# Patient Record
Sex: Female | Born: 1945
Health system: Southern US, Community
[De-identification: ages and names within clinical notes are randomized; demographics above are authoritative.]

## PROBLEM LIST (undated history)

## (undated) DIAGNOSIS — N6019 Diffuse cystic mastopathy of unspecified breast: Secondary | ICD-10-CM

## (undated) DIAGNOSIS — M25512 Pain in left shoulder: Secondary | ICD-10-CM

## (undated) DIAGNOSIS — E785 Hyperlipidemia, unspecified: Secondary | ICD-10-CM

## (undated) DIAGNOSIS — R221 Localized swelling, mass and lump, neck: Secondary | ICD-10-CM

## (undated) DIAGNOSIS — F32A Depression, unspecified: Secondary | ICD-10-CM

## (undated) DIAGNOSIS — K5730 Diverticulosis of large intestine without perforation or abscess without bleeding: Secondary | ICD-10-CM

## (undated) DIAGNOSIS — F329 Major depressive disorder, single episode, unspecified: Secondary | ICD-10-CM

## (undated) DIAGNOSIS — Z86711 Personal history of pulmonary embolism: Secondary | ICD-10-CM

## (undated) DIAGNOSIS — I10 Essential (primary) hypertension: Secondary | ICD-10-CM

## (undated) DIAGNOSIS — Z8619 Personal history of other infectious and parasitic diseases: Secondary | ICD-10-CM

## (undated) DIAGNOSIS — M1990 Unspecified osteoarthritis, unspecified site: Secondary | ICD-10-CM

## (undated) DIAGNOSIS — G4733 Obstructive sleep apnea (adult) (pediatric): Secondary | ICD-10-CM

## (undated) DIAGNOSIS — J9611 Chronic respiratory failure with hypoxia: Secondary | ICD-10-CM

## (undated) DIAGNOSIS — R911 Solitary pulmonary nodule: Secondary | ICD-10-CM

## (undated) DIAGNOSIS — N189 Chronic kidney disease, unspecified: Secondary | ICD-10-CM

## (undated) DIAGNOSIS — Z9981 Dependence on supplemental oxygen: Secondary | ICD-10-CM

## (undated) DIAGNOSIS — E669 Obesity, unspecified: Secondary | ICD-10-CM

## (undated) HISTORY — DX: Localized swelling, mass and lump, neck: R22.1

## (undated) HISTORY — DX: Chronic respiratory failure with hypoxia: J96.11

## (undated) HISTORY — DX: Solitary pulmonary nodule: R91.1

## (undated) HISTORY — DX: Dependence on supplemental oxygen: Z99.81

## (undated) HISTORY — DX: Obesity, unspecified: E66.9

## (undated) HISTORY — DX: Pain in left shoulder: M25.512

## (undated) HISTORY — DX: Chronic kidney disease, unspecified: N18.9

## (undated) HISTORY — DX: Personal history of pulmonary embolism: Z86.711

## (undated) HISTORY — PX: OTHER SURGICAL HISTORY: SHX169

## (undated) HISTORY — DX: Obstructive sleep apnea (adult) (pediatric): G47.33

## (undated) HISTORY — DX: Diverticulosis of large intestine without perforation or abscess without bleeding: K57.30

## (undated) HISTORY — DX: Essential (primary) hypertension: I10

## (undated) HISTORY — DX: Hyperlipidemia, unspecified: E78.5

## (undated) HISTORY — DX: Personal history of other infectious and parasitic diseases: Z86.19

## (undated) HISTORY — DX: Unspecified osteoarthritis, unspecified site: M19.90

## (undated) HISTORY — DX: Depression, unspecified: F32.A

## (undated) HISTORY — DX: Diffuse cystic mastopathy of unspecified breast: N60.19

## (undated) HISTORY — DX: Major depressive disorder, single episode, unspecified: F32.9

## (undated) SURGERY — SIGMOIDOSCOPY, FLEXIBLE

---

## 1997-09-28 ENCOUNTER — Other Ambulatory Visit: Admission: RE | Admit: 1997-09-28 | Discharge: 1997-09-28 | Payer: Self-pay | Admitting: *Deleted

## 1997-12-31 ENCOUNTER — Encounter: Payer: Self-pay | Admitting: *Deleted

## 1997-12-31 ENCOUNTER — Inpatient Hospital Stay (HOSPITAL_COMMUNITY): Admission: EM | Admit: 1997-12-31 | Discharge: 1998-01-02 | Payer: Self-pay | Admitting: Emergency Medicine

## 1998-01-26 ENCOUNTER — Emergency Department (HOSPITAL_COMMUNITY): Admission: EM | Admit: 1998-01-26 | Discharge: 1998-01-26 | Payer: Self-pay | Admitting: *Deleted

## 1998-01-26 ENCOUNTER — Encounter: Payer: Self-pay | Admitting: *Deleted

## 1998-04-30 ENCOUNTER — Ambulatory Visit (HOSPITAL_COMMUNITY): Admission: RE | Admit: 1998-04-30 | Discharge: 1998-04-30 | Payer: Self-pay | Admitting: *Deleted

## 1999-12-11 ENCOUNTER — Ambulatory Visit (HOSPITAL_COMMUNITY): Admission: RE | Admit: 1999-12-11 | Discharge: 1999-12-11 | Payer: Self-pay | Admitting: *Deleted

## 2001-10-28 ENCOUNTER — Inpatient Hospital Stay (HOSPITAL_COMMUNITY): Admission: EM | Admit: 2001-10-28 | Discharge: 2001-11-04 | Payer: Self-pay | Admitting: *Deleted

## 2001-11-05 ENCOUNTER — Other Ambulatory Visit (HOSPITAL_COMMUNITY): Admission: RE | Admit: 2001-11-05 | Discharge: 2001-11-10 | Payer: Self-pay | Admitting: *Deleted

## 2002-01-01 DIAGNOSIS — Z86711 Personal history of pulmonary embolism: Secondary | ICD-10-CM

## 2002-01-01 HISTORY — DX: Personal history of pulmonary embolism: Z86.711

## 2002-01-30 ENCOUNTER — Encounter: Payer: Self-pay | Admitting: Cardiology

## 2002-01-30 ENCOUNTER — Inpatient Hospital Stay (HOSPITAL_COMMUNITY): Admission: EM | Admit: 2002-01-30 | Discharge: 2002-02-08 | Payer: Self-pay

## 2002-02-01 ENCOUNTER — Encounter: Payer: Self-pay | Admitting: Internal Medicine

## 2002-02-02 ENCOUNTER — Encounter (INDEPENDENT_AMBULATORY_CARE_PROVIDER_SITE_OTHER): Payer: Self-pay | Admitting: Cardiology

## 2002-02-02 ENCOUNTER — Encounter: Payer: Self-pay | Admitting: Internal Medicine

## 2002-03-08 ENCOUNTER — Encounter: Admission: RE | Admit: 2002-03-08 | Discharge: 2002-06-06 | Payer: Self-pay | Admitting: Internal Medicine

## 2003-06-05 ENCOUNTER — Encounter: Admission: RE | Admit: 2003-06-05 | Discharge: 2003-06-05 | Payer: Self-pay | Admitting: Internal Medicine

## 2003-06-05 ENCOUNTER — Ambulatory Visit (HOSPITAL_COMMUNITY): Admission: RE | Admit: 2003-06-05 | Discharge: 2003-06-05 | Payer: Self-pay | Admitting: Internal Medicine

## 2003-06-12 ENCOUNTER — Encounter: Admission: RE | Admit: 2003-06-12 | Discharge: 2003-06-12 | Payer: Self-pay | Admitting: Internal Medicine

## 2003-06-26 ENCOUNTER — Encounter: Admission: RE | Admit: 2003-06-26 | Discharge: 2003-06-26 | Payer: Self-pay | Admitting: Internal Medicine

## 2003-07-17 ENCOUNTER — Encounter: Admission: RE | Admit: 2003-07-17 | Discharge: 2003-07-17 | Payer: Self-pay | Admitting: Internal Medicine

## 2003-08-14 ENCOUNTER — Encounter: Admission: RE | Admit: 2003-08-14 | Discharge: 2003-08-14 | Payer: Self-pay | Admitting: Internal Medicine

## 2003-09-07 ENCOUNTER — Encounter: Admission: RE | Admit: 2003-09-07 | Discharge: 2003-09-07 | Payer: Self-pay | Admitting: Internal Medicine

## 2003-10-05 ENCOUNTER — Encounter: Admission: RE | Admit: 2003-10-05 | Discharge: 2003-10-05 | Payer: Self-pay | Admitting: Internal Medicine

## 2003-10-25 ENCOUNTER — Encounter: Admission: RE | Admit: 2003-10-25 | Discharge: 2003-10-25 | Payer: Self-pay | Admitting: Internal Medicine

## 2003-11-10 ENCOUNTER — Ambulatory Visit: Payer: Self-pay | Admitting: Internal Medicine

## 2003-11-13 ENCOUNTER — Ambulatory Visit: Payer: Self-pay | Admitting: Internal Medicine

## 2003-11-15 ENCOUNTER — Ambulatory Visit: Payer: Self-pay | Admitting: Internal Medicine

## 2003-12-19 ENCOUNTER — Ambulatory Visit: Payer: Self-pay | Admitting: Internal Medicine

## 2004-01-15 ENCOUNTER — Ambulatory Visit: Payer: Self-pay | Admitting: Internal Medicine

## 2004-01-29 ENCOUNTER — Ambulatory Visit: Payer: Self-pay | Admitting: Internal Medicine

## 2004-03-18 ENCOUNTER — Ambulatory Visit: Payer: Self-pay | Admitting: Internal Medicine

## 2004-04-08 ENCOUNTER — Ambulatory Visit: Payer: Self-pay | Admitting: Internal Medicine

## 2004-05-06 ENCOUNTER — Ambulatory Visit: Payer: Self-pay | Admitting: Internal Medicine

## 2004-09-06 ENCOUNTER — Ambulatory Visit: Payer: Self-pay | Admitting: Internal Medicine

## 2004-09-20 ENCOUNTER — Ambulatory Visit: Payer: Self-pay | Admitting: Internal Medicine

## 2004-10-18 ENCOUNTER — Ambulatory Visit (HOSPITAL_COMMUNITY): Admission: RE | Admit: 2004-10-18 | Discharge: 2004-10-18 | Payer: Self-pay | Admitting: Obstetrics & Gynecology

## 2004-10-18 ENCOUNTER — Encounter (INDEPENDENT_AMBULATORY_CARE_PROVIDER_SITE_OTHER): Payer: Self-pay | Admitting: *Deleted

## 2004-11-06 ENCOUNTER — Ambulatory Visit (HOSPITAL_COMMUNITY): Admission: RE | Admit: 2004-11-06 | Discharge: 2004-11-06 | Payer: Self-pay | Admitting: Gastroenterology

## 2004-11-06 LAB — HM COLONOSCOPY

## 2005-04-11 ENCOUNTER — Emergency Department (HOSPITAL_COMMUNITY): Admission: EM | Admit: 2005-04-11 | Discharge: 2005-04-11 | Payer: Self-pay | Admitting: Emergency Medicine

## 2005-09-01 ENCOUNTER — Other Ambulatory Visit (HOSPITAL_COMMUNITY): Admission: RE | Admit: 2005-09-01 | Discharge: 2005-09-15 | Payer: Self-pay | Admitting: Psychiatry

## 2005-09-01 ENCOUNTER — Ambulatory Visit: Payer: Self-pay | Admitting: Psychiatry

## 2005-10-14 ENCOUNTER — Ambulatory Visit: Payer: Self-pay | Admitting: Internal Medicine

## 2005-10-22 ENCOUNTER — Ambulatory Visit: Payer: Self-pay | Admitting: Internal Medicine

## 2005-10-22 ENCOUNTER — Encounter (INDEPENDENT_AMBULATORY_CARE_PROVIDER_SITE_OTHER): Payer: Self-pay | Admitting: *Deleted

## 2006-01-19 DIAGNOSIS — I10 Essential (primary) hypertension: Secondary | ICD-10-CM | POA: Insufficient documentation

## 2006-01-19 DIAGNOSIS — Z8679 Personal history of other diseases of the circulatory system: Secondary | ICD-10-CM | POA: Insufficient documentation

## 2006-01-19 DIAGNOSIS — F329 Major depressive disorder, single episode, unspecified: Secondary | ICD-10-CM

## 2006-01-19 DIAGNOSIS — G4733 Obstructive sleep apnea (adult) (pediatric): Secondary | ICD-10-CM | POA: Insufficient documentation

## 2006-01-19 DIAGNOSIS — K5730 Diverticulosis of large intestine without perforation or abscess without bleeding: Secondary | ICD-10-CM | POA: Insufficient documentation

## 2006-01-19 DIAGNOSIS — Z6841 Body Mass Index (BMI) 40.0 and over, adult: Secondary | ICD-10-CM

## 2006-01-19 DIAGNOSIS — Z86718 Personal history of other venous thrombosis and embolism: Secondary | ICD-10-CM | POA: Insufficient documentation

## 2006-01-19 DIAGNOSIS — Z8619 Personal history of other infectious and parasitic diseases: Secondary | ICD-10-CM

## 2006-01-19 DIAGNOSIS — M1710 Unilateral primary osteoarthritis, unspecified knee: Secondary | ICD-10-CM

## 2006-01-19 DIAGNOSIS — N6019 Diffuse cystic mastopathy of unspecified breast: Secondary | ICD-10-CM

## 2006-01-19 HISTORY — DX: Morbid (severe) obesity due to excess calories: E66.01

## 2006-01-19 HISTORY — DX: Body Mass Index (BMI) 40.0 and over, adult: Z684

## 2006-01-19 HISTORY — DX: Personal history of other diseases of the circulatory system: Z86.79

## 2006-03-07 DIAGNOSIS — N182 Chronic kidney disease, stage 2 (mild): Secondary | ICD-10-CM

## 2006-03-07 DIAGNOSIS — E785 Hyperlipidemia, unspecified: Secondary | ICD-10-CM

## 2006-06-05 DIAGNOSIS — Z9181 History of falling: Secondary | ICD-10-CM

## 2006-06-18 ENCOUNTER — Telehealth: Payer: Self-pay | Admitting: *Deleted

## 2006-07-03 ENCOUNTER — Encounter (INDEPENDENT_AMBULATORY_CARE_PROVIDER_SITE_OTHER): Payer: Self-pay | Admitting: Internal Medicine

## 2006-07-03 ENCOUNTER — Ambulatory Visit: Payer: Self-pay | Admitting: *Deleted

## 2006-07-03 LAB — CONVERTED CEMR LAB
BUN: 21 mg/dL (ref 6–23)
CO2: 26 meq/L (ref 19–32)
Calcium: 8.9 mg/dL (ref 8.4–10.5)
Chloride: 105 meq/L (ref 96–112)
Creatinine, Ser: 1.15 mg/dL (ref 0.40–1.20)
Glucose, Bld: 125 mg/dL — ABNORMAL HIGH (ref 70–99)
Potassium: 3.7 meq/L (ref 3.5–5.3)
Sodium: 141 meq/L (ref 135–145)

## 2006-07-20 ENCOUNTER — Telehealth (INDEPENDENT_AMBULATORY_CARE_PROVIDER_SITE_OTHER): Payer: Self-pay | Admitting: *Deleted

## 2006-08-05 ENCOUNTER — Encounter (INDEPENDENT_AMBULATORY_CARE_PROVIDER_SITE_OTHER): Payer: Self-pay | Admitting: *Deleted

## 2006-08-05 ENCOUNTER — Ambulatory Visit: Payer: Self-pay | Admitting: Internal Medicine

## 2006-08-06 LAB — CONVERTED CEMR LAB
BUN: 13 mg/dL (ref 6–23)
CO2: 27 meq/L (ref 19–32)
Calcium: 9.3 mg/dL (ref 8.4–10.5)
Chloride: 101 meq/L (ref 96–112)
Cholesterol: 239 mg/dL — ABNORMAL HIGH (ref 0–200)
Creatinine, Ser: 1.16 mg/dL (ref 0.40–1.20)
Glucose, Bld: 115 mg/dL — ABNORMAL HIGH (ref 70–99)
HDL: 45 mg/dL (ref >39)
LDL Cholesterol: 163 mg/dL — ABNORMAL HIGH (ref 0–99)
Potassium: 3.3 meq/L — ABNORMAL LOW (ref 3.5–5.3)
Sodium: 140 meq/L (ref 135–145)
Total CHOL/HDL Ratio: 5.3
Triglycerides: 153 mg/dL — ABNORMAL HIGH (ref <150)
VLDL: 31 mg/dL (ref 0–40)

## 2006-08-07 ENCOUNTER — Ambulatory Visit: Payer: Self-pay | Admitting: Internal Medicine

## 2006-08-07 ENCOUNTER — Encounter (INDEPENDENT_AMBULATORY_CARE_PROVIDER_SITE_OTHER): Payer: Self-pay | Admitting: *Deleted

## 2006-08-07 LAB — CONVERTED CEMR LAB
CO2: 26 meq/L (ref 19–32)
Calcium: 8.7 mg/dL (ref 8.4–10.5)
Chloride: 102 meq/L (ref 96–112)
Creatinine, Ser: 1.04 mg/dL (ref 0.40–1.20)
Glucose, Bld: 96 mg/dL (ref 70–99)
Potassium: 3.3 meq/L — ABNORMAL LOW (ref 3.5–5.3)
Sodium: 143 meq/L (ref 135–145)

## 2006-09-24 ENCOUNTER — Ambulatory Visit: Payer: Self-pay | Admitting: Internal Medicine

## 2006-09-24 ENCOUNTER — Encounter (INDEPENDENT_AMBULATORY_CARE_PROVIDER_SITE_OTHER): Payer: Self-pay | Admitting: *Deleted

## 2006-09-24 ENCOUNTER — Ambulatory Visit (HOSPITAL_COMMUNITY): Admission: RE | Admit: 2006-09-24 | Discharge: 2006-09-24 | Payer: Self-pay | Admitting: *Deleted

## 2006-09-25 ENCOUNTER — Encounter (INDEPENDENT_AMBULATORY_CARE_PROVIDER_SITE_OTHER): Payer: Self-pay | Admitting: *Deleted

## 2006-09-25 ENCOUNTER — Ambulatory Visit (HOSPITAL_COMMUNITY): Admission: RE | Admit: 2006-09-25 | Discharge: 2006-09-25 | Payer: Self-pay | Admitting: *Deleted

## 2006-09-25 LAB — CONVERTED CEMR LAB
ALT: 27 units/L (ref 0–35)
Albumin: 4.4 g/dL (ref 3.5–5.2)
Alkaline Phosphatase: 113 units/L (ref 39–117)
BUN: 17 mg/dL (ref 6–23)
CO2: 24 meq/L (ref 19–32)
Calcium: 9.5 mg/dL (ref 8.4–10.5)
Creatinine, Ser: 1.43 mg/dL — ABNORMAL HIGH (ref 0.40–1.20)
Glucose, Bld: 108 mg/dL — ABNORMAL HIGH (ref 70–99)
Potassium: 4.1 meq/L (ref 3.5–5.3)
Sodium: 137 meq/L (ref 135–145)
TSH: 1.549 microintl units/mL (ref 0.350–5.50)
Total Bilirubin: 0.6 mg/dL (ref 0.3–1.2)
Total Protein: 7.6 g/dL (ref 6.0–8.3)

## 2006-10-26 ENCOUNTER — Ambulatory Visit: Payer: Self-pay | Admitting: Internal Medicine

## 2006-10-26 DIAGNOSIS — K219 Gastro-esophageal reflux disease without esophagitis: Secondary | ICD-10-CM | POA: Insufficient documentation

## 2006-11-23 ENCOUNTER — Ambulatory Visit: Payer: Self-pay | Admitting: Internal Medicine

## 2006-11-23 ENCOUNTER — Encounter (INDEPENDENT_AMBULATORY_CARE_PROVIDER_SITE_OTHER): Payer: Self-pay | Admitting: *Deleted

## 2006-11-23 LAB — CONVERTED CEMR LAB
Cholesterol: 169 mg/dL (ref 0–200)
HDL: 43 mg/dL (ref >39)
LDL Cholesterol: 110 mg/dL — ABNORMAL HIGH (ref 0–99)
Total CHOL/HDL Ratio: 3.9
Triglycerides: 80 mg/dL (ref <150)
VLDL: 16 mg/dL (ref 0–40)

## 2006-12-09 ENCOUNTER — Ambulatory Visit (HOSPITAL_COMMUNITY): Admission: RE | Admit: 2006-12-09 | Discharge: 2006-12-09 | Payer: Self-pay | Admitting: Infectious Diseases

## 2006-12-09 ENCOUNTER — Ambulatory Visit: Payer: Self-pay | Admitting: Infectious Diseases

## 2006-12-09 DIAGNOSIS — R229 Localized swelling, mass and lump, unspecified: Secondary | ICD-10-CM

## 2006-12-09 DIAGNOSIS — J984 Other disorders of lung: Secondary | ICD-10-CM

## 2007-03-12 ENCOUNTER — Telehealth (INDEPENDENT_AMBULATORY_CARE_PROVIDER_SITE_OTHER): Payer: Self-pay | Admitting: *Deleted

## 2007-03-31 ENCOUNTER — Ambulatory Visit (HOSPITAL_COMMUNITY): Admission: RE | Admit: 2007-03-31 | Discharge: 2007-03-31 | Payer: Self-pay | Admitting: Internal Medicine

## 2007-04-05 ENCOUNTER — Telehealth (INDEPENDENT_AMBULATORY_CARE_PROVIDER_SITE_OTHER): Payer: Self-pay | Admitting: *Deleted

## 2008-04-06 ENCOUNTER — Telehealth (INDEPENDENT_AMBULATORY_CARE_PROVIDER_SITE_OTHER): Payer: Self-pay | Admitting: *Deleted

## 2008-04-06 ENCOUNTER — Encounter (INDEPENDENT_AMBULATORY_CARE_PROVIDER_SITE_OTHER): Payer: Self-pay | Admitting: *Deleted

## 2008-04-06 ENCOUNTER — Ambulatory Visit: Payer: Self-pay | Admitting: Internal Medicine

## 2008-04-06 DIAGNOSIS — N3941 Urge incontinence: Secondary | ICD-10-CM

## 2008-04-06 LAB — CONVERTED CEMR LAB
Glucose, Urine, Semiquant: NEGATIVE
Ketones, urine, test strip: NEGATIVE

## 2008-04-07 ENCOUNTER — Encounter (INDEPENDENT_AMBULATORY_CARE_PROVIDER_SITE_OTHER): Payer: Self-pay | Admitting: *Deleted

## 2008-04-10 LAB — CONVERTED CEMR LAB
ALT: 40 units/L — ABNORMAL HIGH (ref 0–35)
AST: 35 units/L (ref 0–37)
Albumin: 4.2 g/dL (ref 3.5–5.2)
Alkaline Phosphatase: 94 units/L (ref 39–117)
BUN: 13 mg/dL (ref 6–23)
Bilirubin Urine: NEGATIVE
CO2: 23 meq/L (ref 19–32)
Calcium: 9.2 mg/dL (ref 8.4–10.5)
Chloride: 99 meq/L (ref 96–112)
Creatinine, Ser: 1.06 mg/dL (ref 0.40–1.20)
Glucose, Bld: 108 mg/dL — ABNORMAL HIGH (ref 70–99)
HCT: 45.6 % (ref 36.0–46.0)
Hemoglobin, Urine: NEGATIVE
Hemoglobin: 14.6 g/dL (ref 12.0–15.0)
Leukocytes, UA: NEGATIVE
MCHC: 32 g/dL (ref 30.0–36.0)
MCV: 82.3 fL (ref 78.0–100.0)
Nitrite: NEGATIVE
Platelets: 263 10*3/uL (ref 150–400)
Potassium: 3.2 meq/L — ABNORMAL LOW (ref 3.5–5.3)
Protein, ur: 30 mg/dL — AB
RBC / HPF: NONE SEEN (ref <3)
RBC: 5.54 M/uL — ABNORMAL HIGH (ref 3.87–5.11)
RDW: 14.4 % (ref 11.5–15.5)
Sodium: 141 meq/L (ref 135–145)
Specific Gravity, Urine: 1.028 (ref 1.005–1.03)
TSH: 2.106 microintl units/mL (ref 0.350–4.50)
Total Bilirubin: 0.7 mg/dL (ref 0.3–1.2)
Total Protein: 7.8 g/dL (ref 6.0–8.3)
Urine Glucose: NEGATIVE mg/dL
Urobilinogen, UA: 1 (ref 0.0–1.0)
WBC: 7.4 10*3/uL (ref 4.0–10.5)
pH: 7 (ref 5.0–8.0)

## 2008-04-11 ENCOUNTER — Telehealth (INDEPENDENT_AMBULATORY_CARE_PROVIDER_SITE_OTHER): Payer: Self-pay | Admitting: *Deleted

## 2008-04-20 ENCOUNTER — Encounter (INDEPENDENT_AMBULATORY_CARE_PROVIDER_SITE_OTHER): Payer: Self-pay | Admitting: *Deleted

## 2008-04-20 ENCOUNTER — Ambulatory Visit: Payer: Self-pay | Admitting: *Deleted

## 2008-04-20 LAB — CONVERTED CEMR LAB
BUN: 15 mg/dL (ref 6–23)
Blood Glucose, Fingerstick: 130
CO2: 25 meq/L (ref 19–32)
Calcium: 9.2 mg/dL (ref 8.4–10.5)
Chloride: 105 meq/L (ref 96–112)
Cholesterol: 184 mg/dL (ref 0–200)
Creatinine, Ser: 1.22 mg/dL — ABNORMAL HIGH (ref 0.40–1.20)
Glucose, Bld: 113 mg/dL — ABNORMAL HIGH (ref 70–99)
HDL: 42 mg/dL (ref >39)
LDL Cholesterol: 123 mg/dL — ABNORMAL HIGH (ref 0–99)
Magnesium: 2 mg/dL (ref 1.5–2.5)
Potassium: 4.3 meq/L (ref 3.5–5.3)
Sodium: 142 meq/L (ref 135–145)
Total CHOL/HDL Ratio: 4.4
Triglycerides: 97 mg/dL (ref <150)
VLDL: 19 mg/dL (ref 0–40)

## 2008-04-24 ENCOUNTER — Ambulatory Visit (HOSPITAL_COMMUNITY): Admission: RE | Admit: 2008-04-24 | Discharge: 2008-04-24 | Payer: Self-pay | Admitting: *Deleted

## 2008-05-19 ENCOUNTER — Ambulatory Visit: Payer: Self-pay | Admitting: Internal Medicine

## 2008-05-19 ENCOUNTER — Encounter (INDEPENDENT_AMBULATORY_CARE_PROVIDER_SITE_OTHER): Payer: Self-pay | Admitting: *Deleted

## 2008-05-19 LAB — CONVERTED CEMR LAB
BUN: 12 mg/dL (ref 6–23)
CO2: 25 meq/L (ref 19–32)
Calcium: 9.6 mg/dL (ref 8.4–10.5)
Chloride: 104 meq/L (ref 96–112)
Creatinine, Ser: 1.22 mg/dL — ABNORMAL HIGH (ref 0.40–1.20)
Glucose, Bld: 103 mg/dL — ABNORMAL HIGH (ref 70–99)
Potassium: 4 meq/L (ref 3.5–5.3)
Sodium: 141 meq/L (ref 135–145)

## 2008-06-07 ENCOUNTER — Encounter: Payer: Self-pay | Admitting: Licensed Clinical Social Worker

## 2008-06-12 ENCOUNTER — Encounter: Payer: Self-pay | Admitting: Licensed Clinical Social Worker

## 2008-09-01 ENCOUNTER — Emergency Department (HOSPITAL_COMMUNITY): Admission: EM | Admit: 2008-09-01 | Discharge: 2008-09-01 | Payer: Self-pay | Admitting: Emergency Medicine

## 2008-09-19 ENCOUNTER — Telehealth: Payer: Self-pay | Admitting: *Deleted

## 2008-12-28 ENCOUNTER — Telehealth: Payer: Self-pay | Admitting: Internal Medicine

## 2009-01-15 ENCOUNTER — Ambulatory Visit: Payer: Self-pay | Admitting: Internal Medicine

## 2009-01-16 LAB — CONVERTED CEMR LAB
BUN: 12 mg/dL (ref 6–23)
CO2: 26 meq/L (ref 19–32)
Calcium: 9.4 mg/dL (ref 8.4–10.5)
Chloride: 102 meq/L (ref 96–112)
Creatinine, Ser: 1.12 mg/dL (ref 0.40–1.20)
Glucose, Bld: 94 mg/dL (ref 70–99)
Potassium: 3.7 meq/L (ref 3.5–5.3)
Sodium: 142 meq/L (ref 135–145)

## 2009-04-10 ENCOUNTER — Ambulatory Visit: Payer: Self-pay | Admitting: Psychiatry

## 2009-04-10 ENCOUNTER — Inpatient Hospital Stay (HOSPITAL_COMMUNITY): Admission: RE | Admit: 2009-04-10 | Discharge: 2009-04-11 | Payer: Self-pay | Admitting: Psychiatry

## 2009-08-10 ENCOUNTER — Ambulatory Visit: Payer: Self-pay | Admitting: Internal Medicine

## 2009-08-10 ENCOUNTER — Ambulatory Visit (HOSPITAL_COMMUNITY): Admission: RE | Admit: 2009-08-10 | Discharge: 2009-08-10 | Payer: Self-pay | Admitting: Internal Medicine

## 2009-08-10 DIAGNOSIS — R040 Epistaxis: Secondary | ICD-10-CM

## 2009-08-10 DIAGNOSIS — R159 Full incontinence of feces: Secondary | ICD-10-CM | POA: Insufficient documentation

## 2009-08-10 LAB — CONVERTED CEMR LAB
ALT: 27 units/L (ref 0–35)
AST: 28 units/L (ref 0–37)
Albumin: 3.8 g/dL (ref 3.5–5.2)
Alkaline Phosphatase: 83 units/L (ref 39–117)
BUN: 13 mg/dL (ref 6–23)
Basophils Absolute: 0 10*3/uL (ref 0.0–0.1)
Basophils Relative: 0 % (ref 0–1)
CO2: 28 meq/L (ref 19–32)
Calcium: 9.3 mg/dL (ref 8.4–10.5)
Chloride: 104 meq/L (ref 96–112)
Cholesterol: 175 mg/dL (ref 0–200)
Creatinine, Ser: 1.14 mg/dL (ref 0.40–1.20)
Eosinophils Absolute: 0.1 10*3/uL (ref 0.0–0.7)
Eosinophils Relative: 2 % (ref 0–5)
Glucose, Bld: 96 mg/dL (ref 70–99)
HCT: 45.4 % (ref 36.0–46.0)
HDL: 43 mg/dL (ref >39)
Hemoglobin: 14.8 g/dL (ref 12.0–15.0)
INR: 1.03 (ref <1.50)
LDL Cholesterol: 112 mg/dL — ABNORMAL HIGH (ref 0–99)
Lymphocytes Relative: 24 % (ref 12–46)
Lymphs Abs: 1.3 10*3/uL (ref 0.7–4.0)
MCHC: 32.6 g/dL (ref 30.0–36.0)
MCV: 82.7 fL (ref >=78.0)
Monocytes Absolute: 0.3 10*3/uL (ref 0.1–1.0)
Monocytes Relative: 6 % (ref 3–12)
Neutro Abs: 3.8 10*3/uL (ref 1.7–7.7)
Neutrophils Relative %: 68 % (ref 43–77)
Platelets: 199 10*3/uL (ref 150–400)
Potassium: 3.7 meq/L (ref 3.5–5.3)
Prothrombin Time: 13.4 s (ref 11.6–15.2)
RBC: 5.49 M/uL — ABNORMAL HIGH (ref 3.87–5.11)
RDW: 14 % (ref 11.5–15.5)
Sodium: 138 meq/L (ref 135–145)
Total Bilirubin: 0.5 mg/dL (ref 0.3–1.2)
Total CHOL/HDL Ratio: 4.1
Total Protein: 7.6 g/dL (ref 6.0–8.3)
Triglycerides: 100 mg/dL (ref <150)
VLDL: 20 mg/dL (ref 0–40)
WBC: 5.5 10*3/uL (ref 4.0–10.5)
aPTT: 25 s (ref 24–37)

## 2009-08-10 LAB — HM MAMMOGRAPHY: HM Mammogram: NEGATIVE

## 2009-08-13 ENCOUNTER — Encounter: Payer: Self-pay | Admitting: Licensed Clinical Social Worker

## 2009-08-28 ENCOUNTER — Encounter: Payer: Self-pay | Admitting: Internal Medicine

## 2009-09-07 ENCOUNTER — Encounter: Payer: Self-pay | Admitting: Internal Medicine

## 2009-09-17 ENCOUNTER — Ambulatory Visit: Payer: Self-pay | Admitting: Internal Medicine

## 2009-10-01 ENCOUNTER — Encounter: Payer: Self-pay | Admitting: Internal Medicine

## 2009-11-26 ENCOUNTER — Ambulatory Visit: Payer: Self-pay | Admitting: Internal Medicine

## 2009-11-26 DIAGNOSIS — IMO0001 Reserved for inherently not codable concepts without codable children: Secondary | ICD-10-CM

## 2009-11-26 DIAGNOSIS — J309 Allergic rhinitis, unspecified: Secondary | ICD-10-CM | POA: Insufficient documentation

## 2009-11-26 LAB — CONVERTED CEMR LAB
BUN: 12 mg/dL (ref 6–23)
CO2: 27 meq/L (ref 19–32)
Calcium: 9.8 mg/dL (ref 8.4–10.5)
Chloride: 104 meq/L (ref 96–112)
Creatinine, Ser: 1.23 mg/dL — ABNORMAL HIGH (ref 0.40–1.20)
Glucose, Bld: 105 mg/dL — ABNORMAL HIGH (ref 70–99)
Hemoglobin, Urine: NEGATIVE
Leukocytes, UA: NEGATIVE
Nitrite: NEGATIVE
Potassium: 3.8 meq/L (ref 3.5–5.3)
Protein, ur: NEGATIVE mg/dL
Sed Rate: 9 mm/hr (ref 0–22)
Sodium: 143 meq/L (ref 135–145)
Specific Gravity, Urine: 1.025 (ref 1.005–1.0)
Urine Glucose: NEGATIVE mg/dL
Urobilinogen, UA: 1 (ref 0.0–1.0)
pH: 6 (ref 5.0–8.0)

## 2009-12-11 ENCOUNTER — Ambulatory Visit: Payer: Self-pay | Admitting: Internal Medicine

## 2009-12-11 ENCOUNTER — Ambulatory Visit (HOSPITAL_COMMUNITY): Admission: RE | Admit: 2009-12-11 | Discharge: 2009-12-11 | Payer: Self-pay | Admitting: Internal Medicine

## 2010-01-01 ENCOUNTER — Ambulatory Visit: Payer: Self-pay | Admitting: Internal Medicine

## 2010-01-01 ENCOUNTER — Encounter: Payer: Self-pay | Admitting: Internal Medicine

## 2010-01-01 LAB — CONVERTED CEMR LAB
OCCULT 1: NEGATIVE
OCCULT 2: NEGATIVE
OCCULT 3: NEGATIVE

## 2010-02-11 ENCOUNTER — Ambulatory Visit: Payer: Self-pay | Admitting: Internal Medicine

## 2010-03-24 ENCOUNTER — Encounter: Payer: Self-pay | Admitting: *Deleted

## 2010-04-02 NOTE — Assessment & Plan Note (Signed)
Summary: ACUTE-LEG/KNEE/HIP PAIN WORSE/(SAWHNEY   Vital Signs:  Patient profile:   65 year old female Height:      67.5 inches (171.45 cm) Weight:      355.02 pounds (161.37 kg) BMI:     54.98 Temp:     99.7 degrees F (37.61 degrees C) oral Pulse rate:   84 / minute BP sitting:   126 / 79  (right arm)  Vitals Entered By: Angelina Ok RN (December 11, 2009 9:31 AM) CC: Depression Is Patient Diabetic? No Pain Assessment Patient in pain? yes     Location: legs Intensity: 7 Type: aching Onset of pain  Constant Nutritional Status BMI of > 30 = obese  Have you ever been in a relationship where you felt threatened, hurt or afraid?No   Does patient need assistance? Functional Status Self care Ambulation Impaired:Risk for fall Comments Pain in legs.   Primary Care Provider:  Elyse Jarvis  CC:  Depression.  History of Present Illness: 1. bilateral knee pain for 2 weeks; worse in am upon awakening x 30-40 minutes; gets better with ROM. Hx of OA. No imaging done in the past. Denies any trauma; no nocturnal pain, fever or chills. Pain is 4/10 in intensity; improves with OTC ibuprofen. Patient reports that pain "starts" at both of her hip joints and radiates down to both knees; although right knee pain is worse than left one. 2. Depression. Did well with wellbutrin and cymbalta combination in the past. Sees a mental health specialist through [facility]. Denies SI, HI or mania.  Depression History:      The patient is having a depressed mood most of the day.        The patient denies that she feels like life is not worth living, denies that she wishes that she were dead, and denies that she has thought about ending her life.         Preventive Screening-Counseling & Management  Alcohol-Tobacco     Alcohol drinks/day: 0     Smoking Status: never  Caffeine-Diet-Exercise     Diet Counseling: to improve diet; diet is suboptimal     Does Patient Exercise: no  Exercise Counseling: to improve exercise regimen     Depression Counseling: further diagnostic testing and/or other treatment is indicated  Allergies: 1)  Lisinopril (Lisinopril)  Past History:  Past Medical History: Last updated: 04/06/2008 Depression foll. by Dr. Evelene Croon Diverticulosis, colon Hepatitis B, hx of Hypertension Osteoarthritis, knees Pulmonary embolism, hx of - 01/2002 Obesity Fibrocystic breast disease Obstructive sleep apnea, refuses CPAP Hyperlipidemia Renal insufficiency, creatinine 1.33 10/2005 nodule located at the base of posterior neck on the left side. (not painful, not erythematous, movable, likely cyst:  will need close follow up for changes.  Left shoulder pain secondary to fall.  Pulmonary nodule:  repeat CT showed resolution which indicated infectious vs inflammatory nodule.  urge incontinence  Past Surgical History: Last updated: 01/19/2006 GYN surgery, s/p excision of vulvar cyst 10/18/2004 by Dr. Antionette Char  Family History: Last updated: 01/19/2006 Family History Hypertension  Social History: Last updated: 01/19/2006 Divorced Former Smoker  Risk Factors: Smoking Status: never (12/11/2009)  Social History: Smoking Status:  never  Review of Systems       per HPI  Physical Exam  General:  Obese, Well-developed,well-nourished,in no acute distress; alert,appropriate and cooperative throughout examination Head:  no abnormalities observed.   Eyes:  vision grossly intact.   Mouth:  good dentition and pharynx pink and moist.  Neck:  No deformities, masses, or tenderness noted. Lungs:  Normal respiratory effort, chest expands symmetrically. Lungs are clear to auscultation, no crackles or wheezes. Heart:  Normal rate and regular rhythm. S1 and S2 normal without gallop, murmur, click, rub or other extra sounds. Abdomen:  soft, non-tender, and normal bowel sounds.   Msk:  Right knee TTP to medial infrapatellar  area; no crepitus; no  joint instability;no joint swelling, no joint warmth, no redness over joints, and no crepitation.  Left knee is unremarkable.  Pulses:  R and L carotid,radial,femoral,dorsalis pedis and posterior tibial pulses are full and equal bilaterally Extremities:  No clubbing, cyanosis, edema, or deformity noted with normal full range of motion of all joints.   Neurologic:  alert & oriented X3, strength normal in all extremities, gait normal, and DTRs symmetrical and normal.   Skin:  Intact without suspicious lesions or rashes Inguinal Nodes:  no R inguinal adenopathy and no L inguinal adenopathy.   Psych:  Oriented X3, normally interactive, good eye contact, and tearful.     Impression & Recommendations:  Problem # 1:  OSTEOARTHRITIS (ICD-715.90) Assessment Deteriorated  Discussed weight management. Explained that needs to decrease her BMI. Continue with Ibuprofen 200 mg by mouth q 6 hours as needed with meals. Will follow up with PT referral. Knee brace given. Her updated medication list for this problem includes:    Ibuprofen 200 Mg Tabs (Ibuprofen) .[name]... 1-2 tabs by mouth every 4-6 hours as needed  Orders: Radiology other (Radiology Other) Physical Therapy Referral (PT)  Discussed use of medications, application of heat or cold, and exercises.   Problem # 2:  DEPRESSION (ICD-311) Assessment: Deteriorated Per patient's report --tried different SSRI's and SNRI's. Combination of Wellbutrin and Cymbalta works the best. Unable to afford a co-payment for Cymbalta. Recieves samples of Cymblata through a mental health. Strongly encouraged to restart Cymbalta. Her updated medication list for this problem includes:    Wellbutrin Sr 200 Mg Xr12h-tab (Bupropion hcl) .[name]... Take 1 tablet by mouth each morning and one tablet by mouth at noon daily.    Alprazolam 1 Mg Tabs (Alprazolam) .[name]... Take 1 tablet by mouth up to 4 times daily.  Discussed treatment options, including trial of antidpressant  medication. Will refer to behavioral health. Follow-up call in in 24-48 hours and recheck in 2 weeks, sooner as needed. Patient agrees to call if any worsening of symptoms or thoughts of doing harm arise. Verified that the patient has no suicidal ideation at this time.   Problem # 3:  HYPERTENSION (ICD-401.9) Assessment: Unchanged Contorlled. Weight managment, diet discussed. Her updated medication list for this problem includes:    Hydrochlorothiazide 25 Mg Tabs (Hydrochlorothiazide) .[name]... Take 1 tablet by mouth once a day    Norvasc 5 Mg Tabs (Amlodipine besylate) .[name]... Take 1 tablet by mouth once a day  BP today: 126/79 Prior BP: 125/89 (11/26/2009)  Labs Reviewed: K+: 3.8 (11/26/2009) Creat: : 1.23 (11/26/2009)   Chol: 175 (08/10/2009)   HDL: 43 (08/10/2009)   LDL: 112 (08/10/2009)   TG: 100 (08/10/2009)  Complete Medication List: 1)  Wellbutrin Sr 200 Mg Xr12h-tab (Bupropion hcl) .... Take 1 tablet by mouth each morning and one tablet by mouth at noon daily. 2)  Alprazolam 1 Mg Tabs (Alprazolam) .... Take 1 tablet by mouth up to 4 times daily. 3)  Multivitamins Tabs (Multiple vitamin) .... Take 1 tablet by mouth once a day 4)  Pravachol 20 Mg Tabs (Pravastatin sodium) .... Take one pill  every night 5)  Hydrochlorothiazide 25 Mg Tabs (Hydrochlorothiazide) .... Take 1 tablet by mouth once a day 6)  Famotidine 20 Mg Tabs (Famotidine) .... Take 1 tablet by mouth two times a day 7)  Norvasc 5 Mg Tabs (Amlodipine besylate) .... Take 1 tablet by mouth once a day 8)  Oxybutynin Chloride 5 Mg Tabs (Oxybutynin chloride) .... Take 1 tablet by mouth two times a day 9)  Prilosec Otc 20 Mg Tbec (Omeprazole magnesium) .... Take 1 tablet by mouth once a day 10)  Allegra 180 Mg Tabs (Fexofenadine hcl) .... Take 1 tablet by mouth once a day 11)  Robinul-forte 2 Mg Tabs (Glycopyrrolate) .[name].. 1 tablet three times a day 12)  Ibuprofen 200 Mg Tabs (Ibuprofen) .[name].. 1-2 tabs by mouth every 4-6 hours as  needed  Other Orders: T-Hemoccult Card-Multiple (take home) (16109)  Patient Instructions: 1)  Please, follow up with XRays and physical therapy. 2)  Please, restart Cymbalta. 3)  If you feel like hurting yourslef or others-->please, call 911 or go to ER. 4)  Please, follow up with your doctor on November 1st as scheduled.  Prevention & Chronic Care Immunizations   Influenza vaccine: Fluvax MCR  (11/26/2009)   Influenza vaccine deferral: Not indicated  (12/11/2009)   Influenza vaccine due: 11/02/2010    Tetanus booster: 01/15/2009: Tdap   Tetanus booster due: 01/16/2019    Pneumococcal vaccine: Not documented    H. zoster vaccine: Not documented   H. zoster vaccine deferral: Not available  (12/11/2009)  Colorectal Screening   Hemoccult: Not documented   Hemoccult action/deferral: Ordered  (12/11/2009)    Colonoscopy: Excellent prep. Advanced to cecum. No findings other than moderate diverticulosis. Rec fu 10 years.   (11/06/2004)   Colonoscopy action/deferral: Not indicated  (12/11/2009)   Colonoscopy due: 11/07/2014  Other Screening   Pap smear: Not documented   Pap smear action/deferral: Ordered  (08/10/2009)   Pap smear due: 11/12/2009    Mammogram: ASSESSMENT: Negative - BI-RADS 1^MM DIGITAL SCREENING  (08/10/2009)   Mammogram action/deferral: Ordered  (08/10/2009)   Mammogram due: 08/10/2009    DXA bone density scan: Not documented   Smoking status: never  (12/11/2009)  Lipids   Total Cholesterol: 175  (08/10/2009)   Lipid panel action/deferral: Lipid Panel ordered   LDL: 112  (08/10/2009)   LDL Direct: Not documented   HDL: 43  (08/10/2009)   Triglycerides: 100  (08/10/2009)   Lipid panel due: 12/12/2010    SGOT (AST): 28  (08/10/2009)   BMP action: Ordered   SGPT (ALT): 27  (08/10/2009)   Alkaline phosphatase: 83  (08/10/2009)   Total bilirubin: 0.5  (08/10/2009)   Liver panel due: 08/11/2010    Lipid flowsheet reviewed?: Yes   Progress toward  LDL goal: Unchanged    Stage of readiness to change (lipid management): Maintenance  Hypertension   Last Blood Pressure: 126 / 79  (12/11/2009)   Serum creatinine: 1.23  (11/26/2009)   BMP action: Ordered   Serum potassium 3.8  (11/26/2009)   Basic metabolic panel due: 06/12/2010    Hypertension flowsheet reviewed?: Yes   Progress toward BP goal: At goal    Stage of readiness to change (hypertension management): Maintenance  Self-Management Support :   Personal Goals (by the next clinic visit) :      Personal blood pressure goal: 140/90  (08/10/2009)     Personal LDL goal: 100  (08/10/2009)    Patient will work on the following items until the next  clinic visit to reach self-care goals:     Medications and monitoring: take my medicines every day, bring all of my medications to every visit  (12/11/2009)     Eating: drink diet soda or water instead of juice or soda, eat more vegetables, use fresh or frozen vegetables, eat foods that are low in salt, eat baked foods instead of fried foods, eat fruit for snacks and desserts, limit or avoid alcohol  (12/11/2009)     Activity: take a 30 minute walk every day  (12/11/2009)    Hypertension self-management support: Written self-care plan, Education handout, Pre-printed educational material, Resources for patients handout  (12/11/2009)   Hypertension self-care plan printed.   Hypertension education handout printed    Lipid self-management support: Written self-care plan, Education handout, Pre-printed educational material, Resources for patients handout  (12/11/2009)   Lipid self-care plan printed.   Lipid education handout printed      Resource handout printed.   Nursing Instructions: Provide Hemoccult cards with instructions (see order)     Vital Signs:  Patient profile:   65 year old female Height:      67.5 inches (171.45 cm) Weight:      355.02 pounds (161.37 kg) BMI:     54.98 Temp:     99.7 degrees F (37.61 degrees C)  oral Pulse rate:   84 / minute BP sitting:   126 / 79  (right arm)  Vitals Entered By: Angelina Ok RN (December 11, 2009 9:31 AM)

## 2010-04-02 NOTE — Consult Note (Signed)
Summary: [facility]   Imported By: Louretta Parma 10/11/2009 09:55:58  _____________________________________________________________________  External Attachment:    Type:   Image     Comment:   External Document

## 2010-04-02 NOTE — Miscellaneous (Signed)
Summary: Social Work  Designer, multimedia.  40 minutes. Established relationship with patient.   [name] explained to me that she has not been able to find a therapist who will spend the time she needs to disclose her issues.  Right now she is trying to hang on to her main home but her brother who co-owns the house with her is trying to sell the home.   She is highly distressed and tearful about this.   She also reports having many appointments (eye, colon, dental etc.) that she is trying to keep up with.   She did participate in IOP therapy back in 2003 and really liked it thru [facility].  She attemped to reconnect there but found that the program was not the same.   [name] tells me she has found a representative who is going to help her with trying to keep her home and that is her main concern right now.   [name] took my phone number and will call me in the future when she doesn't feel so overwhelmed with issues.  I encouraged her to call me for problem-solving or resources.  I also encouraged her to not give up about finding a therapist that certainly there will be someone she can connect with and feel good about seeing.  [name] continues to see Dr. Evelene Croon for psychiatry.   SW as needed.

## 2010-04-02 NOTE — Consult Note (Signed)
[facility]   Imported By: Margie Billet 09/06/2009 10:46:35  _____________________________________________________________________  External Attachment:    Type:   Image     Comment:   External Document

## 2010-04-02 NOTE — Assessment & Plan Note (Signed)
Summary: Hemoccult Results   Laboratory Results  Date/Time Received: January 01, 2010 9:41 AM Date/Time Reported: Alric Quan  January 01, 2010 9:41 AM   Stool - Occult Blood Hemmoccult #1: negative Hemoccult #2: negative Hemoccult #3: negative Comments: Patient did not provide collection dates  Alric Quan  January 01, 2010 9:41 AM   Kit Test Internal QC: Positive   (Normal Range: Negative)

## 2010-04-02 NOTE — Assessment & Plan Note (Signed)
Summary: COUGHING UP BLOOD / (WHITWORTH) SB.   Vital Signs:  Patient profile:   65 year old female Height:      67.5 inches (171.45 cm) Weight:      364.7 pounds (165.77 kg) BMI:     56.48 Temp:     97.7 degrees F oral Pulse rate:   80 / minute BP sitting:   125 / 79  (left arm)  Vitals Entered By: Chinita Pester RN (August 10, 2009 10:09 AM) CC: Check-up. "Crusty" sores in nose. Coughed up blood sometimes in the mornigs. Occasioal sorethroat. Ankles are swollen.  Stool incontinence sometimes. Eye referral., Depression. Reflux med. is not working. Is Patient Diabetic? No Pain Assessment Patient in pain? yes     Location: tooth Type: aching Onset of pain  Intermittent; had wisdom tooth pulled. Nutritional Status BMI of > 30 = obese  Have you ever been in a relationship where you felt threatened, hurt or afraid?Unable to ask   Does patient need assistance? Functional Status Self care Ambulation Normal   Primary Care Provider:  Aris Lot MD  CC:  Check-up. "Crusty" sores in nose. Coughed up blood sometimes in the mornigs. Occasioal sorethroat. Ankles are swollen.  Stool incontinence sometimes. Eye referral. and Depression. Reflux med. is not working.[name]  History of Present Illness: [name] is a 65 yo woman with PMH as outlined in chart.  She is here today with multiple complaints.  1. Crusty sores in her nose.  Picks them but return and has tried ointments.  Occasional blood when blowing her nose.  2.  Coughing up blood.  Started with "cold" about 2 months ago.  Cough is mainly in the morning, feels like its something in her throat that she has to clear.  Sometimes its mucuous, others has some blood.    3.  Occasional sore throat.  4.  Swelling of ankles.  States she was on a different water pill in the past that made her urinate lots, this one doesn't  5.  Ocassional bowel incontinence.  Reports she thinks she has to pass some gas and dirties her underwear.  6.   Reports refulx medicine isn't quite working, especially if she eats too many brownies or fried food.  Resolves with baking soda.  Depression History:      The patient denies a depressed mood most of the day and a diminished interest in her usual daily activities.        Comments:  States depression is better.   Preventive Screening-Counseling & Management  Alcohol-Tobacco     Alcohol drinks/day: 0     Smoking Status: quit     Year Quit: 1997 or earlier - pt cannot remember when     Pack years: 1PPD x 22 years  Caffeine-Diet-Exercise     Does Patient Exercise: no  Current Medications (verified): 1)  Wellbutrin Sr 200 Mg Xr12h-Tab (Bupropion Hcl) .... Take 1 Tablet By Mouth Each Morning and One Tablet By Mouth At [facility] Daily. 2)  Alprazolam 1 Mg Tabs (Alprazolam) .... Take 1 Tablet By Mouth Up To 4 Times Daily. 3)  Multivitamins  Tabs (Multiple Vitamin) .... Take 1 Tablet By Mouth Once A Day 4)  Pravachol 20 Mg  Tabs (Pravastatin Sodium) .... Take One Pill Every Night 5)  Hydrochlorothiazide 25 Mg  Tabs (Hydrochlorothiazide) .... Take 1 Tablet By Mouth Once A Day 6)  Famotidine 20 Mg  Tabs (Famotidine) .... Take 1 Tablet By Mouth Two Times A Day 7)  Norvasc 5 Mg  Tabs (Amlodipine Besylate) .... Take 1 Tablet By Mouth Once A Day 8)  Oxybutynin Chloride 5 Mg Tabs (Oxybutynin Chloride) .... Take 1 Tablet By Mouth Two Times A Day  Allergies (verified): 1)  Lisinopril (Lisinopril)  Past History:  Past Medical History: Last updated: 04/06/2008 Depression foll. by Dr. Evelene Croon Diverticulosis, colon Hepatitis B, hx of Hypertension Osteoarthritis, knees Pulmonary embolism, hx of - 01/2002 Obesity Fibrocystic breast disease Obstructive sleep apnea, refuses CPAP Hyperlipidemia Renal insufficiency, creatinine 1.33 10/2005 nodule located at the base of posterior neck on the left side. (not painful, not erythematous, movable, likely cyst:  will need close follow up for changes.  Left  shoulder pain secondary to fall.  Pulmonary nodule:  repeat CT showed resolution which indicated infectious vs inflammatory nodule.  urge incontinence  Past Surgical History: Last updated: 01/19/2006 GYN surgery, s/p excision of vulvar cyst 10/18/2004 by Dr. Antionette Char  Family History: Last updated: 01/19/2006 Family History Hypertension  Social History: Last updated: 01/19/2006 Divorced Former Smoker  Risk Factors: Alcohol Use: 0 (08/10/2009) Exercise: no (08/10/2009)  Risk Factors: Smoking Status: quit (08/10/2009)  Review of Systems      See HPI  Physical Exam  General:  alert, obese, well-nourished, and well-hydrated.   Eyes:  vision grossly intact, pupils equal, pupils round, and pupils reactive to light.   Nose:  some areas of white, adherent appearing plaques noted on nasal mucosa.  Mucous present with dark blood. Mouth:  pharynx pink and moist.  no lesions Lungs:  normal respiratory effort, no accessory muscle use, normal breath sounds, no crackles, and no wheezes.   Heart:  normal rate, regular rhythm, and no murmur.  distant. Abdomen:  normal bowel sounds.   Extremities:  bilateral edema Neurologic:  alert & oriented X3, cranial nerves II-XII intact, and gait normal.   Psych:  clearly depressed, easily tearful.  no SI/HI Oriented X3 and memory intact for recent and remote.     Impression & Recommendations:  Problem # 1:  LUNG NODULE (ICD-518.89) Reviewed.  Discussed with pt at length No further w/u  greater than 40 minutes face to face discussing all issues at hand.  Problem # 2:  GERD (ICD-530.81) will add PPI  Her updated medication list for this problem includes:    Famotidine 20 Mg Tabs (Famotidine) .[name]... Take 1 tablet by mouth two times a day    Prilosec Otc 20 Mg Tbec (Omeprazole magnesium) .[name]... Take 1 tablet by mouth once a day  Problem # 3:  EPISTAXIS (ICD-784.7)  This is the source of both nose bleed and hemoptysis Pt does  have some white plaques in nostrils, unclear etiology Will refer to ENT for further eval, would want to avoid nasal sprays and antihistamines as dryness may worsen nose bleed.  will check CBC and coags  Orders: T-CBC w/Diff ([id number]) T-Protime, Auto ([id number]) T-PTT ([id number]) ENT Referral (ENT)  Problem # 4:  HYPERTENSION (ICD-401.9) at goal no change will check blood work  Her updated medication list for this problem includes:    Hydrochlorothiazide 25 Mg Tabs (Hydrochlorothiazide) .[name]... Take 1 tablet by mouth once a day    Norvasc 5 Mg Tabs (Amlodipine besylate) .[name]... Take 1 tablet by mouth once a day  BP today: 125/79 Prior BP: 120/76 (01/15/2009)  Labs Reviewed: K+: 3.7 (01/15/2009) Creat: : 1.12 (01/15/2009)   Chol: 184 (04/20/2008)   HDL: 42 (04/20/2008)   LDL: 123 (04/20/2008)   TG: 97 (04/20/2008)  Problem # 5:  HYPERLIPIDEMIA (ICD-272.4)  will recheck today  Her updated medication list for this problem includes:    Pravachol 20 Mg Tabs (Pravastatin sodium) .[name]... Take one pill every night  Labs Reviewed: SGOT: 35 (04/07/2008)   SGPT: 40 (04/07/2008)   HDL:42 (04/20/2008), 43 (11/23/2006)  LDL:123 (04/20/2008), 110 (11/23/2006)  Chol:184 (04/20/2008), 169 (11/23/2006)  Trig:97 (04/20/2008), 80 (11/23/2006)  Orders: T-Lipid Profile ([id number])  Problem # 6:  DEPRESSION (ICD-311)  seems severe no SI/HI follows with psychiatrist offerred SW consult given multitude of psychosocial stressors  Her updated medication list for this problem includes:    Wellbutrin Sr 200 Mg Xr12h-tab (Bupropion hcl) .[name]... Take 1 tablet by mouth each morning and one tablet by mouth at noon daily.    Alprazolam 1 Mg Tabs (Alprazolam) .[name]... Take 1 tablet by mouth up to 4 times daily.  Orders: Social Work Referral (Social )  Problem # 7:  PREVENTIVE HEALTH CARE (ICD-V70.0)  mammo today pap 11/12/09 will refer to eye doctor for routine eye exam  Orders: Ophthalmology  Referral (Ophthalmology)  Problem # 8:  ENCOPRESIS (ICD-307.7) No diarrhea or symptoms to suggest infection/inflammatory process Normal colonoscopy 2006 will refer to GI for further assessment  Orders: Gastroenterology Referral (GI)  Complete Medication List: 1)  Wellbutrin Sr 200 Mg Xr12h-tab (Bupropion hcl) .... Take 1 tablet by mouth each morning and one tablet by mouth at noon daily. 2)  Alprazolam 1 Mg Tabs (Alprazolam) .... Take 1 tablet by mouth up to 4 times daily. 3)  Multivitamins Tabs (Multiple vitamin) .... Take 1 tablet by mouth once a day 4)  Pravachol 20 Mg Tabs (Pravastatin sodium) .... Take one pill every night 5)  Hydrochlorothiazide 25 Mg Tabs (Hydrochlorothiazide) .... Take 1 tablet by mouth once a day 6)  Famotidine 20 Mg Tabs (Famotidine) .... Take 1 tablet by mouth two times a day 7)  Norvasc 5 Mg Tabs (Amlodipine besylate) .... Take 1 tablet by mouth once a day 8)  Oxybutynin Chloride 5 Mg Tabs (Oxybutynin chloride) .... Take 1 tablet by mouth two times a day 9)  Prilosec Otc 20 Mg Tbec (Omeprazole magnesium) .... Take 1 tablet by mouth once a day  Other Orders: T-Comprehensive Metabolic Panel ([id number])  Patient Instructions: 1)  Please schedule a follow-up appointment in 1 month. 2)  Continue medications below 3)  will refer to ENT for nose bleeds 4)  will refer to GI for problem with stool 5)  put in referral for eye doctor 6)  have sent in new prescription for omeprazole for your reflux 7)  do not need to follow up the lung nodule as we discussed 8)  Limit your Sodium (Salt) to less than 2 grams a day(slightly less than 1/2 a teaspoon) to prevent fluid retention, swelling, or worsening of symptoms. 9)  if you have any problems before our next visit, call clinic.  Prescriptions: PRILOSEC OTC 20 MG TBEC (OMEPRAZOLE MAGNESIUM) Take 1 tablet by mouth once a day  #30 x 3   Entered and Authorized by:   Patricia Stable MD   Signed by:   Patricia Stable  MD on 08/10/2009   Method used:   Electronically to        [facility]  [address] [phone]* (retail)       [address]       [place], [place]  [address]       Ph: [phone] or [phone]  Fax: [phone]   RxID:   [id number]   Prevention & Chronic Care Immunizations   Influenza vaccine: Fluvax 3+  (01/15/2009)   Influenza vaccine deferral: Not available  (08/10/2009)    Tetanus booster: 01/15/2009: Tdap    Pneumococcal vaccine: Not documented    H. zoster vaccine: Not documented  Colorectal Screening   Hemoccult: Not documented    Colonoscopy: Excellent prep. Advanced to cecum. No findings other than moderate diverticulosis. Rec fu 10 years.   (11/06/2004)  Other Screening   Pap smear: Not documented   Pap smear action/deferral: Ordered  (08/10/2009)   Pap smear due: 11/12/2009    Mammogram: Normal  (10/13/2005)   Mammogram action/deferral: Ordered  (08/10/2009)   Mammogram due: 08/10/2009    DXA bone density scan: Not documented   Smoking status: quit  (08/10/2009)  Lipids   Total Cholesterol: 184  (04/20/2008)   Lipid panel action/deferral: Lipid Panel ordered   LDL: 123  (04/20/2008)   LDL Direct: Not documented   HDL: 42  (04/20/2008)   Triglycerides: 97  (04/20/2008)    SGOT (AST): 35  (04/07/2008)   BMP action: Ordered   SGPT (ALT): 40  (04/07/2008) CMP ordered    Alkaline phosphatase: 94  (04/07/2008)   Total bilirubin: 0.7  (04/07/2008)    Lipid flowsheet reviewed?: Yes   Progress toward LDL goal: Unchanged  Hypertension   Last Blood Pressure: 125 / 79  (08/10/2009)   Serum creatinine: 1.12  (01/15/2009)   BMP action: Ordered   Serum potassium 3.7  (01/15/2009) CMP ordered     Hypertension flowsheet reviewed?: Yes   Progress toward BP goal: At goal  Self-Management Support :   Personal Goals (by the next clinic visit) :      Personal blood pressure goal: 140/90  (08/10/2009)     Personal LDL goal: 100   (08/10/2009)    Patient will work on the following items until the next clinic visit to reach self-care goals:     Medications and monitoring: take my medicines every day, check my blood pressure, bring all of my medications to every visit  (08/10/2009)     Eating: use fresh or frozen vegetables, eat foods that are low in salt  (08/10/2009)     Activity: take a 30 minute walk every day  (08/10/2009)    Hypertension self-management support: Education handout, Resources for patients handout, Written self-care plan  (08/10/2009)   Hypertension self-care plan printed.   Hypertension education handout printed    Lipid self-management support: Education handout, Resources for patients handout, Written self-care plan  (08/10/2009)   Lipid self-care plan printed.   Lipid education handout printed      Resource handout printed.  Process Orders Check Orders Results:     [facility]: ABN not required for this insurance Tests Sent for requisitioning (August 10, 2009 11:25 AM):     08/10/2009: [facility] -- T-Lipid Profile [phone] (signed)     08/10/2009: [facility] -- T-Comprehensive Metabolic Panel [[id number]] (signed)     08/10/2009: [facility] -- T-CBC w/Diff [[id number]] (signed)     08/10/2009: [facility] -- T-Protime, Auto [[id number]] (signed)     08/10/2009: [facility] -- T-PTT [[id number]] (signed)

## 2010-04-02 NOTE — Assessment & Plan Note (Signed)
Summary: EST-CK/FU/MEDS/CFB   Vital Signs:  Patient profile:   65 year old female Height:      67.5 inches (171.45 cm) Weight:      347.3 pounds (157.86 kg) BMI:     53.79 Temp:     97.5 degrees F (36.39 degrees C) oral Pulse rate:   70 / minute BP sitting:   125 / 86  (right arm)  Vitals Entered By: Stanton Kidney Ditzler RN (January 01, 2010 4:03 PM) Is Patient Diabetic? No Pain Assessment Patient in pain? yes     Location: left side Intensity: 10 Type: sharp Onset of pain  past 2 days Nutritional Status BMI of > 30 = obese Nutritional Status Detail appetite ok  Have you ever been in a relationship where you felt threatened, hurt or afraid?denies   Does patient need assistance? Functional Status Self care Ambulation Normal Comments FU on x-rays - knees sl better. Pain in left side area x 2 days.   Primary Care Provider:  Elyse Jarvis   History of Present Illness: 65 y/o woman with PMH significant for OA, presented with the chief complaint of  new onset left sided abdominal pain for one day. The pain started yesterday after she slept on her left side. It was primarily located in the midaxillary line on the left side , described as achy kind of discomfort,10/10 with no radiation. She took two ibuprofen yesterday which did not make her pain better. Otherwise she denies any nausea, vomiting, dysuria or urgency. Differentials for this pain include muskuloskeletal vs pyelonephritis (less likely in the absence of fever, flank pain, dysuria or absent CVA tenderness) vs compression radiculopathy(less likey in the absence of radiation to her legs). Most likey her pain is muskulosketal in origin. Also she complained of the chronic b/l knee pain that has been ongoing for several years now.  Depression History:      The patient denies a depressed mood most of the day and a diminished interest in her usual daily activities.        Comments:  She says that she has a history of depression fo the  last 5 years but denies any depressed mood, SI, HI at this point. The medicines are helping her.   Preventive Screening-Counseling & Management  Alcohol-Tobacco     Alcohol drinks/day: 0     Smoking Status: never     Year Quit: 1997 or earlier - pt cannot remember when     Pack years: 1PPD x 22 years  Caffeine-Diet-Exercise     Diet Counseling: to improve diet; diet is suboptimal     Does Patient Exercise: no     Exercise Counseling: to improve exercise regimen     Depression Counseling: further diagnostic testing and/or other treatment is indicated  Current Medications (verified): 1)  Wellbutrin Sr 200 Mg Xr12h-Tab (Bupropion Hcl) .... Take 1 Tablet By Mouth Each Morning and One Tablet By Mouth At [facility] Daily. 2)  Alprazolam 1 Mg Tabs (Alprazolam) .... Take 1 Tablet By Mouth Up To 4 Times Daily. 3)  Multivitamins  Tabs (Multiple Vitamin) .... Take 1 Tablet By Mouth Once A Day 4)  Pravachol 20 Mg  Tabs (Pravastatin Sodium) .... Take One Pill Every Night 5)  Hydrochlorothiazide 25 Mg  Tabs (Hydrochlorothiazide) .... Take 1 Tablet By Mouth Once A Day 6)  Famotidine 20 Mg  Tabs (Famotidine) .... Take 1 Tablet By Mouth Two Times A Day 7)  Norvasc 5 Mg  Tabs (Amlodipine Besylate) .... Take  1 Tablet By Mouth Once A Day 8)  Oxybutynin Chloride 5 Mg Tabs (Oxybutynin Chloride) .... Take 1 Tablet By Mouth Two Times A Day 9)  Prilosec Otc 20 Mg Tbec (Omeprazole Magnesium) .... Take 1 Tablet By Mouth Once A Day 10)  Allegra 180 Mg Tabs (Fexofenadine Hcl) .... Take 1 Tablet By Mouth Once A Day 11)  Robinul-Forte 2 Mg Tabs (Glycopyrrolate) .[name].. 1 Tablet Three Times A Day 12)  Ibuprofen 200 Mg Tabs (Ibuprofen) .[name].. 1-2 Tabs By Mouth Every 4-6 Hours As Needed 13)  Tramadol Hcl 100 Mg Xr24h-Tab (Tramadol Hcl) .... Take 1 Tablet By Mouth Once Daily  Allergies: 1)  Lisinopril (Lisinopril)  Review of Systems      See HPI  Physical Exam  General:  alert, well-developed, and well-nourished.   Head:   normocephalic and atraumatic.   Eyes:  vision grossly intact, pupils equal, pupils round, and pupils reactive to light.   Neck:  supple and full ROM.   Lungs:  normal respiratory effort, no intercostal retractions, no accessory muscle use, normal breath sounds, no dullness, no fremitus, no crackles, and no wheezes.   Heart:  normal rate, regular rhythm, no murmur, no gallop, and no rub.   Abdomen:  soft, non-tender, no masses, no guarding, no rigidity, and no rebound tenderness.   Msk:  normal ROM, no joint tenderness, no joint swelling, no joint warmth, and no redness over joints.   Pulses:  R radial normal, R popliteal normal, R posterior tibial normal, R dorsalis pedis normal, L popliteal normal, L posterior tibial normal, and L dorsalis pedis normal.   Extremities:  No cyanosis, clubbing. Neurologic:  alert & oriented X3, cranial nerves II-XII intact, strength normal in all extremities, sensation intact to light touch, sensation intact to pinprick, gait normal, and DTRs symmetrical and normal.     Impression & Recommendations:  Problem # 1:  ABDOMINAL PAIN, UNSPECIFIED SITE (ICD-789.00) Assessment New She describes a new onset abdominal pain on the left side in the mid axillary line since yesterday., which is most likely muskuloskeletal in origin. Other differentials include pyelonephrits or compression radiculpathy which are very less likely in the absence of flank pain, fever, nausea, vomiting or radiation of pain to the legs. Since ibuprofen is not helping allevaiting her symptoms, we would like to add tramadol to her current pain regimen that would also help her with her chronic knee pain and would reasses in one month.  Problem # 2:  OSTEOARTHRITIS (ICD-715.90) Assessment: Unchanged She complained of chronic b/l knee pain. We discussed the results of the imaging consistent with OA changes ,done during  the last visit.We encouraged her to exercise and loose weight that would help  allevaiting her symptoms. She seems highly motivated at this time. She was referred to the physical therapy during the last visit but did not follow with  her appointment. Sometimes her pain is not relieved with NSAIDS, so tramadol was added. Her updated medication list for this problem includes:    Ibuprofen 200 Mg Tabs (Ibuprofen) .[name]... 1-2 tabs by mouth every 4-6 hours as needed    Tramadol Hcl 100 Mg Xr24h-tab (Tramadol hcl) .[name]... Take 1 tablet by mouth once daily  Problem # 3:  DEPRESSION (ICD-311) Assessment: Unchanged She denies depressed mood, SI, HI. Her updated medication list for this problem includes:    Wellbutrin Sr 200 Mg Xr12h-tab (Bupropion hcl) .[name]... Take 1 tablet by mouth each morning and one tablet by mouth at noon daily.  Alprazolam 1 Mg Tabs (Alprazolam) .[name]... Take 1 tablet by mouth up to 4 times daily.  Complete Medication List: 1)  Wellbutrin Sr 200 Mg Xr12h-tab (Bupropion hcl) .... Take 1 tablet by mouth each morning and one tablet by mouth at noon daily. 2)  Alprazolam 1 Mg Tabs (Alprazolam) .... Take 1 tablet by mouth up to 4 times daily. 3)  Multivitamins Tabs (Multiple vitamin) .... Take 1 tablet by mouth once a day 4)  Pravachol 20 Mg Tabs (Pravastatin sodium) .... Take one pill every night 5)  Hydrochlorothiazide 25 Mg Tabs (Hydrochlorothiazide) .... Take 1 tablet by mouth once a day 6)  Famotidine 20 Mg Tabs (Famotidine) .... Take 1 tablet by mouth two times a day 7)  Norvasc 5 Mg Tabs (Amlodipine besylate) .... Take 1 tablet by mouth once a day 8)  Oxybutynin Chloride 5 Mg Tabs (Oxybutynin chloride) .... Take 1 tablet by mouth two times a day 9)  Prilosec Otc 20 Mg Tbec (Omeprazole magnesium) .... Take 1 tablet by mouth once a day 10)  Allegra 180 Mg Tabs (Fexofenadine hcl) .... Take 1 tablet by mouth once a day 11)  Robinul-forte 2 Mg Tabs (Glycopyrrolate) .[name].. 1 tablet three times a day 12)  Ibuprofen 200 Mg Tabs (Ibuprofen) .[name].. 1-2 tabs by mouth every  4-6 hours as needed 13)  Tramadol Hcl 100 Mg Xr24h-tab (Tramadol hcl) .... Take 1 tablet by mouth once daily  Patient Instructions: 1)  Please take your medicines as prescribed. 2)  Please schedule a follow-up appointment in 1 month. 3)  It is important that you exercise regularly at least 20 minutes 5 times a week. If you develop chest pain, have severe difficulty breathing, or feel very tired , stop exercising immediately and seek medical attention. Prescriptions: TRAMADOL HCL 100 MG XR24H-TAB (TRAMADOL HCL) take 1 tablet by mouth once daily  #29 x 0   Entered and Authorized by:   Elyse Jarvis   Signed by:   Elyse Jarvis on 01/01/2010   Method used:   Electronically to        [facility] [address] [phone]* (retail)       [address]       [place], [place]  [address]       Ph: [phone] or [phone]       Fax: [phone]   RxID:   [id number]    Orders Added: 1)  Est. Patient Level III [36644]    Prevention & Chronic Care Immunizations   Influenza vaccine: Fluvax MCR  (11/26/2009)   Influenza vaccine deferral: Not indicated  (12/11/2009)   Influenza vaccine due: 11/02/2010    Tetanus booster: 01/15/2009: Tdap   Tetanus booster due: 01/16/2019    Pneumococcal vaccine: Not documented    H. zoster vaccine: Not documented   H. zoster vaccine deferral: Not available  (12/11/2009)  Colorectal Screening   Hemoccult: Not documented   Hemoccult action/deferral: Ordered  (12/11/2009)    Colonoscopy: Excellent prep. Advanced to cecum. No findings other than moderate diverticulosis. Rec fu 10 years.   (11/06/2004)   Colonoscopy action/deferral: Not indicated  (12/11/2009)   Colonoscopy due: 11/07/2014  Other Screening   Pap smear: Not documented   Pap smear action/deferral: Ordered  (08/10/2009)   Pap smear due: 11/12/2009    Mammogram: ASSESSMENT: Negative - BI-RADS 1^MM DIGITAL SCREENING  (08/10/2009)   Mammogram action/deferral:  Ordered  (08/10/2009)   Mammogram due: 08/10/2009    DXA  bone density scan: Not documented   Smoking status: never  (01/01/2010)  Lipids   Total Cholesterol: 175  (08/10/2009)   Lipid panel action/deferral: Lipid Panel ordered   LDL: 112  (08/10/2009)   LDL Direct: Not documented   HDL: 43  (08/10/2009)   Triglycerides: 100  (08/10/2009)   Lipid panel due: 12/12/2010    SGOT (AST): 28  (08/10/2009)   BMP action: Ordered   SGPT (ALT): 27  (08/10/2009)   Alkaline phosphatase: 83  (08/10/2009)   Total bilirubin: 0.5  (08/10/2009)   Liver panel due: 08/11/2010  Hypertension   Last Blood Pressure: 125 / 86  (01/01/2010)   Serum creatinine: 1.23  (11/26/2009)   BMP action: Ordered   Serum potassium 3.8  (11/26/2009)   Basic metabolic panel due: 06/12/2010  Self-Management Support :   Personal Goals (by the next clinic visit) :      Personal blood pressure goal: 140/90  (08/10/2009)     Personal LDL goal: 100  (08/10/2009)    Patient will work on the following items until the next clinic visit to reach self-care goals:     Medications and monitoring: take my medicines every day, check my blood pressure, bring all of my medications to every visit, weigh myself weekly  (01/01/2010)     Eating: drink diet soda or water instead of juice or soda, eat more vegetables, use fresh or frozen vegetables, eat foods that are low in salt, limit or avoid alcohol  (01/01/2010)     Activity: take a 30 minute walk every day  (12/11/2009)    Hypertension self-management support: Written self-care plan, Education handout, Resources for patients handout  (01/01/2010)   Hypertension self-care plan printed.   Hypertension education handout printed    Lipid self-management support: Written self-care plan, Education handout, Resources for patients handout  (01/01/2010)   Lipid self-care plan printed.   Lipid education handout printed      Resource handout printed.  Appended Document:  EST-CK/FU/MEDS/CFB I discussed the patient with Dr. Coralyn Pear and I agree with the assessment and plan as outlined above. The patient is overweight and has radiographic evidence of OA and is symptomatic. I would like to avoid chronic narcs if at all possible. Therefore, PT, weight loss, and non narcs for now.

## 2010-04-02 NOTE — Assessment & Plan Note (Signed)
Summary: Sores in nose, body pain, encoparesis   Vital Signs:  Patient profile:   65 year old female Height:      67.5 inches (171.45 cm) Weight:      348.2 pounds (163.59 kg) BMI:     53.93 Temp:     97.8 degrees F (36.56 degrees C) oral Pulse rate:   84 / minute BP sitting:   125 / 89  (left arm) Cuff size:   large  Vitals Entered By: Theotis Barrio NT II (November 26, 2009 10:58 AM)  Nutrition Counseling: Patient's BMI is greater than 25 and therefore counseled on weight management options. CC: Pain all over, loose stools, nasal sores Is Patient Diabetic? No Pain Assessment Patient in pain? no      Nutritional Status BMI of > 30 = obese  Have you ever been in a relationship where you felt threatened, hurt or afraid?No   Does patient need assistance? Functional Status Self care Ambulation Normal Comments HAS REPORT/ MOLD - MILDEW -C OLD SX /  SORES IN NOSE / ACHES ALL OVER - BELOW WAIST AREA   Primary Care Provider:  Elyse Jarvis  CC:  Pain all over, loose stools, and nasal sores.  History of Present Illness: Pt is a 65 yo F with PMHx of HTN, HLD, morbid obesity, who presents to clinic today with multiple medical concerns, including the following:   1) Nasal sores - ongoing for 1 year. Is being followed by  ENT for this complaint - which was thought to be allergic rhinitis, and has been recommended to start saline rinse, nasal steroids, and Allegra. She did not start steroids because too expensive, and only continued allegra for "half a bottle" because she was not sure if it helped.  Still picking at nose occasionally. Pt will f/u with Dr. Jearld Fenton within 1 week. No nose bleeds, no fevers, no chills, confirms productuve cough with yellow sputum in AM.   Of note, pt discovered that her basement is infested with mold (provided official report today) and feels that several of her problems are resultant from the mold. Therefore, she is reluctant to keep taking medications  symptomatically as she feels that by ridding of the mold the problems will self-resolve.  2) Persistent loose stool and encopresis episodes - less frequent. Was being followed by Dr. Randa Evens, GI, who felt her sx were likely consitent with IBS. Pt continued Robinol x 1 month only, despite being recommended 2 month tx, because feels all current sx are resultant of the home mold. Pt is to f/u with Dr. Randa Evens soon, who had planned colonoscopy if symptoms not improved on medication. Denies weakness, blood in stools, fevers, chills.  3) Diffuse body pain - Ongoing x 1 month, intermittent, achy, 10/10 "achiness not pain", worse when cold outside. Localized throughout lower abdomen and LE, not localized to joints. Leg pain described as crampy. Taking ibuprofen for it 2 tabs two times a day, which helps. Confirms abnormal smell of urine recently, althought without dysuria, hematuria. On Simvastatin and HCTZ, states for a long time. No trauma or falls. No SOB, no difficulty breathing, no CP,  no recent illness, no sick contacts, no rash.     Preventive Screening-Counseling & Management  Alcohol-Tobacco     Alcohol drinks/day: 0     Smoking Status: quit     Year Quit: 1997 or earlier - pt cannot remember when     Pack years: 1PPD x 22 years  Caffeine-Diet-Exercise     Does  Patient Exercise: no  Current Medications (verified): 1)  Wellbutrin Sr 200 Mg Xr12h-Tab (Bupropion Hcl) .... Take 1 Tablet By Mouth Each Morning and One Tablet By Mouth At [facility] Daily. 2)  Alprazolam 1 Mg Tabs (Alprazolam) .... Take 1 Tablet By Mouth Up To 4 Times Daily. 3)  Multivitamins  Tabs (Multiple Vitamin) .... Take 1 Tablet By Mouth Once A Day 4)  Pravachol 20 Mg  Tabs (Pravastatin Sodium) .... Take One Pill Every Night 5)  Hydrochlorothiazide 25 Mg  Tabs (Hydrochlorothiazide) .... Take 1 Tablet By Mouth Once A Day 6)  Famotidine 20 Mg  Tabs (Famotidine) .... Take 1 Tablet By Mouth Two Times A Day 7)  Oxybutynin Chloride 5  Mg Tabs (Oxybutynin Chloride) .... Take 1 Tablet By Mouth Two Times A Day 8)  Prilosec Otc 20 Mg Tbec (Omeprazole Magnesium) .... Take 1 Tablet By Mouth Once A Day 9)  Allegra 180 Mg Tabs (Fexofenadine Hcl) .... Take 1 Tablet By Mouth Once A Day 10)  Robinul-Forte 2 Mg Tabs (Glycopyrrolate) .[name].. 1 Tablet Three Times A Day 11)  Ibuprofen 200 Mg Tabs (Ibuprofen) .... Every 4-6 Hours As Needed  Allergies: 1)  Lisinopril (Lisinopril)  Physical Exam  General:  Well-developed,well-nourished,in no acute distress; alert,appropriate and cooperative throughout examination Head:  Normocephalic and atraumatic without obvious abnormalities.  Eyes:  No corneal or conjunctival inflammation noted.Perrla. Nose:  External nasal examination shows no deformity or inflammation. Nasal mucosa mildly inflammed with clear rhinnorhea Mouth:  Oral mucosa and oropharynx without lesions or exudates.   Neck:  No deformities, masses, or tenderness noted. Lungs:  Normal respiratory effort, chest expands symmetrically. Lungs are clear to auscultation, no crackles or wheezes. Heart:  Normal rate and regular rhythm. S1 and S2 normal without gallop, murmur, click, rub or other extra sounds. Abdomen:  Bowel sounds normoactive, soft, nondistended. TTP BL LQ - no CVA tenderness to back Pulses:  R and L dorsalis pedis pulses are full and equal bilaterally Extremities:  No clubbing, cyanosis, edema, or deformity noted with normal full range of motion of all joints.  No tenderness to palpation   Impression & Recommendations:  Problem # 1:  ABDOMINAL PAIN, UNSPECIFIED SITE (ICD-789.00) Lower abdominal pain. Pt does have a hx of diverticulosis, however, is at this time without fevers, chills. Pt with complaint of malodorous urine. Will therefore check urine. Seems symptoms may otherwise be consistent with her presumed IBS, and patient may get further relief if recommended medications are continued as prescribed. - Will check UA,  proceed as indicated - Will recommend follow-up with GI, as scheduled, with colonoscopy if she doesn't respond to the Robinol therapy   Orders: T-Urinalysis ([id number])  Problem # 2:  MYALGIA (ICD-729.1) Etiology unclear at this time. Muscles were not erythematous or painful to palpation so Pyomyositis, abscess, muscle infarction, or compartment syndrome less likely. Unlikely to be secondary to statin as it has been tolerated well since 2008. Last BMET showed K that was low normal, of 3.6, so will recheck. No recent increase in activity level, rhabdomyolysis less likely. No hx of thyroid disorder, no hx of hypo/hypercalcemia. - will check ESR, and BMET and reassess.  Her updated medication list for this problem includes:    Ibuprofen 200 Mg Tabs (Ibuprofen) .[name]... 1-2 tabs by mouth every 4-6 hours as needed  Orders: T-Basic Metabolic Panel [phone]) T-Sed Rate (Automated) ([phone])  Problem # 3:  ALLERGIC RHINITIS (ICD-477.9) Symptoms could be exacerbated by mold in home. - Recommended restart an antihistamine,  and pt prefers to restart Allegra because she already has some - Recommended f/u with ENT as scheduled.   Her updated medication list for this problem includes:    Allegra 180 Mg Tabs (Fexofenadine hcl) .[name]... Take 1 tablet by mouth once a day  Complete Medication List: 1)  Wellbutrin Sr 200 Mg Xr12h-tab (Bupropion hcl) .... Take 1 tablet by mouth each morning and one tablet by mouth at noon daily. 2)  Alprazolam 1 Mg Tabs (Alprazolam) .... Take 1 tablet by mouth up to 4 times daily. 3)  Multivitamins Tabs (Multiple vitamin) .... Take 1 tablet by mouth once a day 4)  Pravachol 20 Mg Tabs (Pravastatin sodium) .... Take one pill every night 5)  Hydrochlorothiazide 25 Mg Tabs (Hydrochlorothiazide) .... Take 1 tablet by mouth once a day 6)  Famotidine 20 Mg Tabs (Famotidine) .... Take 1 tablet by mouth two times a day 7)  Norvasc 5 Mg Tabs (Amlodipine besylate) .... Take 1  tablet by mouth once a day 8)  Oxybutynin Chloride 5 Mg Tabs (Oxybutynin chloride) .... Take 1 tablet by mouth two times a day 9)  Prilosec Otc 20 Mg Tbec (Omeprazole magnesium) .... Take 1 tablet by mouth once a day 10)  Allegra 180 Mg Tabs (Fexofenadine hcl) .... Take 1 tablet by mouth once a day 11)  Robinul-forte 2 Mg Tabs (Glycopyrrolate) .[name].. 1 tablet three times a day 12)  Ibuprofen 200 Mg Tabs (Ibuprofen) .[name].. 1-2 tabs by mouth every 4-6 hours as needed  Other Orders: Influenza Vaccine MCR ([id number])  Patient Instructions: 1)  Please follow-up in clinic in 2-4 week for recheck of you body aches and pains.  2)  Please follow-up with your ENT doctor for your nasal problems, and have him send [place] the report. Restart the Allegra. 3)  Please follow-up with your GI doctor about your stools, and have him send [place] the report. 4)  Take NO MORE than 2000mg  of Ibuprofen for your pain. Take as little as possible that still helps to control your pain. 5)  Try to start exercising, stop and call clinic if you develop chest pain, difficulty breathing, shortness of breath. 6)  We are checking labs today, if they are abnormal, we will call you. 7)  Please bring your medication in a bag with you to your next visit. Prescriptions: OXYBUTYNIN CHLORIDE 5 MG TABS (OXYBUTYNIN CHLORIDE) Take 1 tablet by mouth two times a day  #180 x 3   Entered and Authorized by:   Johnette Abraham DO   Signed by:   Johnette Abraham DO on 11/26/2009   Method used:   Print then Give to Patient   RxID:   [id number] NORVASC 5 MG  TABS (AMLODIPINE BESYLATE) Take 1 tablet by mouth once a day  #90 x 3   Entered and Authorized by:   Johnette Abraham DO   Signed by:   Johnette Abraham DO on 11/26/2009   Method used:   Print then Give to Patient   RxID:   [id number] FAMOTIDINE 20 MG  TABS (FAMOTIDINE) Take 1 tablet by mouth two times a day  #180 Tablet x 3   Entered and Authorized by:   Johnette Abraham DO   Signed by:   Johnette Abraham DO on 11/26/2009   Method used:   Print then Give to Patient   RxID:   [id number] HYDROCHLOROTHIAZIDE 25 MG  TABS (HYDROCHLOROTHIAZIDE) Take 1 tablet by mouth once a day  #90 x 3   Entered and  Authorized by:   Johnette Abraham DO   Signed by:   Johnette Abraham DO on 11/26/2009   Method used:   Print then Give to Patient   RxID:   [id number] PRAVACHOL 20 MG  TABS (PRAVASTATIN SODIUM) Take one pill every night  #90 Tablet x 3   Entered and Authorized by:   Johnette Abraham DO   Signed by:   Johnette Abraham DO on 11/26/2009   Method used:   Print then Give to Patient   RxID:   [id number]  Process Orders Check Orders Results:     [facility]: ABN not required for this insurance Tests Sent for requisitioning (November 28, 2009 7:23 AM):     11/26/2009: [facility] -- T-Urinalysis [[id number]] (signed)     11/26/2009: [facility] -- T-Basic Metabolic Panel [phone] (signed)     11/26/2009: [facility] -- T-Sed Rate (Automated) [[id number]] (signed)     Prevention & Chronic Care Immunizations   Influenza vaccine: Fluvax MCR  (11/26/2009)   Influenza vaccine deferral: Not available  (08/10/2009)    Tetanus booster: 01/15/2009: Tdap    Pneumococcal vaccine: Not documented    H. zoster vaccine: Not documented  Colorectal Screening   Hemoccult: Not documented    Colonoscopy: Excellent prep. Advanced to cecum. No findings other than moderate diverticulosis. Rec fu 10 years.   (11/06/2004)  Other Screening   Pap smear: Not documented   Pap smear action/deferral: Ordered  (08/10/2009)   Pap smear due: 11/12/2009    Mammogram: ASSESSMENT: Negative - BI-RADS 1^MM DIGITAL SCREENING  (08/10/2009)   Mammogram action/deferral: Ordered  (08/10/2009)   Mammogram due: 08/10/2009    DXA bone density scan: Not  documented   Smoking status: quit  (11/26/2009)  Lipids   Total Cholesterol: 175  (08/10/2009)   Lipid panel action/deferral: Lipid Panel ordered   LDL: 112  (08/10/2009)   LDL Direct: Not documented   HDL: 43  (08/10/2009)   Triglycerides: 100  (08/10/2009)    SGOT (AST): 28  (08/10/2009)   BMP action: Ordered   SGPT (ALT): 27  (08/10/2009)   Alkaline phosphatase: 83  (08/10/2009)   Total bilirubin: 0.5  (08/10/2009)  Hypertension   Last Blood Pressure: 125 / 89  (11/26/2009)   Serum creatinine: 1.14  (08/10/2009)   BMP action: Ordered   Serum potassium 3.7  (08/10/2009)  Self-Management Support :   Personal Goals (by the next clinic visit) :      Personal blood pressure goal: 140/90  (08/10/2009)     Personal LDL goal: 100  (08/10/2009)    Patient will work on the following items until the next clinic visit to reach self-care goals:     Medications and monitoring: take my medicines every day, check my blood pressure, bring all of my medications to every visit  (11/26/2009)     Eating: use fresh or frozen vegetables, eat baked foods instead of fried foods, limit or avoid alcohol  (11/26/2009)     Activity: take a 30 minute walk every day  (09/17/2009)    Hypertension self-management support: Education handout, Resources for patients handout, Written self-care plan  (08/10/2009)    Lipid self-management support: Education handout, Resources for patients handout, Written self-care plan  (08/10/2009)     Influenza Vaccine    Vaccine Type: Fluvax MCR    Site: right deltoid    Mfr: [employer]oSmith[employer]    Dose: 0.5 ml    Route: IM    Given by:  Debra Ditzler RN    Exp. Date: 08/31/2010    Lot #: [id number]    VIS given: 09/25/09 version given November 26, 2009.  Flu Vaccine Consent Questions    Do you have a history of severe allergic reactions to this vaccine? no    Any prior history of allergic reactions to egg and/or gelatin? no    Do you have a sensitivity to the  preservative Thimersol? no    Do you have a past history of Guillan-Barre Syndrome? no    Do you currently have an acute febrile illness? no    Have you ever had a severe reaction to latex? no    Vaccine information given and explained to patient? yes    Are you currently pregnant? no

## 2010-04-02 NOTE — Assessment & Plan Note (Signed)
Summary: ACUTE-1 MONTH F/U ([facility])/CFB   Vital Signs:  Patient profile:   65 year old female Height:      67.5 inches (171.45 cm) Weight:      359.9 pounds (163.59 kg) BMI:     55.74 Temp:     98.1 degrees F (36.72 degrees C) oral Pulse rate:   82 / minute BP sitting:   126 / 79  (right arm)  Vitals Entered By: Theotis Barrio NT II (September 17, 2009 11:52 AM) CC: PATIENT IS HERE FOR  FOLLOW UP APPT  / Is Patient Diabetic? No Pain Assessment Patient in pain? no      Nutritional Status BMI of > 30 = obese  Have you ever been in a relationship where you felt threatened, hurt or afraid?No   Does patient need assistance? Functional Status Self care Ambulation Normal   Primary Care Provider:  Aris Lot MD  CC:  PATIENT IS HERE FOR  FOLLOW UP APPT  /.  History of Present Illness: 65 y/o female with pmh as described on the EMR; who comes to the clinic for followup on her loose stool/enconpresis, throat discomfort and nasal bleeding/coughing up blood.  She described that after visiting Dr. Jearld Fenton (ENT) and started using nasal rinse therapy and finished antibiotics prescribed by him, she has noticed improved in her nasal discharge adn bleeding; unfortunely she continue to pick inside her nose and continue remove crusting formation from her already friable nostril mucose. She has not started using antihistaminic (allegra 180mg  daily) prescribed by Dr.Byers and is still having some post-nasal drip symptoms.  She started using prilosec as directed per Dr. Onalee Hua and her GERD is better, she also use as needed famotidine.  Patient continue having loose stool and encopresis episodes;but after following with GI (Dr. Randa Evens), she  has not follow recommendations given by him; so plan is for her to start robinul three times a day and follow with him in 2 months; if symptoms persist despite tx acolonoscopy will be done. Per patients symptoms descriptions and findings on physical exam +  labs, Dr. Claudie Leach she most likle has IBS.  Preventive Screening-Counseling & Management  Alcohol-Tobacco     Alcohol drinks/day: 0     Smoking Status: quit     Year Quit: 1997 or earlier - pt cannot remember when     Pack years: 1PPD x 22 years  Caffeine-Diet-Exercise     Does Patient Exercise: no  Problems Prior to Update: 1)  Encopresis  (ICD-307.7) 2)  Epistaxis  (ICD-784.7) 3)  Preventive Health Care  (ICD-V70.0) 4)  Urge Incontinence  (ICD-788.31) 5)  Other Screening Mammogram  (ICD-V76.12) 6)  Lung Nodule  (ICD-518.89) 7)  Symp Swell/mass/lump, Localized Superficial  (ICD-782.2) 8)  Gerd  (ICD-530.81) 9)  Hypokalemia  (ICD-276.8) 10)  Symptom, Cough  (ICD-786.2) 11)  Hx of Fall  (ICD-V15.88) 12)  Aftercare, Long-term Use, Medications Nec  (ICD-V58.69) 13)  Renal Insufficiency  (ICD-588.9) 14)  Hyperlipidemia  (ICD-272.4) 15)  Obesity  (ICD-278.00) 16)  Fibrocystic Breast Disease  (ICD-610.1) 17)  Obstructive Sleep Apnea  (ICD-327.23) 18)  Pulmonary Embolism, Hx of  (ICD-V12.51) 19)  Osteoarthritis  (ICD-715.90) 20)  Hypertension  (ICD-401.9) 21)  Hepatitis B, Hx of  (ICD-V12.09) 22)  Diverticulosis, Colon  (ICD-562.10) 23)  Depression  (ICD-311)  Current Problems (verified): 1)  Encopresis  (ICD-307.7) 2)  Epistaxis  (ICD-784.7) 3)  Preventive Health Care  (ICD-V70.0) 4)  Urge Incontinence  (ICD-788.31) 5)  Other Screening Mammogram  (  ICD-V76.12) 6)  Lung Nodule  (ICD-518.89) 7)  Symp Swell/mass/lump, Localized Superficial  (ICD-782.2) 8)  Gerd  (ICD-530.81) 9)  Hypokalemia  (ICD-276.8) 10)  Symptom, Cough  (ICD-786.2) 11)  Hx of Fall  (ICD-V15.88) 12)  Aftercare, Long-term Use, Medications Nec  (ICD-V58.69) 13)  Renal Insufficiency  (ICD-588.9) 14)  Hyperlipidemia  (ICD-272.4) 15)  Obesity  (ICD-278.00) 16)  Fibrocystic Breast Disease  (ICD-610.1) 17)  Obstructive Sleep Apnea  (ICD-327.23) 18)  Pulmonary Embolism, Hx of  (ICD-V12.51) 19)   Osteoarthritis  (ICD-715.90) 20)  Hypertension  (ICD-401.9) 21)  Hepatitis B, Hx of  (ICD-V12.09) 22)  Diverticulosis, Colon  (ICD-562.10) 23)  Depression  (ICD-311)  Medications Prior to Update: 1)  Wellbutrin Sr 200 Mg Xr12h-Tab (Bupropion Hcl) .... Take 1 Tablet By Mouth Each Morning and One Tablet By Mouth At [facility] Daily. 2)  Alprazolam 1 Mg Tabs (Alprazolam) .... Take 1 Tablet By Mouth Up To 4 Times Daily. 3)  Multivitamins  Tabs (Multiple Vitamin) .... Take 1 Tablet By Mouth Once A Day 4)  Pravachol 20 Mg  Tabs (Pravastatin Sodium) .... Take One Pill Every Night 5)  Hydrochlorothiazide 25 Mg  Tabs (Hydrochlorothiazide) .... Take 1 Tablet By Mouth Once A Day 6)  Famotidine 20 Mg  Tabs (Famotidine) .... Take 1 Tablet By Mouth Two Times A Day 7)  Norvasc 5 Mg  Tabs (Amlodipine Besylate) .... Take 1 Tablet By Mouth Once A Day 8)  Oxybutynin Chloride 5 Mg Tabs (Oxybutynin Chloride) .... Take 1 Tablet By Mouth Two Times A Day 9)  Prilosec Otc 20 Mg Tbec (Omeprazole Magnesium) .... Take 1 Tablet By Mouth Once A Day  Current Medications (verified): 1)  Wellbutrin Sr 200 Mg Xr12h-Tab (Bupropion Hcl) .... Take 1 Tablet By Mouth Each Morning and One Tablet By Mouth At [facility] Daily. 2)  Alprazolam 1 Mg Tabs (Alprazolam) .... Take 1 Tablet By Mouth Up To 4 Times Daily. 3)  Multivitamins  Tabs (Multiple Vitamin) .... Take 1 Tablet By Mouth Once A Day 4)  Pravachol 20 Mg  Tabs (Pravastatin Sodium) .... Take One Pill Every Night 5)  Hydrochlorothiazide 25 Mg  Tabs (Hydrochlorothiazide) .... Take 1 Tablet By Mouth Once A Day 6)  Famotidine 20 Mg  Tabs (Famotidine) .... Take 1 Tablet By Mouth Two Times A Day 7)  Norvasc 5 Mg  Tabs (Amlodipine Besylate) .... Take 1 Tablet By Mouth Once A Day 8)  Oxybutynin Chloride 5 Mg Tabs (Oxybutynin Chloride) .... Take 1 Tablet By Mouth Two Times A Day 9)  Prilosec Otc 20 Mg Tbec (Omeprazole Magnesium) .... Take 1 Tablet By Mouth Once A Day 10)  Allegra 180 Mg Tabs  (Fexofenadine Hcl) .... Take 1 Tablet By Mouth Once A Day 11)  Robinul-Forte 2 Mg Tabs (Glycopyrrolate) .[name].. 1 Tablet Three Times A Day  Allergies (verified): 1)  Lisinopril (Lisinopril)  Past History:  Past Medical History: Last updated: 04/06/2008 Depression foll. by Dr. Evelene Croon Diverticulosis, colon Hepatitis B, hx of Hypertension Osteoarthritis, knees Pulmonary embolism, hx of - 01/2002 Obesity Fibrocystic breast disease Obstructive sleep apnea, refuses CPAP Hyperlipidemia Renal insufficiency, creatinine 1.33 10/2005 nodule located at the base of posterior neck on the left side. (not painful, not erythematous, movable, likely cyst:  will need close follow up for changes.  Left shoulder pain secondary to fall.  Pulmonary nodule:  repeat CT showed resolution which indicated infectious vs inflammatory nodule.  urge incontinence  Past Surgical History: Last updated: 01/19/2006 GYN surgery, s/p excision of vulvar  cyst 10/18/2004 by Dr. Antionette Char  Family History: Last updated: 01/19/2006 Family History Hypertension  Social History: Last updated: 01/19/2006 Divorced Former Smoker  Risk Factors: Alcohol Use: 0 (09/17/2009) Exercise: no (09/17/2009)  Risk Factors: Smoking Status: quit (09/17/2009)  Review of Systems       As per HPI.  Physical Exam  General:  alert, obese, well-nourished, and well-hydrated.   Nose:  friable nostril mucose and small clean lesions in different healing stages, couple of them with fresh crust formation. Lungs:  normal respiratory effort, no accessory muscle use, normal breath sounds, no crackles, and no wheezes.   Heart:  normal rate, regular rhythm, and no murmur.  distant. Abdomen:  normal bowel sounds.  soft, no distention, no guarding, and no rigidity.   Extremities:  1+ bilateral edema Neurologic:  alert & oriented X3, cranial nerves II-XII intact, and gait normal.     Impression & Recommendations:  Problem # 1:   ENCOPRESIS (ICD-307.7) Presumtive diagnoseof IBS made by Dr. Randa Evens (GI); she was started on Robinul three times a day and will follow with him in 2 months, if symptoms failed to improved by then, the plan is to repeat colonoscopy. Patient has been advised to eat small portion multiple times and to be compliant with medication.  Problem # 2:  EPISTAXIS (ICD-784.7) Improving after nasal rinse and also antibiotics. She has been encourage to stop picking inside her nose and to start using allegra as directed to help with post nasal drip and rhinitis. Patient reports she just bought the medication and after receiving information and discussing importance of therapy is planning to be compliant with antihistaminic regimen.  Problem # 3:  GERD (ICD-530.81) A lot better after using prilosec; will continue same regimenand will also provide lifestyle modification instructions.  Her updated medication list for this problem includes:    Famotidine 20 Mg Tabs (Famotidine) .[name]... Take 1 tablet by mouth two times a day    Prilosec Otc 20 Mg Tbec (Omeprazole magnesium) .[name]... Take 1 tablet by mouth once a day    Robinul-forte 2 Mg Tabs (Glycopyrrolate) .[name]... 1 tablet three times a day  Problem # 4:  HYPERTENSION (ICD-401.9) Excellent control. Willcontinue same regimen and will advise her to follow a low sodiumdiet and to start exercising and losing weight. Recent BMET done demonstrated stable renal function and WNL electrolytes.  Her updated medication list for this problem includes:    Hydrochlorothiazide 25 Mg Tabs (Hydrochlorothiazide) .[name]... Take 1 tablet by mouth once a day    Norvasc 5 Mg Tabs (Amlodipine besylate) .[name]... Take 1 tablet by mouth once a day  BP today: 126/79 Prior BP: 125/79 (08/10/2009)  Labs Reviewed: K+: 3.7 (08/10/2009) Creat: : 1.14 (08/10/2009)   Chol: 175 (08/10/2009)   HDL: 43 (08/10/2009)   LDL: 112 (08/10/2009)   TG: 100 (08/10/2009)  Complete Medication List: 1)  Wellbutrin  Sr 200 Mg Xr12h-tab (Bupropion hcl) .... Take 1 tablet by mouth each morning and one tablet by mouth at noon daily. 2)  Alprazolam 1 Mg Tabs (Alprazolam) .... Take 1 tablet by mouth up to 4 times daily. 3)  Multivitamins Tabs (Multiple vitamin) .... Take 1 tablet by mouth once a day 4)  Pravachol 20 Mg Tabs (Pravastatin sodium) .... Take one pill every night 5)  Hydrochlorothiazide 25 Mg Tabs (Hydrochlorothiazide) .... Take 1 tablet by mouth once a day 6)  Famotidine 20 Mg Tabs (Famotidine) .... Take 1 tablet by mouth two times a day 7)  Norvasc  5 Mg Tabs (Amlodipine besylate) .... Take 1 tablet by mouth once a day 8)  Oxybutynin Chloride 5 Mg Tabs (Oxybutynin chloride) .... Take 1 tablet by mouth two times a day 9)  Prilosec Otc 20 Mg Tbec (Omeprazole magnesium) .... Take 1 tablet by mouth once a day 10)  Allegra 180 Mg Tabs (Fexofenadine hcl) .... Take 1 tablet by mouth once a day 11)  Robinul-forte 2 Mg Tabs (Glycopyrrolate) .[name].. 1 tablet three times a day  Patient Instructions: 1)  Please do not pick inside your nose. 2)  Follow GI and ENT recommendations. 3)  Start taking your Allegra and also Robinul as directed. 4)  Avoid foods high in acid (tomatoes, citrus juices, spicy foods). Avoid eating within two hours of lying down or before exercising. Do not over eat; try smaller more frequent meals. Elevate head of bed twelve inches when sleeping. 5)  Please schedule a follow-up appointment in 2-3 months.  Prevention & Chronic Care Immunizations   Influenza vaccine: Fluvax 3+  (01/15/2009)   Influenza vaccine deferral: Not available  (08/10/2009)    Tetanus booster: 01/15/2009: Tdap    Pneumococcal vaccine: Not documented    H. zoster vaccine: Not documented  Colorectal Screening   Hemoccult: Not documented    Colonoscopy: Excellent prep. Advanced to cecum. No findings other than moderate diverticulosis. Rec fu 10 years.   (11/06/2004)  Other Screening   Pap smear: Not  documented   Pap smear action/deferral: Ordered  (08/10/2009)   Pap smear due: 11/12/2009    Mammogram: ASSESSMENT: Negative - BI-RADS 1^MM DIGITAL SCREENING  (08/10/2009)   Mammogram action/deferral: Ordered  (08/10/2009)   Mammogram due: 08/10/2009    DXA bone density scan: Not documented   Smoking status: quit  (09/17/2009)  Lipids   Total Cholesterol: 175  (08/10/2009)   Lipid panel action/deferral: Lipid Panel ordered   LDL: 112  (08/10/2009)   LDL Direct: Not documented   HDL: 43  (08/10/2009)   Triglycerides: 100  (08/10/2009)    SGOT (AST): 28  (08/10/2009)   BMP action: Ordered   SGPT (ALT): 27  (08/10/2009)   Alkaline phosphatase: 83  (08/10/2009)   Total bilirubin: 0.5  (08/10/2009)  Hypertension   Last Blood Pressure: 126 / 79  (09/17/2009)   Serum creatinine: 1.14  (08/10/2009)   BMP action: Ordered   Serum potassium 3.7  (08/10/2009)  Self-Management Support :   Personal Goals (by the next clinic visit) :      Personal blood pressure goal: 140/90  (08/10/2009)     Personal LDL goal: 100  (08/10/2009)    Patient will work on the following items until the next clinic visit to reach self-care goals:     Medications and monitoring: take my medicines every day, check my blood pressure, bring all of my medications to every visit  (09/17/2009)     Eating: use fresh or frozen vegetables, eat foods that are low in salt, eat baked foods instead of fried foods  (09/17/2009)     Activity: take a 30 minute walk every day  (09/17/2009)    Hypertension self-management support: Education handout, Resources for patients handout, Written self-care plan  (08/10/2009)    Lipid self-management support: Education handout, Resources for patients handout, Written self-care plan  (08/10/2009)

## 2010-04-04 NOTE — Assessment & Plan Note (Signed)
Summary: est-ck/fu/meds/cfb   Vital Signs:  Patient profile:   65 year old female Height:      67.5 inches (171.45 cm) Weight:      341.9 pounds (14.50 kg) BMI:     52.95 Temp:     98.0 degrees F oral Pulse rate:   92 / minute BP sitting:   109 / 73  (right arm) Cuff size:   thigh  Vitals Entered By: Chinita Pester RN (February 11, 2010 1:26 PM) CC: followupvisit Is Patient Diabetic? No Nutritional Status BMI of > 30 = obese  Does patient need assistance? Functional Status Self care Ambulation Normal   Primary Care Provider:  Elyse Jarvis  CC:  followupvisit.  History of Present Illness: 65 y/o woman with PMH significant for OA, depression, HTN comes to the clinic for a follow up visit.  She denies any new complaints. She reports that her knees are hurting , rates the pain around 2/10.  She was tearful and upset because her girl friend is recently diagnosed with cancer. Also she is having some trouble wth her brother. But denies any SI/HI.  Current Medications (verified): 1)  Wellbutrin Sr 200 Mg Xr12h-Tab (Bupropion Hcl) .... Take 1 Tablet By Mouth Each Morning and One Tablet By Mouth At [facility] Daily. 2)  Alprazolam 1 Mg Tabs (Alprazolam) .... Take 1 Tablet By Mouth Up To 4 Times Daily. 3)  Multivitamins  Tabs (Multiple Vitamin) .... Take 1 Tablet By Mouth Once A Day 4)  Pravachol 20 Mg  Tabs (Pravastatin Sodium) .... Take One Pill Every Night 5)  Hydrochlorothiazide 25 Mg  Tabs (Hydrochlorothiazide) .... Take 1 Tablet By Mouth Once A Day 6)  Famotidine 20 Mg  Tabs (Famotidine) .... Take 1 Tablet By Mouth Two Times A Day 7)  Norvasc 5 Mg  Tabs (Amlodipine Besylate) .... Take 1 Tablet By Mouth Once A Day 8)  Oxybutynin Chloride 5 Mg Tabs (Oxybutynin Chloride) .... Take 1 Tablet By Mouth Two Times A Day 9)  Allegra 180 Mg Tabs (Fexofenadine Hcl) .... Take 1 Tablet By Mouth Once A Day 10)  Ibuprofen 200 Mg Tabs (Ibuprofen) .[name].. 1-2 Tabs By Mouth Every 4-6 Hours As  Needed  Allergies (verified): 1)  Lisinopril (Lisinopril)  Past History:  Past Medical History: Last updated: 04/06/2008 Depression foll. by Dr. Evelene Croon Diverticulosis, colon Hepatitis B, hx of Hypertension Osteoarthritis, knees Pulmonary embolism, hx of - 01/2002 Obesity Fibrocystic breast disease Obstructive sleep apnea, refuses CPAP Hyperlipidemia Renal insufficiency, creatinine 1.33 10/2005 nodule located at the base of posterior neck on the left side. (not painful, not erythematous, movable, likely cyst:  will need close follow up for changes.  Left shoulder pain secondary to fall.  Pulmonary nodule:  repeat CT showed resolution which indicated infectious vs inflammatory nodule.  urge incontinence  Past Surgical History: Last updated: 01/19/2006 GYN surgery, s/p excision of vulvar cyst 10/18/2004 by Dr. Antionette Char  Family History: Last updated: 01/19/2006 Family History Hypertension  Social History: Last updated: 01/19/2006 Divorced Former Smoker  Risk Factors: Alcohol Use: 0 (01/01/2010) Exercise: no (01/01/2010)  Risk Factors: Smoking Status: never (01/01/2010)  Review of Systems      See HPI  The patient denies anorexia, fever, decreased hearing, hoarseness, chest pain, syncope, dyspnea on exertion, prolonged cough, headaches, abdominal pain, melena, and hematochezia.    Physical Exam  General:  alert , morbidly obese and well-developed.   Head:  normocephalic and atraumatic.   Eyes:  vision grossly intact, pupils equal, pupils round,  and pupils reactive to light.   Mouth:  poor dentition and teeth missing.   Neck:  supple, full ROM, and no masses.   Lungs:  normal respiratory effort, no intercostal retractions, no accessory muscle use, normal breath sounds, no dullness, no fremitus, no crackles, and no wheezes.   Heart:  normal rate, regular rhythm, no murmur, no gallop, no rub, and no JVD.   Abdomen:  soft, non-tender, normal bowel sounds, no  distention, no masses, no guarding, no rigidity, and no rebound tenderness.   Msk:  normal ROM, no joint tenderness, no joint swelling, no joint warmth, no redness over joints, and no joint deformities.   Pulses:  2+pulses b/l. Extremities:  no cyanosis, clubbing or edema. Neurologic:  alert & oriented X3, cranial nerves II-XII intact, strength normal in all extremities, sensation intact to light touch, gait normal, and DTRs symmetrical and normal.     Impression & Recommendations:  Problem # 1:  DEPRESSION (ICD-311) Assessment Comment Only She was quite upset and tearful with today's visit. Her girl friend was recently diagnosed with cancer and she was apprehensive about her well being. She is also having some other family issues with her brother.  Denies SI,HI. She was given reassurance and I also offered her talking to our social workers who could help her with some helplines for psychotherapy, but she denied. She felt better talking and venting out her emotions and feelings. Was adviced to call clinic or 911 if she has suicidal thoughts. Her updated medication list for this problem includes:    Wellbutrin Sr 200 Mg Xr12h-tab (Bupropion hcl) .[name]... Take 1 tablet by mouth each morning and one tablet by mouth at noon daily.    Alprazolam 1 Mg Tabs (Alprazolam) .[name]... Take 1 tablet by mouth up to 4 times daily.  Problem # 2:  OSTEOARTHRITIS (ICD-715.90) Assessment: Comment Only She is trying to loose weight and reports that is helping her with the knee pain. Encouraged her to eat healthy and loose weight. Her updated medication list for this problem includes:    Ibuprofen 200 Mg Tabs (Ibuprofen) .[name]... 1-2 tabs by mouth every 4-6 hours as needed  Problem # 3:  HYPERTENSION (ICD-401.9) Assessment: Comment Only  Well controlled. Patient was adviced to maintain a BP diary. May consider taking her off norvasc if her BP stays well controlled with her next visit. Her updated medication list for  this problem includes:    Hydrochlorothiazide 25 Mg Tabs (Hydrochlorothiazide) .[name]... Take 1 tablet by mouth once a day    Norvasc 5 Mg Tabs (Amlodipine besylate) .[name]... Take 1 tablet by mouth once a day  BP today: 109/73 Prior BP: 125/86 (01/01/2010)  Labs Reviewed: K+: 3.8 (11/26/2009) Creat: : 1.23 (11/26/2009)   Chol: 175 (08/10/2009)   HDL: 43 (08/10/2009)   LDL: 112 (08/10/2009)   TG: 100 (08/10/2009)  Problem # 4:  GERD (ICD-530.81) Assessment: Comment Only Her symptoms are well controlled. Continue same treatment. The following medications were removed from the medication list:    Prilosec Otc 20 Mg Tbec (Omeprazole magnesium) .[name]... Take 1 tablet by mouth once a day    Robinul-forte 2 Mg Tabs (Glycopyrrolate) .[name]... 1 tablet three times a day Her updated medication list for this problem includes:    Famotidine 20 Mg Tabs (Famotidine) .[name]... Take 1 tablet by mouth two times a day  Problem # 5:  Preventive Health Care (ICD-V70.0) Assessment: Comment Only She [facility]  Wil try to retrive her records.  Complete Medication List: 1)  Wellbutrin Sr 200 Mg Xr12h-tab (Bupropion hcl) .... Take 1 tablet by mouth each morning and one tablet by mouth at noon daily. 2)  Alprazolam 1 Mg Tabs (Alprazolam) .... Take 1 tablet by mouth up to 4 times daily. 3)  Multivitamins Tabs (Multiple vitamin) .... Take 1 tablet by mouth once a day 4)  Pravachol 20 Mg Tabs (Pravastatin sodium) .... Take one pill every night 5)  Hydrochlorothiazide 25 Mg Tabs (Hydrochlorothiazide) .... Take 1 tablet by mouth once a day 6)  Famotidine 20 Mg Tabs (Famotidine) .... Take 1 tablet by mouth two times a day 7)  Norvasc 5 Mg Tabs (Amlodipine besylate) .... Take 1 tablet by mouth once a day 8)  Oxybutynin Chloride 5 Mg Tabs (Oxybutynin chloride) .... Take 1 tablet by mouth two times a day 9)  Allegra 180 Mg Tabs (Fexofenadine hcl) .... Take 1 tablet by mouth  once a day 10)  Ibuprofen 200 Mg Tabs (Ibuprofen) .[name].. 1-2 tabs by mouth every 4-6 hours as needed  Other Orders: T-PAP [facility]) [phone])  Patient Instructions: 1)  Please schedule a follow-up appointment in 3 months. 2)  Please take your medicines as prescribed.   Orders Added: 1)  T-PAP [facility]) [[id number]] 2)  Est. Patient Level III [95284]     Prevention & Chronic Care Immunizations   Influenza vaccine: Fluvax MCR  (11/26/2009)   Influenza vaccine deferral: Not indicated  (12/11/2009)   Influenza vaccine due: 11/02/2010    Tetanus booster: 01/15/2009: Tdap   Tetanus booster due: 01/16/2019    Pneumococcal vaccine: Not documented    H. zoster vaccine: Not documented   H. zoster vaccine deferral: Not available  (12/11/2009)  Colorectal Screening   Hemoccult: Not documented   Hemoccult action/deferral: Ordered  (12/11/2009)    Colonoscopy: Excellent prep. Advanced to cecum. No findings other than moderate diverticulosis. Rec fu 10 years.   (11/06/2004)   Colonoscopy action/deferral: Not indicated  (12/11/2009)   Colonoscopy due: 11/07/2014  Other Screening   Pap smear: Not documented   Pap smear action/deferral: Ordered  (02/11/2010)   Pap smear due: 11/12/2009    Mammogram: ASSESSMENT: Negative - BI-RADS 1^MM DIGITAL SCREENING  (08/10/2009)   Mammogram action/deferral: Ordered  (08/10/2009)   Mammogram due: 08/10/2009    DXA bone density scan: Not documented   Smoking status: never  (01/01/2010)  Lipids   Total Cholesterol: 175  (08/10/2009)   Lipid panel action/deferral: Lipid Panel ordered   LDL: 112  (08/10/2009)   LDL Direct: Not documented   HDL: 43  (08/10/2009)   Triglycerides: 100  (08/10/2009)   Lipid panel due: 12/12/2010    SGOT (AST): 28  (08/10/2009)   BMP action: Ordered   SGPT (ALT): 27  (08/10/2009)   Alkaline phosphatase: 83  (08/10/2009)   Total bilirubin: 0.5  (08/10/2009)   Liver panel due: 08/11/2010  Hypertension   Last  Blood Pressure: 109 / 73  (02/11/2010)   Serum creatinine: 1.23  (11/26/2009)   BMP action: Ordered   Serum potassium 3.8  (11/26/2009)   Basic metabolic panel due: 06/12/2010  Self-Management Support :   Personal Goals (by the next clinic visit) :      Personal blood pressure goal: 140/90  (08/10/2009)     Personal LDL goal: 100  (08/10/2009)    Hypertension self-management support: Written self-care plan, Education handout, Resources for patients handout  (01/01/2010)    Lipid self-management support:  Written self-care plan, Education handout, Resources for patients handout  (01/01/2010)    Nursing Instructions: Pap smear today

## 2010-04-04 NOTE — Consult Note (Signed)
Summary: [facility]   Imported By: Margie Billet 03/22/2010 13:38:26  _____________________________________________________________________  External Attachment:    Type:   Image     Comment:   External Document

## 2010-04-16 ENCOUNTER — Telehealth: Payer: Self-pay | Admitting: *Deleted

## 2010-04-16 NOTE — Telephone Encounter (Signed)
Pt called with c/o continued heartburn.  She is taking famotidine and has been for a long time.  In December Dr Onalee Hua added omeprazole.  That seemed to work for pt.  On last visit ( 12/12 ) you d/c omeprazole and heartburn has returned.  Will you refill?

## 2010-04-22 ENCOUNTER — Other Ambulatory Visit: Payer: Self-pay | Admitting: Internal Medicine

## 2010-04-22 DIAGNOSIS — Z8719 Personal history of other diseases of the digestive system: Secondary | ICD-10-CM

## 2010-04-22 MED ORDER — OMEPRAZOLE 20 MG PO CPDR: 20.0000 mg | DELAYED_RELEASE_CAPSULE | Freq: Every day | ORAL | Status: DC

## 2010-04-22 NOTE — Telephone Encounter (Signed)
Its okay with me  - the patient can be restarted back on omeprazole.

## 2010-05-12 ENCOUNTER — Encounter: Payer: Self-pay | Admitting: Internal Medicine

## 2010-06-18 LAB — GLUCOSE, CAPILLARY: Glucose-Capillary: 130 mg/dL — ABNORMAL HIGH (ref 70–99)

## 2010-07-19 NOTE — Discharge Summary (Signed)
NAME:  [name], [name]                     ACCOUNT NO.:  [id number]   MEDICAL RECORD NO.:  [id number]                   PATIENT TYPE:  INP   LOCATION:  0357                                 FACILITY:  [facility]   PHYSICIAN:  Lovenia Kim, D.O.              DATE OF BIRTH:  Oct 04, 1945   DATE OF ADMISSION:  01/30/2002  DATE OF DISCHARGE:  02/08/2002                                 DISCHARGE SUMMARY   REASON FOR ADMISSION:  This is a 65 year old black female who was admitted  to the emergency room with progressive shortness of breath with acute onset  4-5 days prior to this admission.  She felt like the change in her  Wellbutrin for SR to XL was what occurred at the time that she became short  of breath.  She has no coughing, hemoptysis, no evidence of lower extremity  pain, swelling, or phlebitis.  Please refer to dictated history and physical  for complete past medical history and medications, review of systems, and  exam.   HOSPITAL COURSE:  The patient was admitted to the hospital with a diagnosis  of acute pulmonary edema found on chest x-ray, morbid obesity, essential  hypertension, and atypical chest pain.  Her cardiac enzymes were negative  and she did have a heart catheterization in 1999 which showed no coronary  artery disease and an echocardiogram in January 2002, which was reported as  essentially normal.  It was felt that maybe she had some sleep apnea with  obesity, hyperventilation syndrome, and possibly pulmonary hypertension.  Her  psychiatric medications were adjusted slightly.  This really did not  have much of an impact on her symptoms of dyspnea, or clinical findings of  dyspnea.  Her room air saturations were 91% at rest on December 2, although  she felt much better on at least 2 L of oxygen via nasal cannula.  On  December 2 it was felt that she was stable, diuresed, and able to go home;  however, I did want her to get up and walk around before discharge.   When  she did get up it was noted that she became very dyspneic and thready, so of  course we did not discharge her.  I asked that cardiology come and evaluate  her at that time, and on that day of evaluation, when she got up, her blood  pressure did drop.  She became diaphoretic with increased shortness of  breath.  A spiral CT was ordered at that time which showed multiple  pulmonary emboli bilaterally.  She was started on heparin and diagnosed with  pulmonary embolism.  She also had CT of her lower extremities which were  negative for any DVTs.  Within 24 hours of being on the heparin, I felt that  she felt much better.  She was sleeping better.  O2 saturations were 96% on  2 L.  She was continued on heparin and Coumadin  until we reached a  therapeutic INR.  On December 8 her INR was 2.0.  We continued her on  heparin for another 24 hours and then on December 9, she was felt stable for  discharge home.  Her blood pressure was 143/63 with a heart rate of 66,  respiratory rate 19 and regular.  Her O2 saturation was 95% on room air.  INR was 2.1.  She had received Coumadin 10 mg, 10 mg, 15 mg, and then 8 mg  on subsequent days.   DISCHARGE MEDICATIONS:  1. Diovan 160 mg q.d.  2. Lasix 40 mg q.d.  3. Wellbutrin SR 300 mg q.d.  4. Lexapro 20 mg q.d.  5. Xanax 1 mg q.8h. as needed.  6. Seroquel 25 mg four pills at bedtime.  7. Potassium chloride 20 mEq q.d.  8. Coumadin 8 mg q.d.   DIET:  She is to maintain a low salt diet.   ACTIVITY:  No exertion outside of her home and no heavy lifting, pushing, or  pulling.   FOLLOW UP:  She will follow up with me on December 11 to recheck her pro  time and vital signs, and we will need to call Dr. Chestine Spore to schedule a  follow-up psychiatric evaluation because of the changes in her medications.   DISCHARGE DIAGNOSES:  1. Bilateral pulmonary embolism.  2. Pulmonary edema.  3. Hypertension.  4. Morbid obesity.                                                   Lovenia Kim, D.O.    ARS/MEDQ  D:  03/05/2002  T:  03/05/2002  Job:  [id number]

## 2010-07-19 NOTE — Op Note (Signed)
NAME:  [name], [name]           ACCOUNT NO.:  [id number]   MEDICAL RECORD NO.:  [id number]          PATIENT TYPE:  AMB   LOCATION:  ENDO                         FACILITY:  [facility]   PHYSICIAN:  James L. Malon Kindle., M.D.DATE OF BIRTH:  14-Oct-1945   DATE OF PROCEDURE:  11/06/2004  DATE OF DISCHARGE:                                 OPERATIVE REPORT   PROCEDURE:  Colonoscopy.   MEDICATIONS:  1.  Fentanyl 125 mcg.  2.  Versed 8 mg intravenous.   INDICATIONS:  Cancer screening.   DESCRIPTION OF PROCEDURE:  The procedure explained to the patient and  consent obtained.   With the patient in the lateral decubitus position, the Olympus pediatric  scope was inserted and advanced. The prep was excellent. The patient had a  long tortuous colon. Multiple maneuvers were required and eventually in the  supine position with abdominal pressure wave, we reached the cecum. The  ileocecal valve was seen. The scope was withdrawn. The mucosa was carefully  examined. The mucosa was normal upon withdrawal. The patient had no polyps.  No polyps were seen throughout, moderate diverticulosis of the sigmoid  colon. The scope was withdrawn.   The patient tolerated the procedure well.   ASSESSMENT:  Normal screening colonoscopy, 376.5.  2. Moderate  diverticulosis.   PLANS:  Will give her a diverticulosis information sheet. Recommend routine  followup with yearly hemoccult, possibly repeat colonoscopy in 10 years.           ______________________________  Llana Aliment Malon Kindle., M.D.     Waldron Session  D:  11/06/2004  T:  11/06/2004  Job:  [id number]   cc:   C. Ulyess Mort, M.D.  [address]  [place]  [place] [address]  Fax: [phone]

## 2010-07-19 NOTE — H&P (Signed)
NAME:  [name], [name]                     ACCOUNT NO.:  [id number]   MEDICAL RECORD NO.:  [id number]                   PATIENT TYPE:  EMS   LOCATION:  ED                                   [facility]ACILITY:  [facility]   PHYSICIAN:  Othelia Pulling, M.D.                   DATE OF BIRTH:  15-Nov-1945   DATE OF ADMISSION:  01/30/2002  DATE OF DISCHARGE:                                HISTORY & PHYSICAL   CHIEF COMPLAINT:  Shortness of breath, four or five days' duration.   HISTORY OF PRESENT ILLNESS:  A 65 year old black female, admitted through  the emergency room with progressive shortness of breath, onset about four or  five days ago following a visit to her psychiatrist, at which time she was  given Wellbutrin, which she thought had something to do with her breathing.  She also was on a number of other medications, however.  She denies any  cough or any peripheral edema, has been sleeping flat without any dyspnea.  Has had no chest pain until coming to the ER when she complained of some  discomfort in the mid-chest area.  Has had no hemoptysis nor any evidence or  history of phlebitis.  She has had a history and ongoing major problems with  obesity.  Current weight about 282 pounds.  About two years ago she was seen  in the emergency room at [facility] for atypical chest pain, at which  time a cardiac catheterization was reported normal.  She has had mild  essential hypertension for a number of years for which she is on  medications.  She denies any prior history of heart origin, except for that  noted above.   PAST MEDICAL HISTORY:  Born in [place].   FAMILY HISTORY:  Father is deceased.  Lives with her mother.  Divorced.  One  son was killed by an accident at age 23.   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  Currently unable to work due to her  health problems and has applied for Social Security disability insurance.   PAST SURGICAL HISTORY:  None except for a breast biopsy for  benign lesion.   HABITS:  No alcohol for the past year.  No history of tobacco use.   MEDICATIONS:  1. Diovan 160 q.a.m.  2. Lasix 40 mg on occasion.  3. Wellbutrin XL 150 t.i.d.  4. Lexapro 20 mg q.d.  5. Xanax 1 two or three times a day as needed.  6. Anaprox (mild pain medication).  7. More recently was given a sleeping medication which she described as     Seroquel.   REVIEW OF SYSTEMS:  EENT:  Has had occasional mild dizziness attributed to  her medications but no history of syncope.  Has had no headaches.  Her  vision and hearing has been good.  RESPIRATORY:  Denies any history of  pneumonia and no past history  of heart failure.  Has had no history of  asthma or wheezing.  CARDIOVASCULAR:  Atypical chest pain in the past with  negative cardiac catheterization.  Denies any effort pain.  Has had no prior  history of shortness of breath.  She has been aware, in the past several  days, of occasional rapid pulse.  GASTROINTESTINAL:  Major weight problem.  Does a lot of nibbling of extra food, especially at night.  Weight problem  has not been seriously addressed.  She denies any abdominal pain or food  intolerance.  She has slight watery stool attributed to medication for which  she takes a fiber medication.  GENITOURINARY:  Denies any history of urinary  tract infections.  OB/GYN:  Gravida 1, para 1.  Currently divorced.  No  history of hormone replacement.  MUSCULOSKELETAL:  Arthralgias only.  NEUROPSYCHIATRIC:  Has been treated for major depression.  Denies any  suicidal intent.  Has been hospitalized on one occasion at the behavioral  hospital.  Currently is seeing a psychiatrist on a regular basis.   PHYSICAL EXAMINATION:  GENERAL:  Very obese female who is alert,  intellectually good and cooperative.  VITAL SIGNS:  Weight 282.  Blood pressure 130/70, pulse 75 and regular.  Temperature 97.  Respirations on O2 quiet at about 20.  Prior to this,  however, respirations were  labored at about 35.  ENT:  Negative.  NECK:  No JVD could be determined.  Neck was supple.  BREASTS:  Negative.  No masses.  LUNGS:  Reveal diminished breath sounds without evidence of wheezing or  clear-cut rales.  HEART:  No murmur or gallop rhythm.  ABDOMEN:  Very obese and nontender.  No masses noted.  PELVIC:  Not done.  RECTAL:  Not done.  EXTREMITIES:  Good pedal pulses.  No pitting edema.  NEUROLOGICAL:  Negative.  No evidence of phlebitic changes in extremities.  SKIN:  Moist.  Color of mucous membranes appears to be good on oxygen.   LABORATORY DATA:  Laboratory data in the emergency room indicated at PO2 of  60 while getting 2 L of oxygen.  PCO2 was 30.  BNP 409, moderately elevated.  Cardiac enzymes within normal range.  CBC:  Negative.  Chest x-ray revealed  cardiomegaly and bilateral congestion.  Electrocardiogram revealed  nonspecific T-wave changes.  No acute ST changes or Q waves noted.   IMPRESSION:  1. Acute pulmonary edema associated with elevated BNP suggestive of cardiac     origin.  2. Morbid obesity, severe, possibly related to congestive failure and     possible related to right heart failure.  3. Essential hypertension.  4. Recent atypical chest symptoms, negative for myocardial infarction.  5. History of major depression.  6. Currently hypoxic without oxygen.  Relieved on oxygen and totally     asymptomatic.  7. Multiple medications which may be contributing factors.  Will observe 24     hours and repeat cardiac enzymes.  It is unlikely that this might be a     pulmonary embolus at this time.                                               Othelia Pulling, M.D.    Cordelia Pen  D:  01/30/2002  T:  01/31/2002  Job:  [id number]   cc:   Lovenia Kim,  D.O.  [address]  [place]  [place] [address]  Fax: [phone]

## 2010-07-19 NOTE — Discharge Summary (Signed)
[name], [name] NO.:  [id number]   MEDICAL RECORD NO.:  [id number]                    PATIENT TYPE:  IPS   LOCATION:                                       FACILITY:  [facility]   PHYSICIAN:  Geoffery Lyons, M.D.                   DATE OF BIRTH:  Nov 12, 1945   DATE OF ADMISSION:  10/28/2001  DATE OF DISCHARGE:  11/04/2001                                 DISCHARGE SUMMARY   CHIEF COMPLAINT AND PRESENT ILLNESS:  This was the first admission to [facility] for this 65 year old African-American female  voluntarily admitted, divorced.  Claimed I'm just tired.  I'm so tired.  Referred by Scarlette Calico, the therapist, experiencing suicidal thoughts.  No point in living.  Endorses two months of anhedonia, lethargy,  seclusiveness, neglect of hygiene, feels overwhelmed by multiple stressors  through the years, recent missed appointments with trials by boyfriend.  Denies any auditory or visual hallucinations.  Admits to anger.   PAST PSYCHIATRIC HISTORY:  First time at [facility].  Sees Dr. Scarlette Calico and Dr. Evelene Croon.  Wellbutrin did not work.  Effexor severe headache.  Valium after the son's death.   ALCOHOL/DRUG HISTORY:  Denies.   PAST MEDICAL HISTORY:  Diabetes mellitus, type 2, hepatitis B by history,  lipidemia, hypertension, Bell's palsy, excision breast cyst.   MEDICATIONS:  Potassium, Xenical, Diovan 160 mg daily, Xanax 0.5 mg twice a  day, Lexapro 20 mg daily, naproxen, nortriptyline 75 mg at bedtime and  Zantac.   PHYSICAL EXAMINATION:  Performed and failed to show any acute findings.   MENTAL STATUS EXAM:  Large-built, obese, fully alert female somewhat aloof  but generally cooperative.  Speech normal, clear.  Mood depressed,  underlying anger, frustration about life.  Thought processes logical, no  deficits.  Positive suicidal ideation.  No homicidal ideation.  No auditory  or visual hallucinations.  Cognition  well-preserved.   ADMISSION DIAGNOSES:   AXIS I:  1. Major depression, recurrent, severe.  2. Rule out post-traumatic stress disorder.   AXIS II:  No diagnosis.   AXIS III:  1. Hypertension.  2. Obesity.  3. Diabetes mellitus, type 2.  4. Hepatitis B.   AXIS IV:  Moderate.   AXIS V:  Global Assessment of Functioning upon admission 28; highest Global  Assessment of Functioning in the last year 66.   LABORATORY DATA:  CBC was within normal limits.  Blood chemistries were  within normal limits.  Thyroid profile was within normal limits.  Drug  screen was positive for benzodiazepines.   HOSPITAL COURSE:  She was admitted and started intensive individual and  group psychotherapy.  She was basically kept on the Xanax as needed, the  Xenical, the potassium, Diovan, Lexapro, naproxen, nortriptyline.  Xenical  was discontinued.  She was placed on Protonix.  Lexapro was increased to 30  mg per  day and he was started on Seroquel 12.5 mg at noon and 25 mg at  night.  Pamelor was then decreased to 100 mg.  As the hospitalization  progressed, from being fatigued and in bed, she started noticing some  improvement.  Did admit to anxiety.  Felt despondent.  Did not see how  things could get better.  A sense of hopelessness and helplessness.  She was  given some individual attention in group and individual basis.  She  addressed her needs in the group setting and individually.  Did admit to a  lot of loss issues in the past, __________ increased anger management,  stress management.  By September 4th though, she was much improved.  She had  identified the stressors.  She started working on how to address them, had  worked on Pharmacologist, had done some grief work, Optician, dispensing.  No  suicidal ideation.  No homicidal ideation.  She was willing to continue  treatment on an outpatient basis.  She was referred to intensive outpatient  program.   DISCHARGE DIAGNOSES:   AXIS I:  Major  depression, recurrent.   AXIS II:  No diagnosis.   AXIS III:  1. Hypertension.  2. Obesity.  3. Diabetes mellitus, type 2.   AXIS IV:  Moderate.   AXIS V:  Global Assessment of Functioning upon discharge 65.   DISCHARGE MEDICATIONS:  1. Xanax 0.5 mg twice a day.  2. Diovan 160 mg daily.  3. Naproxen.  4. Zantac 150 mg daily.  5. K-Dur 20 mEq daily.  6. Lexapro 30 mg per day.  7. Seroquel 25 mg, 1 or 2 at bedtime.  8. Pamelor 100 mg at night.   FOLLOW UP:  [facility].                                              Geoffery Lyons, M.D.     IL/MEDQ  D:  01/12/2002  T:  01/13/2002  Job:  [id number]

## 2010-07-19 NOTE — Op Note (Signed)
NAMESWEDEN, LESURE           ACCOUNT NO.:  [id number]   MEDICAL RECORD NO.:  [id number]          PATIENT TYPE:  AMB   LOCATION:  SDC                           FACILITY:  [facility]   PHYSICIAN:  Roseanna Rainbow, M.D.DATE OF BIRTH:  [birth year]   DATE OF PROCEDURE:  10/18/2004  DATE OF DISCHARGE:                                 OPERATIVE REPORT   PREOPERATIVE DIAGNOSIS:  Vulvar cyst.   POSTOPERATIVE DIAGNOSIS:  Vulvar cyst.   PROCEDURE:  Excision of vulvar cyst.   SURGEON:  Roseanna Rainbow, M.D.   ANESTHESIA:  Local, managed anesthesia care.   COMPLICATIONS:  None.   ESTIMATED BLOOD LOSS:  Minimal.   DESCRIPTION OF PROCEDURE:  The patient was taken to the operating room. The  vulva was prepped and draped in the usual sterile fashion. The subcutaneous  tissue underlying the cyst on the left labia majora was infiltrated with 2%  lidocaine. The skin was then circumscribed with a scalpel and then excised.  The defect was then cauterized for hemostasis with the Bovie. The defect was  reapproximated in two layers. The subcutaneous layer reapproximated with  interrupted sutures of 2-0 Vicryl and the skin reapproximated with  interrupted sutures of 4-0 Monocryl. There was adequate hemostasis noted.  The patient tolerated the procedure well. The patient was taken to the PACU  in stable condition.   PATHOLOGY:  Vulvar cyst.      Roseanna Rainbow, M.D.  Electronically Signed     LAJ/MEDQ  D:  10/18/2004  T:  10/18/2004  Job:  [id number]

## 2010-10-18 ENCOUNTER — Other Ambulatory Visit: Payer: Self-pay | Admitting: Internal Medicine

## 2010-10-18 ENCOUNTER — Ambulatory Visit (INDEPENDENT_AMBULATORY_CARE_PROVIDER_SITE_OTHER): Payer: Medicare FFS | Admitting: Internal Medicine

## 2010-10-18 ENCOUNTER — Ambulatory Visit (HOSPITAL_COMMUNITY)
Admission: RE | Admit: 2010-10-18 | Discharge: 2010-10-18 | Disposition: A | Discharge status: Home / Self Care | Payer: Medicare FFS | Source: Ambulatory Visit | Attending: Internal Medicine | Admitting: Internal Medicine

## 2010-10-18 ENCOUNTER — Encounter: Payer: Self-pay | Admitting: Internal Medicine

## 2010-10-18 DIAGNOSIS — Z0000 Encounter for general adult medical examination without abnormal findings: Secondary | ICD-10-CM

## 2010-10-18 DIAGNOSIS — I10 Essential (primary) hypertension: Secondary | ICD-10-CM

## 2010-10-18 DIAGNOSIS — J984 Other disorders of lung: Secondary | ICD-10-CM | POA: Insufficient documentation

## 2010-10-18 DIAGNOSIS — E785 Hyperlipidemia, unspecified: Secondary | ICD-10-CM

## 2010-10-18 DIAGNOSIS — Z09 Encounter for follow-up examination after completed treatment for conditions other than malignant neoplasm: Secondary | ICD-10-CM | POA: Insufficient documentation

## 2010-10-18 DIAGNOSIS — R911 Solitary pulmonary nodule: Secondary | ICD-10-CM

## 2010-10-18 DIAGNOSIS — K219 Gastro-esophageal reflux disease without esophagitis: Secondary | ICD-10-CM

## 2010-10-18 DIAGNOSIS — G4730 Sleep apnea, unspecified: Secondary | ICD-10-CM

## 2010-10-18 DIAGNOSIS — R32 Unspecified urinary incontinence: Secondary | ICD-10-CM

## 2010-10-18 DIAGNOSIS — F329 Major depressive disorder, single episode, unspecified: Secondary | ICD-10-CM

## 2010-10-18 DIAGNOSIS — G4733 Obstructive sleep apnea (adult) (pediatric): Secondary | ICD-10-CM

## 2010-10-18 LAB — LIPID PANEL
Cholesterol: 181 mg/dL (ref 0–200)
HDL: 42 mg/dL (ref >39)
Total CHOL/HDL Ratio: 4.3 Ratio
VLDL: 22 mg/dL (ref 0–40)

## 2010-10-18 MED ORDER — HYDROCHLOROTHIAZIDE 25 MG PO TABS: 25.0000 mg | ORAL_TABLET | Freq: Every day | ORAL | Status: DC

## 2010-10-18 MED ORDER — BUPROPION HCL ER (SR) 200 MG PO TB12: 200.0000 mg | ORAL_TABLET | Freq: Two times a day (BID) | ORAL | Status: DC

## 2010-10-18 MED ORDER — PRAVASTATIN SODIUM 20 MG PO TABS: 20.0000 mg | ORAL_TABLET | Freq: Every day | ORAL | Status: DC

## 2010-10-18 MED ORDER — AMLODIPINE BESYLATE 5 MG PO TABS: 5.0000 mg | ORAL_TABLET | Freq: Every day | ORAL | Status: DC

## 2010-10-18 MED ORDER — OMEPRAZOLE 20 MG PO CPDR: 20.0000 mg | DELAYED_RELEASE_CAPSULE | Freq: Every day | ORAL | Status: DC

## 2010-10-18 MED ORDER — OXYBUTYNIN CHLORIDE 5 MG PO TABS: 5.0000 mg | ORAL_TABLET | Freq: Two times a day (BID) | ORAL | Status: DC

## 2010-10-18 NOTE — Patient Instructions (Signed)
Please schedule a follow up appointment in 3 months or earlier if possible. Please take your medicines as prescribed.

## 2010-10-18 NOTE — Progress Notes (Signed)
  Subjective:    Patient ID: [name], female    DOB: 1946/03/02, 65 y.o.   MRN: [id number]  HPI: 65 year old woman with past medical history significant for hypertension, depression, GERD comes to the clinic for a followup visit.  She states that the medicine she has been taking for her reflux has not been helping her- She wanted to be switched to a different medicine.  She states that she has had the established diagnosis for sleep apnea and got her CPAP machine based on those study results, but it didn't work for her, so she was requesting for a repeat sleep study.  She states that she has been trying to lose weight and her knees doesn't hurt her anymore.  She requests an imaging to followup on her lung nodule. Clearly denies any symptoms of chest pain, shortness of breath, nausea, vomiting or diarrhea.    Review of Systems  Constitutional: Negative for fever, diaphoresis, activity change, appetite change and fatigue.  HENT: Positive for postnasal drip. Negative for nosebleeds, congestion, rhinorrhea and sneezing.   Eyes: Negative for visual disturbance.  Respiratory: Negative for apnea, cough, choking, chest tightness, shortness of breath, wheezing and stridor.   Cardiovascular: Negative for chest pain, palpitations and leg swelling.  Gastrointestinal: Negative for abdominal pain and constipation.  Genitourinary: Negative for dysuria, urgency, flank pain and difficulty urinating.  Musculoskeletal: Negative for arthralgias.  Neurological: Negative for dizziness, facial asymmetry and headaches.  Hematological: Negative for adenopathy.       Objective:   Physical Exam  Constitutional: She is oriented to person, place, and time. She appears well-developed and well-nourished. No distress.       Morbidly obese.  HENT:  Head: Normocephalic and atraumatic.  Mouth/Throat: No oropharyngeal exudate.  Eyes: Conjunctivae and EOM are normal. Pupils are equal, round, and reactive to  light.  Neck: Normal range of motion. Neck supple. No JVD present. No tracheal deviation present. No thyromegaly present.  Cardiovascular: Normal rate, regular rhythm, normal heart sounds and intact distal pulses.  Exam reveals no gallop and no friction rub.   No murmur heard. Pulmonary/Chest: Effort normal and breath sounds normal. No stridor. No respiratory distress. She has no wheezes. She has no rales. She exhibits no tenderness.  Abdominal: Soft. Bowel sounds are normal. She exhibits no distension and no mass. There is no tenderness. There is no rebound and no guarding.  Musculoskeletal: Normal range of motion. She exhibits no edema and no tenderness.  Lymphadenopathy:    She has no cervical adenopathy.  Neurological: She is alert and oriented to person, place, and time. She has normal reflexes. She displays normal reflexes. No cranial nerve deficit. She exhibits normal muscle tone. Coordination normal.  Skin: Skin is warm. She is not diaphoretic.          Assessment & Plan:

## 2010-10-19 LAB — COMPLETE METABOLIC PANEL WITH GFR
ALT: 21 U/L (ref 0–35)
AST: 24 U/L (ref 0–37)
Creat: 1.17 mg/dL — ABNORMAL HIGH (ref 0.50–1.10)
GFR, Est African American: 57 mL/min — ABNORMAL LOW (ref >60)
Total Bilirubin: 0.6 mg/dL (ref 0.3–1.2)

## 2010-10-21 DIAGNOSIS — Z0000 Encounter for general adult medical examination without abnormal findings: Secondary | ICD-10-CM | POA: Insufficient documentation

## 2010-10-21 NOTE — Assessment & Plan Note (Signed)
Check CMP and FLP today. Continue Pravachol at current dose for now.

## 2010-10-21 NOTE — Assessment & Plan Note (Signed)
Her CPAP machine was not working properly based on previous titrations and adjustments. Will get a repeat sleep study.

## 2010-10-21 NOTE — Assessment & Plan Note (Signed)
States that she had had a Pap smear a few months ago [facility]. We'll try to retrieve that report.

## 2010-10-21 NOTE — Assessment & Plan Note (Signed)
Lab Results  Component Value Date   NA 140 10/18/2010   K 3.6 10/18/2010   CL 101 10/18/2010   CO2 27 10/18/2010   BUN 16 10/18/2010   CREATININE 1.17* 10/18/2010   CREATININE 1.23* 11/26/2009    BP Readings from Last 3 Encounters:  10/18/10 129/88  02/11/10 109/73  01/01/10 125/86    Assessment: Hypertension control:  controlled  Progress toward goals:  unchanged Barriers to meeting goals:  no barriers identified  Plan: Hypertension treatment:  continue current medications

## 2010-10-21 NOTE — Assessment & Plan Note (Signed)
She requested an imaging to followup on her lung nodule. Patient was clearly explained that she has had sufficient followups on her lung nodule that was diagnosed in 2008 till 2010 with 3 CT scans that showed stable left lung nodule. She was educated on the expense of getting the repeat CT scan if it's not indicated. She was still very insistent on just getting a plain film. Will get a chest x-ray for her.

## 2010-10-22 NOTE — Assessment & Plan Note (Signed)
Ranitidine does not help her. Will switch her from ranitidine to omeprazole.

## 2010-11-14 ENCOUNTER — Ambulatory Visit (HOSPITAL_BASED_OUTPATIENT_CLINIC_OR_DEPARTMENT_OTHER): Payer: Medicare FFS | Attending: Internal Medicine

## 2010-11-14 DIAGNOSIS — G4733 Obstructive sleep apnea (adult) (pediatric): Secondary | ICD-10-CM | POA: Insufficient documentation

## 2010-11-14 DIAGNOSIS — G4730 Sleep apnea, unspecified: Secondary | ICD-10-CM

## 2010-11-23 DIAGNOSIS — R0609 Other forms of dyspnea: Secondary | ICD-10-CM

## 2010-11-23 DIAGNOSIS — G4733 Obstructive sleep apnea (adult) (pediatric): Secondary | ICD-10-CM

## 2010-11-23 DIAGNOSIS — R0989 Other specified symptoms and signs involving the circulatory and respiratory systems: Secondary | ICD-10-CM

## 2010-11-23 NOTE — Procedures (Signed)
NAME:  [name], [name]           ACCOUNT NO.:  [id number]  MEDICAL RECORD NO.:  [id number]          PATIENT TYPE:  OUT  LOCATION:  SLEEP CENTER                 FACILITY:  [facility]  PHYSICIAN:  Clinton D. Maple Hudson, MD, FCCP, FACPDATE OF BIRTH:  12-15-1947  DATE OF STUDY:  11/14/2010                           NOCTURNAL POLYSOMNOGRAM  REFERRING PHYSICIAN:  Tilford Pillar, MD  INDICATION FOR STUDY:  Hypersomnia with sleep apnea.  EPWORTH SLEEPINESS SCORE:  12/24, BMI 50.9.  Weight 335 pounds.  Height 68 inches.  Neck 17.5 inches.  MEDICATIONS:  Home medications are charted and reviewed.  Bedtime medication:  None.  SLEEP ARCHITECTURE:  Sleep architecture:  Split study protocol.  During the diagnostic phase, total sleep time was 124 minutes with sleep efficiency 50.9%.  Stage I was 28.6%, stage II 71.4%, stages III and REM were absent.  Sleep latency 25.5 minutes, awake after sleep onset 91.5 minutes.  Arousal index 16.5 minutes.  RESPIRATORY DATA:  Split study protocol.  Apnea/hypopnea index (AHI) 38.7 per hour.  A total of 80 events were scored including 40 obstructive apneas and 40 hypopneas.  Events were associated with non- supine sleep.  REM was absent.  CPAP was then titrated to 20 CWP, AHI 0 per hour.  At 18 CWP, AHI was 4.8 per hour.  Snoring was not eliminated at pressure of 20 CWP with heated humidifier.  She wore a medium ResMed Mirage Quattro mask with it.  OXYGEN DATA:  Moderate snoring before CPAP with oxygen desaturation to a nadir of 82% on room air.  As noted, snoring was not completely prevented at final CPAP pressures, but mean oxygen saturation was improved to 93.8% on room air.  CARDIAC DATA:  Normal sinus rhythm.  MOVEMENT-PARASOMNIA:  No significant movement disturbance.  No bathroom trips.  IMPRESSIONS-RECOMMENDATIONS: 1. Sleep architecture throughout the study was marked by recurrent     fragmentation and waking.  This did not substantially change  after     application of CPAP.  Use of a sleep medication may be helpful,     especially during the initial introduction to CPAP at home. 2. Severe obstructive sleep apnea/hypopnea syndrome, AHI 38.7 per hour     with non-supine events, moderate snoring and oxygen desaturation to     a nadir of 82% on room air. 3. Successful continuous positive airway pressure titration to a     maximum of 20 CWP.  For the sake of comfort, accepting a few     residual events, it may be practical to start home CPAP at 18 CWP     which allowed an AHI of 4.8 per hour (normal adult ranges between 0     and 5 per hour).  She wore a medium ResMed Mirage Quattro mask with     heated humidifier.  Oxygenation was improved, but snoring not     completely prevented at maximum pressures tried.     Clinton D. Maple Hudson, MD, [employer], FACP Diplomate, Biomedical engineer of Sleep Medicine Electronically Signed    CDY/MEDQ  D:  11/23/2010 10:18:11  T:  11/23/2010 10:42:28  Job:  [id number]

## 2010-12-23 ENCOUNTER — Telehealth: Payer: Self-pay | Admitting: *Deleted

## 2010-12-23 NOTE — Telephone Encounter (Signed)
Needs a letter to say that she is unstable health wise for a court case.  Pt said that her ulcers are acting up.  Pt said that she got no relief from the Famotadine.  Has been having a lot of heartburn.  Pt was given an appointment for Wednesday 12/25/2010 at 10:45 AM.  Angelina Ok, RN 12/23/2010 4:38 PM

## 2010-12-25 ENCOUNTER — Ambulatory Visit (INDEPENDENT_AMBULATORY_CARE_PROVIDER_SITE_OTHER): Payer: Medicare FFS | Admitting: Internal Medicine

## 2010-12-25 ENCOUNTER — Encounter: Payer: Self-pay | Admitting: Internal Medicine

## 2010-12-25 VITALS — BP 121/78 | HR 92 | Temp 97.6°F | Ht 67.0 in | Wt 347.4 lb

## 2010-12-25 DIAGNOSIS — Z23 Encounter for immunization: Secondary | ICD-10-CM

## 2010-12-25 DIAGNOSIS — Z598 Other problems related to housing and economic circumstances: Secondary | ICD-10-CM

## 2010-12-25 NOTE — Patient Instructions (Signed)
Your letter will be mailed to you today in the afternoon. Please, call  [name] if you do not hear anything about a C-PAP machine within 1 week. Please, make an appointment with [name] (Child psychotherapist). Please, call with any concerns. If you feel worse, please, go to ED and/or call 911.

## 2010-12-25 NOTE — Progress Notes (Deleted)
  Subjective:    Patient ID: [name], female    DOB: 01-11-1946, 65 y.o.   MRN: [id number]  HPI    Review of Systems     Objective:   Physical Exam        Assessment & Plan:

## 2010-12-25 NOTE — Progress Notes (Signed)
HPI:  1. Patient is here "because needs an extension letter" in order to postpone the court date from 01/08/11 to 02/07/11 due to severe anxiety/depression. Denies SI/HI or mania. Past Medical History  Diagnosis Date  . Depression     followed by Dr. Evelene Croon  . Diverticulosis of colon   . History of hepatitis B   . Hypertension   . Arthritis     osteoarthritis , kness.  [name] History of pulmonary embolism 01/2002  . Obesity   . Fibrocystic breast disease   . Obstructive sleep apnea     refuses CPAP.  [name] Hyperlipidemia   . Chronic kidney disease     Renal insuffucinecy, cr-1.33 in 10/2005.  [name] Left shoulder pain     2/2 fall  . Neck nodule     not erythematous, movable like cyst located at the base of posterior neck on the left side(not painful), will need follow up for   . Pulmonary nodule     rpt CT sowed resolution infectious vs inflammatory   Current Outpatient Prescriptions  Medication Sig Dispense Refill  . amLODipine (NORVASC) 5 MG tablet Take 1 tablet (5 mg total) by mouth daily.  30 tablet  6  . buPROPion (WELLBUTRIN SR) 200 MG 12 hr tablet Take 1 tablet (200 mg total) by mouth 2 (two) times daily. daily each morning and at noon  30 tablet  6  . fexofenadine (ALLEGRA) 180 MG tablet Take 180 mg by mouth daily.        . hydrochlorothiazide 25 MG tablet Take 1 tablet (25 mg total) by mouth daily.  30 tablet  6  . ibuprofen (ADVIL,MOTRIN) 200 MG tablet Take 200-400 mg by mouth every 4 (four) hours as needed. Or every 6 hours as needed       . Multiple Vitamin (MULTIVITAMIN) tablet Take 1 tablet by mouth daily.        [name] omeprazole (PRILOSEC) 20 MG capsule Take 1 capsule (20 mg total) by mouth daily.  30 capsule  6  . oxybutynin (DITROPAN) 5 MG tablet Take 1 tablet (5 mg total) by mouth 2 (two) times daily.  30 tablet  6  . pravastatin (PRAVACHOL) 20 MG tablet Take 1 tablet (20 mg total) by mouth at bedtime.  30 tablet  6   Family History  Problem Relation Age of Onset  . Hypertension       family history   History   Social History  . Marital Status: Divorced    Spouse Name: N/A    Number of Children: N/A  . Years of Education: N/A   Social History Main Topics  . Smoking status: Former Smoker    Types: Cigarettes    Quit date: 03/03/1990  . Smokeless tobacco: None  . Alcohol Use: None  . Drug Use: None  . Sexually Active: None   Other Topics Concern  . None   Social History Narrative  . None    Review of Systems: Constitutional: Denies fever, chills, diaphoresis, appetite change and fatigue.  HEENT: Denies photophobia, eye pain, redness, hearing loss, ear pain, congestion, sore throat, rhinorrhea, sneezing, mouth sores, trouble swallowing, neck pain, neck stiffness and tinnitus.  Respiratory: Denies SOB, DOE, cough, chest tightness, and wheezing.  Cardiovascular: Denies chest pain, palpitations and leg swelling.  Gastrointestinal: Denies nausea, vomiting, abdominal pain, diarrhea, constipation, blood in stool and abdominal distention.  Genitourinary: Denies dysuria, urgency, frequency, hematuria, flank pain and difficulty urinating.  Musculoskeletal: Denies myalgias, back pain, joint swelling,  arthralgias and gait problem.  Skin: Denies pallor, rash and wound.  Neurological: Denies dizziness, seizures, syncope, weakness, light-headedness, numbness and headaches.  Hematological: Denies adenopathy. Easy bruising, personal or family bleeding history  Psychiatric/Behavioral: Denies suicidal/homicidal ideation; Endorses nervousness, sleep disturbance and nervousness.  Vitals: reviewed General: alert, well-developed, and cooperative to examination.  Head: normocephalic and atraumatic.  Eyes: vision grossly intact, pupils equal, pupils round, pupils reactive to light, no injection and anicteric.  Mouth: pharynx pink and moist, no erythema, and no exudates.  Neck: supple, full ROM, no thyromegaly, no JVD, and no carotid bruits.  Lungs: normal respiratory  effort, no accessory muscle use, normal breath sounds, no crackles, and no wheezes. Heart: normal rate, regular rhythm, no murmur, no gallop, and no rub.  Abdomen: soft, non-tender, normal bowel sounds, no distention, no guarding, no rebound tenderness, no hepatomegaly, and no splenomegaly.  Msk: no joint swelling, no joint warmth, and no redness over joints.  Pulses: 2+ DP/PT pulses bilaterally Extremities: No cyanosis, clubbing, edema Neurologic: alert & oriented X3, cranial nerves II-XII intact, strength normal in all extremities, sensation intact to light touch, and gait normal.  Skin: turgor normal and no rashes.  Psych: Oriented X3, memory intact for recent and remote, normally interactive, good eye contact. Patient is anxious and depressed appearing, tearful.    Assessment & Plan:  1. OSA per [facility] done 11/17/2010 (please, see report) -C-PAPA at 18 CWP with medium respimask quatro and heated humidifier Rx-ed. -Weight management discussed.  2. HLD -copy of lab results provided to the patient -low cholesterol diet handout given to the patient.  3. Hx of lung nodule -stable per CT scans from 2008-2010. -Repeat CXR do not reveal any pathology -discussed with the patient risks associated with repeated CT radiation exposure; no further follow up needed at this time.  4. Depression, situational (brother who owns the house the patient lives in, is in the process of the house sale). -Patient is being seen and treated at the [facility] -started on fluoxetine 12/24/10 -no SI,HI or mania. -referral to SW to address housing needs.

## 2010-12-26 ENCOUNTER — Telehealth: Payer: Self-pay | Admitting: Licensed Clinical Social Worker

## 2010-12-26 ENCOUNTER — Encounter: Payer: Self-pay | Admitting: Licensed Clinical Social Worker

## 2010-12-26 NOTE — Telephone Encounter (Signed)
Called [name] per referral from Dr. Denton Meek.  She is needing letter addressed to superior court to give her an extensio of time on an appeal related to her brother petitioning ownership of home.  Agreed to write the letter and mail to her home.   Housing:  Right now she is feeling good about being able to stay in her home. She has secured [facility] to work on her case and so she is not thinking in the direction of losing her housing.  [name] is being treated by Dr. Evelene Croon psychiatrist here in [place] and they are trying to get her anti-depressant medications worked out.   Plan is to write and mail letter of support to [name].

## 2011-01-13 ENCOUNTER — Ambulatory Visit (INDEPENDENT_AMBULATORY_CARE_PROVIDER_SITE_OTHER): Payer: Medicare FFS | Admitting: Internal Medicine

## 2011-01-13 DIAGNOSIS — F329 Major depressive disorder, single episode, unspecified: Secondary | ICD-10-CM

## 2011-01-13 DIAGNOSIS — R32 Unspecified urinary incontinence: Secondary | ICD-10-CM

## 2011-01-13 DIAGNOSIS — K219 Gastro-esophageal reflux disease without esophagitis: Secondary | ICD-10-CM

## 2011-01-13 DIAGNOSIS — E785 Hyperlipidemia, unspecified: Secondary | ICD-10-CM

## 2011-01-13 DIAGNOSIS — I10 Essential (primary) hypertension: Secondary | ICD-10-CM

## 2011-01-13 MED ORDER — PRAVASTATIN SODIUM 20 MG PO TABS: 20.0000 mg | ORAL_TABLET | Freq: Every day | ORAL | Status: DC

## 2011-01-13 NOTE — Patient Instructions (Signed)
Please schedule a follow up appointment in 2-3 months. Please take your medicines as prescribed. Please get your medication bottles with your next clinic appointment.

## 2011-01-13 NOTE — Progress Notes (Signed)
  Subjective:    Patient ID: [name], female    DOB: 1945-08-11, 65 y.o.   MRN: [id number]  HPI: 65 y/o woman with PMH significant for depression, OSA on CPAP, morbid obesity comes to the clinic for a follow up visit.  She states that she has been undergoing lot of stress lately because of her house related issues. She is afraid that she might loose her house , waiting for the final hearing on 02/05/11. She states that she has been munching a lot and has gained some weight.  While waiting for her appointment she saw an advertisement on numbness in feet from diabetes. She thinks that she experiences occasional numbness and was requesting to be screened for diabetes.    Review of Systems  Constitutional: Negative for fever, appetite change and fatigue.  HENT: Negative for congestion, rhinorrhea, sneezing, neck pain and postnasal drip.   Eyes: Negative for visual disturbance.  Respiratory: Negative for apnea, cough, chest tightness and shortness of breath.   Cardiovascular: Negative for chest pain, palpitations and leg swelling.  Gastrointestinal: Negative for nausea, abdominal pain and constipation.  Genitourinary: Negative for dysuria, urgency, frequency and enuresis.  Musculoskeletal: Negative for arthralgias.  Neurological: Negative for dizziness, facial asymmetry, light-headedness and headaches.  Hematological: Negative for adenopathy.       Objective:   Physical Exam  Constitutional: She is oriented to person, place, and time. She appears well-developed and well-nourished.  HENT:  Head: Normocephalic and atraumatic.  Mouth/Throat: No oropharyngeal exudate.  Eyes: Conjunctivae and EOM are normal. Pupils are equal, round, and reactive to light.  Neck: Normal range of motion. Neck supple. No JVD present. No tracheal deviation present. No thyromegaly present.  Cardiovascular: Normal rate, regular rhythm, normal heart sounds and intact distal pulses.  Exam reveals no gallop  and no friction rub.   No murmur heard. Pulmonary/Chest: Effort normal and breath sounds normal. No stridor. No respiratory distress. She has no wheezes. She has no rales. She exhibits no tenderness.  Abdominal: Soft. Bowel sounds are normal. She exhibits no distension and no mass. There is no tenderness. There is no rebound and no guarding.  Musculoskeletal: Normal range of motion. She exhibits no edema and no tenderness.  Neurological: She is alert and oriented to person, place, and time. She has normal reflexes. She displays normal reflexes. No cranial nerve deficit.  Skin: Skin is warm.          Assessment & Plan:

## 2011-01-17 NOTE — Assessment & Plan Note (Signed)
Lab Results  Component Value Date   NA 140 10/18/2010   K 3.6 10/18/2010   CL 101 10/18/2010   CO2 27 10/18/2010   BUN 16 10/18/2010   CREATININE 1.17* 10/18/2010   CREATININE 1.23* 11/26/2009    BP Readings from Last 3 Encounters:  01/13/11 138/83  12/25/10 121/78  10/18/10 129/88    Assessment: Hypertension control:  controlled  Progress toward goals:  at goal Barriers to meeting goals:  no barriers identified  Plan: Hypertension treatment:  continue current medications

## 2011-01-17 NOTE — Assessment & Plan Note (Signed)
She follows up with Dr. Evelene Croon for management of her depression. She feels sad as she is afraid that she might loose her house but  denies any suicidal thoughts or ideations. She states that she takes Prozac for her depression. - Continue medical management. - Encouraged her not to miss her appointments with Dr. Evelene Croon.She is very much satisfied with her care there. - I spoke with Dorothe Pea to whom she is well known. She states that she would call the patient to see if we can help her by any means.

## 2011-01-17 NOTE — Assessment & Plan Note (Signed)
Stable.  Continue current treatment

## 2011-07-14 ENCOUNTER — Other Ambulatory Visit: Payer: Self-pay | Admitting: Internal Medicine

## 2011-08-11 ENCOUNTER — Ambulatory Visit (INDEPENDENT_AMBULATORY_CARE_PROVIDER_SITE_OTHER): Payer: Medicare FFS | Admitting: Internal Medicine

## 2011-08-11 ENCOUNTER — Encounter: Payer: Self-pay | Admitting: Internal Medicine

## 2011-08-11 VITALS — BP 135/81 | HR 64 | Temp 98.3°F | Ht 67.0 in | Wt 358.1 lb

## 2011-08-11 DIAGNOSIS — F32A Depression, unspecified: Secondary | ICD-10-CM

## 2011-08-11 DIAGNOSIS — F329 Major depressive disorder, single episode, unspecified: Secondary | ICD-10-CM

## 2011-08-11 DIAGNOSIS — I10 Essential (primary) hypertension: Secondary | ICD-10-CM

## 2011-08-11 DIAGNOSIS — Z0000 Encounter for general adult medical examination without abnormal findings: Secondary | ICD-10-CM

## 2011-08-11 DIAGNOSIS — R32 Unspecified urinary incontinence: Secondary | ICD-10-CM

## 2011-08-11 DIAGNOSIS — F3289 Other specified depressive episodes: Secondary | ICD-10-CM

## 2011-08-11 DIAGNOSIS — G4733 Obstructive sleep apnea (adult) (pediatric): Secondary | ICD-10-CM

## 2011-08-11 DIAGNOSIS — R209 Unspecified disturbances of skin sensation: Secondary | ICD-10-CM

## 2011-08-11 DIAGNOSIS — R200 Anesthesia of skin: Secondary | ICD-10-CM

## 2011-08-11 DIAGNOSIS — K219 Gastro-esophageal reflux disease without esophagitis: Secondary | ICD-10-CM

## 2011-08-11 DIAGNOSIS — E785 Hyperlipidemia, unspecified: Secondary | ICD-10-CM

## 2011-08-11 LAB — CBC WITH DIFFERENTIAL/PLATELET
Eosinophils Absolute: 0.1 10*3/uL (ref 0.0–0.7)
HCT: 44 % (ref 36.0–46.0)
Lymphocytes Relative: 31 % (ref 12–46)
Lymphs Abs: 2.2 10*3/uL (ref 0.7–4.0)
MCH: 25.6 pg — ABNORMAL LOW (ref 26.0–34.0)
MCV: 75.7 fL — ABNORMAL LOW (ref 78.0–100.0)
Monocytes Absolute: 0.3 10*3/uL (ref 0.1–1.0)
Monocytes Relative: 5 % (ref 3–12)
Neutrophils Relative %: 62 % (ref 43–77)

## 2011-08-11 LAB — LIPID PANEL
LDL Cholesterol: 105 mg/dL — ABNORMAL HIGH (ref 0–99)
Total CHOL/HDL Ratio: 4.6 Ratio
VLDL: 25 mg/dL (ref 0–40)

## 2011-08-11 MED ORDER — HYDROCHLOROTHIAZIDE 25 MG PO TABS: 25.0000 mg | ORAL_TABLET | Freq: Every day | ORAL | Status: DC

## 2011-08-11 MED ORDER — AMLODIPINE BESYLATE 5 MG PO TABS: 5.0000 mg | ORAL_TABLET | Freq: Every day | ORAL | Status: DC

## 2011-08-11 MED ORDER — ONE-DAILY MULTI VITAMINS PO TABS: 1.0000 | ORAL_TABLET | Freq: Every day | ORAL | Status: DC

## 2011-08-11 MED ORDER — OXYBUTYNIN CHLORIDE 5 MG PO TABS: 5.0000 mg | ORAL_TABLET | Freq: Two times a day (BID) | ORAL | Status: DC

## 2011-08-11 MED ORDER — PRAVASTATIN SODIUM 20 MG PO TABS: 20.0000 mg | ORAL_TABLET | Freq: Every day | ORAL | Status: DC

## 2011-08-11 MED ORDER — OMEPRAZOLE 20 MG PO CPDR: 20.0000 mg | DELAYED_RELEASE_CAPSULE | Freq: Every day | ORAL | Status: DC

## 2011-08-11 MED ORDER — BUPROPION HCL ER (SR) 200 MG PO TB12: 200.0000 mg | ORAL_TABLET | Freq: Two times a day (BID) | ORAL | Status: DC

## 2011-08-11 NOTE — Patient Instructions (Signed)
Please schedule a follow up appointment in 3 months . Please bring your medication bottles with your next appointment. Please take your medicines as prescribed. I will call you with your lab results if anything will be abnormal. 

## 2011-08-11 NOTE — Progress Notes (Signed)
  Subjective:    Patient ID: [name], female    DOB: 24-Jan-1946, 66 y.o.   MRN: [id number]  HPI: 66 year old woman with past medical history significant for morbid obesity, obstructive sleep apnea, hypertension, depression/anxiety comes to the clinic for a followup visit.  She is very anxious-" its all my nerves- dealing with a lot - my house, court". Patient has been having some issues with her inherited property there is very stressed but has positive attitude with the hope that things will be better.  She had to pack her stuff and move but fortunately things worked out and she is staying in her own house.  1. She reports some numbness and the spasms in the left index finger- happened twice in the week- lasted for couple of minutes. Reports occasional numbness in her right thigh when she sleeps on it.   2. She is worried that her stomach  ulcers coming back - as she feels very uncomfortbale in the belly  . She had vomited twice a month ago ,when she was packing her stuff for moving.  Denies any burning epigastric pain , acid reflux, blood in stools or hematemesis.  3. She also reports prickly feeling in the breast - lasts for half an hour to an hour. Denies any lumps, bumps or swelling.    Review of Systems  Constitutional: Negative for fever and chills.  HENT: Negative for congestion, neck stiffness and tinnitus.   Eyes: Negative for visual disturbance.  Respiratory: Negative for cough, chest tightness and shortness of breath.   Gastrointestinal: Negative for abdominal pain, constipation and blood in stool.  Genitourinary: Negative for dysuria, hematuria, enuresis and dyspareunia.  Musculoskeletal: Negative for back pain.  Neurological: Negative for tremors, speech difficulty, weakness and light-headedness.  Hematological: Negative for adenopathy.  Psychiatric/Behavioral: The patient is nervous/anxious.        Objective:   Physical Exam  Constitutional: She is oriented to  person, place, and time. She appears well-developed and well-nourished. No distress.  HENT:  Head: Normocephalic and atraumatic.  Mouth/Throat: No oropharyngeal exudate.  Eyes: Conjunctivae and EOM are normal. Pupils are equal, round, and reactive to light.  Neck: Normal range of motion. Neck supple. No JVD present. No tracheal deviation present. No thyromegaly present.  Cardiovascular: Normal rate, regular rhythm and normal heart sounds.  Exam reveals no gallop and no friction rub.   No murmur heard. Pulmonary/Chest: Effort normal and breath sounds normal. No stridor. No respiratory distress. She has no wheezes. She has no rales.       Breast- no lumps or swelling were noticed  Abdominal: Soft. Bowel sounds are normal. She exhibits no distension. There is no tenderness. There is no rebound and no guarding.  Musculoskeletal: Normal range of motion. She exhibits no edema and no tenderness.  Lymphadenopathy:    She has no cervical adenopathy.  Neurological: She is alert and oriented to person, place, and time. She has normal reflexes. No cranial nerve deficit. Coordination normal.  Skin: She is not diaphoretic.          Assessment & Plan:

## 2011-08-12 LAB — COMPLETE METABOLIC PANEL WITH GFR
ALT: 23 U/L (ref 0–35)
Albumin: 4.3 g/dL (ref 3.5–5.2)
BUN: 11 mg/dL (ref 6–23)
CO2: 25 mEq/L (ref 19–32)
Calcium: 9.7 mg/dL (ref 8.4–10.5)
Chloride: 101 mEq/L (ref 96–112)
GFR, Est African American: 59 mL/min — ABNORMAL LOW
GFR, Est Non African American: 51 mL/min — ABNORMAL LOW
Glucose, Bld: 94 mg/dL (ref 70–99)
Potassium: 3.4 mEq/L — ABNORMAL LOW (ref 3.5–5.3)
Sodium: 139 mEq/L (ref 135–145)
Total Bilirubin: 0.5 mg/dL (ref 0.3–1.2)
Total Protein: 7.8 g/dL (ref 6.0–8.3)

## 2011-08-15 DIAGNOSIS — R200 Anesthesia of skin: Secondary | ICD-10-CM | POA: Insufficient documentation

## 2011-08-15 NOTE — Assessment & Plan Note (Signed)
She was offered Pap smear but she refused-she states that she would try to [facility]. She states that she did get a notice for yearly mammogram but hasn't called for an appointment. She was advised to call to set up an appointment for the yearly mammogram

## 2011-08-15 NOTE — Assessment & Plan Note (Signed)
She complains of some weight abdominal discomfort and thinks that she is having gastric ulcers. Denies any clear-cut pain or any association of abdominal discomfort with food.  She did have some episodes of vomiting but I think that most likely anxiety related. -Continue her on omeprazole for now. -Reassured her that it doesn't appear to be gastric ulcers. She was educated on the red flags-like severe epigastric pain, nausea, vomiting or  hematemesis or melena. She was also advised not to take a lot of NSAIDs

## 2011-08-15 NOTE — Assessment & Plan Note (Signed)
She complains of numbness in the left index finger and occasional numbness in her right leg with sleeping on it. I think this is physiological but would try to look for any reversible causes. -Check TSH, vitamin B12 and electrolytes.

## 2011-08-15 NOTE — Assessment & Plan Note (Signed)
She states that advanced wanted her to talk to her insurance company , therefore she has not been able to get CPAP. Patient was advised to call her insurance company and let us know what we can do to help her. -We'll followup on that

## 2011-08-15 NOTE — Assessment & Plan Note (Signed)
Check lipid panel and CMP. 

## 2011-08-15 NOTE — Assessment & Plan Note (Signed)
Lab Results  Component Value Date   NA 139 08/11/2011   K 3.4* 08/11/2011   CL 101 08/11/2011   CO2 25 08/11/2011   BUN 11 08/11/2011   CREATININE 1.14* 08/11/2011   CREATININE 1.23* 11/26/2009    BP Readings from Last 3 Encounters:  08/11/11 135/81  01/13/11 138/83  12/25/10 121/78    Assessment: Hypertension control:  controlled  Progress toward goals:  at goal Barriers to meeting goals:  no barriers identified  Plan: Hypertension treatment:  continue current medications

## 2011-08-15 NOTE — Assessment & Plan Note (Signed)
Patient is undergoing a lot of stress but is coping well. Denies any suicidal thoughts ideations. She follows up with Dr. Evelene Croon. Continue her on current regimen

## 2011-08-20 ENCOUNTER — Telehealth: Payer: Self-pay | Admitting: Internal Medicine

## 2011-08-20 DIAGNOSIS — E876 Hypokalemia: Secondary | ICD-10-CM

## 2011-08-20 MED ORDER — POTASSIUM CHLORIDE ER 10 MEQ PO TBCR: 20.0000 meq | EXTENDED_RELEASE_TABLET | Freq: Every day | ORAL | Status: DC

## 2011-08-20 NOTE — Telephone Encounter (Signed)
Called her about the results- hypokalemia.  Would treat her with low dose potassium for 1 month that  might help with her symptoms of numbness and ? cramps

## 2011-08-21 ENCOUNTER — Telehealth: Payer: Self-pay | Admitting: *Deleted

## 2011-08-21 NOTE — Telephone Encounter (Signed)
Pt called and pharmacy never received the order for Kcl.   I realized med was sent to mail order pharmacy and it will take a week for pt to receive. I will call in one weeks supply to local pharmacy [phone] to last until mail order comes.  Does pt need to return for repeat lab work? How long should she stay on the Potassium 20 meq once a day?

## 2011-08-22 NOTE — Telephone Encounter (Signed)
She can take 20 meq Kcl every day for 30 days.  She does not need to come for repeat lab draw.   Thank you [employer] for resending the scripts.  Toy Samarin

## 2011-09-03 ENCOUNTER — Other Ambulatory Visit: Payer: Self-pay | Admitting: Internal Medicine

## 2011-09-03 DIAGNOSIS — Z1231 Encounter for screening mammogram for malignant neoplasm of breast: Secondary | ICD-10-CM

## 2011-09-24 ENCOUNTER — Ambulatory Visit (HOSPITAL_COMMUNITY)
Admission: RE | Admit: 2011-09-24 | Discharge: 2011-09-24 | Disposition: A | Discharge status: Home / Self Care | Payer: Medicare FFS | Source: Ambulatory Visit | Attending: Internal Medicine | Admitting: Internal Medicine

## 2011-09-24 DIAGNOSIS — Z1231 Encounter for screening mammogram for malignant neoplasm of breast: Secondary | ICD-10-CM | POA: Insufficient documentation

## 2011-09-30 ENCOUNTER — Other Ambulatory Visit: Payer: Self-pay | Admitting: *Deleted

## 2011-09-30 DIAGNOSIS — E876 Hypokalemia: Secondary | ICD-10-CM

## 2011-09-30 MED ORDER — POTASSIUM CHLORIDE ER 10 MEQ PO TBCR: 20.0000 meq | EXTENDED_RELEASE_TABLET | Freq: Every day | ORAL | Status: DC

## 2011-09-30 NOTE — Telephone Encounter (Signed)
Pt called and is finished with one month of Kcl. I called pt and cramping has improved while taking Kcl, She is now out of the med for 3 days and feels symptoms are coming back. Pt wants to remain on Potassium until next visit.  Will you reorder?

## 2011-09-30 NOTE — Telephone Encounter (Signed)
I will refill it.  Thanks, [employer]

## 2011-10-27 ENCOUNTER — Ambulatory Visit (INDEPENDENT_AMBULATORY_CARE_PROVIDER_SITE_OTHER): Payer: Medicare FFS | Admitting: Internal Medicine

## 2011-10-27 ENCOUNTER — Encounter: Payer: Self-pay | Admitting: Internal Medicine

## 2011-10-27 ENCOUNTER — Other Ambulatory Visit (HOSPITAL_COMMUNITY)
Admission: RE | Admit: 2011-10-27 | Discharge: 2011-10-27 | Disposition: A | Discharge status: Home / Self Care | Payer: Medicare FFS | Source: Ambulatory Visit | Attending: Internal Medicine | Admitting: Internal Medicine

## 2011-10-27 VITALS — BP 128/74 | HR 66 | Temp 97.7°F | Ht 67.0 in | Wt 364.8 lb

## 2011-10-27 DIAGNOSIS — Z0000 Encounter for general adult medical examination without abnormal findings: Secondary | ICD-10-CM

## 2011-10-27 DIAGNOSIS — R209 Unspecified disturbances of skin sensation: Secondary | ICD-10-CM

## 2011-10-27 DIAGNOSIS — I10 Essential (primary) hypertension: Secondary | ICD-10-CM

## 2011-10-27 DIAGNOSIS — E669 Obesity, unspecified: Secondary | ICD-10-CM

## 2011-10-27 DIAGNOSIS — R202 Paresthesia of skin: Secondary | ICD-10-CM

## 2011-10-27 DIAGNOSIS — E785 Hyperlipidemia, unspecified: Secondary | ICD-10-CM

## 2011-10-27 DIAGNOSIS — N289 Disorder of kidney and ureter, unspecified: Secondary | ICD-10-CM

## 2011-10-27 DIAGNOSIS — G4733 Obstructive sleep apnea (adult) (pediatric): Secondary | ICD-10-CM

## 2011-10-27 DIAGNOSIS — N259 Disorder resulting from impaired renal tubular function, unspecified: Secondary | ICD-10-CM

## 2011-10-27 DIAGNOSIS — J984 Other disorders of lung: Secondary | ICD-10-CM

## 2011-10-27 DIAGNOSIS — Z124 Encounter for screening for malignant neoplasm of cervix: Secondary | ICD-10-CM | POA: Insufficient documentation

## 2011-10-27 NOTE — Assessment & Plan Note (Signed)
We talked in length about the need for weight reduction- the then to her the necessary lifestyle changes including diet and exercise that would in turn help with her other comorbidities including hypertension,HLD and obstructive sleep apnea. - She was encouraged to enroll in exercise programs and follow healthy diet.

## 2011-10-27 NOTE — Progress Notes (Signed)
  Subjective:    Patient ID: [name], female    DOB: 06-29-1945, 66 y.o.   MRN: [id number]  HPI: A 42: Woman with past medical history significant for morbid obesity, obstructive sleep apnea on CPAP, GERD comes to the clinic for a followup visit.  She states that she has started using the CPAP machine but it causes her to have dry mouth , so she was wondering if we can do something to help her with that  She reported finger cramps when she runs out of her potassium. She reports occasional numbness in her legs.   She reports having heart burn after eating greasy substances.     Review of Systems  Constitutional: Negative for fever and appetite change.  HENT: Negative for nosebleeds, rhinorrhea, sneezing, neck pain and postnasal drip.   Eyes: Negative for visual disturbance.  Respiratory: Negative for apnea, cough and chest tightness.   Musculoskeletal: Negative for arthralgias.  Neurological: Negative for dizziness, seizures, facial asymmetry and light-headedness.  Hematological: Negative for adenopathy.  Psychiatric/Behavioral: Negative for agitation.       Objective:   Physical Exam  Constitutional: She is oriented to person, place, and time. She appears well-developed and well-nourished. No distress.       Morbidly obese  HENT:  Head: Normocephalic and atraumatic.  Mouth/Throat: No oropharyngeal exudate.  Eyes: Conjunctivae and EOM are normal. Pupils are equal, round, and reactive to light.  Neck: Normal range of motion. Neck supple. No JVD present. No tracheal deviation present. No thyromegaly present.  Cardiovascular: Normal rate, regular rhythm and intact distal pulses.  Exam reveals no gallop and no friction rub.   No murmur heard. Pulmonary/Chest: Effort normal and breath sounds normal. No stridor. No respiratory distress. She has no wheezes. She has no rales.  Abdominal: Soft. Bowel sounds are normal. She exhibits no distension. There is no tenderness. There is  no rebound.  Genitourinary:       Whitish vaginal discharge,no adnexal swelling or tenderness  Musculoskeletal: Normal range of motion. She exhibits no edema and no tenderness.  Lymphadenopathy:    She has no cervical adenopathy.  Neurological: She is alert and oriented to person, place, and time. She has normal reflexes. No cranial nerve deficit. Coordination normal.  Skin: Skin is warm. She is not diaphoretic.          Assessment & Plan:

## 2011-10-27 NOTE — Assessment & Plan Note (Signed)
She got a pap smear today. 

## 2011-10-27 NOTE — Patient Instructions (Addendum)
Please schedule a follow up appointment in 3 months or sooner if needed . Please bring your medication bottles with your next appointment. Please take your medicines as prescribed. I will call you with your lab results if anything will be abnormal. 

## 2011-10-27 NOTE — Assessment & Plan Note (Signed)
Lab Results  Component Value Date   NA 139 08/11/2011   K 3.4* 08/11/2011   CL 101 08/11/2011   CO2 25 08/11/2011   BUN 11 08/11/2011   CREATININE 1.14* 08/11/2011   CREATININE 1.23* 11/26/2009    BP Readings from Last 3 Encounters:  10/27/11 128/74  08/11/11 135/81  01/13/11 138/83    Assessment: Hypertension control:  controlled  Progress toward goals:  at goal Barriers to meeting goals:  no barriers identified  Plan: Hypertension treatment:  continue current medications

## 2011-10-28 LAB — BASIC METABOLIC PANEL WITH GFR
BUN: 15 mg/dL (ref 6–23)
CO2: 28 mEq/L (ref 19–32)
Chloride: 102 mEq/L (ref 96–112)
Creat: 1.15 mg/dL — ABNORMAL HIGH (ref 0.50–1.10)

## 2011-10-29 NOTE — Assessment & Plan Note (Signed)
Recheck BMET today to make sure that her electrolytes are good.

## 2011-10-29 NOTE — Assessment & Plan Note (Signed)
She was complaining of dry mouth with CPAP machine.  I think her dry mouth could also be related to medications like oxybutynin (anticholinergic)  that she takes for her incontinence. I discussed the medication side effects with her but unfortunately she would not be able to stop that medication. -She was advised to keep water at the bedside and use humidifier with CPAP.

## 2011-10-29 NOTE — Assessment & Plan Note (Signed)
Check BMET today 

## 2011-11-06 ENCOUNTER — Other Ambulatory Visit: Payer: Self-pay | Admitting: Internal Medicine

## 2011-11-06 DIAGNOSIS — E876 Hypokalemia: Secondary | ICD-10-CM

## 2012-01-13 ENCOUNTER — Other Ambulatory Visit: Payer: Self-pay | Admitting: Internal Medicine

## 2012-01-27 ENCOUNTER — Ambulatory Visit (INDEPENDENT_AMBULATORY_CARE_PROVIDER_SITE_OTHER): Payer: Medicare FFS | Admitting: Internal Medicine

## 2012-01-27 ENCOUNTER — Encounter: Payer: Self-pay | Admitting: Internal Medicine

## 2012-01-27 VITALS — BP 136/77 | HR 89 | Temp 98.4°F | Ht 67.0 in | Wt 365.8 lb

## 2012-01-27 DIAGNOSIS — Z0000 Encounter for general adult medical examination without abnormal findings: Secondary | ICD-10-CM

## 2012-01-27 DIAGNOSIS — I10 Essential (primary) hypertension: Secondary | ICD-10-CM

## 2012-01-27 DIAGNOSIS — G4733 Obstructive sleep apnea (adult) (pediatric): Secondary | ICD-10-CM

## 2012-01-27 DIAGNOSIS — E669 Obesity, unspecified: Secondary | ICD-10-CM

## 2012-01-27 DIAGNOSIS — Z23 Encounter for immunization: Secondary | ICD-10-CM

## 2012-01-27 NOTE — Progress Notes (Signed)
Subjective:   Patient ID: [name] female   DOB: 1945/11/11 66 y.o.   MRN: [id number]  HPI: 66 year old woman with past medical history significant for hypertension, obstructive sleep apnea, CKD stage II presents to the clinic for a followup visit  She continues to report problems with CPAP machine. She states that it causes her to have dry mouth and was requesting if she can return her CPAP back because if she doesn't use it for the required number of hours Medicare would not pay for it.  She continues to report problems with urge incontinence but oxybutynin is really helping her.  Past Medical History  Diagnosis Date  . Depression     followed by Dr. Evelene Croon  . Diverticulosis of colon   . History of hepatitis B   . Hypertension   . Arthritis     osteoarthritis , kness.  [name] History of pulmonary embolism 01/2002  . Obesity   . Fibrocystic breast disease   . Obstructive sleep apnea     refuses CPAP.  [name] Hyperlipidemia   . Chronic kidney disease     Renal insuffucinecy, cr-1.33 in 10/2005.  [name] Left shoulder pain     2/2 fall  . Neck nodule     not erythematous, movable like cyst located at the base of posterior neck on the left side(not painful), will need follow up for   . Pulmonary nodule     rpt CT sowed resolution infectious vs inflammatory   Family History  Problem Relation Age of Onset  . Hypertension      family history   History   Social History  . Marital Status: Divorced    Spouse Name: N/A    Number of Children: N/A  . Years of Education: N/A   Occupational History  . Not on file.   Social History Main Topics  . Smoking status: Former Smoker    Types: Cigarettes    Quit date: 03/03/1990  . Smokeless tobacco: Not on file  . Alcohol Use: Not on file  . Drug Use: Not on file  . Sexually Active: Not on file   Other Topics Concern  . Not on file   Social History Narrative  . No narrative on file   Review of Systems: General: Denies fever,  chills, diaphoresis, appetite change and fatigue. HEENT: Denies photophobia, eye pain, redness, hearing loss, ear pain, congestion, sore throat, rhinorrhea, sneezing, mouth sores, trouble swallowing, neck pain, neck stiffness and tinnitus. Respiratory: Denies SOB, DOE, cough, chest tightness, and wheezing. Cardiovascular: Denies to chest pain, palpitations and leg swelling. Gastrointestinal: Denies nausea, vomiting, abdominal pain, diarrhea, constipation, blood in stool and abdominal distention. Genitourinary: Denies dysuria, urgency, frequency, hematuria, flank pain and difficulty urinating. Musculoskeletal: Denies myalgias, back pain, joint swelling, arthralgias and gait problem.  Skin: Denies pallor, rash and wound. Neurological: Denies dizziness, seizures, syncope, weakness, light-headedness, numbness and headaches. Hematological: Denies adenopathy, easy bruising, personal or family bleeding history. Psychiatric/Behavioral: Denies suicidal ideation, mood changes, confusion, nervousness, sleep disturbance and agitation.    Current Outpatient Medications: Current Outpatient Prescriptions  Medication Sig Dispense Refill  . ALPRAZolam (XANAX) 1 MG tablet Take 1 mg by mouth at bedtime as needed. Take 1.5 tab by mouth everynight.      [name] amLODipine (NORVASC) 5 MG tablet Take 1 tablet (5 mg total) by mouth daily.  30 tablet  6  . buPROPion (WELLBUTRIN SR) 200 MG 12 hr tablet Take 1 tablet (200 mg total) by mouth 2 (two)  times daily. daily each morning and at noon  30 tablet  6  . fexofenadine (ALLEGRA) 180 MG tablet Take 180 mg by mouth daily.        . hydrochlorothiazide (HYDRODIURIL) 25 MG tablet Take 1 tablet (25 mg total) by mouth daily.  30 tablet  6  . ibuprofen (ADVIL,MOTRIN) 200 MG tablet Take 200-400 mg by mouth every 4 (four) hours as needed. Or every 6 hours as needed       . Multiple Vitamin (MULTIVITAMIN) tablet Take 1 tablet by mouth daily.  30 tablet  3  . omeprazole (PRILOSEC) 20  MG capsule Take 1 capsule (20 mg total) by mouth daily.  30 capsule  6  . oxybutynin (DITROPAN) 5 MG tablet Take 1 tablet (5 mg total) by mouth 2 (two) times daily.  180 tablet  2  . potassium chloride (K-DUR) 10 MEQ tablet TAKE 2 TABLETS EVERY DAY  180 tablet  PRN  . pravastatin (PRAVACHOL) 20 MG tablet TAKE 1 TABLET EVERY DAY  90 tablet  PRN    Allergies: Allergies  Allergen Reactions  . Lisinopril     REACTION: PT with cough      Objective:   Physical Exam: Filed Vitals:   01/27/12 1015  BP: 136/77  Pulse: 89  Temp: 98.4 F (36.9 C)    General: Vital signs reviewed and noted. Well-developed, well-nourished, in no acute distress; alert, appropriate and cooperative throughout examination. Head: Normocephalic, atraumatic Lungs: Normal respiratory effort. Clear to auscultation BL without crackles or wheezes. Heart: RRR. S1 and S2 normal without gallop, murmur, or rubs. Abdomen:BS normoactive. Soft, Nondistended, non-tender.  No masses or organomegaly. Extremities: No pretibial edema.     Assessment & Plan:

## 2012-01-27 NOTE — Assessment & Plan Note (Signed)
Lab Results  Component Value Date   NA 140 10/27/2011   K 4.0 10/27/2011   CL 102 10/27/2011   CO2 28 10/27/2011   BUN 15 10/27/2011   CREATININE 1.15* 10/27/2011   CREATININE 1.23* 11/26/2009    BP Readings from Last 3 Encounters:  01/27/12 136/77  10/27/11 128/74  08/11/11 135/81    Assessment: Hypertension control:  controlled  Progress toward goals:  at goal Barriers to meeting goals:  no barriers identified  Plan: Hypertension treatment:  continue current medications

## 2012-01-27 NOTE — Assessment & Plan Note (Signed)
She continues to complain of dry mouth with CPAP machine . She states that she has tried humidifier and keeps a jug of water next to her but it disrupts her sleep. I think given her severe sleep apnea, she needs to wear CPAP .She was educated on the importance of continuing to wear CPAP machine and the consequences that can result from uncontrolled sleep apnea including pulmonary HTN , persistent fatigue etc She verbalizes understanding and is willing to give another trial. I would continue to follow up on that.

## 2012-01-27 NOTE — Assessment & Plan Note (Signed)
She got a flu- shot today.  

## 2012-01-27 NOTE — Patient Instructions (Addendum)
Please schedule a follow up appointment in  3 months or sooner if needed . Please bring your medication bottles with your next appointment. Please take your medicines as prescribed.  

## 2012-01-27 NOTE — Assessment & Plan Note (Signed)
Lifestyle changes including diet and exercise were discussed during today's visit. She was motivated to participate in weight reduction programs that would in turn help in controlling her obstructive sleep apnea.

## 2012-03-19 ENCOUNTER — Other Ambulatory Visit: Payer: Self-pay | Admitting: *Deleted

## 2012-03-19 DIAGNOSIS — K219 Gastro-esophageal reflux disease without esophagitis: Secondary | ICD-10-CM

## 2012-03-19 DIAGNOSIS — F329 Major depressive disorder, single episode, unspecified: Secondary | ICD-10-CM

## 2012-03-19 DIAGNOSIS — I10 Essential (primary) hypertension: Secondary | ICD-10-CM

## 2012-03-19 MED ORDER — OMEPRAZOLE 20 MG PO CPDR: 20.0000 mg | DELAYED_RELEASE_CAPSULE | Freq: Every day | ORAL | Status: DC

## 2012-03-19 MED ORDER — HYDROCHLOROTHIAZIDE 25 MG PO TABS: 25.0000 mg | ORAL_TABLET | Freq: Every day | ORAL | Status: DC

## 2012-03-19 MED ORDER — AMLODIPINE BESYLATE 5 MG PO TABS: 5.0000 mg | ORAL_TABLET | Freq: Every day | ORAL | Status: DC

## 2012-03-19 MED ORDER — BUPROPION HCL ER (SR) 200 MG PO TB12: 200.0000 mg | ORAL_TABLET | Freq: Two times a day (BID) | ORAL | Status: DC

## 2012-03-19 NOTE — Telephone Encounter (Signed)
Flag sent to front desk pool regarding appt per Dr Butcher. 

## 2012-03-19 NOTE — Telephone Encounter (Signed)
Needs meds for 90 days per mail order.

## 2012-03-19 NOTE — Telephone Encounter (Signed)
Pls sch appt PCP end of Feb or March

## 2012-03-23 ENCOUNTER — Other Ambulatory Visit: Payer: Self-pay | Admitting: *Deleted

## 2012-03-23 DIAGNOSIS — I10 Essential (primary) hypertension: Secondary | ICD-10-CM

## 2012-03-23 MED ORDER — OXYBUTYNIN CHLORIDE 5 MG PO TABS: 5.0000 mg | ORAL_TABLET | Freq: Two times a day (BID) | ORAL | Status: DC

## 2012-03-23 NOTE — Telephone Encounter (Signed)
Has Feb appt 

## 2012-03-26 ENCOUNTER — Telehealth: Payer: Self-pay | Admitting: *Deleted

## 2012-03-26 NOTE — Telephone Encounter (Signed)
There is no mention of discontinuation in Dr. Jory Ee most recent note.  Her most recent K was normal.  I would suggest she continue taking the K+ until her visit with Dr. Dorthula Rue.   EBM

## 2012-03-26 NOTE — Telephone Encounter (Signed)
Pt has new pharmacy, needed clarification of K+ script, should pt continue the K+ until her visit 2/13 at 1415 w/ dr Dorthula Rue?

## 2012-03-31 ENCOUNTER — Telehealth: Payer: Self-pay | Admitting: *Deleted

## 2012-03-31 NOTE — Telephone Encounter (Signed)
Pharmacy called about wellbutrin, twice daily, 3 month supply should be 180 not 90, verbal ok to change

## 2012-03-31 NOTE — Telephone Encounter (Signed)
OK to change?

## 2012-04-15 ENCOUNTER — Encounter: Payer: Medicare FFS | Admitting: Internal Medicine

## 2012-04-19 ENCOUNTER — Encounter: Payer: Medicare FFS | Admitting: Internal Medicine

## 2012-04-22 ENCOUNTER — Encounter: Payer: Self-pay | Admitting: Internal Medicine

## 2012-04-22 ENCOUNTER — Ambulatory Visit (INDEPENDENT_AMBULATORY_CARE_PROVIDER_SITE_OTHER): Payer: Medicare Other | Admitting: Internal Medicine

## 2012-04-22 VITALS — BP 129/84 | HR 90 | Temp 97.0°F | Ht 67.0 in | Wt 369.5 lb

## 2012-04-22 DIAGNOSIS — E669 Obesity, unspecified: Secondary | ICD-10-CM

## 2012-04-22 DIAGNOSIS — I10 Essential (primary) hypertension: Secondary | ICD-10-CM

## 2012-04-22 DIAGNOSIS — F329 Major depressive disorder, single episode, unspecified: Secondary | ICD-10-CM

## 2012-04-22 NOTE — Assessment & Plan Note (Signed)
Blood pressure at goal. Continue current meds. Check BMET.

## 2012-04-22 NOTE — Assessment & Plan Note (Signed)
Patient continues to be very emotional and depressed, causing her to eat more and gained more weight. She was offered psychotherapy but she refused. Denies any suicidal ideations or thoughts. She follows up with Dr. Evelene Croon twice a year. Continue her on Wellbutrin for now.

## 2012-04-22 NOTE — Patient Instructions (Addendum)
General Instructions: Please schedule a follow up appointment in 3 months . Please bring your medication bottles with your next appointment. Please take your medicines as prescribed. I will call you with your lab results if anything will be abnormal.    Treatment Goals:  Goals (1 Years of Data) as of 04/22/12         As of Today 01/27/12 10/27/11 08/11/11 01/13/11     Blood Pressure    . Blood Pressure < 140/90  129/84 136/77 128/74 135/81 138/83      Progress Toward Treatment Goals:  Treatment Goal 04/22/2012  Blood pressure at goal    Self Care Goals & Plans:  Self Care Goal 04/22/2012  Manage my medications take my medicines as prescribed; bring my medications to every visit; refill my medications on time  Eat healthy foods drink diet soda or water instead of juice or soda; eat more vegetables; eat foods that are low in salt  Be physically active find an activity I enjoy       Care Management & Community Referrals:  Referral 04/22/2012  Referrals made for care management support none needed

## 2012-04-22 NOTE — Progress Notes (Signed)
Subjective:   Patient ID: [name] female   DOB: 08-Jul-1945 67 y.o.   MRN: [id number]  HPI: 67 year old woman with past medical history significant for morbid obesity, obstructive sleep apnea on CPAP, depression presents to the clinic for a followup visit.  Patient denies any complaints today but she reports that she continues to eat a lot under emotional stress from her house related issues, making her more depressed. She was very tearful during the conversation.  She follows up with psychitrist twice a year,  but doesn't believe in taking any more psychotherapy sessions.  Denies any systemic symptoms at all today.     Past Medical History  Diagnosis Date  . Depression     followed by Dr. Evelene Croon  . Diverticulosis of colon   . History of hepatitis B   . Hypertension   . Arthritis     osteoarthritis , kness.  [name] History of pulmonary embolism 01/2002  . Obesity   . Fibrocystic breast disease   . Obstructive sleep apnea     refuses CPAP.  [name] Hyperlipidemia   . Chronic kidney disease     Renal insuffucinecy, cr-1.33 in 10/2005.  [name] Left shoulder pain     2/2 fall  . Neck nodule     not erythematous, movable like cyst located at the base of posterior neck on the left side(not painful), will need follow up for   . Pulmonary nodule     rpt CT sowed resolution infectious vs inflammatory   Family History  Problem Relation Age of Onset  . Hypertension      family history   History   Social History  . Marital Status: Divorced    Spouse Name: N/A    Number of Children: N/A  . Years of Education: N/A   Occupational History  . Not on file.   Social History Main Topics  . Smoking status: Former Smoker    Types: Cigarettes    Quit date: 03/03/1990  . Smokeless tobacco: Not on file  . Alcohol Use: Not on file  . Drug Use: Not on file  . Sexually Active: Not on file   Other Topics Concern  . Not on file   Social History Narrative  . No narrative on file   Review  of Systems: General: Denies fever, chills, diaphoresis, appetite change and fatigue. HEENT: Denies photophobia, eye pain, redness, hearing loss, ear pain, congestion, sore throat, rhinorrhea, sneezing, mouth sores, trouble swallowing, neck pain, neck stiffness and tinnitus. Respiratory: Denies SOB, DOE, cough, chest tightness, and wheezing. Cardiovascular: Denies to chest pain, palpitations and leg swelling. Gastrointestinal: Denies nausea, vomiting, abdominal pain, diarrhea, constipation, blood in stool and abdominal distention. Genitourinary: Denies dysuria, urgency, frequency, hematuria, flank pain and difficulty urinating. Musculoskeletal: Denies myalgias, back pain, joint swelling, arthralgias and gait problem.  Skin: Denies pallor, rash and wound. Neurological: Denies dizziness, seizures, syncope, weakness, light-headedness, numbness and headaches. Hematological: Denies adenopathy, easy bruising, personal or family bleeding history. Psychiatric/Behavioral: Denies suicidal ideation, mood changes, confusion, nervousness, sleep disturbance and agitation.    Current Outpatient Medications: Current Outpatient Prescriptions  Medication Sig Dispense Refill  . ALPRAZolam (XANAX) 1 MG tablet Take 1 mg by mouth at bedtime as needed. Take 1.5 tab by mouth everynight.      [name] amLODipine (NORVASC) 5 MG tablet Take 1 tablet (5 mg total) by mouth daily.  90 tablet  1  . buPROPion (WELLBUTRIN SR) 200 MG 12 hr tablet Take 1 tablet (200 mg total)  by mouth 2 (two) times daily. daily each morning and at noon  90 tablet  1  . fexofenadine (ALLEGRA) 180 MG tablet Take 180 mg by mouth daily.        . hydrochlorothiazide (HYDRODIURIL) 25 MG tablet Take 1 tablet (25 mg total) by mouth daily.  90 tablet  1  . ibuprofen (ADVIL,MOTRIN) 200 MG tablet Take 200-400 mg by mouth every 4 (four) hours as needed. Or every 6 hours as needed       . Multiple Vitamin (MULTIVITAMIN) tablet Take 1 tablet by mouth daily.  30  tablet  3  . omeprazole (PRILOSEC) 20 MG capsule Take 1 capsule (20 mg total) by mouth daily.  90 capsule  1  . oxybutynin (DITROPAN) 5 MG tablet Take 1 tablet (5 mg total) by mouth 2 (two) times daily.  180 tablet  2  . potassium chloride (K-DUR) 10 MEQ tablet TAKE 2 TABLETS EVERY DAY  180 tablet  PRN  . pravastatin (PRAVACHOL) 20 MG tablet TAKE 1 TABLET EVERY DAY  90 tablet  PRN  . [DISCONTINUED] oxybutynin (DITROPAN) 5 MG tablet Take 1 tablet (5 mg total) by mouth 2 (two) times daily.  180 tablet  2   No current facility-administered medications for this visit.    Allergies: Allergies  Allergen Reactions  . Lisinopril     REACTION: PT with cough      Objective:   Physical Exam: Filed Vitals:   04/22/12 1509  BP: 129/84  Pulse: 90  Temp: 97 F (36.1 C)    General: Vital signs reviewed and noted. Well-developed, well-nourished, in no acute distress; alert, appropriate and cooperative throughout examination. Head: Normocephalic, atraumatic Lungs: Normal respiratory effort. Clear to auscultation BL without crackles or wheezes. Heart: RRR. S1 and S2 normal without gallop, murmur, or rubs. Abdomen:BS normoactive. Soft, Nondistended, non-tender.  No masses or organomegaly. Extremities: No pretibial edema.     Assessment & Plan:

## 2012-04-22 NOTE — Assessment & Plan Note (Signed)
We talked in length about weight reduction and the risk associated with obesity. Patient states that she is very depressed and doesn't want to do anything. She was offered psychotherapy sessions which she refused. She states that she would try to work on it!

## 2012-04-23 LAB — BASIC METABOLIC PANEL WITH GFR
CO2: 27 mEq/L (ref 19–32)
Calcium: 9.4 mg/dL (ref 8.4–10.5)
Glucose, Bld: 96 mg/dL (ref 70–99)
Sodium: 142 mEq/L (ref 135–145)

## 2012-08-27 ENCOUNTER — Other Ambulatory Visit: Payer: Self-pay | Admitting: *Deleted

## 2012-08-27 DIAGNOSIS — I10 Essential (primary) hypertension: Secondary | ICD-10-CM

## 2012-08-27 DIAGNOSIS — K219 Gastro-esophageal reflux disease without esophagitis: Secondary | ICD-10-CM

## 2012-08-27 MED ORDER — OMEPRAZOLE 20 MG PO CPDR: 20.0000 mg | DELAYED_RELEASE_CAPSULE | Freq: Every day | ORAL | Status: DC

## 2012-08-27 MED ORDER — AMLODIPINE BESYLATE 5 MG PO TABS: 5.0000 mg | ORAL_TABLET | Freq: Every day | ORAL | Status: DC

## 2012-08-27 MED ORDER — HYDROCHLOROTHIAZIDE 25 MG PO TABS: 25.0000 mg | ORAL_TABLET | Freq: Every day | ORAL | Status: DC

## 2012-08-27 NOTE — Telephone Encounter (Signed)
Pls ask pt to sch routine PCP appt within next 3 months

## 2012-08-27 NOTE — Telephone Encounter (Signed)
90 day supply please

## 2012-08-30 ENCOUNTER — Encounter: Payer: Self-pay | Admitting: Internal Medicine

## 2012-10-06 ENCOUNTER — Other Ambulatory Visit: Payer: Self-pay

## 2012-12-27 ENCOUNTER — Ambulatory Visit (INDEPENDENT_AMBULATORY_CARE_PROVIDER_SITE_OTHER): Payer: Medicare Other | Admitting: Internal Medicine

## 2012-12-27 ENCOUNTER — Encounter: Payer: Self-pay | Admitting: Internal Medicine

## 2012-12-27 VITALS — BP 141/89 | HR 96 | Temp 98.2°F | Ht 67.0 in | Wt 370.5 lb

## 2012-12-27 DIAGNOSIS — E669 Obesity, unspecified: Secondary | ICD-10-CM

## 2012-12-27 DIAGNOSIS — M1990 Unspecified osteoarthritis, unspecified site: Secondary | ICD-10-CM

## 2012-12-27 DIAGNOSIS — Z0000 Encounter for general adult medical examination without abnormal findings: Secondary | ICD-10-CM

## 2012-12-27 DIAGNOSIS — Z23 Encounter for immunization: Secondary | ICD-10-CM

## 2012-12-27 DIAGNOSIS — I10 Essential (primary) hypertension: Secondary | ICD-10-CM

## 2012-12-27 DIAGNOSIS — F329 Major depressive disorder, single episode, unspecified: Secondary | ICD-10-CM

## 2012-12-27 LAB — COMPLETE METABOLIC PANEL WITH GFR
ALT: 32 U/L (ref 0–35)
Albumin: 4.4 g/dL (ref 3.5–5.2)
CO2: 26 mEq/L (ref 19–32)
Calcium: 9.7 mg/dL (ref 8.4–10.5)
Chloride: 100 mEq/L (ref 96–112)
Creat: 1.11 mg/dL — ABNORMAL HIGH (ref 0.50–1.10)
GFR, Est African American: 60 mL/min
Total Protein: 7.6 g/dL (ref 6.0–8.3)

## 2012-12-27 LAB — HEMOGLOBIN A1C: Mean Plasma Glucose: 134 mg/dL — ABNORMAL HIGH (ref <117)

## 2012-12-27 NOTE — Patient Instructions (Signed)
We will check your labs today including for diabetes Please continue to see your psychiatrist Follow up with [place] in 3 months

## 2012-12-28 NOTE — Assessment & Plan Note (Signed)
-  gave pt prescription for zoster vaccine

## 2012-12-28 NOTE — Assessment & Plan Note (Signed)
We discussed weight loss and how this is affecting her health including her osteoarthritis.  She became tearful when talking about weight loss and states she has a difficult time with this due to her depression.  She enjoys eating comfort foods. I expressed the importance of eating more fruits and vegetables and exercise.  She is going through a difficult time with family members over possibly losing her house.  She has an ongoing court battle with family members. She reports polyuria without dysuria.   -check HA1C  -encouraged healthy eating habits

## 2012-12-28 NOTE — Assessment & Plan Note (Addendum)
Pt is followed by Dr. Evelene Croon and sees her twice a year. She is currently on wellbutrin and xanax for depression prescribed by Dr. Evelene Croon. She primarily takes xanax for sleep.  She was seeing a therapist but due to her insurance she will have to find another therapist.  She began tearful during our exam referring to the fact that she may lose her home and some discord with family members.  Pt does not express SI and feels it is out of her control and she will just accept what comes.  -continue to follow with Dr. Evelene Croon and therapy -continue wellbutrin and xanax per Dr. Evelene Croon

## 2012-12-28 NOTE — Progress Notes (Signed)
Patient ID: [name], female   DOB: 01/26/1946, 67 y.o.   MRN: [id number]    Subjective:   Patient ID: LUJAIN KRASZEWSKI female   DOB: 15-Oct-1945 67 y.o.   MRN: [id number]  HPI: Ms.Keydi T Wanek is a 67 y.o. AAF here for a routine follow-up visit and has a PMH outlined below. Please see problem based assessment and plan charting for further details.    Past Medical History  Diagnosis Date  . Depression     followed by Dr. Evelene Croon  . Diverticulosis of colon   . History of hepatitis B   . Hypertension   . Arthritis     osteoarthritis , kness.  [name] History of pulmonary embolism 01/2002  . Obesity   . Fibrocystic breast disease   . Obstructive sleep apnea     refuses CPAP.  [name] Hyperlipidemia   . Chronic kidney disease     Renal insuffucinecy, cr-1.33 in 10/2005.  [name] Left shoulder pain     2/2 fall  . Neck nodule     not erythematous, movable like cyst located at the base of posterior neck on the left side(not painful), will need follow up for   . Pulmonary nodule     rpt CT sowed resolution infectious vs inflammatory   Current Outpatient Prescriptions  Medication Sig Dispense Refill  . ALPRAZolam (XANAX) 1 MG tablet Take 1 mg by mouth at bedtime as needed. Take 1.5 tab by mouth everynight.      [name] amLODipine (NORVASC) 5 MG tablet Take 1 tablet (5 mg total) by mouth daily.  90 tablet  0  . buPROPion (WELLBUTRIN SR) 200 MG 12 hr tablet Take 1 tablet (200 mg total) by mouth 2 (two) times daily. daily each morning and at noon  90 tablet  1  . hydrochlorothiazide (HYDRODIURIL) 25 MG tablet Take 1 tablet (25 mg total) by mouth daily.  90 tablet  0  . Multiple Vitamin (MULTIVITAMIN) tablet Take 1 tablet by mouth daily.  30 tablet  3  . omeprazole (PRILOSEC) 20 MG capsule Take 1 capsule (20 mg total) by mouth daily.  90 capsule  0  . oxybutynin (DITROPAN) 5 MG tablet Take 1 tablet (5 mg total) by mouth 2 (two) times daily.  180 tablet  2  . potassium chloride (K-DUR) 10 MEQ  tablet TAKE 2 TABLETS EVERY DAY  180 tablet  PRN  . pravastatin (PRAVACHOL) 20 MG tablet TAKE 1 TABLET EVERY DAY  90 tablet  PRN  . [DISCONTINUED] oxybutynin (DITROPAN) 5 MG tablet Take 1 tablet (5 mg total) by mouth 2 (two) times daily.  180 tablet  2   No current facility-administered medications for this visit.   Family History  Problem Relation Age of Onset  . Hypertension      family history   History   Social History  . Marital Status: Divorced    Spouse Name: N/A    Number of Children: N/A  . Years of Education: N/A   Social History Main Topics  . Smoking status: Former Smoker    Types: Cigarettes    Quit date: 03/03/1990  . Smokeless tobacco: None  . Alcohol Use: None  . Drug Use: None  . Sexual Activity: None   Other Topics Concern  . None   Social History Narrative  . None   Review of Systems: Pertinent items are noted in HPI. Objective:  Physical Exam: Filed Vitals:   12/27/12 1524  BP: 141/89  Pulse:  96  Temp: 98.2 F (36.8 C)  TempSrc: Oral  Height: 5\' 7"  (1.702 m)  Weight: 370 lb 8 oz (168.058 kg)  SpO2: 98%   Constitutional: Vital signs reviewed.  Patient is a morbidly obese female in no acute distress and cooperative with exam.  Head: Normocephalic and atraumatic Eyes: PERRL, EOMI, conjunctivae normal, No scleral icterus.  Neck: Supple, Trachea midline .  Cardiovascular: RRR, S1 normal, S2 normal, no MRG, pulses symmetric and intact bilaterally Pulmonary/Chest: normal respiratory effort, CTAB, no wheezes, rales, or rhonchi Abdominal: Soft. Non-tender, non-distended, bowel sounds are normal, no masses, organomegaly, or guarding present.  Musculoskeletal: No joint deformities, erythema, or stiffness Neurological: A&O x3, cranial nerve II-XII are grossly intact, no focal motor deficit, sensory intact to light touch bilaterally.  Skin: Warm, dry and intact. No rash, cyanosis, or clubbing.  Psychiatric: Normal mood and affect.   Assessment &  Plan:

## 2012-12-28 NOTE — Assessment & Plan Note (Addendum)
Pt is c/o significant bilateral pain in her knees when she walks.  She states the pain bothers her only occasionally and she takes three 200mg  ibuprofen once a week for pain.  She denies any fever, chills, N/V/D/C. On exam, she was afebrile. There was no erythema, warmth, swelling, or joint effusion.    -check BMP -I had a lengthy conversation regarding the importance of weight loss and she became tearful stating that she has depression and she eats "comfort foods" and therefore weight loss has been difficult.  I acknowledged her concerns and stressed the importance of eating more fruits and vegetables and less refined carbohydrates.   -instructed pt she may also use tylenol up to 4000mg /day for OA-especially if her renal function has declined

## 2012-12-28 NOTE — Assessment & Plan Note (Signed)
BP Readings from Last 3 Encounters:  12/27/12 141/89  04/22/12 129/84  01/27/12 136/77    Lab Results  Component Value Date   NA 140 12/27/2012   K 3.6 12/27/2012   CREATININE 1.11* 12/27/2012    Assessment: Blood pressure control: controlled Progress toward BP goal:  at goal  Plan: Medications:  continue current medications Other plans: continue to monitor as SBP is slightly elevated

## 2012-12-29 NOTE — Progress Notes (Signed)
I saw and evaluated the patient.  I personally confirmed the key portions of Dr. Gill's history and exam and reviewed pertinent patient test results.  The assessment, diagnosis, and plan were formulated together and I agree with the documentation in the resident's note. 

## 2013-03-01 ENCOUNTER — Other Ambulatory Visit: Payer: Self-pay | Admitting: *Deleted

## 2013-03-01 DIAGNOSIS — K219 Gastro-esophageal reflux disease without esophagitis: Secondary | ICD-10-CM

## 2013-03-01 MED ORDER — OMEPRAZOLE 20 MG PO CPDR: 20.0000 mg | DELAYED_RELEASE_CAPSULE | Freq: Every day | ORAL | Status: DC

## 2013-03-04 ENCOUNTER — Other Ambulatory Visit: Payer: Self-pay | Admitting: *Deleted

## 2013-03-04 DIAGNOSIS — I10 Essential (primary) hypertension: Secondary | ICD-10-CM

## 2013-03-04 MED ORDER — POTASSIUM CHLORIDE ER 10 MEQ PO TBCR: 10.0000 meq | EXTENDED_RELEASE_TABLET | Freq: Two times a day (BID) | ORAL | Status: DC

## 2013-03-04 MED ORDER — PRAVASTATIN SODIUM 20 MG PO TABS: 20.0000 mg | ORAL_TABLET | Freq: Every day | ORAL | Status: DC

## 2013-03-04 MED ORDER — HYDROCHLOROTHIAZIDE 25 MG PO TABS: 25.0000 mg | ORAL_TABLET | Freq: Every day | ORAL | Status: DC

## 2013-03-07 ENCOUNTER — Other Ambulatory Visit: Payer: Self-pay | Admitting: *Deleted

## 2013-03-07 DIAGNOSIS — I10 Essential (primary) hypertension: Secondary | ICD-10-CM

## 2013-03-07 MED ORDER — AMLODIPINE BESYLATE 5 MG PO TABS: 5.0000 mg | ORAL_TABLET | Freq: Every day | ORAL | Status: DC

## 2013-03-09 ENCOUNTER — Other Ambulatory Visit: Payer: Self-pay | Admitting: *Deleted

## 2013-03-09 DIAGNOSIS — I10 Essential (primary) hypertension: Secondary | ICD-10-CM

## 2013-03-09 DIAGNOSIS — F329 Major depressive disorder, single episode, unspecified: Secondary | ICD-10-CM

## 2013-03-09 DIAGNOSIS — F32A Depression, unspecified: Secondary | ICD-10-CM

## 2013-03-09 MED ORDER — OXYBUTYNIN CHLORIDE 5 MG PO TABS: 5.0000 mg | ORAL_TABLET | Freq: Two times a day (BID) | ORAL | Status: DC

## 2013-03-09 MED ORDER — BUPROPION HCL ER (SR) 200 MG PO TB12: 200.0000 mg | ORAL_TABLET | Freq: Two times a day (BID) | ORAL | Status: DC

## 2013-03-11 ENCOUNTER — Telehealth: Payer: Self-pay | Admitting: *Deleted

## 2013-03-11 NOTE — Telephone Encounter (Signed)
Fax from [facility] - needs clarification for Wellbutrin. Is pt suppose to be on 100mg  or 200mg ; stated they recently shipped out an order for 100mg  from a different doctor. Then 1/7 received refill for 200mg  form Dr Delane GingerGill. Thanks

## 2013-03-13 NOTE — Telephone Encounter (Signed)
I think it would be helpful to inquire about the name of the physician who completed the order for 100mg --please find out if this was Dr. Evelene CroonKaur.  If it is from Dr. Evelene CroonKaur, pharmacy may disregard the prescription sent in by me because that is what we have on record here and she is also seeing Dr. Evelene CroonKaur. Thanks.

## 2013-03-14 NOTE — Telephone Encounter (Signed)
Called [facility] - stated 100mg  tabs rx was from Dr Milagros Evenerupinder Kaur; informed them to disregard Dr Shiela MayerGill's last refill 1/7 per her instruction. Thanks

## 2013-03-15 NOTE — Telephone Encounter (Signed)
Thanks

## 2013-04-04 ENCOUNTER — Encounter: Payer: Self-pay | Admitting: Internal Medicine

## 2013-04-04 ENCOUNTER — Ambulatory Visit (INDEPENDENT_AMBULATORY_CARE_PROVIDER_SITE_OTHER): Payer: Medicare HMO | Admitting: Internal Medicine

## 2013-04-04 DIAGNOSIS — Z0000 Encounter for general adult medical examination without abnormal findings: Secondary | ICD-10-CM

## 2013-04-04 DIAGNOSIS — I10 Essential (primary) hypertension: Secondary | ICD-10-CM

## 2013-04-04 DIAGNOSIS — I129 Hypertensive chronic kidney disease with stage 1 through stage 4 chronic kidney disease, or unspecified chronic kidney disease: Secondary | ICD-10-CM

## 2013-04-04 DIAGNOSIS — N182 Chronic kidney disease, stage 2 (mild): Secondary | ICD-10-CM

## 2013-04-04 DIAGNOSIS — E785 Hyperlipidemia, unspecified: Secondary | ICD-10-CM

## 2013-04-04 DIAGNOSIS — R7303 Prediabetes: Secondary | ICD-10-CM | POA: Insufficient documentation

## 2013-04-04 DIAGNOSIS — M1990 Unspecified osteoarthritis, unspecified site: Secondary | ICD-10-CM

## 2013-04-04 DIAGNOSIS — G4733 Obstructive sleep apnea (adult) (pediatric): Secondary | ICD-10-CM

## 2013-04-04 DIAGNOSIS — R7309 Other abnormal glucose: Secondary | ICD-10-CM

## 2013-04-04 DIAGNOSIS — J984 Other disorders of lung: Secondary | ICD-10-CM

## 2013-04-04 LAB — CBC WITH DIFFERENTIAL/PLATELET
BASOS ABS: 0 10*3/uL (ref 0.0–0.1)
Basophils Relative: 0 % (ref 0–1)
EOS PCT: 0 % (ref 0–5)
Eosinophils Absolute: 0 10*3/uL (ref 0.0–0.7)
HEMATOCRIT: 44.1 % (ref 36.0–46.0)
HEMOGLOBIN: 14.7 g/dL (ref 12.0–15.0)
LYMPHS ABS: 2 10*3/uL (ref 0.7–4.0)
LYMPHS PCT: 28 % (ref 12–46)
MCH: 26.3 pg (ref 26.0–34.0)
MCHC: 33.3 g/dL (ref 30.0–36.0)
MCV: 78.8 fL (ref 78.0–100.0)
MONO ABS: 0.4 10*3/uL (ref 0.1–1.0)
MONOS PCT: 5 % (ref 3–12)
NEUTROS ABS: 4.7 10*3/uL (ref 1.7–7.7)
Neutrophils Relative %: 67 % (ref 43–77)
Platelets: 247 10*3/uL (ref 150–400)
RBC: 5.6 MIL/uL — ABNORMAL HIGH (ref 3.87–5.11)
RDW: 15.5 % (ref 11.5–15.5)
WBC: 7.1 10*3/uL (ref 4.0–10.5)

## 2013-04-04 LAB — GLUCOSE, CAPILLARY: GLUCOSE-CAPILLARY: 99 mg/dL (ref 70–99)

## 2013-04-04 LAB — POCT GLYCOSYLATED HEMOGLOBIN (HGB A1C): HEMOGLOBIN A1C: 5.5

## 2013-04-04 NOTE — Assessment & Plan Note (Addendum)
-  check lipid panel -encouraged weight loss -continue pravastatin 20mg  daily  Lab Results  Component Value Date   CHOL 166 08/11/2011   HDL 36* 08/11/2011   LDLCALC 105* 08/11/2011   TRIG 124 08/11/2011   CHOLHDL 4.6 08/11/2011

## 2013-04-04 NOTE — Patient Instructions (Signed)
Thank you for your visit today; congratulations on your weight loss. Please return to the internal medicine clinic in 3 month(s) or sooner if needed.    Your current medical regimen is effective;  continue present plan and all medications. Please be sure to bring all of your medications with you to every visit. Please do not use any ibuprofen or aleve.    Should you have any new or worsening symptoms, please be sure to call the clinic at [phone].  If you believe that you are suffering from a life threatening condition or one that may result in the loss of limb or function, then you should call 911 or proceed to the nearest Emergency Department.

## 2013-04-04 NOTE — Assessment & Plan Note (Signed)
A 5 mm LLL nodule is stable for 19 months on repeat CT chest 2010.

## 2013-04-04 NOTE — Assessment & Plan Note (Signed)
Pt has asymptomatic without polyuria, polyphagia, or polydipsia.  Last HA1c was 6.3. -recheck HA1c -encouraged weight loss and exercise (she is involved with [employer] and has lost 18 lbs. since last visit)  Lab Results  Component Value Date   HGBA1C 5.5 04/04/2013

## 2013-04-04 NOTE — Progress Notes (Signed)
Subjective:   Patient ID: Patricia DunkerCelestine T [name] female    DOB: 10-18-1945 68 y.o.    MRN: [id number] Health Maintenance Due: Health Maintenance Due  Topic Date Due  . Zostavax  04/29/2007    ________________________________________________________________  HPI: [name] is a 68 y.o. female here for a routine visit.  Pt has a PMH outlined below.  Please see problem-based charting assessment and plan note for further details of medical issues addressed at today's visit.  PMH: Past Medical History  Diagnosis Date  . Depression     followed by Dr. Evelene CroonKaur  . Diverticulosis of colon   . History of hepatitis B   . Hypertension   . Arthritis     osteoarthritis , kness.  [name]. History of pulmonary embolism 01/2002  . Obesity   . Fibrocystic breast disease   . Obstructive sleep apnea     refuses CPAP.  [name]. Hyperlipidemia   . Chronic kidney disease     Renal insuffucinecy, cr-1.33 in 10/2005.  [name]. Left shoulder pain     2/2 fall  . Neck nodule     not erythematous, movable like cyst located at the base of posterior neck on the left side(not painful), will need follow up for   . Pulmonary nodule     rpt CT sowed resolution infectious vs inflammatory    Medications: Current Outpatient Prescriptions on File Prior to Visit  Medication Sig Dispense Refill  . ALPRAZolam (XANAX) 1 MG tablet Take 1 mg by mouth at bedtime as needed. Take 1.5 tab by mouth everynight.      [name]. amLODipine (NORVASC) 5 MG tablet Take 1 tablet (5 mg total) by mouth daily.  90 tablet  0  . hydrochlorothiazide (HYDRODIURIL) 25 MG tablet Take 1 tablet (25 mg total) by mouth daily.  90 tablet  4  . Multiple Vitamin (MULTIVITAMIN) tablet Take 1 tablet by mouth daily.  30 tablet  3  . omeprazole (PRILOSEC) 20 MG capsule Take 1 capsule (20 mg total) by mouth daily.  90 capsule  1  . pravastatin (PRAVACHOL) 20 MG tablet Take 1 tablet (20 mg total) by mouth daily.  90 tablet  4  . oxybutynin (DITROPAN) 5 MG tablet  Take 1 tablet (5 mg total) by mouth 2 (two) times daily.  180 tablet  1  . potassium chloride (K-DUR) 10 MEQ tablet Take 1 tablet (10 mEq total) by mouth 2 (two) times daily.  180 tablet  4  . [DISCONTINUED] oxybutynin (DITROPAN) 5 MG tablet Take 1 tablet (5 mg total) by mouth 2 (two) times daily.  180 tablet  2   No current facility-administered medications on file prior to visit.    Allergies: Allergies  Allergen Reactions  . Lisinopril     REACTION: PT with cough    FH: Family History  Problem Relation Age of Onset  . Hypertension      family history    SH: History   Social History  . Marital Status: Divorced    Spouse Name: N/A    Number of Children: N/A  . Years of Education: N/A   Social History Main Topics  . Smoking status: Former Smoker    Types: Cigarettes    Quit date: 03/03/1990  . Smokeless tobacco: Not on file  . Alcohol Use: Not on file  . Drug Use: Not on file  . Sexual Activity: Not on file   Other Topics Concern  . Not on file   Social History  Narrative  . No narrative on file    Review of Systems: Constitutional: Negative for fever, chills and weight loss.  Eyes: Negative for blurred vision.  Respiratory: Negative for cough and shortness of breath.  Cardiovascular: Negative for chest pain, palpitations and leg swelling.  Gastrointestinal: Negative for nausea, vomiting, abdominal pain, diarrhea, constipation and blood in stool.  Genitourinary: Negative for dysuria, urgency and frequency.  Musculoskeletal: Negative for myalgias and back pain.  Neurological: Negative for dizziness, weakness and headaches.     Objective:   Vital Signs: There were no vitals filed for this visit.    BP Readings from Last 3 Encounters:  12/27/12 141/89  04/22/12 129/84  01/27/12 136/77    Physical Exam: Constitutional: Vital signs reviewed.  Patient is well-developed and well-nourished in NAD and cooperative with exam.  Head: Normocephalic and  atraumatic. Eyes: PERRL, EOMI, conjunctivae nl, no scleral icterus.  Neck: Supple. Cardiovascular: RRR, no MRG. Pulmonary/Chest: normal effort, non-tender to palpation, CTAB, no wheezes, rales, or rhonchi. Abdominal: Soft. NT/ND +BS. Neurological: A&O x3, cranial nerves II-XII are grossly intact, moving all extremities. Extremities: 2+DP b/l; no pitting edema. Skin: Warm, dry and intact. No rash.  Most Recent Laboratory Results:  CMP     Component Value Date/Time   NA 140 12/27/2012 1623   K 3.6 12/27/2012 1623   CL 100 12/27/2012 1623   CO2 26 12/27/2012 1623   GLUCOSE 86 12/27/2012 1623   BUN 11 12/27/2012 1623   CREATININE 1.11* 12/27/2012 1623   CREATININE 1.23* 11/26/2009 2214   CALCIUM 9.7 12/27/2012 1623   PROT 7.6 12/27/2012 1623   ALBUMIN 4.4 12/27/2012 1623   AST 35 12/27/2012 1623   ALT 32 12/27/2012 1623   ALKPHOS 81 12/27/2012 1623   BILITOT 0.7 12/27/2012 1623    CBC    Component Value Date/Time   WBC 7.0 08/11/2011 1543   RBC 5.81* 08/11/2011 1543   HGB 14.9 08/11/2011 1543   HCT 44.0 08/11/2011 1543   PLT 275 08/11/2011 1543   MCV 75.7* 08/11/2011 1543   MCH 25.6* 08/11/2011 1543   MCHC 33.9 08/11/2011 1543   RDW 15.0 08/11/2011 1543   LYMPHSABS 2.2 08/11/2011 1543   MONOABS 0.3 08/11/2011 1543   EOSABS 0.1 08/11/2011 1543   BASOSABS 0.0 08/11/2011 1543    Lipid Panel Lab Results  Component Value Date   CHOL 166 08/11/2011   HDL 36* 08/11/2011   LDLCALC 105* 08/11/2011   TRIG 124 08/11/2011   CHOLHDL 4.6 08/11/2011    HA1C Lab Results  Component Value Date   HGBA1C 6.3* 12/27/2012    Urinalysis    Component Value Date/Time   COLORURINE YELLOW 11/26/2009 2214   APPEARANCEUR CLEAR 11/26/2009 2214   LABSPEC 1.025 11/26/2009 2214   PHURINE 6.0 11/26/2009 2214   BILIRUBINUR SMALL* 11/26/2009 2214   KETONESUR TRACE mg/dL* 1/32/4401 0272   PROTEINUR NEG mg/dL 5/36/6440 3474   UROBILINOGEN 1 11/26/2009 2214   NITRITE NEG 11/26/2009 2214   LEUKOCYTESUR NEG  11/26/2009 2214    Urine Microalbumin No results found for this basename: MICROALBUR, [id number]    Imaging N/A   Assessment & Plan:   Assessment and plan was discussed and formulated with my attending.  Patient should return to the [facility] in 3 month(s).

## 2013-04-04 NOTE — Assessment & Plan Note (Signed)
Pt received zostavax after last visit.  This is updated in the chart.

## 2013-04-04 NOTE — Assessment & Plan Note (Signed)
-  d/c ibuprofen d/t elevated creatitine -tylenol (not to exceed 4000mg  daily), weight loss, exercises to strengthen thigh muscles, capsaicin cream for pain  -consider tramadol if continuing pain and/or interfering with ADLs

## 2013-04-04 NOTE — Assessment & Plan Note (Signed)
Pt had elevated creatinine.  She has been taking ibuprofen for b/l knee pain d/t osteoarthritis.  I had told her at last visit not to take ibuprofen and apparently she misunderstood. -instructed her to d/c use of ibuprofen or other NSAIDs -encouraged her to take tylenol up to 4000mg  daily for pain and to try capsaicin cream  -recheck creatinine   Lab Results  Component Value Date   CREATININE 1.11* 12/27/2012

## 2013-04-04 NOTE — Assessment & Plan Note (Signed)
Reports not wearing CPAP because it is uncomfortable and does not fit properly.  -encouraged CPAP

## 2013-04-04 NOTE — Assessment & Plan Note (Addendum)
BP Readings from Last 3 Encounters:  12/27/12 141/89  04/22/12 129/84  01/27/12 136/77    Lab Results  Component Value Date   NA 140 12/27/2012   K 3.6 12/27/2012   CREATININE 1.11* 12/27/2012    Assessment: Blood pressure control: controlled Progress toward BP goal:  at goal Comments: Pt reports compliance with medications  Plan: Medications:  continue current medications Other plans: Pt is active with [employer] and has lost 18 lbs. since last visit; I congratulated her on this achievement and encouraged her to continue weight loss.  She has been eating canned soup however, and I explained that it is full of sodium and she could make her own soup or purchase the low-sodium variety.  Recheck Creatinine.

## 2013-04-05 LAB — COMPLETE METABOLIC PANEL WITH GFR
ALBUMIN: 4.3 g/dL (ref 3.5–5.2)
ALT: 21 U/L (ref 0–35)
AST: 28 U/L (ref 0–37)
Alkaline Phosphatase: 81 U/L (ref 39–117)
BUN: 18 mg/dL (ref 6–23)
CALCIUM: 9.8 mg/dL (ref 8.4–10.5)
CHLORIDE: 96 meq/L (ref 96–112)
CO2: 29 meq/L (ref 19–32)
Creat: 0.96 mg/dL (ref 0.50–1.10)
GFR, EST AFRICAN AMERICAN: 72 mL/min
GFR, Est Non African American: 62 mL/min
GLUCOSE: 92 mg/dL (ref 70–99)
POTASSIUM: 3.4 meq/L — AB (ref 3.5–5.3)
Sodium: 138 mEq/L (ref 135–145)
Total Bilirubin: 0.6 mg/dL (ref 0.2–1.2)
Total Protein: 7.9 g/dL (ref 6.0–8.3)

## 2013-04-05 LAB — LIPID PANEL
CHOL/HDL RATIO: 3.9 ratio
Cholesterol: 168 mg/dL (ref 0–200)
HDL: 43 mg/dL (ref >39)
LDL Cholesterol: 110 mg/dL — ABNORMAL HIGH (ref 0–99)
Triglycerides: 76 mg/dL (ref <150)
VLDL: 15 mg/dL (ref 0–40)

## 2013-04-05 NOTE — Progress Notes (Signed)
Case discussed with Dr. Gill at time of visit.  We reviewed the resident's history and exam and pertinent patient test results.  I agree with the assessment, diagnosis, and plan of care documented in the resident's note. 

## 2013-04-06 ENCOUNTER — Other Ambulatory Visit: Payer: Self-pay | Admitting: Internal Medicine

## 2013-04-06 ENCOUNTER — Ambulatory Visit: Payer: Medicare HMO

## 2013-04-06 ENCOUNTER — Telehealth: Payer: Self-pay | Admitting: *Deleted

## 2013-04-06 NOTE — Telephone Encounter (Signed)
Pt presents to front desk stating she needs something for knee pain, she states nothing has helped and she cant stand it anymore. Please advise

## 2013-04-06 NOTE — Telephone Encounter (Signed)
I had advised [name] to take tylenol for pain during her last visit.  Additionally, I told her to try capsaicin cream.  Please advise if she has done this.  Thanks.

## 2013-04-06 NOTE — Telephone Encounter (Signed)
She stated she had tried these instructions and she had no relief.

## 2013-04-08 NOTE — Telephone Encounter (Signed)
Please call in 50mg  tramadol every 6 hrs PRN pain #30.  Thanks.

## 2013-04-11 NOTE — Telephone Encounter (Signed)
Called to pharm 

## 2013-04-15 ENCOUNTER — Ambulatory Visit: Payer: Medicare Other

## 2013-05-05 ENCOUNTER — Ambulatory Visit: Payer: Medicare Other | Admitting: Internal Medicine

## 2013-07-04 ENCOUNTER — Encounter: Payer: Self-pay | Admitting: Internal Medicine

## 2013-07-04 ENCOUNTER — Ambulatory Visit (INDEPENDENT_AMBULATORY_CARE_PROVIDER_SITE_OTHER): Payer: Commercial Managed Care - HMO | Admitting: Internal Medicine

## 2013-07-04 VITALS — BP 145/91 | HR 70 | Temp 98.1°F | Ht 67.0 in | Wt 345.0 lb

## 2013-07-04 DIAGNOSIS — F329 Major depressive disorder, single episode, unspecified: Secondary | ICD-10-CM

## 2013-07-04 DIAGNOSIS — Z0000 Encounter for general adult medical examination without abnormal findings: Secondary | ICD-10-CM

## 2013-07-04 DIAGNOSIS — E876 Hypokalemia: Secondary | ICD-10-CM

## 2013-07-04 DIAGNOSIS — M1990 Unspecified osteoarthritis, unspecified site: Secondary | ICD-10-CM

## 2013-07-04 DIAGNOSIS — R911 Solitary pulmonary nodule: Secondary | ICD-10-CM

## 2013-07-04 DIAGNOSIS — J984 Other disorders of lung: Secondary | ICD-10-CM

## 2013-07-04 DIAGNOSIS — I10 Essential (primary) hypertension: Secondary | ICD-10-CM

## 2013-07-04 DIAGNOSIS — I129 Hypertensive chronic kidney disease with stage 1 through stage 4 chronic kidney disease, or unspecified chronic kidney disease: Secondary | ICD-10-CM

## 2013-07-04 DIAGNOSIS — E785 Hyperlipidemia, unspecified: Secondary | ICD-10-CM

## 2013-07-04 DIAGNOSIS — F3289 Other specified depressive episodes: Secondary | ICD-10-CM

## 2013-07-04 DIAGNOSIS — E669 Obesity, unspecified: Secondary | ICD-10-CM

## 2013-07-04 MED ORDER — PRAVASTATIN SODIUM 40 MG PO TABS: 40.0000 mg | ORAL_TABLET | Freq: Every day | ORAL | Status: DC

## 2013-07-04 NOTE — Progress Notes (Addendum)
Patient ID: [name], female   DOB: [birth year], 68 y.o.   MRN: [id number]    Subjective:   Patient ID: [name] female    DOB: [birth year] 68 y.o.    MRN: [id number] Health Maintenance Due: There are no preventive care reminders to display for this patient.  ________________________________________________________________  HPI: [name] is a 68 y.o. female here for a routine visit.  Pt has a PMH outlined below.  Please see problem-based charting assessment and plan note for further details of medical issues addressed at today's visit.  PMH: Past Medical History  Diagnosis Date  . Depression     followed by Dr. Evelene CroonKaur  . Diverticulosis of colon   . History of hepatitis B   . Hypertension   . Arthritis     osteoarthritis , kness.  [name]. History of pulmonary embolism 01/2002  . Obesity   . Fibrocystic breast disease   . Obstructive sleep apnea     refuses CPAP.  [name]. Hyperlipidemia   . Chronic kidney disease     Renal insuffucinecy, cr-1.33 in 10/2005.  [name]. Left shoulder pain     2/2 fall  . Neck nodule     not erythematous, movable like cyst located at the base of posterior neck on the left side(not painful), will need follow up for   . Pulmonary nodule     rpt CT sowed resolution infectious vs inflammatory    Medications: Current Outpatient Prescriptions on File Prior to Visit  Medication Sig Dispense Refill  . ALPRAZolam (XANAX) 1 MG tablet Take 1 mg by mouth at bedtime as needed. Take 1.5 tab by mouth everynight.      [name]. amLODipine (NORVASC) 5 MG tablet Take 1 tablet (5 mg total) by mouth daily.  90 tablet  0  . buPROPion (WELLBUTRIN SR) 200 MG 12 hr tablet Take 200 mg by mouth daily. daily each morning and at noon      . hydrochlorothiazide (HYDRODIURIL) 25 MG tablet Take 1 tablet (25 mg total) by mouth daily.  90 tablet  4  . Multiple Vitamin (MULTIVITAMIN) tablet Take 1 tablet by mouth daily.  30 tablet  3  . omeprazole (PRILOSEC) 20 MG  capsule Take 1 capsule (20 mg total) by mouth daily.  90 capsule  1  . potassium chloride (K-DUR) 10 MEQ tablet Take 1 tablet (10 mEq total) by mouth 2 (two) times daily.  180 tablet  4  . [DISCONTINUED] oxybutynin (DITROPAN) 5 MG tablet Take 1 tablet (5 mg total) by mouth 2 (two) times daily.  180 tablet  2   No current facility-administered medications on file prior to visit.    Allergies: Allergies  Allergen Reactions  . Lisinopril     REACTION: PT with cough    FH: Family History  Problem Relation Age of Onset  . Hypertension      family history    SH: History   Social History  . Marital Status: Divorced    Spouse Name: N/A    Number of Children: N/A  . Years of Education: N/A   Social History Main Topics  . Smoking status: Former Smoker    Types: Cigarettes    Quit date: 03/03/1990  . Smokeless tobacco: None  . Alcohol Use: None  . Drug Use: None  . Sexual Activity: None   Other Topics Concern  . None   Social History Narrative  . None    Review of Systems: Constitutional: Negative for fever,  chills and weight loss.  Eyes: Negative for blurred vision.  Respiratory: Negative for cough and shortness of breath.  Cardiovascular: Negative for chest pain, palpitations and leg swelling.  Gastrointestinal: Negative for nausea, vomiting, abdominal pain, diarrhea, constipation and blood in stool.  Genitourinary: Negative for dysuria, urgency and frequency.  Musculoskeletal: Negative for myalgias and back pain.  Neurological: Negative for dizziness, weakness and headaches.     Objective:   Vital Signs: Filed Vitals:   07/04/13 1538  BP: 145/91  Pulse: 70  Temp: 98.1 F (36.7 C)  TempSrc: Oral  Height: 5\' 7"  (1.702 m)  Weight: 345 lb (156.491 kg)  SpO2: 96%     BP Readings from Last 3 Encounters:  07/04/13 145/91  12/27/12 141/89  04/22/12 129/84   Physical Exam: Constitutional: Vital signs reviewed.  Patient is well-developed and well-nourished  in NAD and cooperative with exam.  Head: Normocephalic and atraumatic. Eyes: PERRL, EOMI, conjunctivae nl, no scleral icterus.  Neck: Supple. Cardiovascular: RRR, no MRG. Pulmonary/Chest: normal effort, non-tender to palpation, CTAB, no wheezes, rales, or rhonchi. Abdominal: Obese. Soft. NT/ND +BS. Neurological: A&O x3, cranial nerves II-XII are grossly intact, moving all extremities. Extremities: 2+DP b/l; no pitting edema. Skin: Warm, dry and intact. No rash.   Assessment & Plan:   Assessment and plan was discussed and formulated with my attending.  [facility] month([facility]).

## 2013-07-04 NOTE — Patient Instructions (Addendum)
Thank you for your visit today.   Please return to the internal medicine clinic in 6 month(s) or sooner if needed.      Your current medical regimen is effective;  continue present plan and take all medications as prescribed.     I have made the following additions/changes to your medications: Increased pravastatin to 40mg  in the evening (you may take 20mg  x 2 pills daily).  Please also take potassium twice daily.    I have made the following referrals for you: None   You need the following test(s) for regular health maintenance: DEXA, Mammogram    Please be sure to bring all of your medications with you to every visit; this includes herbal supplements, vitamins, eye drops, and any over-the-counter medications.    Should you have any questions regarding your medications and/or any new or worsening symptoms, please be sure to call the clinic at [phone].    If you believe that you are suffering from a life threatening condition or one that may result in the loss of limb or function, then you should call 911 or proceed to the nearest Emergency Department.     A healthy lifestyle and preventative care can promote health and wellness.   Maintain regular health, dental, and eye exams.  Eat a healthy diet. Foods like vegetables, fruits, whole grains, low-fat dairy products, and lean protein foods contain the nutrients you need without too many calories. Decrease your intake of foods high in solid fats, added sugars, and salt. Get information about a proper diet from your caregiver, if necessary.  Regular physical exercise is one of the most important things you can do for your health. Most adults should get at least 150 minutes of moderate-intensity exercise (any activity that increases your heart rate and causes you to sweat) each week. In addition, most adults need muscle-strengthening exercises on 2 or more days a week.   Maintain a healthy weight. The body mass index (BMI) is  a screening tool to identify possible weight problems. It provides an estimate of body fat based on height and weight. Your caregiver can help determine your BMI, and can help you achieve or maintain a healthy weight. For adults 20 years and older:  A BMI below 18.5 is considered underweight.  A BMI of 18.5 to 24.9 is normal.  A BMI of 25 to 29.9 is considered overweight.  A BMI of 30 and above is considered obese.

## 2013-07-05 NOTE — Assessment & Plan Note (Signed)
-  increased pravastatin to 40mg  daily

## 2013-07-05 NOTE — Assessment & Plan Note (Signed)
-  ordered mammogram -DEXA discussed but will be deferred for now given that we will have to check to see if her insurance will cover this (Mamie is checking on this)

## 2013-07-05 NOTE — Assessment & Plan Note (Addendum)
BP Readings from Last 3 Encounters:  07/04/13 145/91  12/27/12 141/89  04/22/12 129/84    Lab Results  Component Value Date   NA 138 04/04/2013   K 3.4* 04/04/2013   CREATININE 0.96 04/04/2013    Assessment: Blood pressure control: mildly elevated Progress toward BP goal:  deteriorated Comments: pt almost at goal (<140/90 - especially since pt has prediabetes and CKD); she has lost weight since last year  Plan: Medications:  continue current medications Other plans: pt is compliant with meds and is slightly above goal but would recheck in BP in 6 months and reassess need to make changes

## 2013-07-05 NOTE — Assessment & Plan Note (Addendum)
-  currently stable; pt reports losing weight as previously discussed helps with her pain

## 2013-07-05 NOTE — Assessment & Plan Note (Addendum)
-  congratulated pt on weight loss and encouraged continued loss -stressed healthy eating habits and trying to resist eating when depressed

## 2013-07-05 NOTE — Assessment & Plan Note (Signed)
-  continue to follow with behavioral health

## 2013-07-05 NOTE — Assessment & Plan Note (Signed)
Pt has LLL nodule (based on CT chest) since 2008 and had follow-up 19 months later.  Spoke with radiologist and she is slightly below recommended follow up of 24 months.  He suggested repeat especially given pt's anxiety regarding nodule. -CT chest without contast

## 2013-07-11 ENCOUNTER — Other Ambulatory Visit: Payer: Self-pay | Admitting: *Deleted

## 2013-07-11 DIAGNOSIS — K219 Gastro-esophageal reflux disease without esophagitis: Secondary | ICD-10-CM

## 2013-07-11 DIAGNOSIS — Z0000 Encounter for general adult medical examination without abnormal findings: Secondary | ICD-10-CM

## 2013-07-11 DIAGNOSIS — I10 Essential (primary) hypertension: Secondary | ICD-10-CM

## 2013-07-11 MED ORDER — POTASSIUM CHLORIDE ER 10 MEQ PO TBCR: 10.0000 meq | EXTENDED_RELEASE_TABLET | Freq: Two times a day (BID) | ORAL | Status: DC

## 2013-07-11 MED ORDER — AMLODIPINE BESYLATE 5 MG PO TABS: 5.0000 mg | ORAL_TABLET | Freq: Every day | ORAL | Status: DC

## 2013-07-11 MED ORDER — HYDROCHLOROTHIAZIDE 25 MG PO TABS: 25.0000 mg | ORAL_TABLET | Freq: Every day | ORAL | Status: DC

## 2013-07-11 MED ORDER — PRAVASTATIN SODIUM 40 MG PO TABS: 40.0000 mg | ORAL_TABLET | Freq: Every day | ORAL | Status: DC

## 2013-07-11 MED ORDER — ONE-DAILY MULTI VITAMINS PO TABS: 1.0000 | ORAL_TABLET | Freq: Every day | ORAL | Status: AC

## 2013-07-11 MED ORDER — OMEPRAZOLE 20 MG PO CPDR: 20.0000 mg | DELAYED_RELEASE_CAPSULE | Freq: Every day | ORAL | Status: DC

## 2013-07-11 NOTE — Telephone Encounter (Signed)
[facility] has [facility] [employer] so needs all Rx refilled.

## 2013-07-11 NOTE — Progress Notes (Signed)
Case discussed with Dr. Gill at the time of the visit.  We reviewed the resident's history and exam and pertinent patient test results.  I agree with the assessment, diagnosis, and plan of care documented in the resident's note. 

## 2013-07-11 NOTE — Telephone Encounter (Signed)
Pt just saw Dr Delane GingerGill but had to be reassigned to me as pt has [employer]. I have not seen pt. Dr Delane GingerGill requested 6 month F/U. Dr  Delane GingerGill - would you pls provide refills to last, if medically appropriate, until I see her in F/U in 6 months?

## 2013-07-18 ENCOUNTER — Other Ambulatory Visit: Payer: Self-pay | Admitting: *Deleted

## 2013-07-18 DIAGNOSIS — K219 Gastro-esophageal reflux disease without esophagitis: Secondary | ICD-10-CM

## 2013-07-18 DIAGNOSIS — I10 Essential (primary) hypertension: Secondary | ICD-10-CM

## 2013-07-18 NOTE — Telephone Encounter (Signed)
Dr Delane GingerGill completed 07/11/13

## 2013-07-18 NOTE — Telephone Encounter (Signed)
I talked with Dr Rogelia BogaButcher and asked for a 7 day supply of meds to be sent to [facility]. Order received and called in.

## 2013-07-18 NOTE — Telephone Encounter (Signed)
Pt called and is out of meds.   Mail delivery will not arrive for another week.  Please send in a 7 day supply to [facility].  Thank you

## 2013-07-20 ENCOUNTER — Other Ambulatory Visit (INDEPENDENT_AMBULATORY_CARE_PROVIDER_SITE_OTHER): Payer: Commercial Managed Care - HMO

## 2013-07-20 DIAGNOSIS — E876 Hypokalemia: Secondary | ICD-10-CM

## 2013-07-20 LAB — MAGNESIUM: MAGNESIUM: 2 mg/dL (ref 1.5–2.5)

## 2013-10-04 ENCOUNTER — Other Ambulatory Visit: Payer: Self-pay | Admitting: Internal Medicine

## 2013-10-04 NOTE — Telephone Encounter (Signed)
Pls sch appt with me OCt or Nov routine FU

## 2013-10-26 ENCOUNTER — Emergency Department (HOSPITAL_COMMUNITY): Payer: Medicare HMO

## 2013-10-26 ENCOUNTER — Encounter (HOSPITAL_COMMUNITY): Payer: Self-pay | Admitting: Emergency Medicine

## 2013-10-26 ENCOUNTER — Inpatient Hospital Stay (HOSPITAL_COMMUNITY)
Admission: EM | Admit: 2013-10-26 | Discharge: 2013-10-31 | DRG: 176 | Disposition: A | Discharge status: Home / Self Care | Payer: Medicare HMO | Attending: Internal Medicine | Admitting: Internal Medicine

## 2013-10-26 DIAGNOSIS — Z86711 Personal history of pulmonary embolism: Secondary | ICD-10-CM

## 2013-10-26 DIAGNOSIS — F329 Major depressive disorder, single episode, unspecified: Secondary | ICD-10-CM | POA: Diagnosis present

## 2013-10-26 DIAGNOSIS — F322 Major depressive disorder, single episode, severe without psychotic features: Secondary | ICD-10-CM | POA: Diagnosis present

## 2013-10-26 DIAGNOSIS — R911 Solitary pulmonary nodule: Secondary | ICD-10-CM | POA: Diagnosis present

## 2013-10-26 DIAGNOSIS — E119 Type 2 diabetes mellitus without complications: Secondary | ICD-10-CM | POA: Diagnosis present

## 2013-10-26 DIAGNOSIS — E785 Hyperlipidemia, unspecified: Secondary | ICD-10-CM | POA: Diagnosis present

## 2013-10-26 DIAGNOSIS — Z79899 Other long term (current) drug therapy: Secondary | ICD-10-CM

## 2013-10-26 DIAGNOSIS — R0902 Hypoxemia: Secondary | ICD-10-CM | POA: Diagnosis present

## 2013-10-26 DIAGNOSIS — Z8619 Personal history of other infectious and parasitic diseases: Secondary | ICD-10-CM

## 2013-10-26 DIAGNOSIS — R7309 Other abnormal glucose: Secondary | ICD-10-CM | POA: Diagnosis present

## 2013-10-26 DIAGNOSIS — G4733 Obstructive sleep apnea (adult) (pediatric): Secondary | ICD-10-CM | POA: Diagnosis present

## 2013-10-26 DIAGNOSIS — I2789 Other specified pulmonary heart diseases: Secondary | ICD-10-CM | POA: Diagnosis present

## 2013-10-26 DIAGNOSIS — R5381 Other malaise: Secondary | ICD-10-CM | POA: Diagnosis present

## 2013-10-26 DIAGNOSIS — Z8679 Personal history of other diseases of the circulatory system: Secondary | ICD-10-CM | POA: Diagnosis present

## 2013-10-26 DIAGNOSIS — M1990 Unspecified osteoarthritis, unspecified site: Secondary | ICD-10-CM

## 2013-10-26 DIAGNOSIS — E876 Hypokalemia: Secondary | ICD-10-CM | POA: Diagnosis present

## 2013-10-26 DIAGNOSIS — R0609 Other forms of dyspnea: Secondary | ICD-10-CM

## 2013-10-26 DIAGNOSIS — Z87891 Personal history of nicotine dependence: Secondary | ICD-10-CM | POA: Diagnosis not present

## 2013-10-26 DIAGNOSIS — F3289 Other specified depressive episodes: Secondary | ICD-10-CM | POA: Diagnosis present

## 2013-10-26 DIAGNOSIS — I129 Hypertensive chronic kidney disease with stage 1 through stage 4 chronic kidney disease, or unspecified chronic kidney disease: Secondary | ICD-10-CM | POA: Diagnosis present

## 2013-10-26 DIAGNOSIS — Z6841 Body Mass Index (BMI) 40.0 and over, adult: Secondary | ICD-10-CM

## 2013-10-26 DIAGNOSIS — K219 Gastro-esophageal reflux disease without esophagitis: Secondary | ICD-10-CM | POA: Diagnosis present

## 2013-10-26 DIAGNOSIS — I2699 Other pulmonary embolism without acute cor pulmonale: Secondary | ICD-10-CM | POA: Diagnosis present

## 2013-10-26 DIAGNOSIS — R0989 Other specified symptoms and signs involving the circulatory and respiratory systems: Secondary | ICD-10-CM

## 2013-10-26 DIAGNOSIS — Z888 Allergy status to other drugs, medicaments and biological substances status: Secondary | ICD-10-CM

## 2013-10-26 DIAGNOSIS — I379 Nonrheumatic pulmonary valve disorder, unspecified: Secondary | ICD-10-CM

## 2013-10-26 DIAGNOSIS — N182 Chronic kidney disease, stage 2 (mild): Secondary | ICD-10-CM | POA: Diagnosis present

## 2013-10-26 DIAGNOSIS — I50810 Right heart failure, unspecified: Secondary | ICD-10-CM

## 2013-10-26 DIAGNOSIS — I10 Essential (primary) hypertension: Secondary | ICD-10-CM | POA: Diagnosis present

## 2013-10-26 DIAGNOSIS — Z7401 Bed confinement status: Secondary | ICD-10-CM | POA: Diagnosis not present

## 2013-10-26 DIAGNOSIS — R000 Tachycardia, unspecified: Secondary | ICD-10-CM | POA: Diagnosis present

## 2013-10-26 DIAGNOSIS — R7303 Prediabetes: Secondary | ICD-10-CM | POA: Diagnosis present

## 2013-10-26 DIAGNOSIS — R0602 Shortness of breath: Secondary | ICD-10-CM | POA: Diagnosis present

## 2013-10-26 LAB — BASIC METABOLIC PANEL
ANION GAP: 16 — AB (ref 5–15)
BUN: 12 mg/dL (ref 6–23)
CALCIUM: 9.6 mg/dL (ref 8.4–10.5)
CO2: 23 mEq/L (ref 19–32)
Chloride: 101 mEq/L (ref 96–112)
Creatinine, Ser: 1.09 mg/dL (ref 0.50–1.10)
GFR, EST AFRICAN AMERICAN: 60 mL/min — AB (ref >90)
GFR, EST NON AFRICAN AMERICAN: 52 mL/min — AB (ref >90)
Glucose, Bld: 108 mg/dL — ABNORMAL HIGH (ref 70–99)
Potassium: 4.1 mEq/L (ref 3.7–5.3)
Sodium: 140 mEq/L (ref 137–147)

## 2013-10-26 LAB — CBC
HCT: 44 % (ref 36.0–46.0)
Hemoglobin: 15.1 g/dL — ABNORMAL HIGH (ref 12.0–15.0)
MCH: 26.9 pg (ref 26.0–34.0)
MCHC: 34.3 g/dL (ref 30.0–36.0)
MCV: 78.3 fL (ref 78.0–100.0)
PLATELETS: 167 10*3/uL (ref 150–400)
RBC: 5.62 MIL/uL — ABNORMAL HIGH (ref 3.87–5.11)
RDW: 14.3 % (ref 11.5–15.5)
WBC: 7.3 10*3/uL (ref 4.0–10.5)

## 2013-10-26 LAB — I-STAT TROPONIN, ED: TROPONIN I, POC: 0.01 ng/mL (ref 0.00–0.08)

## 2013-10-26 LAB — PRO B NATRIURETIC PEPTIDE: PRO B NATRI PEPTIDE: 1894 pg/mL — AB (ref 0–125)

## 2013-10-26 LAB — TROPONIN I: Troponin I: <0.3 ng/mL (ref <0.30)

## 2013-10-26 LAB — MRSA PCR SCREENING: MRSA by PCR: NEGATIVE

## 2013-10-26 MED ORDER — HYDROCODONE-ACETAMINOPHEN 5-325 MG PO TABS: 1.0000 | ORAL_TABLET | ORAL | Status: DC | PRN

## 2013-10-26 MED ORDER — ONDANSETRON HCL 4 MG/2ML IJ SOLN: 4.0000 mg | Freq: Four times a day (QID) | INTRAMUSCULAR | Status: DC | PRN

## 2013-10-26 MED ORDER — IOHEXOL 350 MG/ML SOLN
100.0000 mL | Freq: Once | INTRAVENOUS | Status: AC | PRN
  Administered 2013-10-26: 100 mL via INTRAVENOUS

## 2013-10-26 MED ORDER — ACETAMINOPHEN 325 MG PO TABS: 650.0000 mg | ORAL_TABLET | Freq: Four times a day (QID) | ORAL | Status: DC | PRN

## 2013-10-26 MED ORDER — PANTOPRAZOLE SODIUM 40 MG PO TBEC
40.0000 mg | DELAYED_RELEASE_TABLET | Freq: Every day | ORAL | Status: DC
  Administered 2013-10-26 – 2013-10-31 (×6): 40 mg via ORAL
  Filled 2013-10-26 (×6): qty 1

## 2013-10-26 MED ORDER — ALPRAZOLAM 0.5 MG PO TABS
1.0000 mg | ORAL_TABLET | Freq: Every evening | ORAL | Status: DC | PRN
  Administered 2013-10-26 – 2013-10-30 (×5): 1 mg via ORAL
  Filled 2013-10-26 (×5): qty 2

## 2013-10-26 MED ORDER — HEPARIN (PORCINE) IN NACL 100-0.45 UNIT/ML-% IJ SOLN
1650.0000 [IU]/h | INTRAMUSCULAR | Status: AC
  Administered 2013-10-26: 1900 [IU]/h via INTRAVENOUS
  Administered 2013-10-27: 1800 [IU]/h via INTRAVENOUS
  Administered 2013-10-27: 1650 [IU]/h via INTRAVENOUS
  Filled 2013-10-26 (×4): qty 250

## 2013-10-26 MED ORDER — SODIUM CHLORIDE 0.9 % IJ SOLN: 3.0000 mL | INTRAMUSCULAR | Status: DC | PRN

## 2013-10-26 MED ORDER — SODIUM CHLORIDE 0.9 % IV SOLN
INTRAVENOUS | Status: DC
  Administered 2013-10-29: 10 mL/h via INTRAVENOUS

## 2013-10-26 MED ORDER — SODIUM CHLORIDE 0.9 % IJ SOLN
3.0000 mL | Freq: Two times a day (BID) | INTRAMUSCULAR | Status: DC
  Administered 2013-10-27 – 2013-10-28 (×2): 3 mL via INTRAVENOUS

## 2013-10-26 MED ORDER — ONDANSETRON HCL 4 MG PO TABS: 4.0000 mg | ORAL_TABLET | Freq: Four times a day (QID) | ORAL | Status: DC | PRN

## 2013-10-26 MED ORDER — SODIUM CHLORIDE 0.9 % IJ SOLN
3.0000 mL | Freq: Two times a day (BID) | INTRAMUSCULAR | Status: DC
  Administered 2013-10-27 – 2013-10-28 (×3): 3 mL via INTRAVENOUS

## 2013-10-26 MED ORDER — DESVENLAFAXINE ER 50 MG PO TB24: 50.0000 mg | ORAL_TABLET | Freq: Every day | ORAL | Status: DC

## 2013-10-26 MED ORDER — BUPROPION HCL ER (SR) 100 MG PO TB12
200.0000 mg | ORAL_TABLET | Freq: Three times a day (TID) | ORAL | Status: DC
  Administered 2013-10-26 – 2013-10-27 (×2): 200 mg via ORAL
  Filled 2013-10-26 (×4): qty 2

## 2013-10-26 MED ORDER — SIMVASTATIN 20 MG PO TABS
20.0000 mg | ORAL_TABLET | Freq: Every day | ORAL | Status: DC
  Administered 2013-10-27 – 2013-10-30 (×4): 20 mg via ORAL
  Filled 2013-10-26 (×5): qty 1

## 2013-10-26 MED ORDER — ACETAMINOPHEN 650 MG RE SUPP: 650.0000 mg | Freq: Four times a day (QID) | RECTAL | Status: DC | PRN

## 2013-10-26 MED ORDER — HEPARIN BOLUS VIA INFUSION
5000.0000 [IU] | Freq: Once | INTRAVENOUS | Status: AC
  Administered 2013-10-26: 5000 [IU] via INTRAVENOUS
  Filled 2013-10-26: qty 5000

## 2013-10-26 MED ORDER — SODIUM CHLORIDE 0.9 % IV SOLN: 250.0000 mL | INTRAVENOUS | Status: DC | PRN

## 2013-10-26 NOTE — ED Notes (Signed)
PT monitored by pulse ox, bp cuff, and 5-lead. 

## 2013-10-26 NOTE — ED Notes (Signed)
Pt used bedside commode.

## 2013-10-26 NOTE — ED Notes (Signed)
Per pt sts that she has been SOB and chest pain since Friday. sts hx of PE. sts currently not on blood thinner.

## 2013-10-26 NOTE — Consult Note (Addendum)
ANTICOAGULATION CONSULT NOTE - Initial Consult  Pharmacy Consult for Heparin Indication: pulmonary embolism  Allergies  Allergen Reactions  . Lisinopril     REACTION: PT with cough    Patient Measurements: Height: 5' 6.93" (170 cm) Weight: 345 lb 0.3 oz (156.5 kg) IBW/kg (Calculated) : 61.44 Heparin Dosing Weight: 100.7kg  Vital Signs: Temp: 98.4 F (36.9 C) (08/26 1331) Temp src: Oral (08/26 1331) BP: 123/74 mmHg (08/26 1529) Pulse Rate: 91 (08/26 1529)  Labs:  Recent Labs  10/26/13 1339  HGB 15.1*  HCT 44.0  PLT 167  CREATININE 1.09    Estimated Creatinine Clearance: 79.7 ml/min (by C-G formula based on Cr of 1.09).   Medical History: Past Medical History  Diagnosis Date  . Depression     followed by Dr. Evelene Croon  . Diverticulosis of colon   . History of hepatitis B   . Hypertension   . Arthritis     osteoarthritis , kness.  [name] History of pulmonary embolism 01/2002  . Obesity   . Fibrocystic breast disease   . Obstructive sleep apnea     refuses CPAP.  [name] Hyperlipidemia   . Chronic kidney disease     Renal insuffucinecy, cr-1.33 in 10/2005.  [name] Left shoulder pain     2/2 fall  . Neck nodule     not erythematous, movable like cyst located at the base of posterior neck on the left side(not painful), will need follow up for   . Pulmonary nodule     rpt CT sowed resolution infectious vs inflammatory    Medications:  No anticoagulants pta  Assessment: 68yof with history of PE but not on anticoagulants pta presents to the ED with SOB and chest pain since last Friday. CT chest shows extensive bilateral PEs with large clot burden and right heart strain. She will begin IV heparin. Baseline labs wnl.  Goal of Therapy:  Heparin level 0.3-0.7 units/ml Monitor platelets by anticoagulation protocol: Yes   Plan:  1) Heparin bolus 5000 units x 1 2) Heparin drip at 1900 units/hr 3) 6 hour heparin level 4) Daily heparin level and CBC 5) Follow up transition to  oral AC  Fredrik Rigger 10/26/2013,5:54 PM

## 2013-10-26 NOTE — ED Notes (Signed)
Patient transported to CT 

## 2013-10-26 NOTE — ED Provider Notes (Signed)
CSN: [id number]     Arrival date & time 10/26/13  1319 History   First MD Initiated Contact with Patient 10/26/13 1540     Chief Complaint  Patient presents with  . Chest Pain  . Shortness of Breath     (Consider location/radiation/quality/duration/timing/severity/associated sxs/prior Treatment) HPI Comments: Patient is a 68 year old female with history of depression, diverticulosis, hepatitis B, hypertension, hyperlipidemia, who presents to the emergency department for evaluation of shortness of breath. She states this has been ongoing since Friday. She first noticed it when she was taking out the trash. Her symptoms are worse with exertion and has worsened that she can not longer walk to the restroom without feeling short of breath. She describes the sensation as if she was "running away from someone so my heart beats fast and I can't breath". No associated chest pain. She reports this feels like her prior PE. Her PE was many years ago and she was on Coumadin. She has not been on Coumadin for years. No leg swelling, recent trips, long surgeries.   The history is provided by the patient. No language interpreter was used.    Past Medical History  Diagnosis Date  . Depression     followed by Dr. Evelene Croon  . Diverticulosis of colon   . History of hepatitis B   . Hypertension   . Arthritis     osteoarthritis , kness.  [name] History of pulmonary embolism 01/2002  . Obesity   . Fibrocystic breast disease   . Obstructive sleep apnea     refuses CPAP.  [name] Hyperlipidemia   . Chronic kidney disease     Renal insuffucinecy, cr-1.33 in 10/2005.  [name] Left shoulder pain     2/2 fall  . Neck nodule     not erythematous, movable like cyst located at the base of posterior neck on the left side(not painful), will need follow up for   . Pulmonary nodule     rpt CT sowed resolution infectious vs inflammatory   Past Surgical History  Procedure Laterality Date  . Gyn surgery      s/p excison of vulvar  cyst 10/18/04 by dr. Ilene QuaChristell Constant   Family History  Problem Relation Age of Onset  . Hypertension      family history   History  Substance Use Topics  . Smoking status: Former Smoker    Types: Cigarettes    Quit date: 03/03/1990  . Smokeless tobacco: Not on file  . Alcohol Use: Not on file   OB History   Grav Para Term Preterm Abortions TAB SAB Ect Mult Living                 Review of Systems  Constitutional: Negative for fever and chills.  Respiratory: Positive for shortness of breath.   Cardiovascular: Negative for chest pain and leg swelling.  Gastrointestinal: Negative for nausea, vomiting and abdominal pain.  All other systems reviewed and are negative.     Allergies  Lisinopril  Home Medications   Prior to Admission medications   Medication Sig Start Date End Date Taking? Authorizing Provider  ALPRAZolam [name]) 1 MG tablet Take 1 mg by mouth at bedtime as needed for sleep. Take 1.5 tab by mouth everynight.   Yes Historical Provider, MD  amLODipine (NORVASC) 5 MG tablet Take 5 mg by mouth daily.   Yes Historical Provider, MD  buPROPion (WELLBUTRIN SR) 200 MG 12 hr tablet Take 200 mg by mouth 3 (three)  times daily. daily each morning and at noon 03/09/13  Yes [name], MD  Desvenlafaxine ER (KHEDEZLA) 50 MG TB24 Take 50 mg by mouth daily. Taking samples until she gets cymbalta   Yes Historical Provider, MD  hydrochlorothiazide (HYDRODIURIL) 25 MG tablet Take 1 tablet (25 mg total) by mouth daily. 07/11/13  Yes [name], MD  Multiple Vitamin (MULTIVITAMIN) tablet Take 1 tablet by mouth daily. 07/11/13  Yes [name], MD  omeprazole (PRILOSEC) 20 MG capsule Take 1 capsule (20 mg total) by mouth daily. 07/11/13 07/11/14 Yes [name], MD  potassium chloride (K-DUR) 10 MEQ tablet Take 1 tablet (10 mEq total) by mouth 2 (two) times daily. 07/11/13  Yes [name], MD  pravastatin (PRAVACHOL) 40 MG tablet Take 1 tablet (40 mg total) by  mouth daily. 07/11/13  Yes [name], MD   BP 154/96  Pulse 82  Temp(Src) 98.5 F (36.9 C) (Oral)  Resp 22  Ht 5' 7.5" (1.715 m)  Wt 330 lb 0.5 oz (149.7 kg)  BMI 50.90 kg/m2  SpO2 96% Physical Exam  Nursing note and vitals reviewed. Constitutional: She is oriented to person, place, and time. She appears well-developed and well-nourished. No distress.  obese  HENT:  Head: Normocephalic and atraumatic.  Right Ear: External ear normal.  Left Ear: External ear normal.  Nose: Nose normal.  Mouth/Throat: Oropharynx is clear and moist.  Eyes: Conjunctivae are normal.  Neck: Normal range of motion.  Cardiovascular: Normal rate, regular rhythm, normal heart sounds, intact distal pulses and normal pulses.   Pulses:      Radial pulses are 2+ on the right side, and 2+ on the left side.       Posterior tibial pulses are 2+ on the right side, and 2+ on the left side.  No calf tenderness  Pulmonary/Chest: Breath sounds normal. No stridor. Tachypnea noted. No respiratory distress.  Patient becomes very tachypneic with any sort of movement.   Abdominal: Soft. She exhibits no distension.  Musculoskeletal: Normal range of motion.  Neurological: She is alert and oriented to person, place, and time. She has normal strength.  Skin: Skin is warm and dry. She is not diaphoretic. No erythema.  Psychiatric: She has a normal mood and affect. Her behavior is normal.    ED Course  Procedures (including critical care time) Labs Review Labs Reviewed  CBC - Abnormal; Notable for the following:    RBC 5.62 (*)    Hemoglobin 15.1 (*)    All other components within normal limits  BASIC METABOLIC PANEL - Abnormal; Notable for the following:    Glucose, Bld 108 (*)    GFR calc non Af Amer 52 (*)    GFR calc Af Amer 60 (*)    Anion gap 16 (*)    All other components within normal limits  PRO B NATRIURETIC PEPTIDE - Abnormal; Notable for the following:    Pro B Natriuretic peptide (BNP) 1894.0  (*)    All other components within normal limits  MRSA PCR SCREENING  TROPONIN I  HEPARIN LEVEL (UNFRACTIONATED)  HEPARIN LEVEL (UNFRACTIONATED)  CBC  LUPUS ANTICOAGULANT PANEL  BETA-2-GLYCOPROTEIN I ABS, IGG/M/A  HOMOCYSTEINE  FACTOR 5 LEIDEN  PROTHROMBIN GENE MUTATION  CARDIOLIPIN ANTIBODIES, IGG, IGM, IGA  COMPREHENSIVE METABOLIC PANEL  TSH  TROPONIN I  TROPONIN I  HEMOGLOBIN A1C  I-STAT TROPOININ, ED    Imaging Review Dg Chest 2 View  10/26/2013   CLINICAL DATA:  Difficulty breathing and chest tightness  EXAM: CHEST  2 VIEW  COMPARISON:  October 18, 2010  FINDINGS: There is no edema or consolidation. Heart is upper normal in size with pulmonary vascularity within normal limits. No adenopathy. There is degenerative change in the thoracic spine.  IMPRESSION: No edema or consolidation.   Electronically Signed   By: Bretta Bang M.D.   On: 10/26/2013 15:19   Ct Angio Chest Pe W/cm &/or Wo Cm  10/26/2013   CLINICAL DATA:  Shortness of breath.  EXAM: CT ANGIOGRAPHY CHEST WITH CONTRAST  TECHNIQUE: Multidetector CT imaging of the chest was performed using the standard protocol during bolus administration of intravenous contrast. Multiplanar CT image reconstructions and MIPs were obtained to evaluate the vascular anatomy.  CONTRAST:  100 mL OMNIPAQUE IOHEXOL 350 MG/ML SOLN  COMPARISON:  CT chest 04/24/2008. PA and lateral chest earlier this same day.  FINDINGS: Bilateral pulmonary emboli are identified. A large embolus is seen the right main pulmonary artery. Saddle embolus at the bifurcation of the left main pulmonary artery is also seen with extensive clot in the descending interlobar arteries bilaterally and their branches. Clot in upper lobe arterial branches is also seen, better on the left. The RV to LV ratio is 1.41 consistent with right heart strain.  There is no pleural or pericardial effusion. Heart size is upper normal. No axillary, hilar or mediastinal lymphadenopathy is  identified. The lungs demonstrate mild dependent atelectasis. No evidence of pulmonary infarct is seen. Previously seen 0.5 cm left lower lobe pulmonary nodule is somewhat difficult to visualize on this exam but is thought to be on image 60 and stable in size.  Incidentally imaged upper abdomen shows partial visualization of a large left renal cyst. Imaged intra-abdominal contents are otherwise unremarkable. Thoracic spondylosis is noted. No lytic or sclerotic bony lesion is identified.  Review of the MIP images confirms the above findings.  IMPRESSION: The study is positive for extensive bilateral pulmonary embolus with evidence of right heart strain consistent with at least submassive PE. The presence of right heart strain has been associated with increased risk of morbidity and mortality. Consultation with Pulmonary and Critical Care Medicine is recommended.  0.5 cm left lower lobe pulmonary nodule is stable in size consistent with benignity.  Critical Value/emergent results were called by telephone at the time of interpretation on 10/26/2013 at 5:50 pm to [facility] who verbally acknowledged these results.   Electronically Signed   By: Drusilla Kanner M.D.   On: 10/26/2013 17:52     EKG Interpretation None      6:04 PM Discussed case with Critical Care who recommends internal medicine admit and to order echo, cardiology consult.     MDM   Final diagnoses:  Bilateral pulmonary embolism   Patient presents to ED with shortness of breath. Patient with extensive bilateral pulmonary embolus with evidence of right heart strain consistent with at least submassive PE. Critical care was consulted and recommend cardiology consult with echo. Cardiology was consulted and state generally right heart strain is an incidental finding. They will consult on patient if echo is abnormal. Patient admitted to internal medicine. Admission and consultation are appreciated. Patient is hemodynamically stable at  this time. Dr. Gwendolyn Grant evaluated patient and agrees with plan. Patient / Family / Caregiver informed of clinical course, understand medical decision-making process, and agree with plan.     Mora Bellman, PA-C 10/27/13 [phone]

## 2013-10-26 NOTE — ED Notes (Signed)
Attempted to call report

## 2013-10-26 NOTE — Consult Note (Signed)
Name: [name] MRN: [id number] DOB: 04/16/1945    ADMISSION DATE:  10/26/2013 CONSULTATION DATE:  10/26/2013  REFERRING MD :  EDP  CHIEF COMPLAINT:  Palpitations  BRIEF PATIENT DESCRIPTION:  68 yo female presented with 6 days of palpitations, dyspnea on exertion.  Found to have b/l PE with Rt heart strain on CT chest.  Has hx of PE in 2003.  PCCM asked to assess for thrombolytic therapy.  SIGNIFICANT STUDIES:  8/26 CTA >>> extensive b/l PE's with evidence of right heart strain c/w at least submassive PE. 8/26 Echo >>> EF 55%, grade 1 diastolic dysfx, mod/severe RV dilation with mod RV systolic dysfx, PAS 51 mmHg 8/27 LE Dopplers >>> 8/27 Hypercoag panel >>>   HISTORY OF PRESENT ILLNESS:  [name] is a 68 y.o. F with PMH as outlined below which includes a prior PE some in 2003.  She was on Coumadin following that episode for several months.  She presented to the ED on 8/26 with SOB that started 6 days prior.  She first noticed her symptoms while she was taking out the trash.  Since onset, SOB has progressed and is worse with exertion.  She has reached a point where she can not walk to the restroom without having to stop to catch her breath.  She denies any chest pain, only reports palpitations that have been similar to the ones she experienced with prior PE.  She denies any hemoptysis, no personal or family hx of cancer, no personal or family hx of clotting disorders, no recent lengthy travel, no calf/leg pain or swelling.  She does however report that she suffers from major depression and she often lays in her bed almost all day, only getting up to use the restroom or to eat.  In ED, she remained hemodynamically stable.  PCCM was consulted for recommendations.  PAST MEDICAL HISTORY :  Past Medical History  Diagnosis Date  . Depression     followed by Dr. Evelene Croon  . Diverticulosis of colon   . History of hepatitis B   . Hypertension   . Arthritis     osteoarthritis , kness.   [name] History of pulmonary embolism 01/2002  . Obesity   . Fibrocystic breast disease   . Obstructive sleep apnea     refuses CPAP.  [name] Hyperlipidemia   . Chronic kidney disease     Renal insuffucinecy, cr-1.33 in 10/2005.  [name] Left shoulder pain     2/2 fall  . Neck nodule     not erythematous, movable like cyst located at the base of posterior neck on the left side(not painful), will need follow up for   . Pulmonary nodule     rpt CT sowed resolution infectious vs inflammatory   Past Surgical History  Procedure Laterality Date  . Gyn surgery      s/p excison of vulvar cyst 10/18/04 by dr. Ilene QuaChristell Constant   Prior to Admission medications   Medication Sig Start Date End Date Taking? Authorizing Provider  ALPRAZolam [name]) 1 MG tablet Take 1 mg by mouth at bedtime as needed for sleep. Take 1.5 tab by mouth everynight.   Yes Historical Provider, MD  amLODipine (NORVASC) 5 MG tablet Take 5 mg by mouth daily.   Yes Historical Provider, MD  buPROPion (WELLBUTRIN SR) 200 MG 12 hr tablet Take 200 mg by mouth 3 (three) times daily. daily each morning and at noon 03/09/13  Yes [name], MD  Desvenlafaxine ER [facility])  50 MG TB24 Take 50 mg by mouth daily. Taking samples until she gets cymbalta   Yes Historical Provider, MD  hydrochlorothiazide (HYDRODIURIL) 25 MG tablet Take 1 tablet (25 mg total) by mouth daily. 07/11/13  Yes [name], MD  Multiple Vitamin (MULTIVITAMIN) tablet Take 1 tablet by mouth daily. 07/11/13  Yes [name], MD  omeprazole (PRILOSEC) 20 MG capsule Take 1 capsule (20 mg total) by mouth daily. 07/11/13 07/11/14 Yes [name], MD  potassium chloride (K-DUR) 10 MEQ tablet Take 1 tablet (10 mEq total) by mouth 2 (two) times daily. 07/11/13  Yes [name], MD  pravastatin (PRAVACHOL) 40 MG tablet Take 1 tablet (40 mg total) by mouth daily. 07/11/13  Yes [name], MD   Allergies  Allergen Reactions  . Lisinopril     REACTION: PT with  cough    FAMILY HISTORY:  Family History  Problem Relation Age of Onset  . Hypertension      family history   SOCIAL HISTORY:  reports that she quit smoking about 23 years ago. Her smoking use included Cigarettes. She smoked 0.00 packs per day. She does not have any smokeless tobacco history on file. Her alcohol and drug histories are not on file.  REVIEW OF SYSTEMS:   All negative; except for those that are bolded, which indicate positives.  Constitutional: weight loss, weight gain, night sweats, fevers, chills, fatigue, weakness.  HEENT: headaches, sore throat, sneezing, nasal congestion, post nasal drip, difficulty swallowing, tooth/dental problems, visual complaints, visual changes, ear aches. Neuro: difficulty with speech, weakness, numbness, ataxia. CV:  chest pain, orthopnea, PND, swelling in lower extremities, dizziness, palpitations, syncope.  Resp: cough, hemoptysis, dyspnea, wheezing. GI  heartburn, indigestion, abdominal pain, nausea, vomiting, diarrhea, constipation, change in bowel habits, loss of appetite, hematemesis, melena, hematochezia.  GU: dysuria, change in color of urine, urgency or frequency, flank pain, hematuria. MSK: joint pain or swelling, decreased range of motion. Psych: change in mood or affect, depression, anxiety, suicidal ideations, homicidal ideations. Skin: rash, itching, bruising.   SUBJECTIVE:  Reports SOB mainly with exertion.  Denies chest pain.  SpO2 96% on 1.5L O2  VITAL SIGNS: Temp:  [98.4 F (36.9 C)] 98.4 F (36.9 C) (08/26 1331) Pulse Rate:  [84-99] 84 (08/26 1804) Resp:  [15-24] 15 (08/26 1804) BP: (113-142)/(65-74) 113/65 mmHg (08/26 1804) SpO2:  [88 %-99 %] 97 % (08/26 1804) Weight:  [156.5 kg (345 lb 0.3 oz)] 156.5 kg (345 lb 0.3 oz) (08/26 1700)  PHYSICAL EXAMINATION: General: Pleasant female, morbidly obese, resting in bed, in NAD. Neuro: A&O x 3, non-focal.  HEENT: [place]/AT. PERRL, sclerae anicteric. Cardiovascular: Tachy  but regular, no M/R/G.  Lungs: Respirations even and unlabored.  CTA bilaterally, No W/R/R.  Abdomen: BS x 4, soft, NT/ND.  Musculoskeletal: No gross deformities, no edema.  Skin: Intact, warm, no rashes.  CBC Recent Labs     10/26/13  1339  WBC  7.3  HGB  15.1*  HCT  44.0  PLT  167    Coag's No results found for this basename: APTT, INR,  in the last 72 hours  BMET Recent Labs     10/26/13  1339  NA  140  K  4.1  CL  101  CO2  23  BUN  12  CREATININE  1.09  GLUCOSE  108*    Electrolytes Recent Labs     10/26/13  1339  CALCIUM  9.6    Sepsis Markers No results  found for this basename: LACTICACIDVEN, PROCALCITON, O2SATVEN,  in the last 72 hours  ABG No results found for this basename: PHART, PCO2ART, PO2ART,  in the last 72 hours  Liver Enzymes No results found for this basename: AST, ALT, ALKPHOS, BILITOT, ALBUMIN,  in the last 72 hours  Cardiac Enzymes Recent Labs     10/26/13  1339  PROBNP  1894.0*   Imaging Dg Chest 2 View  10/26/2013   CLINICAL DATA:  Difficulty breathing and chest tightness  EXAM: CHEST  2 VIEW  COMPARISON:  October 18, 2010  FINDINGS: There is no edema or consolidation. Heart is upper normal in size with pulmonary vascularity within normal limits. No adenopathy. There is degenerative change in the thoracic spine.  IMPRESSION: No edema or consolidation.   Electronically Signed   By: Bretta Bang M.D.   On: 10/26/2013 15:19   Ct Angio Chest Pe W/cm &/or Wo Cm  10/26/2013   CLINICAL DATA:  Shortness of breath.  EXAM: CT ANGIOGRAPHY CHEST WITH CONTRAST  TECHNIQUE: Multidetector CT imaging of the chest was performed using the standard protocol during bolus administration of intravenous contrast. Multiplanar CT image reconstructions and MIPs were obtained to evaluate the vascular anatomy.  CONTRAST:  100 mL OMNIPAQUE IOHEXOL 350 MG/ML SOLN  COMPARISON:  CT chest 04/24/2008. PA and lateral chest earlier this same day.  FINDINGS: Bilateral  pulmonary emboli are identified. A large embolus is seen the right main pulmonary artery. Saddle embolus at the bifurcation of the left main pulmonary artery is also seen with extensive clot in the descending interlobar arteries bilaterally and their branches. Clot in upper lobe arterial branches is also seen, better on the left. The RV to LV ratio is 1.41 consistent with right heart strain.  There is no pleural or pericardial effusion. Heart size is upper normal. No axillary, hilar or mediastinal lymphadenopathy is identified. The lungs demonstrate mild dependent atelectasis. No evidence of pulmonary infarct is seen. Previously seen 0.5 cm left lower lobe pulmonary nodule is somewhat difficult to visualize on this exam but is thought to be on image 60 and stable in size.  Incidentally imaged upper abdomen shows partial visualization of a large left renal cyst. Imaged intra-abdominal contents are otherwise unremarkable. Thoracic spondylosis is noted. No lytic or sclerotic bony lesion is identified.  Review of the MIP images confirms the above findings.  IMPRESSION: The study is positive for extensive bilateral pulmonary embolus with evidence of right heart strain consistent with at least submassive PE. The presence of right heart strain has been associated with increased risk of morbidity and mortality. Consultation with Pulmonary and Critical Care Medicine is recommended.  0.5 cm left lower lobe pulmonary nodule is stable in size consistent with benignity.  Critical Value/emergent results were called by telephone at the time of interpretation on 10/26/2013 at 5:50 pm to [facility] who verbally acknowledged these results.   Electronically Signed   By: Drusilla Kanner M.D.   On: 10/26/2013 17:52       Impression: 68 yo female with recurrent PE - initial event in 2003.  She has evidence for Rt heart strain on CT chest and Echo.  She has hx of severe OSA, and has not been able to tolerate CPAP.  Some  of her Rt heart pressure elevation could be related to untreated OSA.  Acute pulmonary embolism with concern for Rt heart strain. Plan: Continue heparin gtt for now F/u troponin F/u hypercoagulable panel F/u doppler legs No indication  for systemic thrombolytic therapy Could consider catheter directed thrombolytic therapy >> discussed at length with pt, and she would like more information from radiologist prior to having this done >> IR can be consulted to discuss in AM She will likely need long term/indefinite anticoagulation given recurrence of what appears to be unprovoked PE with periodic re-assessment of bleeding risk  Hx of severe obstructive sleep apnea. Plan: Will need to have further discussion with pt about treatment options for sleep apnea  Morbid obesity >> she report losing 60 lbs since 2008. Plan: Continue wt lose efforts  Depression. Plan: Per primary team  Rutherford Guys, PA - [facility] [place] Pgr: [phone]  or [phone]  Reviewed above, examined, and documentation changes made as needed.  68 yo female with recurrent, seemingly unprovoked PE.  She has evidence for elevated Rt heart pressures >> some of this is chronic related to untreated sleep apnea, but much of this is related to acute PE.  She is otherwise hemodynamically stable and not having much difficulty with oxygenation.  She could be consider for catheter directed thrombolytic therapy >> discussed in detail with pt, and she would like to discuss further with IR before considering this option.  Also discussed with her different options for oral anticoagulation (warfarin vs newer oral anticoagulants), and discussed that she would need to consider indefinite anti-coagulation given recurrence of apparently unprovoked PE.  At this time her risk of bleeding while on oral anti-coagulants appears low, but this will need to be periodically re-assessed while she is on  anti-coagulation therapy.  In this situation warfarin might be better option for her since warfarin therapy has much more information about long term use compared to newer agents.  Coralyn Helling, MD [facility] Pulmonary/Critical Care 10/26/2013, 11:19 PM Pager:  [phone] After 3pm call: [phone]

## 2013-10-26 NOTE — H&P (Signed)
Date: 10/26/2013               Patient Name:  [name] MRN: [id number]  DOB: 09-10-1945 Age / Sex: 68 y.o., female   PCP: [name], MD         Medical Service: Internal Medicine Teaching Service         Attending Physician: Dr. [name], MD    First Contact: Dr. Allena Katz Pager: [phone]  Second Contact: Dr. Garald Braver Pager: [phone]       After Hours (After 5p/  First Contact Pager: [phone]  weekends / holidays): Second Contact Pager: [phone]   Chief Complaint: SOB  History of Present Illness: Ms. Harnish is a 68 year old woman with history of obesity, CKD, OSA, HTN, depression, previous PE in 2003 (treated with coumadin x 6 months) presenting with progressive shortness of breath x 6 days. She first was short of breathing Friday taking out her trash and has progressed to only being able to walk to the bathroom before getting short of breath. She does not recall having shortness of breath prior to this. No SOB at rest. Reports palpitations. Denies cough, hemoptysis, orthopnea, PND, chest pain. This is similar to her previous episode of PE. She has been less active for the last month - mostly bed bound 2/2 her depression. Reports medication compliance. No recent travel. Denies family history of bleeding/clotting problems. Denies medications with estrogens.  Denies fevers, chills, nausea, vomiting, abdominal pain, diarrhea, dysuria, hematuria, lightheadedness, dizziness, edema, paresthesias, arthralgias, myalagias.  Meds: Current Facility-Administered Medications  Medication Dose Route Frequency Provider Last Rate Last Dose  . heparin ADULT infusion 100 units/mL (25000 units/250 mL)  1,900 Units/hr Intravenous Continuous Fredrik Rigger, [facility] 19 mL/hr at 10/26/13 1817 1,900 Units/hr at 10/26/13 1817   Current Outpatient Prescriptions  Medication Sig Dispense Refill  . ALPRAZolam (XANAX) 1 MG tablet Take 1 mg by mouth at bedtime as needed for sleep. Take  1.5 tab by mouth everynight.      [name] amLODipine (NORVASC) 5 MG tablet Take 5 mg by mouth daily.      [name] buPROPion (WELLBUTRIN SR) 200 MG 12 hr tablet Take 200 mg by mouth 3 (three) times daily. daily each morning and at noon      . Desvenlafaxine ER (KHEDEZLA) 50 MG TB24 Take 50 mg by mouth daily. Taking samples until she gets cymbalta      . hydrochlorothiazide (HYDRODIURIL) 25 MG tablet Take 1 tablet (25 mg total) by mouth daily.  90 tablet  4  . Multiple Vitamin (MULTIVITAMIN) tablet Take 1 tablet by mouth daily.  30 tablet  3  . omeprazole (PRILOSEC) 20 MG capsule Take 1 capsule (20 mg total) by mouth daily.  90 capsule  1  . potassium chloride (K-DUR) 10 MEQ tablet Take 1 tablet (10 mEq total) by mouth 2 (two) times daily.  180 tablet  4  . pravastatin (PRAVACHOL) 40 MG tablet Take 1 tablet (40 mg total) by mouth daily.  90 tablet  4  . [DISCONTINUED] oxybutynin (DITROPAN) 5 MG tablet Take 1 tablet (5 mg total) by mouth 2 (two) times daily.  180 tablet  2    Allergies: Allergies as of 10/26/2013 - Review Complete 10/26/2013  Allergen Reaction Noted  . Lisinopril  09/24/2006   Past Medical History  Diagnosis Date  . Depression     followed by Dr. Evelene Croon  . Diverticulosis of colon   . History of hepatitis B   .  Hypertension   . Arthritis     osteoarthritis , kness.  [name] History of pulmonary embolism 01/2002  . Obesity   . Fibrocystic breast disease   . Obstructive sleep apnea     refuses CPAP.  [name] Hyperlipidemia   . Chronic kidney disease     Renal insuffucinecy, cr-1.33 in 10/2005.  [name] Left shoulder pain     2/2 fall  . Neck nodule     not erythematous, movable like cyst located at the base of posterior neck on the left side(not painful), will need follow up for   . Pulmonary nodule     rpt CT sowed resolution infectious vs inflammatory   Past Surgical History  Procedure Laterality Date  . Gyn surgery      s/p excison of vulvar cyst 10/18/04 by dr. Ilene QuaChristell Constant    Family History  Problem Relation Age of Onset  . Hypertension      family history   History   Social History  . Marital Status: Divorced    Spouse Name: N/A    Number of Children: N/A  . Years of Education: N/A   Occupational History  . Not on file.   Social History Main Topics  . Smoking status: Former Smoker    Types: Cigarettes    Quit date: 03/03/1990  . Smokeless tobacco: Not on file  . Alcohol Use: Not on file  . Drug Use: Not on file  . Sexual Activity: Not on file   Other Topics Concern  . Not on file   Social History Narrative  . No narrative on file  Tobacco: Previously 1 ppd for 20 years Alcohol: none since 3-4 years ago Illicits: denies   Review of Systems: Constitutional: no fevers/chills Eyes: no vision changes Ears, nose, mouth, throat, and face: no cough Respiratory: +exertional shortness of breath Cardiovascular: no chest pain, +palpitations Gastrointestinal: no nausea/vomiting, no abdominal pain, no constipation, no diarrhea Genitourinary: no dysuria, no hematuria Integument: no rash Hematologic/lymphatic: no bleeding/bruising, no edema Musculoskeletal: no arthralgias, no myalgias Neurological: no paresthesias, no weakness   Physical Exam: Blood pressure 113/65, pulse 84, temperature 98.4 F (36.9 C), temperature source Oral, resp. rate 15, height 5' 6.93" (1.7 m), weight 345 lb 0.3 oz (156.5 kg), SpO2 97.00%. General Apperance: NAD Head: Normocephalic, atraumatic Eyes: PERRL, EOMI, anicteric sclera Ears: Nares normal, septum midline, mucosa normal Throat: Lips, mucosa and tongue normal  Neck: Supple, trachea midline Back: No tenderness or bony abnormality  Lungs: Clear to auscultation bilaterally. No wheezes, rhonchi or rales. Breathing comfortably Chest Wall: Nontender, no deformity Heart: Regular rate and rhythm, no murmur/rub/gallop Abdomen: Soft, nontender, nondistended, no rebound/guarding Extremities: Normal, atraumatic, warm  and well perfused, no edema Pulses: 2+ throughout Skin: No rashes or lesions Neurologic: Alert and oriented x 3. CNII-XII intact. Normal strength and sensation  Lab results: Basic Metabolic Panel:  Recent Labs  40/98/11 1339  NA 140  K 4.1  CL 101  CO2 23  GLUCOSE 108*  BUN 12  CREATININE 1.09  CALCIUM 9.6   CBC:  Recent Labs  10/26/13 1339  WBC 7.3  HGB 15.1*  HCT 44.0  MCV 78.3  PLT 167   Cardiac Enzymes:  Recent Labs  10/26/13 2304  TROPONINI <0.30   BNP:  Recent Labs  10/26/13 1339  PROBNP 1894.0*    Imaging results:  Dg Chest 2 View  10/26/2013   CLINICAL DATA:  Difficulty breathing and chest tightness  EXAM: CHEST  2 VIEW  COMPARISON:  October 18, 2010  FINDINGS: There is no edema or consolidation. Heart is upper normal in size with pulmonary vascularity within normal limits. No adenopathy. There is degenerative change in the thoracic spine.  IMPRESSION: No edema or consolidation.   Electronically Signed   By: Bretta Bang M.D.   On: 10/26/2013 15:19   Ct Angio Chest Pe W/cm &/or Wo Cm  10/26/2013   CLINICAL DATA:  Shortness of breath.  EXAM: CT ANGIOGRAPHY CHEST WITH CONTRAST  TECHNIQUE: Multidetector CT imaging of the chest was performed using the standard protocol during bolus administration of intravenous contrast. Multiplanar CT image reconstructions and MIPs were obtained to evaluate the vascular anatomy.  CONTRAST:  100 mL OMNIPAQUE IOHEXOL 350 MG/ML SOLN  COMPARISON:  CT chest 04/24/2008. PA and lateral chest earlier this same day.  FINDINGS: Bilateral pulmonary emboli are identified. A large embolus is seen the right main pulmonary artery. Saddle embolus at the bifurcation of the left main pulmonary artery is also seen with extensive clot in the descending interlobar arteries bilaterally and their branches. Clot in upper lobe arterial branches is also seen, better on the left. The RV to LV ratio is 1.41 consistent with right heart strain.  There  is no pleural or pericardial effusion. Heart size is upper normal. No axillary, hilar or mediastinal lymphadenopathy is identified. The lungs demonstrate mild dependent atelectasis. No evidence of pulmonary infarct is seen. Previously seen 0.5 cm left lower lobe pulmonary nodule is somewhat difficult to visualize on this exam but is thought to be on image 60 and stable in size.  Incidentally imaged upper abdomen shows partial visualization of a large left renal cyst. Imaged intra-abdominal contents are otherwise unremarkable. Thoracic spondylosis is noted. No lytic or sclerotic bony lesion is identified.  Review of the MIP images confirms the above findings.  IMPRESSION: The study is positive for extensive bilateral pulmonary embolus with evidence of right heart strain consistent with at least submassive PE. The presence of right heart strain has been associated with increased risk of morbidity and mortality. Consultation with Pulmonary and Critical Care Medicine is recommended.  0.5 cm left lower lobe pulmonary nodule is stable in size consistent with benignity.  Critical Value/emergent results were called by telephone at the time of interpretation on 10/26/2013 at 5:50 pm to [facility] who verbally acknowledged these results.   Electronically Signed   By: Drusilla Kanner M.D.   On: 10/26/2013 17:52    Other results: EKG: sinus tachycardia, nonspecific t wave inversions, no previous EKG for comparison  Assessment & Plan by Problem: Active Problems:   OBSTRUCTIVE SLEEP APNEA   HYPERTENSION   CKD (chronic kidney disease), stage II   Prediabetes   Pulmonary emboli   Acute pulmonary embolism  Dyspnea on exertion: history of PE treated with coumadin x 6 months. CTA chest with extensive bilateral PE concerning for right heart strain. Pt was mildly tachycardic to 99 in the ED, normotensive, no shock. Initial troponin negative. BNP elevated to 1894. She was started on therapeutic heparin in the ED  and PCCM consulted. -titrate heparin gtt to goal -trend troponin, repeat EKG in AM -f/u hypercoagulable panel: lupus anticoagulant panel, B2glycoprotein abs, homocysteine, factor 5 leiden, prothrombin gene mutation, cardiolipin antibody -LE duplex -Echo -consider catheter directed thrombolytic therapy -Hgb A1c, TSH  Depression: pt has been less active for the past month 2/2 depression. Denies SI/HI. -consider psych consult in AM -xanax  QHS prn -Wellbutrin SR  TID  OSA: has  not tolerated CPAP in the past -continue to monitor  HTN: normotensive here -hold home amlodipine  daily -hold home HCTZ  daily  GERD: -continue home PPI daily  HLD: -continue statin daily  FEN -heart healthy/carb modified  DVT ppx: heparin gtt  Dispo: Disposition is deferred at this time, awaiting improvement of current medical problems. Anticipated discharge in approximately 1 day(s).   The patient does have a current PCP [name], MD) and does need an [facility] hospital follow-up appointment after discharge.  The patient does not know have transportation limitations that hinder transportation to clinic appointments.  Signed: Griffin Basil, MD 10/26/2013, 7:24 PM

## 2013-10-26 NOTE — ED Notes (Signed)
Radiology called and asked to bring pt to room A4 when complete.

## 2013-10-26 NOTE — Progress Notes (Addendum)
Echocardiogram 2D Echocardiogram STAT exam has been performed.  Dorothey Baseman 10/26/2013, 8:11 PM

## 2013-10-27 ENCOUNTER — Encounter (HOSPITAL_COMMUNITY): Payer: Self-pay | Admitting: *Deleted

## 2013-10-27 ENCOUNTER — Inpatient Hospital Stay (HOSPITAL_COMMUNITY): Payer: Medicare HMO

## 2013-10-27 DIAGNOSIS — I2699 Other pulmonary embolism without acute cor pulmonale: Secondary | ICD-10-CM

## 2013-10-27 DIAGNOSIS — G4733 Obstructive sleep apnea (adult) (pediatric): Secondary | ICD-10-CM

## 2013-10-27 LAB — HEPARIN LEVEL (UNFRACTIONATED)
HEPARIN UNFRACTIONATED: 0.6 [IU]/mL (ref 0.30–0.70)
Heparin Unfractionated: 0.88 IU/mL — ABNORMAL HIGH (ref 0.30–0.70)
Heparin Unfractionated: 0.82 IU/mL — ABNORMAL HIGH (ref 0.30–0.70)

## 2013-10-27 LAB — CBC
HCT: 30.7 % — ABNORMAL LOW (ref 36.0–46.0)
HEMATOCRIT: 43.1 % (ref 36.0–46.0)
HEMOGLOBIN: 14.2 g/dL (ref 12.0–15.0)
Hemoglobin: 10.2 g/dL — ABNORMAL LOW (ref 12.0–15.0)
MCH: 26.4 pg (ref 26.0–34.0)
MCH: 27.1 pg (ref 26.0–34.0)
MCHC: 32.9 g/dL (ref 30.0–36.0)
MCHC: 33.2 g/dL (ref 30.0–36.0)
MCV: 80.3 fL (ref 78.0–100.0)
MCV: 81.4 fL (ref 78.0–100.0)
Platelets: 181 10*3/uL (ref 150–400)
RBC: 5.37 MIL/uL — ABNORMAL HIGH (ref 3.87–5.11)
RBC: 3.77 MIL/uL — ABNORMAL LOW (ref 3.87–5.11)
RDW: 14.5 % (ref 11.5–15.5)
RDW: 14.5 % (ref 11.5–15.5)
WBC: 8.2 10*3/uL (ref 4.0–10.5)
WBC: 5.3 10*3/uL (ref 4.0–10.5)

## 2013-10-27 LAB — HOMOCYSTEINE: Homocysteine: 12.1 umol/L (ref 4.0–15.4)

## 2013-10-27 LAB — URINALYSIS, ROUTINE W REFLEX MICROSCOPIC
GLUCOSE, UA: NEGATIVE mg/dL
HGB URINE DIPSTICK: NEGATIVE
KETONES UR: NEGATIVE mg/dL
Nitrite: NEGATIVE
Protein, ur: NEGATIVE mg/dL
Specific Gravity, Urine: 1.02 (ref 1.005–1.030)
Urobilinogen, UA: 1 mg/dL (ref 0.0–1.0)
pH: 5.5 (ref 5.0–8.0)

## 2013-10-27 LAB — CARDIOLIPIN ANTIBODIES, IGG, IGM, IGA
Anticardiolipin IgA: 14 APL U/mL (ref <22)
Anticardiolipin IgG: 7 GPL U/mL — ABNORMAL LOW (ref <23)
Anticardiolipin IgM: 48 MPL U/mL — ABNORMAL HIGH (ref <11)

## 2013-10-27 LAB — COMPREHENSIVE METABOLIC PANEL
ALK PHOS: 90 U/L (ref 39–117)
ALT: 14 U/L (ref 0–35)
ANION GAP: 17 — AB (ref 5–15)
AST: 19 U/L (ref 0–37)
Albumin: 3.4 g/dL — ABNORMAL LOW (ref 3.5–5.2)
BILIRUBIN TOTAL: 0.4 mg/dL (ref 0.3–1.2)
BUN: 11 mg/dL (ref 6–23)
CHLORIDE: 100 meq/L (ref 96–112)
CO2: 24 mEq/L (ref 19–32)
Calcium: 8.9 mg/dL (ref 8.4–10.5)
Creatinine, Ser: 0.92 mg/dL (ref 0.50–1.10)
GFR calc non Af Amer: 63 mL/min — ABNORMAL LOW (ref >90)
GFR, EST AFRICAN AMERICAN: 74 mL/min — AB (ref >90)
Glucose, Bld: 102 mg/dL — ABNORMAL HIGH (ref 70–99)
POTASSIUM: 3.2 meq/L — AB (ref 3.7–5.3)
Sodium: 141 mEq/L (ref 137–147)
Total Protein: 7.2 g/dL (ref 6.0–8.3)

## 2013-10-27 LAB — BETA-2-GLYCOPROTEIN I ABS, IGG/M/A
Beta-2 Glyco I IgG: 5 G Units (ref <20)
Beta-2-Glycoprotein I IgA: 7 A Units (ref <20)
Beta-2-Glycoprotein I IgM: 23 M Units — ABNORMAL HIGH (ref <20)

## 2013-10-27 LAB — LUPUS ANTICOAGULANT PANEL
DRVVT: 32.4 s (ref <42.9)
Lupus Anticoagulant: NOT DETECTED
PTT LA: 148.5 s — AB (ref 28.0–43.0)
PTTLA 4:1 Mix: 114.4 secs — ABNORMAL HIGH (ref 28.0–43.0)
PTTLA Confirmation: 1.2 secs (ref <8.0)

## 2013-10-27 LAB — URINE MICROSCOPIC-ADD ON

## 2013-10-27 LAB — GLUCOSE, CAPILLARY: GLUCOSE-CAPILLARY: 106 mg/dL — AB (ref 70–99)

## 2013-10-27 LAB — HEMOGLOBIN A1C
Hgb A1c MFr Bld: 6.2 % — ABNORMAL HIGH (ref <5.7)
Mean Plasma Glucose: 131 mg/dL — ABNORMAL HIGH (ref <117)

## 2013-10-27 LAB — FIBRINOGEN
FIBRINOGEN: 413 mg/dL (ref 204–475)
Fibrinogen: 403 mg/dL (ref 204–475)

## 2013-10-27 LAB — TROPONIN I: Troponin I: <0.3 ng/mL (ref <0.30)

## 2013-10-27 LAB — TSH: TSH: 4.01 u[IU]/mL (ref 0.350–4.500)

## 2013-10-27 MED ORDER — POTASSIUM CHLORIDE 10 MEQ/100ML IV SOLN: 10.0000 meq | INTRAVENOUS | Status: DC

## 2013-10-27 MED ORDER — SODIUM CHLORIDE 0.9 % IJ SOLN
3.0000 mL | Freq: Two times a day (BID) | INTRAMUSCULAR | Status: DC
  Administered 2013-10-27 – 2013-10-28 (×2): 3 mL via INTRAVENOUS

## 2013-10-27 MED ORDER — SODIUM CHLORIDE 0.9 % IV SOLN
12.0000 mg | Freq: Once | INTRAVENOUS | Status: DC
  Filled 2013-10-27: qty 12

## 2013-10-27 MED ORDER — SODIUM CHLORIDE 0.9 % IV SOLN
INTRAVENOUS | Status: DC
  Administered 2013-10-28: 03:00:00 via INTRAVENOUS

## 2013-10-27 MED ORDER — SODIUM CHLORIDE 0.9 % IV SOLN: INTRAVENOUS | Status: DC

## 2013-10-27 MED ORDER — CETYLPYRIDINIUM CHLORIDE 0.05 % MT LIQD
7.0000 mL | Freq: Two times a day (BID) | OROMUCOSAL | Status: DC
  Administered 2013-10-27 – 2013-10-30 (×5): 7 mL via OROMUCOSAL

## 2013-10-27 MED ORDER — POTASSIUM CHLORIDE CRYS ER 20 MEQ PO TBCR
40.0000 meq | EXTENDED_RELEASE_TABLET | Freq: Once | ORAL | Status: AC
  Administered 2013-10-27: 40 meq via ORAL
  Filled 2013-10-27: qty 2

## 2013-10-27 MED ORDER — FENTANYL CITRATE 0.05 MG/ML IJ SOLN
INTRAMUSCULAR | Status: AC
  Filled 2013-10-27: qty 4

## 2013-10-27 MED ORDER — DULOXETINE HCL 60 MG PO CPEP
60.0000 mg | ORAL_CAPSULE | Freq: Every day | ORAL | Status: DC
  Administered 2013-10-27 – 2013-10-31 (×5): 60 mg via ORAL
  Filled 2013-10-27 (×5): qty 1

## 2013-10-27 MED ORDER — POTASSIUM CHLORIDE 10 MEQ/100ML IV SOLN
10.0000 meq | INTRAVENOUS | Status: AC
  Administered 2013-10-27: 10 meq via INTRAVENOUS
  Filled 2013-10-27: qty 100

## 2013-10-27 MED ORDER — CETYLPYRIDINIUM CHLORIDE 0.05 % MT LIQD: 7.0000 mL | Freq: Two times a day (BID) | OROMUCOSAL | Status: DC

## 2013-10-27 MED ORDER — LIDOCAINE HCL 1 % IJ SOLN
INTRAMUSCULAR | Status: AC
  Filled 2013-10-27: qty 20

## 2013-10-27 MED ORDER — BUPROPION HCL 100 MG PO TABS
100.0000 mg | ORAL_TABLET | Freq: Three times a day (TID) | ORAL | Status: DC
  Administered 2013-10-27 – 2013-10-31 (×12): 100 mg via ORAL
  Filled 2013-10-27 (×14): qty 1

## 2013-10-27 MED ORDER — SODIUM CHLORIDE 0.9 % IV SOLN: 250.0000 mL | INTRAVENOUS | Status: DC | PRN

## 2013-10-27 MED ORDER — FENTANYL CITRATE 0.05 MG/ML IJ SOLN
INTRAMUSCULAR | Status: AC | PRN
  Administered 2013-10-27 (×2): 50 ug via INTRAVENOUS

## 2013-10-27 MED ORDER — IOHEXOL 300 MG/ML  SOLN
50.0000 mL | Freq: Once | INTRAMUSCULAR | Status: AC | PRN
  Administered 2013-10-27: 15 mL via INTRAVENOUS

## 2013-10-27 MED ORDER — SODIUM CHLORIDE 0.9 % IJ SOLN: 3.0000 mL | INTRAMUSCULAR | Status: DC | PRN

## 2013-10-27 NOTE — Care Management Note (Addendum)
    Page 1 of 2   10/31/2013     5:39:45 PM CARE MANAGEMENT NOTE 10/31/2013  Patient:  [name], [name]   Account Number:  [id number]  Date Initiated:  10/27/2013  Documentation initiated by:  Junius Creamer  Subjective/Objective Assessment:   adm w pul emboli     Action/Plan:   lives alone, pcp dr Philis Nettle butcher   Anticipated DC Date:  10/31/2013   Anticipated DC Plan:  HOME W HOME HEALTH SERVICES      DC Planning Services  CM consult  Medication Assistance      [facility] Choice  HOME HEALTH   Choice offered to / List presented to:  C-1 Patient        HH arranged  HH-1 RN      [facility] agency  [facility]   Status of service:  Completed, signed off Medicare Important Message given?  YES (If response is "NO", the following Medicare IM given date fields will be blank) Date Medicare IM given:  10/31/2013 Medicare IM given by:  Daemien Fronczak Date Additional Medicare IM given:   Additional Medicare IM given by:    Discharge Disposition:  HOME W HOME HEALTH SERVICES  Per UR Regulation:  Reviewed for med. necessity/level of care/duration of stay  If discussed at Long Length of Stay Meetings, dates discussed:    Comments:  10/31/13 Sidney Ace, RN, BSN [phone] Pt for dc home today.  PT recommending no PT follow up at home.  MD ordered [facility] at dc; referral to [facility], per pt choice.  Start of care 24-48h post dc date.  No dme needs, per pt.  8/28 1125 debbie dowell rn,bsn spoke w pt. she lives alone w her dog. she states has been very sedentary for last month or so. she would like to get more active. she also has home cpap but has not been wearing. she plans to try and wear this when she gets home. she would like to have [facility] ck on her after disch. have left md sticky note about orders if he agrees. gave pt 30day free xarelto card in case she goes on this drug.

## 2013-10-27 NOTE — Procedures (Signed)
Interventional Radiology Procedure Note  Procedure: Bilateral ultrasound accelerated thrombolysis (USAT) for submassive PE. Main PA pressure 70/37 (49) mmHg  Access: Right common femoral vein: 2x 89F sheaths Complications:  None immediate Recommendations: - Lyse per USAT protocol - Return to IR in am for pressure check and catheter removal - Clear liquid diet - Bedrest - Q 6 hr labs per protocol  Signed,  Sterling Big, MD [facility]

## 2013-10-27 NOTE — ED Provider Notes (Signed)
Medical screening examination/treatment/procedure(s) were conducted as a shared visit with non-physician practitioner(s) and myself.  I personally evaluated the patient during the encounter.   EKG Interpretation None      Patient here with SOB, similar to prior PE. PE see on today's scan. Admitted.   Elwin Mocha, MD 10/27/13 [phone]

## 2013-10-27 NOTE — Progress Notes (Signed)
ANTICOAGULATION CONSULT NOTE - Follow Up Consult  Pharmacy Consult for heparin Indication: PE with bilateral ultrasound accelerated thrombolysis    Allergies  Allergen Reactions  . Lisinopril     REACTION: PT with cough    Patient Measurements: Height: 5' 7.5" (171.5 cm) Weight: 330 lb 0.5 oz (149.7 kg) IBW/kg (Calculated) : 62.75 Heparin Dosing Weight: 100.7kg  Vital Signs: Temp: 98.3 F (36.8 C) (08/27 1540) Temp src: Oral (08/27 1540) BP: 149/82 mmHg (08/27 1540) Pulse Rate: 87 (08/27 1540)  Labs:  Recent Labs  10/26/13 1339 10/26/13 2304 10/27/13 0038 10/27/13 0350  HGB 15.1*  --   --  14.2  HCT 44.0  --   --  43.1  PLT 167  --   --  181  HEPARINUNFRC  --   --  0.88* 0.82*  CREATININE 1.09  --   --  0.92  TROPONINI  --  <0.30  --  <0.30    Estimated Creatinine Clearance: 92.7 ml/min (by C-G formula based on Cr of 0.92).   Medications:  Infusions:  . sodium chloride 10 mL/hr at 10/26/13 2100  . heparin 1,650 Units/hr (10/27/13 0900)    Assessment: 68 yo female with bilateral PE and for bilateral ultrasound accelerated thrombolysis on 12 hours of alteplase. Heparin to continue with alteplase. Last heparin level was 0.82 and heparin was reduced to 1650 units/hr.  Goal of Therapy:  Heparin level 0.3-0.7 units/ml Monitor platelets by anticoagulation protocol: Yes   Plan: -Continue heparin at 1650 units/hr -Heparin level in 6 hours -Heparin level and CBC daily  Harland German, Pharm D 10/27/2013 4:22 PM

## 2013-10-27 NOTE — H&P (Signed)
[name] is an 68 y.o. female.   Chief Complaint: Pt has had 6 day hx shortness of breath with exertion Feeling dizzy She has hx of previous PE 2003 treated with Inpt Hep then coumadin by mouth Presented to ED today Work up reveals + CTA for B pulmonary embolus with Rt heart strain + Echo for Rt heart strain Neg B LE doppler studies EKG without LBBB Has been seen by  PCCM- Dr Lake Bells has requested consult for B PE thrombolysis in IR Dr Geroge Baseman has reviewed imaging and chart He has seen and evaluated pt I have seen and examined pt Now scheduled for Pulmonary angiogram with [place] assisted thrombolysis  Denies recent surgery; previous CVA or TIA; or fall risk  HPI: depression; diverticulosis; Hep B; Hx PE; OSA; HLD  Past Medical History  Diagnosis Date  . Depression     followed by Dr. Toy Care  . Diverticulosis of colon   . History of hepatitis B   . Hypertension   . Arthritis     osteoarthritis , kness.  [name] History of pulmonary embolism 01/2002  . Obesity   . Fibrocystic breast disease   . Obstructive sleep apnea     refuses CPAP.  [name] Hyperlipidemia   . Chronic kidney disease     Renal insuffucinecy, cr-1.33 in 10/2005.  [name] Left shoulder pain     2/2 fall  . Neck nodule     not erythematous, movable like cyst located at the base of posterior neck on the left side(not painful), will need follow up for   . Pulmonary nodule     rpt CT sowed resolution infectious vs inflammatory    Past Surgical History  Procedure Laterality Date  . Gyn surgery      s/p excison of vulvar cyst 10/18/04 by dr. Chrissie NoaLaurance Flatten    Family History  Problem Relation Age of Onset  . Hypertension      family history   Social History:  reports that she quit smoking about 23 years ago. Her smoking use included Cigarettes. She smoked 0.00 packs per day. She does not have any smokeless tobacco history on file. Her alcohol and drug histories are not on file.  Allergies:  Allergies   Allergen Reactions  . Lisinopril     REACTION: PT with cough    Medications Prior to Admission  Medication Sig Dispense Refill  . ALPRAZolam (XANAX) 1 MG tablet Take 1 mg by mouth at bedtime as needed for sleep. Take 1.5 tab by mouth everynight.      [name] amLODipine (NORVASC) 5 MG tablet Take 5 mg by mouth daily.      [name] buPROPion (WELLBUTRIN) 100 MG tablet Take 100 mg by mouth 3 (three) times daily.      . DULoxetine (CYMBALTA) 60 MG capsule Take 60 mg by mouth daily.      . hydrochlorothiazide (HYDRODIURIL) 25 MG tablet Take 1 tablet (25 mg total) by mouth daily.  90 tablet  4  . Multiple Vitamin (MULTIVITAMIN) tablet Take 1 tablet by mouth daily.  30 tablet  3  . omeprazole (PRILOSEC) 20 MG capsule Take 1 capsule (20 mg total) by mouth daily.  90 capsule  1  . potassium chloride (K-DUR) 10 MEQ tablet Take 1 tablet (10 mEq total) by mouth 2 (two) times daily.  180 tablet  4  . pravastatin (PRAVACHOL) 40 MG tablet Take 1 tablet (40 mg total) by mouth daily.  90 tablet  4  Results for orders placed during the hospital encounter of 10/26/13 (from the past 48 hour(s))  CBC     Status: Abnormal   Collection Time    10/26/13  1:39 PM      Result Value Ref Range   WBC 7.3  4.0 - 10.5 K/uL   RBC 5.62 (*) 3.87 - 5.11 MIL/uL   Hemoglobin 15.1 (*) 12.0 - 15.0 g/dL   HCT 44.0  36.0 - 46.0 %   MCV 78.3  78.0 - 100.0 fL   MCH 26.9  26.0 - 34.0 pg   MCHC 34.3  30.0 - 36.0 g/dL   RDW 14.3  11.5 - 15.5 %   Platelets 167  150 - 400 K/uL  BASIC METABOLIC PANEL     Status: Abnormal   Collection Time    10/26/13  1:39 PM      Result Value Ref Range   Sodium 140  137 - 147 mEq/L   Potassium 4.1  3.7 - 5.3 mEq/L   Chloride 101  96 - 112 mEq/L   CO2 23  19 - 32 mEq/L   Glucose, Bld 108 (*) 70 - 99 mg/dL   BUN 12  6 - 23 mg/dL   Creatinine, Ser 1.09  0.50 - 1.10 mg/dL   Calcium 9.6  8.4 - 10.5 mg/dL   GFR calc non Af Amer 52 (*) >90 mL/min   GFR calc Af Amer 60 (*) >90 mL/min   Comment: (NOTE)      The eGFR has been calculated using the CKD EPI equation.     This calculation has not been validated in all clinical situations.     eGFR's persistently <90 mL/min signify possible Chronic Kidney     Disease.   Anion gap 16 (*) 5 - 15  PRO B NATRIURETIC PEPTIDE     Status: Abnormal   Collection Time    10/26/13  1:39 PM      Result Value Ref Range   Pro B Natriuretic peptide (BNP) 1894.0 (*) 0 - 125 pg/mL  I-STAT TROPOININ, ED     Status: None   Collection Time    10/26/13  2:36 PM      Result Value Ref Range   Troponin i, poc 0.01  0.00 - 0.08 ng/mL   Comment 3            Comment: Due to the release kinetics of cTnI,     a negative result within the first hours     of the onset of symptoms does not rule out     myocardial infarction with certainty.     If myocardial infarction is still suspected,     repeat the test at appropriate intervals.  HOMOCYSTEINE     Status: None   Collection Time    10/26/13  6:59 PM      Result Value Ref Range   Homocysteine 12.1  4.0 - 15.4 umol/L   Comment: Performed at [facility] PCR SCREENING     Status: None   Collection Time    10/26/13  8:18 PM      Result Value Ref Range   MRSA by PCR NEGATIVE  NEGATIVE   Comment:            The GeneXpert MRSA Assay (FDA     approved for NASAL specimens     only), is one component of a     comprehensive MRSA colonization     surveillance  program. It is not     intended to diagnose MRSA     infection nor to guide or     monitor treatment for     MRSA infections.  TROPONIN I     Status: None   Collection Time    10/26/13 11:04 PM      Result Value Ref Range   Troponin I <0.30  <0.30 ng/mL   Comment:            Due to the release kinetics of cTnI,     a negative result within the first hours     of the onset of symptoms does not rule out     myocardial infarction with certainty.     If myocardial infarction is still suspected,     repeat the test at appropriate intervals.   HEPARIN LEVEL (UNFRACTIONATED)     Status: Abnormal   Collection Time    10/27/13 12:38 AM      Result Value Ref Range   Heparin Unfractionated 0.88 (*) 0.30 - 0.70 IU/mL   Comment:            IF HEPARIN RESULTS ARE BELOW     EXPECTED VALUES, AND PATIENT     DOSAGE HAS BEEN CONFIRMED,     SUGGEST FOLLOW UP TESTING     OF ANTITHROMBIN III LEVELS.  HEPARIN LEVEL (UNFRACTIONATED)     Status: Abnormal   Collection Time    10/27/13  3:50 AM      Result Value Ref Range   Heparin Unfractionated 0.82 (*) 0.30 - 0.70 IU/mL   Comment:            IF HEPARIN RESULTS ARE BELOW     EXPECTED VALUES, AND PATIENT     DOSAGE HAS BEEN CONFIRMED,     SUGGEST FOLLOW UP TESTING     OF ANTITHROMBIN III LEVELS.  CBC     Status: Abnormal   Collection Time    10/27/13  3:50 AM      Result Value Ref Range   WBC 8.2  4.0 - 10.5 K/uL   RBC 5.37 (*) 3.87 - 5.11 MIL/uL   Hemoglobin 14.2  12.0 - 15.0 g/dL   HCT 43.1  36.0 - 46.0 %   MCV 80.3  78.0 - 100.0 fL   MCH 26.4  26.0 - 34.0 pg   MCHC 32.9  30.0 - 36.0 g/dL   RDW 14.5  11.5 - 15.5 %   Platelets 181  150 - 400 K/uL  COMPREHENSIVE METABOLIC PANEL     Status: Abnormal   Collection Time    10/27/13  3:50 AM      Result Value Ref Range   Sodium 141  137 - 147 mEq/L   Potassium 3.2 (*) 3.7 - 5.3 mEq/L   Comment: DELTA CHECK NOTED   Chloride 100  96 - 112 mEq/L   CO2 24  19 - 32 mEq/L   Glucose, Bld 102 (*) 70 - 99 mg/dL   BUN 11  6 - 23 mg/dL   Creatinine, Ser 0.92  0.50 - 1.10 mg/dL   Calcium 8.9  8.4 - 10.5 mg/dL   Total Protein 7.2  6.0 - 8.3 g/dL   Albumin 3.4 (*) 3.5 - 5.2 g/dL   AST 19  0 - 37 U/L   ALT 14  0 - 35 U/L   Alkaline Phosphatase 90  39 - 117 U/L   Total Bilirubin 0.4  0.3 - 1.2  mg/dL   GFR calc non Af Amer 63 (*) >90 mL/min   GFR calc Af Amer 74 (*) >90 mL/min   Comment: (NOTE)     The eGFR has been calculated using the CKD EPI equation.     This calculation has not been validated in all clinical situations.     eGFR's  persistently <90 mL/min signify possible Chronic Kidney     Disease.   Anion gap 17 (*) 5 - 15  TSH     Status: None   Collection Time    10/27/13  3:50 AM      Result Value Ref Range   TSH 4.010  0.350 - 4.500 uIU/mL  TROPONIN I     Status: None   Collection Time    10/27/13  3:50 AM      Result Value Ref Range   Troponin I <0.30  <0.30 ng/mL   Comment:            Due to the release kinetics of cTnI,     a negative result within the first hours     of the onset of symptoms does not rule out     myocardial infarction with certainty.     If myocardial infarction is still suspected,     repeat the test at appropriate intervals.   Dg Chest 2 View  10/26/2013   CLINICAL DATA:  Difficulty breathing and chest tightness  EXAM: CHEST  2 VIEW  COMPARISON:  October 18, 2010  FINDINGS: There is no edema or consolidation. Heart is upper normal in size with pulmonary vascularity within normal limits. No adenopathy. There is degenerative change in the thoracic spine.  IMPRESSION: No edema or consolidation.   Electronically Signed   By: Lowella Grip M.D.   On: 10/26/2013 15:19   Ct Angio Chest Pe W/cm &/or Wo Cm  10/26/2013   CLINICAL DATA:  Shortness of breath.  EXAM: CT ANGIOGRAPHY CHEST WITH CONTRAST  TECHNIQUE: Multidetector CT imaging of the chest was performed using the standard protocol during bolus administration of intravenous contrast. Multiplanar CT image reconstructions and MIPs were obtained to evaluate the vascular anatomy.  CONTRAST:  100 mL OMNIPAQUE IOHEXOL 350 MG/ML SOLN  COMPARISON:  CT chest 04/24/2008. PA and lateral chest earlier this same day.  FINDINGS: Bilateral pulmonary emboli are identified. A large embolus is seen the right main pulmonary artery. Saddle embolus at the bifurcation of the left main pulmonary artery is also seen with extensive clot in the descending interlobar arteries bilaterally and their branches. Clot in upper lobe arterial branches is also seen, better on  the left. The RV to LV ratio is 1.41 consistent with right heart strain.  There is no pleural or pericardial effusion. Heart size is upper normal. No axillary, hilar or mediastinal lymphadenopathy is identified. The lungs demonstrate mild dependent atelectasis. No evidence of pulmonary infarct is seen. Previously seen 0.5 cm left lower lobe pulmonary nodule is somewhat difficult to visualize on this exam but is thought to be on image 60 and stable in size.  Incidentally imaged upper abdomen shows partial visualization of a large left renal cyst. Imaged intra-abdominal contents are otherwise unremarkable. Thoracic spondylosis is noted. No lytic or sclerotic bony lesion is identified.  Review of the MIP images confirms the above findings.  IMPRESSION: The study is positive for extensive bilateral pulmonary embolus with evidence of right heart strain consistent with at least submassive PE. The presence of right heart strain has been associated  with increased risk of morbidity and mortality. Consultation with Pulmonary and Critical Care Medicine is recommended.  0.5 cm left lower lobe pulmonary nodule is stable in size consistent with benignity.  Critical Value/emergent results were called by telephone at the time of interpretation on 10/26/2013 at 5:50 pm to [facility] who verbally acknowledged these results.   Electronically Signed   By: Inge Rise M.D.   On: 10/26/2013 17:52    Review of Systems  Constitutional: Positive for malaise/fatigue. Negative for fever.  Respiratory: Positive for shortness of breath.   Cardiovascular: Positive for chest pain.  Gastrointestinal: Negative for nausea, vomiting and abdominal pain.  Genitourinary: Negative for hematuria.  Musculoskeletal: Negative for back pain.  Neurological: Positive for dizziness and weakness. Negative for headaches.  Psychiatric/Behavioral: Negative for substance abuse.    Blood pressure 96/69, pulse 89, temperature 97.9 F (36.6  C), temperature source Oral, resp. rate 13, height 5' 7.5" (1.715 m), weight 149.7 kg (330 lb 0.5 oz), SpO2 97.00%. Physical Exam  Constitutional: She is oriented to person, place, and time. She appears well-nourished.  Cardiovascular: Normal rate and regular rhythm.   No murmur heard. Respiratory: Effort normal and breath sounds normal. No respiratory distress. She has no wheezes.  GI: Soft. Bowel sounds are normal. There is no tenderness.  Musculoskeletal: Normal range of motion.  Neurological: She is alert and oriented to person, place, and time.  Skin: Skin is warm and dry.  Psychiatric: She has a normal mood and affect. Her behavior is normal. Judgment and thought content normal.     Assessment/Plan B PE CTA shows + Rt heart strain +Echo Neg doppler Request for PE lysis per PCCM Pt aware of procedure benefits and risks and agreeable to proceed Consent signed and in chart  [place] A 10/27/2013, 12:44 PM

## 2013-10-27 NOTE — H&P (Signed)
Agree with PA note.  I saw and evaluated [name].  She has large volume bilateral PE and right heart strain both by CT and ECHO.   She is a moderate bleeding risk due to her age.  I feel she is a candidate for USAT and would benefit from therapy.  I discussed the procedure, risks and benefits with her fully.  She understands and agrees to proceed.   We will plan for bilateral USAT with infusion of 24 mg tPA over 12 hours.  Signed,  Sterling Big, MD [facility]

## 2013-10-27 NOTE — Progress Notes (Signed)
ANTICOAGULATION CONSULT NOTE - Follow Up Consult  Pharmacy Consult for heparin Indication: pulmonary embolus  Labs:  Recent Labs  10/26/13 1339 10/26/13 2304  10/27/13 0350 10/27/13 2025 10/27/13 2110 10/27/13 2245  HGB 15.1*  --   --  14.2 10.2*  --   --   HCT 44.0  --   --  43.1 30.7*  --   --   PLT 167  --   --  181 QUESTIONABLE RESULTS, RECOMMEND RECOLLECT TO VERIFY  --   --   HEPARINUNFRC  --   --   < > 0.82*  --  NOT DONE 0.60  CREATININE 1.09  --   --  0.92  --   --   --   TROPONINI  --  <0.30  --  <0.30  --   --   --   < > = values in this interval not displayed.   Assessment/Plan:  68yo female therapeutic on heparin after rate decrease. Will continue gtt at current rate and confirm stable with additional level.   Vernard Gambles, PharmD, BCPS  10/27/2013,11:33 PM

## 2013-10-27 NOTE — Consult Note (Signed)
Name: [name] MRN: [id number] DOB: [birth year]    ADMISSION DATE:  10/26/2013 CONSULTATION DATE:  10/26/2013  REFERRING MD :  EDP  CHIEF COMPLAINT:  Palpitations  BRIEF PATIENT DESCRIPTION:  68 yo female presented with 6 days of palpitations, dyspnea on exertion.  Found to have b/l PE with Rt heart strain on CT chest.  Has hx of PE in 2003.  PCCM asked to assess for thrombolytic therapy.  SIGNIFICANT STUDIES:  8/26 CTA >>> extensive b/l PE's with evidence of right heart strain c/w at least submassive PE. 8/26 Echo >>> EF 55%, grade 1 diastolic dysfx, mod/severe RV dilation with mod RV systolic dysfx, PAS 51 mmHg 8/27 LE Dopplers >>>negative 8/27 Hypercoag panel >>>   SUBJECTIVE:  Very short of breath, dizzy with walking today  VITAL SIGNS: Temp:  [98.1 F (36.7 C)-98.5 F (36.9 C)] 98.1 F (36.7 C) (08/27 0735) Pulse Rate:  [79-99] 81 (08/27 0800) Resp:  [15-29] 22 (08/27 0800) BP: (82-154)/(49-108) 118/59 mmHg (08/27 0800) SpO2:  [88 %-99 %] 96 % (08/27 0800) Weight:  [149.7 kg (330 lb 0.5 oz)-156.5 kg (345 lb 0.3 oz)] 149.7 kg (330 lb 0.5 oz) (08/26 2100)  PHYSICAL EXAMINATION: General: resting comfortably in bed HEEN: NCAT, EOMi PULM: CTA B CV: RRR, loud P2 AB: BS+, soft, nontender Ext: trace edema bilaterally Neuro: A&Ox4, maew  CBC Recent Labs     10/26/13  1339  10/27/13  0350  WBC  7.3  8.2  HGB  15.1*  14.2  HCT  44.0  43.1  PLT  167  181    Coag's No results found for this basename: APTT, INR,  in the last 72 hours  BMET Recent Labs     10/26/13  1339  10/27/13  0350  NA  140  141  K  4.1  3.2*  CL  101  100  CO2  23  24  BUN  12  11  CREATININE  1.09  0.92  GLUCOSE  108*  102*    Electrolytes Recent Labs     10/26/13  1339  10/27/13  0350  CALCIUM  9.6  8.9    Sepsis Markers No results found for this basename: LACTICACIDVEN, PROCALCITON, O2SATVEN,  in the last 72 hours  ABG No results found for this basename:  PHART, PCO2ART, PO2ART,  in the last 72 hours  Liver Enzymes Recent Labs     10/27/13  0350  AST  19  ALT  14  ALKPHOS  90  BILITOT  0.4  ALBUMIN  3.4*    Cardiac Enzymes Recent Labs     10/26/13  1339  10/26/13  2304  10/27/13  0350  TROPONINI   --   <0.30  <0.30  PROBNP  1894.0*   --    --    Imaging Dg Chest 2 View  10/26/2013   CLINICAL DATA:  Difficulty breathing and chest tightness  EXAM: CHEST  2 VIEW  COMPARISON:  October 18, 2010  FINDINGS: There is no edema or consolidation. Heart is upper normal in size with pulmonary vascularity within normal limits. No adenopathy. There is degenerative change in the thoracic spine.  IMPRESSION: No edema or consolidation.   Electronically Signed   By: Bretta Bang M.D.   On: 10/26/2013 15:19   Ct Angio Chest Pe W/cm &/or Wo Cm  10/26/2013   CLINICAL DATA:  Shortness of breath.  EXAM: CT ANGIOGRAPHY CHEST WITH CONTRAST  TECHNIQUE: Multidetector CT imaging of  the chest was performed using the standard protocol during bolus administration of intravenous contrast. Multiplanar CT image reconstructions and MIPs were obtained to evaluate the vascular anatomy.  CONTRAST:  100 mL OMNIPAQUE IOHEXOL 350 MG/ML SOLN  COMPARISON:  CT chest 04/24/2008. PA and lateral chest earlier this same day.  FINDINGS: Bilateral pulmonary emboli are identified. A large embolus is seen the right main pulmonary artery. Saddle embolus at the bifurcation of the left main pulmonary artery is also seen with extensive clot in the descending interlobar arteries bilaterally and their branches. Clot in upper lobe arterial branches is also seen, better on the left. The RV to LV ratio is 1.41 consistent with right heart strain.  There is no pleural or pericardial effusion. Heart size is upper normal. No axillary, hilar or mediastinal lymphadenopathy is identified. The lungs demonstrate mild dependent atelectasis. No evidence of pulmonary infarct is seen. Previously seen 0.5 cm  left lower lobe pulmonary nodule is somewhat difficult to visualize on this exam but is thought to be on image 60 and stable in size.  Incidentally imaged upper abdomen shows partial visualization of a large left renal cyst. Imaged intra-abdominal contents are otherwise unremarkable. Thoracic spondylosis is noted. No lytic or sclerotic bony lesion is identified.  Review of the MIP images confirms the above findings.  IMPRESSION: The study is positive for extensive bilateral pulmonary embolus with evidence of right heart strain consistent with at least submassive PE. The presence of right heart strain has been associated with increased risk of morbidity and mortality. Consultation with Pulmonary and Critical Care Medicine is recommended.  0.5 cm left lower lobe pulmonary nodule is stable in size consistent with benignity.  Critical Value/emergent results were called by telephone at the time of interpretation on 10/26/2013 at 5:50 pm to [facility] who verbally acknowledged these results.   Electronically Signed   By: Drusilla Kanner M.D.   On: 10/26/2013 17:52       Impression:  1) Recurrent pulmonary embolism> submassive 2) Right heart strain by echo, elevated proBNP 3) Obstructive sleep apnea> untreated 4) Morbid obesity  Though she is hemodynamically stable, she remains very symptomatic with ambulation (dizzy, dyspnic) which I believe is due to right heart strain.  She is deconditioned and probably has some degree of RV failure at baseline but describes a sharp change in symptoms about 6 days ago which is consistent with her new PE.  Patients in this situation tend to have a higher mortality. (PESI class III based on age and hypoxemia).    She and I had a lengthy conversation today about our options.  I explained the options of anticoagulation alone vs catheter directed thrombolysis.  She understands that it's not clear if she will have a long term benefit from catheter directed  thrombolysis, and there is a mild but increased bleeding risk with the procedure.  However, because she lives alone she says she wants to "leave this hospital with those clots gone" so she can start exercising sooner and getting back on the road to health.  Plan: -consult IR for catheter directed thrombolysis -continue heparin for now -discuss newer anticoagulation agent vs warfarin prior to discharge  Heber Coldwater, MD [place] [facility] Pager: [phone] Cell: [phone] If no response, call [phone]

## 2013-10-27 NOTE — Progress Notes (Signed)
Dr Allena Katz with teaching services made aware of pts Potassium of 3.2.   Dawson Bills, RN

## 2013-10-27 NOTE — Consult Note (Addendum)
ANTICOAGULATION CONSULT NOTE - Initial Consult  Pharmacy Consult for Heparin Indication: pulmonary embolism  Allergies  Allergen Reactions  . Lisinopril     REACTION: PT with cough    Patient Measurements: Height: 5' 7.5" (171.5 cm) Weight: 330 lb 0.5 oz (149.7 kg) IBW/kg (Calculated) : 62.75 Heparin Dosing Weight: 100.7kg  Vital Signs: Temp: 98.5 F (36.9 C) (08/27 0000) Temp src: Oral (08/27 0000) BP: 124/78 mmHg (08/27 0100) Pulse Rate: 82 (08/27 0100)  Labs:  Recent Labs  10/26/13 1339 10/26/13 2304 10/27/13 0038  HGB 15.1*  --   --   HCT 44.0  --   --   PLT 167  --   --   HEPARINUNFRC  --   --  0.88*  CREATININE 1.09  --   --   TROPONINI  --  <0.30  --     Estimated Creatinine Clearance: 78.2 ml/min (by C-G formula based on Cr of 1.09).   Medical History: Past Medical History  Diagnosis Date  . Depression     followed by Dr. Evelene Croon  . Diverticulosis of colon   . History of hepatitis B   . Hypertension   . Arthritis     osteoarthritis , kness.  [name] History of pulmonary embolism 01/2002  . Obesity   . Fibrocystic breast disease   . Obstructive sleep apnea     refuses CPAP.  [name] Hyperlipidemia   . Chronic kidney disease     Renal insuffucinecy, cr-1.33 in 10/2005.  [name] Left shoulder pain     2/2 fall  . Neck nodule     not erythematous, movable like cyst located at the base of posterior neck on the left side(not painful), will need follow up for   . Pulmonary nodule     rpt CT sowed resolution infectious vs inflammatory    Medications:  Heparin at 1900 units/hr   Assessment: 68yof with history of PE but not on anticoagulants pta presents to the ED with SOB and chest pain since last Friday. CT chest shows extensive bilateral PEs with large clot burden and right heart strain. She will begin IV heparin. Baseline labs wnl. 1st HL is 0.88 IU. No bleeding noted. May likely be a slight bolus effect.   Goal of Therapy:  Heparin level 0.3-0.7  units/ml Monitor platelets by anticoagulation protocol: Yes   Plan: decrease heparin to 1800 units/hr and recheck with am labs.   Janice Coffin   5:58 AM Am HL remains elevated. At 0.82 IU. Will decrease further to 1650 units/hr and recheck HL in 8 hours.   Janice Coffin

## 2013-10-27 NOTE — Progress Notes (Signed)
Subjective: Her friend is at bedside with her. She is comfortably eating breakfast and has no complaints at this time. O2 4L by [place]. She is interested in thrombolytic therapy. LE Dupplex unremarkable for DVT. Echo showed EF 55% with R ventricular overload and moderate pulmonary HTN.   PCCM assessed her to be a good candidate and have consulted IR for the procedure.   Objective: Vital signs in last 24 hours: Filed Vitals:   10/27/13 1506 10/27/13 1512 10/27/13 1520 10/27/13 1540  BP: 149/88 141/84 147/83 149/82  Pulse: 77 82 83 87  Temp:      TempSrc:      Resp: Height:      Weight:      SpO2: 92% 95% 92% 92%   Weight change:   Intake/Output Summary (Last 24 hours) at 10/27/13 1542 Last data filed at 10/27/13 1400  Gross per 24 hour  Intake 1007.65 ml  Output      0 ml  Net 1007.65 ml   General: resting in bed, NAD, wearing [place] HEENT: PERRL, EOMI, no scleral icterus Cardiac: RRR, no rubs, murmurs or gallops Pulm: clear to auscultation bilaterally in the anterior fields, no wheezes, rales, or rhonchi Abd: soft, nontender, nondistended, BS present Ext: warm and well perfused, no pedal edema Neuro: responding appropriately to names and questions   Lab Results: Basic Metabolic Panel:  Recent Labs Lab 10/26/13 1339 10/27/13 0350  NA 140 141  K 4.1 3.2*  CL 101 100  CO2 23 24  GLUCOSE 108* 102*  BUN 12 11  CREATININE 1.09 0.92  CALCIUM 9.6 8.9   Liver Function Tests:  Recent Labs Lab 10/27/13 0350  AST 19  ALT 14  ALKPHOS 90  BILITOT 0.4  PROT 7.2  ALBUMIN 3.4*   CBC:  Recent Labs Lab 10/26/13 1339 10/27/13 0350  WBC 7.3 8.2  HGB 15.1* 14.2  HCT 44.0 43.1  MCV 78.3 80.3  PLT 167 181   Cardiac Enzymes:  Recent Labs Lab 10/26/13 2304 10/27/13 0350  TROPONINI <0.30 <0.30   Thyroid Function Tests:  Recent Labs Lab 10/27/13 0350  TSH 4.010   Studies/Results: Dg Chest 2 View  10/26/2013   CLINICAL DATA:  Difficulty breathing  and chest tightness  EXAM: CHEST  2 VIEW  COMPARISON:  October 18, 2010  FINDINGS: There is no edema or consolidation. Heart is upper normal in size with pulmonary vascularity within normal limits. No adenopathy. There is degenerative change in the thoracic spine.  IMPRESSION: No edema or consolidation.   Electronically Signed   By: Bretta Bang M.D.   On: 10/26/2013 15:19   Ct Angio Chest Pe W/cm &/or Wo Cm  10/26/2013   CLINICAL DATA:  Shortness of breath.  EXAM: CT ANGIOGRAPHY CHEST WITH CONTRAST  TECHNIQUE: Multidetector CT imaging of the chest was performed using the standard protocol during bolus administration of intravenous contrast. Multiplanar CT image reconstructions and MIPs were obtained to evaluate the vascular anatomy.  CONTRAST:  100 mL OMNIPAQUE IOHEXOL 350 MG/ML SOLN  COMPARISON:  CT chest 04/24/2008. PA and lateral chest earlier this same day.  FINDINGS: Bilateral pulmonary emboli are identified. A large embolus is seen the right main pulmonary artery. Saddle embolus at the bifurcation of the left main pulmonary artery is also seen with extensive clot in the descending interlobar arteries bilaterally and their branches. Clot in upper lobe arterial branches is also seen, better on the left. The RV to LV ratio is  1.41 consistent with right heart strain.  There is no pleural or pericardial effusion. Heart size is upper normal. No axillary, hilar or mediastinal lymphadenopathy is identified. The lungs demonstrate mild dependent atelectasis. No evidence of pulmonary infarct is seen. Previously seen 0.5 cm left lower lobe pulmonary nodule is somewhat difficult to visualize on this exam but is thought to be on image 60 and stable in size.  Incidentally imaged upper abdomen shows partial visualization of a large left renal cyst. Imaged intra-abdominal contents are otherwise unremarkable. Thoracic spondylosis is noted. No lytic or sclerotic bony lesion is identified.  Review of the MIP images  confirms the above findings.  IMPRESSION: The study is positive for extensive bilateral pulmonary embolus with evidence of right heart strain consistent with at least submassive PE. The presence of right heart strain has been associated with increased risk of morbidity and mortality. Consultation with Pulmonary and Critical Care Medicine is recommended.  0.5 cm left lower lobe pulmonary nodule is stable in size consistent with benignity.  Critical Value/emergent results were called by telephone at the time of interpretation on 10/26/2013 at 5:50 pm to [facility] who verbally acknowledged these results.   Electronically Signed   By: Drusilla Kanner M.D.   On: 10/26/2013 17:52   Medications: I have reviewed the patient's current medications. Scheduled Meds: . alteplase (tPA/ ACTIVASE) PE Lysis 12 mg/250 mL (BILATERAL)  12 mg Intravenous Once  . alteplase (tPA/ ACTIVASE) PE Lysis 12 mg/250 mL (BILATERAL)  12 mg Intravenous Once  . antiseptic oral rinse  7 mL Mouth Rinse BID  . buPROPion  100 mg Oral TID  . DULoxetine  60 mg Oral Daily  . fentaNYL      . lidocaine      . pantoprazole  40 mg Oral Daily  . simvastatin  20 mg Oral q1800  . sodium chloride  3 mL Intravenous Q12H  . sodium chloride  3 mL Intravenous Q12H   Continuous Infusions: . sodium chloride 10 mL/hr at 10/26/13 2100  . heparin 1,650 Units/hr (10/27/13 0900)   PRN Meds:.sodium chloride, acetaminophen, acetaminophen, ALPRAZolam, HYDROcodone-acetaminophen, ondansetron (ZOFRAN) IV, ondansetron, sodium chloride Assessment/Plan: Active Problems:   OBSTRUCTIVE SLEEP APNEA   HYPERTENSION   CKD (chronic kidney disease), stage II   Prediabetes   Pulmonary emboli   Acute pulmonary embolism  [name] is a 68 year old female with h/o PE s/p coumadin x 6 months, OSA, pre-diabetes, CKD stage 2, HTN hospitalized for bilateral submassive PE now with hypokalemia.   #Bilateral submassive PE: Echo remarkable for signs of R  heart strain though she is otherwise hemodynamically stable. Troponins negative x 2. EKG remarkable for prolonged QTc (461->560) but no ischemic changes.  -Continue heparin gtt with goal to titrate -Transfer to PCCM post-procedure for close monitoring with tPA infusion -Awaiting hypercoagulable panel results: lupus anticoagulant panel, B2glycoprotein abs, homocysteine, factor 5 leiden, prothrombin gene mutation, cardiolipin antibody   #Hypokalemia: K 3.2 this AM. -Replete with KCl IV x 4 since she is tolerating PO intake  #Depression: She denies symptoms this AM though may not be accurate given that her friend is present at bedside. Pharmacy was able to discern her dose of Cymbalta. -Continue Wellbutrin SR  TID  -Continue Cymbalta   #OSA: She is currently stable on 4L O2 though has not tolerated CPAP in the past.  -Continue assessing  #HTN: Stable. -Continue amlodipine  daily  -Continue HCTZ  daily   #GERD: Continue Protonix .    #  HLD: Continue simvastatin .   #FEN:  -Diet: NPO for procedure  #DVT prophylaxis: heparin gtt  #CODE STATUS: FULL CODE    Dispo: Disposition is deferred at this time, awaiting improvement of current medical problems.  Anticipated discharge in approximately 1-2 day(s).   The patient does have a current PCP [name], MD) and does need an [facility] hospital follow-up appointment after discharge.  The patient does not have transportation limitations that hinder transportation to clinic appointments.  .Services Needed at time of discharge: Y = Yes, Blank = No PT:   OT:   RN:   Equipment:   Other:     LOS: 1 day   Heywood Iles, MD 10/27/2013, 3:42 PM

## 2013-10-27 NOTE — H&P (Signed)
  Date: 10/27/2013  Patient name: [name]  Medical record number: [id number]  Date of birth: June 17, 1945   I have seen and evaluated [name] and discussed their care with the Residency Team. [name] was admitted for submassive PE with R heart strain. Her vitals are stable but she has DOE. PCCM has eval pt and determined that she is a candidate for catheter directed thrombolysis and she is in agreement.   On exam, she is sitting in bed eating. She is able to speak in full sentences. Her lungs are clear. HRRR. EXT trace edema B.  Assessment and Plan: I have seen and evaluated the patient as outlined above. I agree with the formulated Assessment and Plan as detailed in the residents' admission note, with the following changes:   1. Submassive PE - Per Dr Garald Braver, PCCM will take over care for now and will consult IR. Pt will be returned to IM service once she is stable.   [name], MD 8/27/20153:25 PM

## 2013-10-27 NOTE — Progress Notes (Signed)
VASCULAR LAB PRELIMINARY  PRELIMINARY  PRELIMINARY  PRELIMINARY  Bilateral lower extremity venous Dopplers completed.    Preliminary report:  There is no obvious evidence of DVT or SVT noted in the bilateral lower extremities.   Jadea Shiffer, RVT 10/27/2013, 9:18 AM

## 2013-10-27 NOTE — Progress Notes (Signed)
PA pressure70/37, [id number]

## 2013-10-27 NOTE — Progress Notes (Signed)
eLink Physician-Brief Progress Note Patient Name: [name] DOB: January 15, 1946 MRN: [id number]   Date of Service  10/27/2013  HPI/Events of Note  68 yo female presented with 6 days of palpitations, dyspnea on exertion. Found to have b/l PE with Rt heart strain on CT chest. Has hx of PE in 2003, seen by PCCM.  Patient transferred to the ICU after receiving systemic tpa and EKOS   eICU Interventions  68 yo female with bilateral PE 1. PE - currently with tpa infusion and ekos - suspected RV strain based on clinical symptoms, however with stable hemodynamics - monitor BP and sats - cont with coag labs     Intervention Category Evaluation Type: New Patient Evaluation  [name] 10/27/2013, 4:38 PM

## 2013-10-27 NOTE — Sedation Documentation (Signed)
PA pressure70/37, Mean49 

## 2013-10-28 ENCOUNTER — Inpatient Hospital Stay (HOSPITAL_COMMUNITY): Payer: Medicare HMO

## 2013-10-28 LAB — CBC
HCT: 37.5 % (ref 36.0–46.0)
HEMATOCRIT: 39.4 % (ref 36.0–46.0)
HEMOGLOBIN: 13.1 g/dL (ref 12.0–15.0)
Hemoglobin: 12.7 g/dL (ref 12.0–15.0)
MCH: 26.8 pg (ref 26.0–34.0)
MCH: 26.3 pg (ref 26.0–34.0)
MCHC: 33.9 g/dL (ref 30.0–36.0)
MCHC: 33.2 g/dL (ref 30.0–36.0)
MCV: 79.1 fL (ref 78.0–100.0)
MCV: 79.1 fL (ref 78.0–100.0)
PLATELETS: 146 10*3/uL — AB (ref 150–400)
Platelets: 145 10*3/uL — ABNORMAL LOW (ref 150–400)
RBC: 4.74 MIL/uL (ref 3.87–5.11)
RBC: 4.98 MIL/uL (ref 3.87–5.11)
RDW: 14.5 % (ref 11.5–15.5)
RDW: 14.4 % (ref 11.5–15.5)
WBC: 7 10*3/uL (ref 4.0–10.5)
WBC: 7 10*3/uL (ref 4.0–10.5)

## 2013-10-28 LAB — HEPARIN LEVEL (UNFRACTIONATED)
HEPARIN UNFRACTIONATED: 0.46 [IU]/mL (ref 0.30–0.70)
Heparin Unfractionated: 0.21 IU/mL — ABNORMAL LOW (ref 0.30–0.70)

## 2013-10-28 LAB — PROTIME-INR
INR: 1.21 (ref 0.00–1.49)
Prothrombin Time: 15.3 seconds — ABNORMAL HIGH (ref 11.6–15.2)

## 2013-10-28 LAB — FIBRINOGEN: Fibrinogen: 410 mg/dL (ref 204–475)

## 2013-10-28 MED ORDER — IOHEXOL 300 MG/ML  SOLN
100.0000 mL | Freq: Once | INTRAMUSCULAR | Status: AC | PRN
  Administered 2013-10-28: 1 mL via INTRAVENOUS

## 2013-10-28 MED ORDER — HEPARIN (PORCINE) IN NACL 100-0.45 UNIT/ML-% IJ SOLN
1800.0000 [IU]/h | INTRAMUSCULAR | Status: AC
  Administered 2013-10-28 (×2): 1650 [IU]/h via INTRAVENOUS
  Administered 2013-10-28: 1800 [IU]/h via INTRAVENOUS
  Filled 2013-10-28 (×2): qty 250

## 2013-10-28 MED ORDER — RIVAROXABAN 15 MG PO TABS
15.0000 mg | ORAL_TABLET | Freq: Two times a day (BID) | ORAL | Status: DC
  Administered 2013-10-29 – 2013-10-31 (×5): 15 mg via ORAL
  Filled 2013-10-28 (×7): qty 1

## 2013-10-28 MED ORDER — LIDOCAINE HCL 1 % IJ SOLN
INTRAMUSCULAR | Status: AC
  Filled 2013-10-28: qty 20

## 2013-10-28 NOTE — Progress Notes (Signed)
ANTICOAGULATION CONSULT NOTE - Follow Up Consult  Pharmacy Consult for heparin Indication: pulmonary embolus  Labs:  Recent Labs  10/26/13 1339 10/26/13 2304  10/27/13 0350 10/27/13 2025 10/27/13 2110 10/27/13 2245 10/28/13 0230  HGB 15.1*  --   --  14.2 10.2*  --   --  12.7  HCT 44.0  --   --  43.1 30.7*  --   --  37.5  PLT 167  --   --  181 QUESTIONABLE RESULTS, RECOMMEND RECOLLECT TO VERIFY  --   --  146*  LABPROT  --   --   --   --   --   --   --  15.3*  INR  --   --   --   --   --   --   --  1.21  HEPARINUNFRC  --   --   < > 0.82*  --  NOT DONE 0.60 0.46  CREATININE 1.09  --   --  0.92  --   --   --   --   TROPONINI  --  <0.30  --  <0.30  --   --   --   --   < > = values in this interval not displayed.   Assessment:  68 yof continuing on heparin for large volume bilateral PE (s/p EKOS protocol). TPA d/c'd HL therapeutic 0.46 this AM  units/hr--However, heparin stopped while in IR to have catheter/sheath removed. To restart heparin at same rate per IR and transition to rivaroxaban tomorrow AM. Heparin to be turned off at same time rivaroxaban 1st dose given 8/29  with breakfast. Hg ok, plt low trending down (not significant currently). No bleeding per RN.  Plan: -Restart hep  units/hr per IR -Turn off hep tomorrow 8/29 at same time 1st dose rivaroxaban given  with breakfast -6-hr HL  -Daily HL/CBC -Mon s/sx bleed

## 2013-10-28 NOTE — Progress Notes (Signed)
Subjective: B PE lysis started 8/27 4pm tpa infusion dc'd at 4am Hep off at 7am per Dr Archer Asa order Now scheduled for recheck in IR Pt doing well Does complain of "sensation" in Rt chest and mild discomfort with deep breath mid chest  Objective: Vital signs in last 24 hours: Temp:  [97.9 F (36.6 C)-99 F (37.2 C)] 99 F (37.2 C) (08/28 0356) Pulse Rate:  [77-90] 81 (08/28 0800) Resp:  [10-30] 20 (08/28 0800) BP: (93-149)/(61-97) 133/72 mmHg (08/28 0800) SpO2:  [88 %-100 %] 97 % (08/28 0800) Last BM Date: 10/25/13  Intake/Output from previous day: 08/27 0701 - 08/28 0700 In: 3292.5 [P.O.:600; I.V.:2592.5; IV Piggyback:100] Out: 475 [Urine:475] Intake/Output this shift:    PE:  Afeb; vss H/H 12.7/37.5 Lungs: CTA Rt groin site: clean and dry; NT; no hematoma Rt foot 1+ pulses   Lab Results:   Recent Labs  10/27/13 2025 10/28/13 0230  WBC 5.3 7.0  HGB 10.2* 12.7  HCT 30.7* 37.5  PLT QUESTIONABLE RESULTS, RECOMMEND RECOLLECT TO VERIFY 146*   BMET  Recent Labs  10/26/13 1339 10/27/13 0350  NA 140 141  K 4.1 3.2*  CL 101 100  CO2 23 24  GLUCOSE 108* 102*  BUN 12 11  CREATININE 1.09 0.92  CALCIUM 9.6 8.9   PT/INR  Recent Labs  10/28/13 0230  LABPROT 15.3*  INR 1.21   ABG No results found for this basename: PHART, PCO2, PO2, HCO3,  in the last 72 hours  Studies/Results: Dg Chest 2 View  10/26/2013   CLINICAL DATA:  Difficulty breathing and chest tightness  EXAM: CHEST  2 VIEW  COMPARISON:  October 18, 2010  FINDINGS: There is no edema or consolidation. Heart is upper normal in size with pulmonary vascularity within normal limits. No adenopathy. There is degenerative change in the thoracic spine.  IMPRESSION: No edema or consolidation.   Electronically Signed   By: Bretta Bang M.D.   On: 10/26/2013 15:19   Ct Angio Chest Pe W/cm &/or Wo Cm  10/26/2013   CLINICAL DATA:  Shortness of breath.  EXAM: CT ANGIOGRAPHY CHEST WITH CONTRAST   TECHNIQUE: Multidetector CT imaging of the chest was performed using the standard protocol during bolus administration of intravenous contrast. Multiplanar CT image reconstructions and MIPs were obtained to evaluate the vascular anatomy.  CONTRAST:  100 mL OMNIPAQUE IOHEXOL 350 MG/ML SOLN  COMPARISON:  CT chest 04/24/2008. PA and lateral chest earlier this same day.  FINDINGS: Bilateral pulmonary emboli are identified. A large embolus is seen the right main pulmonary artery. Saddle embolus at the bifurcation of the left main pulmonary artery is also seen with extensive clot in the descending interlobar arteries bilaterally and their branches. Clot in upper lobe arterial branches is also seen, better on the left. The RV to LV ratio is 1.41 consistent with right heart strain.  There is no pleural or pericardial effusion. Heart size is upper normal. No axillary, hilar or mediastinal lymphadenopathy is identified. The lungs demonstrate mild dependent atelectasis. No evidence of pulmonary infarct is seen. Previously seen 0.5 cm left lower lobe pulmonary nodule is somewhat difficult to visualize on this exam but is thought to be on image 60 and stable in size.  Incidentally imaged upper abdomen shows partial visualization of a large left renal cyst. Imaged intra-abdominal contents are otherwise unremarkable. Thoracic spondylosis is noted. No lytic or sclerotic bony lesion is identified.  Review of the MIP images confirms the above findings.  IMPRESSION: The study  is positive for extensive bilateral pulmonary embolus with evidence of right heart strain consistent with at least submassive PE. The presence of right heart strain has been associated with increased risk of morbidity and mortality. Consultation with Pulmonary and Critical Care Medicine is recommended.  0.5 cm left lower lobe pulmonary nodule is stable in size consistent with benignity.  Critical Value/emergent results were called by telephone at the time of  interpretation on 10/26/2013 at 5:50 pm to [facility] who verbally acknowledged these results.   Electronically Signed   By: Drusilla Kanner M.D.   On: 10/26/2013 17:52   Ir Angiogram Pulmonary Bilateral Selective  10/27/2013   CLINICAL DATA:  68 year old female with large volume bilateral acute pulmonary emboli and evidence of right heart strain both by CT and echocardiography consistent with sub massive PE. She presents for initiation of bilateral ultrasound accelerated catheter directed thrombolysis (USAT).  EXAM: IR INFUSION THROMBOL ARTERIAL INITIAL (MS); IR ULTRASOUND GUIDANCE VASC ACCESS RIGHT; ADDITIONAL ARTERIOGRAPHY; BILATERAL PULMONARY ARTERIOGRAPHY  Date: 10/27/2013  PROCEDURE: 1. Ultrasound-guided puncture of the right common femoral vein 2. Second ultrasound-guided puncture of the right common femoral vein 3. Catheterization of the right main pulmonary artery with limited arteriogram 4. Catheterization of the right lower lobe pulmonary artery with arteriogram 5. Catheterization of the left main pulmonary artery with arteriogram 6. Catheterization of the left lower lobe pulmonary artery with arteriogram 7. Placement of bilateral 12 cm EKOS infusion catheters 8. Initiation of pulmonary arterial lysis Interventional Radiologist:  Sterling Big, MD  ANESTHESIA/SEDATION: 100 mcg fentanyl administered intravenously  Total Moderate Sedation Time  45 minutes minutes.  FLUOROSCOPY TIME:  13 minutes 42 seconds.  CONTRAST:  15mL OMNIPAQUE IOHEXOL 300 MG/ML  SOLN  TECHNIQUE: Informed consent was obtained from the patient following explanation of the procedure, risks, benefits and alternatives. The patient understands, agrees and consents for the procedure. All questions were addressed. A time out was performed.  Maximal barrier sterile technique utilized including caps, mask, sterile gowns, sterile gloves, large sterile drape, hand hygiene, and Betadine skin prep.  The right groin was  interrogated with ultrasound. The right common femoral vein is patent and compressible. No evidence of a DVT. Image was obtained and stored for the medical record. Local anesthesia was attained by infiltration with 1% lidocaine. A small dermatotomy was made. Under real-time sonographic guidance, a 21 gauge micropuncture needle was used to puncture the common femoral vein. With the aid of a 5 [place] transitional micro sheath the 0.018 wire was exchanged for a Bentson wire and a 6 [place] vascular sheath was advanced into the vein.  Immediately above the initial access, a second ultrasound-guided puncture the common femoral vein was obtained in the same manner. In similar fashion, a second 6 [place] vascular sheath was advanced into the right common femoral vein.  Using a C2 Cobra catheter and Bentson wire, the pulmonary arterial trunk was catheterized. Pressure measurements were obtained. The main pulmonary arterial pressure is 70/30 (mean 49) mmHg.  The catheter was then advanced into the right main pulmonary artery. A right pulmonary arteriogram was performed. Thrombus is present within the right main pulmonary artery extending into the right upper lobe pulmonary artery. Additionally, there is a filling defect in the proximal left lower lobe pulmonary artery. Overall, there is very limited perfusion of both the right upper and lower lobe pulmonary arteries.  The catheter was navigated into the right lower lobe pulmonary artery in a second hand injection confirmed catheter location. A Pollyann Kennedy  wire was advanced through the catheter. Through the second 6 [place] vascular sheath a 100 cm angled catheter and a stiff Glidewire were used to select the left main pulmonary artery. Pulmonary arteriography was performed. The filling defects are present within multiple segmental branches of the left lower lobe pulmonary artery. The catheter was then advanced into the left lower lobe pulmonary artery and a second pulmonary  arteriogram was performed confirming catheter location. A second Rosen wire was positioned in the left lower lobe pulmonary artery.  12 cm length EKOS catheters were then advanced over the Rosen wires and positioned in both the right and left lower lobe pulmonary arteries. The catheters extend into the main pulmonary arteries bilaterally.  The catheters were flushed and bilateral USAT was initiated with 1 mg/hr TPA infused into each catheter.  COMPLICATIONS: None immediate  IMPRESSION: 1. Right worse than left bilateral sub massive pulmonary emboli. 2. Critical pulmonary arterial hypertension. The main pulmonary arterial mean pressures 49 mm Hg. 3. Successful initiation of bilateral ultrasound-accelerated pulmonary arterial thrombolysis. Signed,  Sterling Big, MD  [facility]  [facility]   Electronically Signed   By: Malachy Moan M.D.   On: 10/27/2013 16:08   Ir Angiogram Selective Each Additional Vessel  10/27/2013   CLINICAL DATA:  68 year old female with large volume bilateral acute pulmonary emboli and evidence of right heart strain both by CT and echocardiography consistent with sub massive PE. She presents for initiation of bilateral ultrasound accelerated catheter directed thrombolysis (USAT).  EXAM: IR INFUSION THROMBOL ARTERIAL INITIAL (MS); IR ULTRASOUND GUIDANCE VASC ACCESS RIGHT; ADDITIONAL ARTERIOGRAPHY; BILATERAL PULMONARY ARTERIOGRAPHY  Date: 10/27/2013  PROCEDURE: 1. Ultrasound-guided puncture of the right common femoral vein 2. Second ultrasound-guided puncture of the right common femoral vein 3. Catheterization of the right main pulmonary artery with limited arteriogram 4. Catheterization of the right lower lobe pulmonary artery with arteriogram 5. Catheterization of the left main pulmonary artery with arteriogram 6. Catheterization of the left lower lobe pulmonary artery with arteriogram 7. Placement of bilateral 12 cm EKOS infusion  catheters 8. Initiation of pulmonary arterial lysis Interventional Radiologist:  Sterling Big, MD  ANESTHESIA/SEDATION: 100 mcg fentanyl administered intravenously  Total Moderate Sedation Time  45 minutes minutes.  FLUOROSCOPY TIME:  13 minutes 42 seconds.  CONTRAST:  15mL OMNIPAQUE IOHEXOL 300 MG/ML  SOLN  TECHNIQUE: Informed consent was obtained from the patient following explanation of the procedure, risks, benefits and alternatives. The patient understands, agrees and consents for the procedure. All questions were addressed. A time out was performed.  Maximal barrier sterile technique utilized including caps, mask, sterile gowns, sterile gloves, large sterile drape, hand hygiene, and Betadine skin prep.  The right groin was interrogated with ultrasound. The right common femoral vein is patent and compressible. No evidence of a DVT. Image was obtained and stored for the medical record. Local anesthesia was attained by infiltration with 1% lidocaine. A small dermatotomy was made. Under real-time sonographic guidance, a 21 gauge micropuncture needle was used to puncture the common femoral vein. With the aid of a 5 [place] transitional micro sheath the 0.018 wire was exchanged for a Bentson wire and a 6 [place] vascular sheath was advanced into the vein.  Immediately above the initial access, a second ultrasound-guided puncture the common femoral vein was obtained in the same manner. In similar fashion, a second 6 [place] vascular sheath was advanced into the right common femoral vein.  Using a C2 Cobra catheter and Bentson wire,  the pulmonary arterial trunk was catheterized. Pressure measurements were obtained. The main pulmonary arterial pressure is 70/30 (mean 49) mmHg.  The catheter was then advanced into the right main pulmonary artery. A right pulmonary arteriogram was performed. Thrombus is present within the right main pulmonary artery extending into the right upper lobe pulmonary artery. Additionally,  there is a filling defect in the proximal left lower lobe pulmonary artery. Overall, there is very limited perfusion of both the right upper and lower lobe pulmonary arteries.  The catheter was navigated into the right lower lobe pulmonary artery in a second hand injection confirmed catheter location. A Rosen wire was advanced through the catheter. Through the second 6 French vascular sheath a 100 cm angled catheter and a stiff Glidewire were used to select the left main pulmonary artery. Pulmonary arteriography was performed. The filling defects are present within multiple segmental branches of the left lower lobe pulmonary artery. The catheter was then advanced into the left lower lobe pulmonary artery and a second pulmonary arteriogram was performed confirming catheter location. A second Rosen wire was positioned in the left lower lobe pulmonary artery.  12 cm length EKOS catheters were then advanced over the Rosen wires and positioned in both the right and left lower lobe pulmonary arteries. The catheters extend into the main pulmonary arteries bilaterally.  The catheters were flushed and bilateral USAT was initiated with 1 mg/hr TPA infused into each catheter.  COMPLICATIONS: None immediate  IMPRESSION: 1. Right worse than left bilateral sub massive pulmonary emboli. 2. Critical pulmonary arterial hypertension. The main pulmonary arterial mean pressures 49 mm Hg. 3. Successful initiation of bilateral ultrasound-accelerated pulmonary arterial thrombolysis. Signed,  Heath K. McCullough, MD  [facility]  [facility]   Electronically Signed   By: Heath  McCullough M.D.   On: 10/27/2013 16:08   Ir Angiogram Selective Each Additional Vessel  10/27/2013   CLINICAL DATA:  68 year old female with large volume bilateral acute pulmonary emboli and evidence of right heart strain both by CT and echocardiography consistent with sub massive PE. She presents for initiation of  bilateral ultrasound accelerated catheter directed thrombolysis (USAT).  EXAM: IR INFUSION THROMBOL ARTERIAL INITIAL (MS); IR ULTRASOUND GUIDANCE VASC ACCESS RIGHT; ADDITIONAL ARTERIOGRAPHY; BILATERAL PULMONARY ARTERIOGRAPHY  Date: 10/27/2013  PROCEDURE: 1. Ultrasound-guided puncture of the right common femoral vein 2. Second ultrasound-guided puncture of the right common femoral vein 3. Catheterization of the right main pulmonary artery with limited arteriogram 4. Catheterization of the right lower lobe pulmonary artery with arteriogram 5. Catheterization of the left main pulmonary artery with arteriogram 6. Catheterization of the left lower lobe pulmonary artery with arteriogram 7. Placement of bilateral 12 cm EKOS infusion catheters 8. Initiation of pulmonary arterial lysis Interventional Radiologist:  Heath K. McCullough, MD  ANESTHESIA/SEDATION: 100 mcg fentanyl administered intravenously  Total Moderate Sedation Time  45 minutes minutes.  FLUOROSCOPY TIME:  13 minutes 42 seconds.  CONTRAST:  25mLHKor67matHKor79matHKor74matHKor36ma[id number]reaton FoleyUE IOHEXOL 300 MG/ML  SOLN  TECHNIQUE: Informed consent was obtained from the patient following explanation of the procedure, risks, benefits and alternatives. The patient understands, agrees and consents for the procedure. All questions were addressed. A time out was performed.  Maximal barrier sterile technique utilized including caps, mask, sterile gowns, sterile gloves, large sterile drape, hand hygiene, and Betadine skin prep.  The right groin was interrogated with ultrasound. The right common femoral vein is patent and compressible. No evidence of a DVT. Image was obtained and stored for the medical record.  Local anesthesia was attained by infiltration with 1% lidocaine. A small dermatotomy was made. Under real-time sonographic guidance, a 21 gauge micropuncture needle was used to puncture the common femoral vein. With the aid of a 5 [place] transitional micro sheath the 0.018 wire was exchanged for a  Bentson wire and a 6 [place] vascular sheath was advanced into the vein.  Immediately above the initial access, a second ultrasound-guided puncture the common femoral vein was obtained in the same manner. In similar fashion, a second 6 [place] vascular sheath was advanced into the right common femoral vein.  Using a C2 Cobra catheter and Bentson wire, the pulmonary arterial trunk was catheterized. Pressure measurements were obtained. The main pulmonary arterial pressure is 70/30 (mean 49) mmHg.  The catheter was then advanced into the right main pulmonary artery. A right pulmonary arteriogram was performed. Thrombus is present within the right main pulmonary artery extending into the right upper lobe pulmonary artery. Additionally, there is a filling defect in the proximal left lower lobe pulmonary artery. Overall, there is very limited perfusion of both the right upper and lower lobe pulmonary arteries.  The catheter was navigated into the right lower lobe pulmonary artery in a second hand injection confirmed catheter location. A Rosen wire was advanced through the catheter. Through the second 6 [place] vascular sheath a 100 cm angled catheter and a stiff Glidewire were used to select the left main pulmonary artery. Pulmonary arteriography was performed. The filling defects are present within multiple segmental branches of the left lower lobe pulmonary artery. The catheter was then advanced into the left lower lobe pulmonary artery and a second pulmonary arteriogram was performed confirming catheter location. A second Rosen wire was positioned in the left lower lobe pulmonary artery.  12 cm length EKOS catheters were then advanced over the Rosen wires and positioned in both the right and left lower lobe pulmonary arteries. The catheters extend into the main pulmonary arteries bilaterally.  The catheters were flushed and bilateral USAT was initiated with 1 mg/hr TPA infused into each catheter.  COMPLICATIONS: None  immediate  IMPRESSION: 1. Right worse than left bilateral sub massive pulmonary emboli. 2. Critical pulmonary arterial hypertension. The main pulmonary arterial mean pressures 49 mm Hg. 3. Successful initiation of bilateral ultrasound-accelerated pulmonary arterial thrombolysis. Signed,  Sterling Big, MD  [facility]  [facility]   Electronically Signed   By: Malachy Moan M.D.   On: 10/27/2013 16:08   Ir US Guide Vasc Access Right  10/27/2013   CLINICAL DATA:  68 year old female with large volume bilateral acute pulmonary emboli and evidence of right heart strain both by CT and echocardiography consistent with sub massive PE. She presents for initiation of bilateral ultrasound accelerated catheter directed thrombolysis (USAT).  EXAM: IR INFUSION THROMBOL ARTERIAL INITIAL (MS); IR ULTRASOUND GUIDANCE VASC ACCESS RIGHT; ADDITIONAL ARTERIOGRAPHY; BILATERAL PULMONARY ARTERIOGRAPHY  Date: 10/27/2013  PROCEDURE: 1. Ultrasound-guided puncture of the right common femoral vein 2. Second ultrasound-guided puncture of the right common femoral vein 3. Catheterization of the right main pulmonary artery with limited arteriogram 4. Catheterization of the right lower lobe pulmonary artery with arteriogram 5. Catheterization of the left main pulmonary artery with arteriogram 6. Catheterization of the left lower lobe pulmonary artery with arteriogram 7. Placement of bilateral 12 cm EKOS infusion catheters 8. Initiation of pulmonary arterial lysis Interventional Radiologist:  Sterling Big, MD  ANESTHESIA/SEDATION: 100 mcg fentanyl administered intravenously  Total Moderate Sedation Time  45 minutes minutes.  FLUOROSCOPY TIME:  13 minutes 42 seconds.  CONTRAST:  15mL OMNIPAQUE IOHEXOL 300 MG/ML  SOLN  TECHNIQUE: Informed consent was obtained from the patient following explanation of the procedure, risks, benefits and alternatives. The patient understands, agrees and  consents for the procedure. All questions were addressed. A time out was performed.  Maximal barrier sterile technique utilized including caps, mask, sterile gowns, sterile gloves, large sterile drape, hand hygiene, and Betadine skin prep.  The right groin was interrogated with ultrasound. The right common femoral vein is patent and compressible. No evidence of a DVT. Image was obtained and stored for the medical record. Local anesthesia was attained by infiltration with 1% lidocaine. A small dermatotomy was made. Under real-time sonographic guidance, a 21 gauge micropuncture needle was used to puncture the common femoral vein. With the aid of a 5 [place] transitional micro sheath the 0.018 wire was exchanged for a Bentson wire and a 6 [place] vascular sheath was advanced into the vein.  Immediately above the initial access, a second ultrasound-guided puncture the common femoral vein was obtained in the same manner. In similar fashion, a second 6 [place] vascular sheath was advanced into the right common femoral vein.  Using a C2 Cobra catheter and Bentson wire, the pulmonary arterial trunk was catheterized. Pressure measurements were obtained. The main pulmonary arterial pressure is 70/30 (mean 49) mmHg.  The catheter was then advanced into the right main pulmonary artery. A right pulmonary arteriogram was performed. Thrombus is present within the right main pulmonary artery extending into the right upper lobe pulmonary artery. Additionally, there is a filling defect in the proximal left lower lobe pulmonary artery. Overall, there is very limited perfusion of both the right upper and lower lobe pulmonary arteries.  The catheter was navigated into the right lower lobe pulmonary artery in a second hand injection confirmed catheter location. A Rosen wire was advanced through the catheter. Through the second 6 [place] vascular sheath a 100 cm angled catheter and a stiff Glidewire were used to select the left main pulmonary  artery. Pulmonary arteriography was performed. The filling defects are present within multiple segmental branches of the left lower lobe pulmonary artery. The catheter was then advanced into the left lower lobe pulmonary artery and a second pulmonary arteriogram was performed confirming catheter location. A second Rosen wire was positioned in the left lower lobe pulmonary artery.  12 cm length EKOS catheters were then advanced over the Rosen wires and positioned in both the right and left lower lobe pulmonary arteries. The catheters extend into the main pulmonary arteries bilaterally.  The catheters were flushed and bilateral USAT was initiated with 1 mg/hr TPA infused into each catheter.  COMPLICATIONS: None immediate  IMPRESSION: 1. Right worse than left bilateral sub massive pulmonary emboli. 2. Critical pulmonary arterial hypertension. The main pulmonary arterial mean pressures 49 mm Hg. 3. Successful initiation of bilateral ultrasound-accelerated pulmonary arterial thrombolysis. Signed,  Sterling Big, MD  [facility]  [facility]   Electronically Signed   By: Malachy Moan M.D.   On: 10/27/2013 16:08   Ir Infusion Thrombol Arterial Initial (ms)  10/27/2013   CLINICAL DATA:  68 year old female with large volume bilateral acute pulmonary emboli and evidence of right heart strain both by CT and echocardiography consistent with sub massive PE. She presents for initiation of bilateral ultrasound accelerated catheter directed thrombolysis (USAT).  EXAM: IR INFUSION THROMBOL ARTERIAL INITIAL (MS); IR ULTRASOUND GUIDANCE VASC ACCESS RIGHT; ADDITIONAL ARTERIOGRAPHY; BILATERAL PULMONARY  ARTERIOGRAPHY  Date: 10/27/2013  PROCEDURE: 1. Ultrasound-guided puncture of the right common femoral vein 2. Second ultrasound-guided puncture of the right common femoral vein 3. Catheterization of the right main pulmonary artery with limited arteriogram 4. Catheterization of  the right lower lobe pulmonary artery with arteriogram 5. Catheterization of the left main pulmonary artery with arteriogram 6. Catheterization of the left lower lobe pulmonary artery with arteriogram 7. Placement of bilateral 12 cm EKOS infusion catheters 8. Initiation of pulmonary arterial lysis Interventional Radiologist:  Sterling Big, MD  ANESTHESIA/SEDATION: 100 mcg fentanyl administered intravenously  Total Moderate Sedation Time  45 minutes minutes.  FLUOROSCOPY TIME:  13 minutes 42 seconds.  CONTRAST:  15mL OMNIPAQUE IOHEXOL 300 MG/ML  SOLN  TECHNIQUE: Informed consent was obtained from the patient following explanation of the procedure, risks, benefits and alternatives. The patient understands, agrees and consents for the procedure. All questions were addressed. A time out was performed.  Maximal barrier sterile technique utilized including caps, mask, sterile gowns, sterile gloves, large sterile drape, hand hygiene, and Betadine skin prep.  The right groin was interrogated with ultrasound. The right common femoral vein is patent and compressible. No evidence of a DVT. Image was obtained and stored for the medical record. Local anesthesia was attained by infiltration with 1% lidocaine. A small dermatotomy was made. Under real-time sonographic guidance, a 21 gauge micropuncture needle was used to puncture the common femoral vein. With the aid of a 5 [place] transitional micro sheath the 0.018 wire was exchanged for a Bentson wire and a 6 [place] vascular sheath was advanced into the vein.  Immediately above the initial access, a second ultrasound-guided puncture the common femoral vein was obtained in the same manner. In similar fashion, a second 6 [place] vascular sheath was advanced into the right common femoral vein.  Using a C2 Cobra catheter and Bentson wire, the pulmonary arterial trunk was catheterized. Pressure measurements were obtained. The main pulmonary arterial pressure is 70/30 (mean 49)  mmHg.  The catheter was then advanced into the right main pulmonary artery. A right pulmonary arteriogram was performed. Thrombus is present within the right main pulmonary artery extending into the right upper lobe pulmonary artery. Additionally, there is a filling defect in the proximal left lower lobe pulmonary artery. Overall, there is very limited perfusion of both the right upper and lower lobe pulmonary arteries.  The catheter was navigated into the right lower lobe pulmonary artery in a second hand injection confirmed catheter location. A Rosen wire was advanced through the catheter. Through the second 6 [place] vascular sheath a 100 cm angled catheter and a stiff Glidewire were used to select the left main pulmonary artery. Pulmonary arteriography was performed. The filling defects are present within multiple segmental branches of the left lower lobe pulmonary artery. The catheter was then advanced into the left lower lobe pulmonary artery and a second pulmonary arteriogram was performed confirming catheter location. A second Rosen wire was positioned in the left lower lobe pulmonary artery.  12 cm length EKOS catheters were then advanced over the Rosen wires and positioned in both the right and left lower lobe pulmonary arteries. The catheters extend into the main pulmonary arteries bilaterally.  The catheters were flushed and bilateral USAT was initiated with 1 mg/hr TPA infused into each catheter.  COMPLICATIONS: None immediate  IMPRESSION: 1. Right worse than left bilateral sub massive pulmonary emboli. 2. Critical pulmonary arterial hypertension. The main pulmonary arterial mean pressures 49 mm Hg. 3. Successful initiation  of bilateral ultrasound-accelerated pulmonary arterial thrombolysis. Signed,  Sterling Big, MD  [facility]  [facility]   Electronically Signed   By: Malachy Moan M.D.   On: 10/27/2013 16:08     Anti-infectives: Anti-infectives   None      Assessment/Plan: s/p * No surgery found *  B PE thrombolysis initiated 8/27 For recheck today Will need CT angio 8/29   LOS: 2 days    Avrohom Mckelvin A 10/28/2013

## 2013-10-28 NOTE — Progress Notes (Signed)
BRIEF PATIENT DESCRIPTION:  68 yo female presented with 6 days of palpitations, dyspnea on exertion. Found to have b/l PE with Rt heart strain on CT chest. Has hx of PE in 2003. PCCM asked to assess for thrombolytic therapy.  SIGNIFICANT STUDIES:  8/26 CTA >>> extensive b/l PE's with evidence of right heart strain c/w at least submassive PE.  8/26 Echo >>> EF 55%, grade 1 diastolic dysfx, mod/severe RV dilation with mod RV systolic dysfx, PAS 51 mmHg  8/27 LE Dopplers >>>negative  8/27 Hypercoag panel >>>   No distress No new complaints EKOS cath removed this AM  Filed Vitals:   10/28/13 0950 10/28/13 0955 10/28/13 1000 10/28/13 1100  BP: 153/72 140/85 141/83 134/75  Pulse: 77 80  78  Temp:      TempSrc:      Resp: Height:      Weight:      SpO2: 97% 99%  97%   NAD JVP cannot be visualized Chest clear anteriorly RRR Obese, soft, NT, NABS Ext warm without edema  I have reviewed all of today's lab results. Relevant abnormalities are discussed in the A/P section  No new CXR  IMPRESSION: 1) Recurrent pulmonary embolism> submassive  2) Right heart strain by echo, elevated proBNP  3) Obstructive sleep apnea> untreated  4) Morbid obesity  PLAN/RECS: Discussed with Dr Elmon Kirschner of IR Transfer to tele Advance diet and activity Cont heparin through today Initiate rivaroxaban AM 8/29 Discussed with Dr Allena Katz of [facility] - she remains on their service PCCM will f/u next week to tie up loose ends and arrange f/u for OSA  Please call PCCM over WE as needed  Billy Fischer, MD ; [facility] service [place] [phone].  After 5:30 PM or weekends, call [phone]

## 2013-10-28 NOTE — Sedation Documentation (Signed)
PA pressures:  Right PA Ekos 62/33 Mean 45                Pigtail 61/20 Mean 38  Left PA   Ekos 64/32 Mean 42                Pigtail  63/29 Mean 43  Main PA Ekos 59/15 Mean 35 Recheck 59/16 Mean 34                Pigtail 62/15 Mean 36

## 2013-10-28 NOTE — Progress Notes (Signed)
ANTICOAGULATION CONSULT NOTE - Follow Up Consult  Pharmacy Consult for heparin Indication: pulmonary embolus  Labs:  Recent Labs  10/26/13 1339 10/26/13 2304  10/27/13 0350 10/27/13 2025  10/27/13 2245 10/28/13 0230 10/28/13 1630 10/28/13 1800  HGB 15.1*  --   --  14.2 10.2*  --   --  12.7 13.1  --   HCT 44.0  --   --  43.1 30.7*  --   --  37.5 39.4  --   PLT 167  --   --  181 QUESTIONABLE RESULTS, RECOMMEND RECOLLECT TO VERIFY  --   --  146* 145*  --   LABPROT  --   --   --   --   --   --   --  15.3*  --   --   INR  --   --   --   --   --   --   --  1.21  --   --   HEPARINUNFRC  --   --   < > 0.82*  --   < > 0.60 0.46  --  0.21*  CREATININE 1.09  --   --  0.92  --   --   --   --   --   --   TROPONINI  --  <0.30  --  <0.30  --   --   --   --   --   --   < > = values in this interval not displayed.   Assessment:  68 yof continuing on heparin for large volume bilateral PE (s/p EKOS protocol). TPA d/c'd HL therapeutic 0.46 this AM  units/hr--However, heparin stopped while in IR to have catheter/sheath removed. To restart heparin at same rate per IR and transition to rivaroxaban tomorrow AM. Heparin to be turned off at same time rivaroxaban 1st dose given 8/29  with breakfast. Hg ok, plt low trending down (not significant currently). No bleeding per RN.  HL after heparin restarted at 1650 units/hr is low at 0.21.   Plan: - increase heparin drip to 1800 units/hr -Turn off hep tomorrow 8/29 at same time 1st dose rivaroxaban given  with breakfast -- DC Daily HL and CBC -Mon s/sx bleed Herby Abraham, Pharm.D. [phone] 10/28/2013 7:03 PM

## 2013-10-28 NOTE — Procedures (Signed)
Technically successful USAT of the bilateral pulmonary arteries. Pre-procedural main PA pressure: 70/30 (49) Post-procedural main PA pressure: 62/15 (36) Mean difference in PA pressures: 13 mmHg Access: Right groin, hemostasis removed with manual compression.

## 2013-10-28 NOTE — Sedation Documentation (Addendum)
For PE lysis recheck, for PA pressures and catheter/sheath removal. No sedation to be given

## 2013-10-29 ENCOUNTER — Other Ambulatory Visit: Payer: Self-pay | Admitting: Radiology

## 2013-10-29 DIAGNOSIS — I2699 Other pulmonary embolism without acute cor pulmonale: Secondary | ICD-10-CM

## 2013-10-29 DIAGNOSIS — E876 Hypokalemia: Secondary | ICD-10-CM

## 2013-10-29 LAB — BASIC METABOLIC PANEL
Anion gap: 14 (ref 5–15)
BUN: 7 mg/dL (ref 6–23)
CO2: 22 mEq/L (ref 19–32)
Calcium: 8.9 mg/dL (ref 8.4–10.5)
Chloride: 101 mEq/L (ref 96–112)
Creatinine, Ser: 0.93 mg/dL (ref 0.50–1.10)
GFR calc Af Amer: 73 mL/min — ABNORMAL LOW (ref >90)
GFR, EST NON AFRICAN AMERICAN: 63 mL/min — AB (ref >90)
Glucose, Bld: 104 mg/dL — ABNORMAL HIGH (ref 70–99)
Potassium: 3.2 mEq/L — ABNORMAL LOW (ref 3.7–5.3)
SODIUM: 137 meq/L (ref 137–147)

## 2013-10-29 MED ORDER — SODIUM CHLORIDE 0.9 % IV SOLN: INTRAVENOUS | Status: DC

## 2013-10-29 MED ORDER — POTASSIUM CHLORIDE CRYS ER 20 MEQ PO TBCR
40.0000 meq | EXTENDED_RELEASE_TABLET | Freq: Once | ORAL | Status: AC
Start: 2013-10-29 — End: 2013-10-29
  Administered 2013-10-29: 40 meq via ORAL
  Filled 2013-10-29: qty 2

## 2013-10-29 MED ORDER — POTASSIUM CHLORIDE 10 MEQ/100ML IV SOLN
10.0000 meq | INTRAVENOUS | Status: DC
  Filled 2013-10-29 (×4): qty 100

## 2013-10-29 NOTE — Progress Notes (Signed)
  Date: 10/29/2013  Patient name: [name]  Medical record number: [id number]  Date of birth: May 18, 1945   This patient has been seen and the plan of care was discussed with the house staff. Please see their note for complete details. I concur with their findings with the following additions/corrections: She is now off O2 and is not dyspneic nor DOE. She has started Xarelto and we explained the importance of not missing any doses. She is to get a CT scan tomorrow and possible D/C Monday 31st.   [name], MD 10/29/2013, 1:48 PM

## 2013-10-29 NOTE — Progress Notes (Signed)
Subjective: Patient is s/p USAT 8/27 for submassive bilateral PE with right heart strain. She states her DOE is much improved and is able to walk to the bathroom without symptoms. She does continue to c/o intermittent left sided "chest tightness" when using the restroom, she denies any of these symptoms today. She denies any bleeding or groin site pain.   Objective: Physical Exam: BP 135/75  Pulse 78  Temp(Src) 98.8 F (37.1 C) (Oral)  Resp 14  Ht 5' 7.5" (1.715 m)  Wt 330 lb 0.5 oz (149.7 kg)  BMI 50.90 kg/m2  SpO2 93%  General: A&Ox3, NAD, sitting in bed Lungs: CTA bilaterally.  Abd: Soft, NT, ND Ext: RCFV access site dressing C/D/I (removed today), soft, NT, no signs of bleeding or hematoma. DP 1+ bilaterally intact.   Labs: CBC  Recent Labs  10/28/13 0230 10/28/13 1630  WBC 7.0 7.0  HGB 12.7 13.1  HCT 37.5 39.4  PLT 146* 145*   BMET  Recent Labs  10/27/13 0350 10/29/13 0430  NA 141 137  K 3.2* 3.2*  CL 100 101  CO2 24 22  GLUCOSE 102* 104*  BUN 11 7  CREATININE 0.92 0.93  CALCIUM 8.9 8.9   LFT  Recent Labs  10/27/13 0350  PROT 7.2  ALBUMIN 3.4*  AST 19  ALT 14  ALKPHOS 90  BILITOT 0.4   PT/INR  Recent Labs  10/28/13 0230  LABPROT 15.3*  INR 1.21     Studies/Results: Ir Angiogram Pulmonary Bilateral Selective  10/27/2013   CLINICAL DATA:  68 year old female with large volume bilateral acute pulmonary emboli and evidence of right heart strain both by CT and echocardiography consistent with sub massive PE. She presents for initiation of bilateral ultrasound accelerated catheter directed thrombolysis (USAT).  EXAM: IR INFUSION THROMBOL ARTERIAL INITIAL (MS); IR ULTRASOUND GUIDANCE VASC ACCESS RIGHT; ADDITIONAL ARTERIOGRAPHY; BILATERAL PULMONARY ARTERIOGRAPHY  Date: 10/27/2013  PROCEDURE: 1. Ultrasound-guided puncture of the right common femoral vein 2. Second ultrasound-guided puncture of the right common femoral vein 3. Catheterization of the  right main pulmonary artery with limited arteriogram 4. Catheterization of the right lower lobe pulmonary artery with arteriogram 5. Catheterization of the left main pulmonary artery with arteriogram 6. Catheterization of the left lower lobe pulmonary artery with arteriogram 7. Placement of bilateral 12 cm EKOS infusion catheters 8. Initiation of pulmonary arterial lysis Interventional Radiologist:  Sterling Big, MD  ANESTHESIA/SEDATION: 100 mcg fentanyl administered intravenously  Total Moderate Sedation Time  45 minutes minutes.  FLUOROSCOPY TIME:  13 minutes 42 seconds.  CONTRAST:  15mL OMNIPAQUE IOHEXOL 300 MG/ML  SOLN  TECHNIQUE: Informed consent was obtained from the patient following explanation of the procedure, risks, benefits and alternatives. The patient understands, agrees and consents for the procedure. All questions were addressed. A time out was performed.  Maximal barrier sterile technique utilized including caps, mask, sterile gowns, sterile gloves, large sterile drape, hand hygiene, and Betadine skin prep.  The right groin was interrogated with ultrasound. The right common femoral vein is patent and compressible. No evidence of a DVT. Image was obtained and stored for the medical record. Local anesthesia was attained by infiltration with 1% lidocaine. A small dermatotomy was made. Under real-time sonographic guidance, a 21 gauge micropuncture needle was used to puncture the common femoral vein. With the aid of a 5 [place] transitional micro sheath the 0.018 wire was exchanged for a Bentson wire and a 6 [place] vascular sheath was advanced into the vein.  Immediately above the  initial access, a second ultrasound-guided puncture the common femoral vein was obtained in the same manner. In similar fashion, a second 6 [place] vascular sheath was advanced into the right common femoral vein.  Using a C2 Cobra catheter and Bentson wire, the pulmonary arterial trunk was catheterized. Pressure  measurements were obtained. The main pulmonary arterial pressure is 70/30 (mean 49) mmHg.  The catheter was then advanced into the right main pulmonary artery. A right pulmonary arteriogram was performed. Thrombus is present within the right main pulmonary artery extending into the right upper lobe pulmonary artery. Additionally, there is a filling defect in the proximal left lower lobe pulmonary artery. Overall, there is very limited perfusion of both the right upper and lower lobe pulmonary arteries.  The catheter was navigated into the right lower lobe pulmonary artery in a second hand injection confirmed catheter location. A Rosen wire was advanced through the catheter. Through the second 6 [place] vascular sheath a 100 cm angled catheter and a stiff Glidewire were used to select the left main pulmonary artery. Pulmonary arteriography was performed. The filling defects are present within multiple segmental branches of the left lower lobe pulmonary artery. The catheter was then advanced into the left lower lobe pulmonary artery and a second pulmonary arteriogram was performed confirming catheter location. A second Rosen wire was positioned in the left lower lobe pulmonary artery.  12 cm length EKOS catheters were then advanced over the Rosen wires and positioned in both the right and left lower lobe pulmonary arteries. The catheters extend into the main pulmonary arteries bilaterally.  The catheters were flushed and bilateral USAT was initiated with 1 mg/hr TPA infused into each catheter.  COMPLICATIONS: None immediate  IMPRESSION: 1. Right worse than left bilateral sub massive pulmonary emboli. 2. Critical pulmonary arterial hypertension. The main pulmonary arterial mean pressures 49 mm Hg. 3. Successful initiation of bilateral ultrasound-accelerated pulmonary arterial thrombolysis. Signed,  Sterling Big, MD  [facility]  [facility]   Electronically Signed    By: Malachy Moan M.D.   On: 10/27/2013 16:08   Ir Angiogram Selective Each Additional Vessel  10/27/2013   CLINICAL DATA:  68 year old female with large volume bilateral acute pulmonary emboli and evidence of right heart strain both by CT and echocardiography consistent with sub massive PE. She presents for initiation of bilateral ultrasound accelerated catheter directed thrombolysis (USAT).  EXAM: IR INFUSION THROMBOL ARTERIAL INITIAL (MS); IR ULTRASOUND GUIDANCE VASC ACCESS RIGHT; ADDITIONAL ARTERIOGRAPHY; BILATERAL PULMONARY ARTERIOGRAPHY  Date: 10/27/2013  PROCEDURE: 1. Ultrasound-guided puncture of the right common femoral vein 2. Second ultrasound-guided puncture of the right common femoral vein 3. Catheterization of the right main pulmonary artery with limited arteriogram 4. Catheterization of the right lower lobe pulmonary artery with arteriogram 5. Catheterization of the left main pulmonary artery with arteriogram 6. Catheterization of the left lower lobe pulmonary artery with arteriogram 7. Placement of bilateral 12 cm EKOS infusion catheters 8. Initiation of pulmonary arterial lysis Interventional Radiologist:  Sterling Big, MD  ANESTHESIA/SEDATION: 100 mcg fentanyl administered intravenously  Total Moderate Sedation Time  45 minutes minutes.  FLUOROSCOPY TIME:  13 minutes 42 seconds.  CONTRAST:  15mL OMNIPAQUE IOHEXOL 300 MG/ML  SOLN  TECHNIQUE: Informed consent was obtained from the patient following explanation of the procedure, risks, benefits and alternatives. The patient understands, agrees and consents for the procedure. All questions were addressed. A time out was performed.  Maximal barrier sterile technique utilized including caps, mask, sterile gowns,  sterile gloves, large sterile drape, hand hygiene, and Betadine skin prep.  The right groin was interrogated with ultrasound. The right common femoral vein is patent and compressible. No evidence of a DVT. Image was obtained and  stored for the medical record. Local anesthesia was attained by infiltration with 1% lidocaine. A small dermatotomy was made. Under real-time sonographic guidance, a 21 gauge micropuncture needle was used to puncture the common femoral vein. With the aid of a 5 [place] transitional micro sheath the 0.018 wire was exchanged for a Bentson wire and a 6 [place] vascular sheath was advanced into the vein.  Immediately above the initial access, a second ultrasound-guided puncture the common femoral vein was obtained in the same manner. In similar fashion, a second 6 [place] vascular sheath was advanced into the right common femoral vein.  Using a C2 Cobra catheter and Bentson wire, the pulmonary arterial trunk was catheterized. Pressure measurements were obtained. The main pulmonary arterial pressure is 70/30 (mean 49) mmHg.  The catheter was then advanced into the right main pulmonary artery. A right pulmonary arteriogram was performed. Thrombus is present within the right main pulmonary artery extending into the right upper lobe pulmonary artery. Additionally, there is a filling defect in the proximal left lower lobe pulmonary artery. Overall, there is very limited perfusion of both the right upper and lower lobe pulmonary arteries.  The catheter was navigated into the right lower lobe pulmonary artery in a second hand injection confirmed catheter location. A Rosen wire was advanced through the catheter. Through the second 6 [place] vascular sheath a 100 cm angled catheter and a stiff Glidewire were used to select the left main pulmonary artery. Pulmonary arteriography was performed. The filling defects are present within multiple segmental branches of the left lower lobe pulmonary artery. The catheter was then advanced into the left lower lobe pulmonary artery and a second pulmonary arteriogram was performed confirming catheter location. A second Rosen wire was positioned in the left lower lobe pulmonary artery.  12 cm  length EKOS catheters were then advanced over the Rosen wires and positioned in both the right and left lower lobe pulmonary arteries. The catheters extend into the main pulmonary arteries bilaterally.  The catheters were flushed and bilateral USAT was initiated with 1 mg/hr TPA infused into each catheter.  COMPLICATIONS: None immediate  IMPRESSION: 1. Right worse than left bilateral sub massive pulmonary emboli. 2. Critical pulmonary arterial hypertension. The main pulmonary arterial mean pressures 49 mm Hg. 3. Successful initiation of bilateral ultrasound-accelerated pulmonary arterial thrombolysis. Signed,  Sterling Big, MD  [facility]  [facility]   Electronically Signed   By: Malachy Moan M.D.   On: 10/27/2013 16:08   Ir Angiogram Selective Each Additional Vessel  10/27/2013   CLINICAL DATA:  68 year old female with large volume bilateral acute pulmonary emboli and evidence of right heart strain both by CT and echocardiography consistent with sub massive PE. She presents for initiation of bilateral ultrasound accelerated catheter directed thrombolysis (USAT).  EXAM: IR INFUSION THROMBOL ARTERIAL INITIAL (MS); IR ULTRASOUND GUIDANCE VASC ACCESS RIGHT; ADDITIONAL ARTERIOGRAPHY; BILATERAL PULMONARY ARTERIOGRAPHY  Date: 10/27/2013  PROCEDURE: 1. Ultrasound-guided puncture of the right common femoral vein 2. Second ultrasound-guided puncture of the right common femoral vein 3. Catheterization of the right main pulmonary artery with limited arteriogram 4. Catheterization of the right lower lobe pulmonary artery with arteriogram 5. Catheterization of the left main pulmonary artery with arteriogram 6. Catheterization of the left lower lobe  pulmonary artery with arteriogram 7. Placement of bilateral 12 cm EKOS infusion catheters 8. Initiation of pulmonary arterial lysis Interventional Radiologist:  Sterling Big, MD  ANESTHESIA/SEDATION: 100 mcg  fentanyl administered intravenously  Total Moderate Sedation Time  45 minutes minutes.  FLUOROSCOPY TIME:  13 minutes 42 seconds.  CONTRAST:  15mL OMNIPAQUE IOHEXOL 300 MG/ML  SOLN  TECHNIQUE: Informed consent was obtained from the patient following explanation of the procedure, risks, benefits and alternatives. The patient understands, agrees and consents for the procedure. All questions were addressed. A time out was performed.  Maximal barrier sterile technique utilized including caps, mask, sterile gowns, sterile gloves, large sterile drape, hand hygiene, and Betadine skin prep.  The right groin was interrogated with ultrasound. The right common femoral vein is patent and compressible. No evidence of a DVT. Image was obtained and stored for the medical record. Local anesthesia was attained by infiltration with 1% lidocaine. A small dermatotomy was made. Under real-time sonographic guidance, a 21 gauge micropuncture needle was used to puncture the common femoral vein. With the aid of a 5 [place] transitional micro sheath the 0.018 wire was exchanged for a Bentson wire and a 6 [place] vascular sheath was advanced into the vein.  Immediately above the initial access, a second ultrasound-guided puncture the common femoral vein was obtained in the same manner. In similar fashion, a second 6 [place] vascular sheath was advanced into the right common femoral vein.  Using a C2 Cobra catheter and Bentson wire, the pulmonary arterial trunk was catheterized. Pressure measurements were obtained. The main pulmonary arterial pressure is 70/30 (mean 49) mmHg.  The catheter was then advanced into the right main pulmonary artery. A right pulmonary arteriogram was performed. Thrombus is present within the right main pulmonary artery extending into the right upper lobe pulmonary artery. Additionally, there is a filling defect in the proximal left lower lobe pulmonary artery. Overall, there is very limited perfusion of both the right  upper and lower lobe pulmonary arteries.  The catheter was navigated into the right lower lobe pulmonary artery in a second hand injection confirmed catheter location. A Rosen wire was advanced through the catheter. Through the second 6 [place] vascular sheath a 100 cm angled catheter and a stiff Glidewire were used to select the left main pulmonary artery. Pulmonary arteriography was performed. The filling defects are present within multiple segmental branches of the left lower lobe pulmonary artery. The catheter was then advanced into the left lower lobe pulmonary artery and a second pulmonary arteriogram was performed confirming catheter location. A second Rosen wire was positioned in the left lower lobe pulmonary artery.  12 cm length EKOS catheters were then advanced over the Rosen wires and positioned in both the right and left lower lobe pulmonary arteries. The catheters extend into the main pulmonary arteries bilaterally.  The catheters were flushed and bilateral USAT was initiated with 1 mg/hr TPA infused into each catheter.  COMPLICATIONS: None immediate  IMPRESSION: 1. Right worse than left bilateral sub massive pulmonary emboli. 2. Critical pulmonary arterial hypertension. The main pulmonary arterial mean pressures 49 mm Hg. 3. Successful initiation of bilateral ultrasound-accelerated pulmonary arterial thrombolysis. Signed,  Sterling Big, MD  [facility]  [facility]   Electronically Signed   By: Malachy Moan M.D.   On: 10/27/2013 16:08   Ir US Guide Vasc Access Right  10/27/2013   CLINICAL DATA:  68 year old female with large volume bilateral acute pulmonary emboli and evidence of  right heart strain both by CT and echocardiography consistent with sub massive PE. She presents for initiation of bilateral ultrasound accelerated catheter directed thrombolysis (USAT).  EXAM: IR INFUSION THROMBOL ARTERIAL INITIAL (MS); IR ULTRASOUND GUIDANCE VASC  ACCESS RIGHT; ADDITIONAL ARTERIOGRAPHY; BILATERAL PULMONARY ARTERIOGRAPHY  Date: 10/27/2013  PROCEDURE: 1. Ultrasound-guided puncture of the right common femoral vein 2. Second ultrasound-guided puncture of the right common femoral vein 3. Catheterization of the right main pulmonary artery with limited arteriogram 4. Catheterization of the right lower lobe pulmonary artery with arteriogram 5. Catheterization of the left main pulmonary artery with arteriogram 6. Catheterization of the left lower lobe pulmonary artery with arteriogram 7. Placement of bilateral 12 cm EKOS infusion catheters 8. Initiation of pulmonary arterial lysis Interventional Radiologist:  Sterling Big, MD  ANESTHESIA/SEDATION: 100 mcg fentanyl administered intravenously  Total Moderate Sedation Time  45 minutes minutes.  FLUOROSCOPY TIME:  13 minutes 42 seconds.  CONTRAST:  15mL OMNIPAQUE IOHEXOL 300 MG/ML  SOLN  TECHNIQUE: Informed consent was obtained from the patient following explanation of the procedure, risks, benefits and alternatives. The patient understands, agrees and consents for the procedure. All questions were addressed. A time out was performed.  Maximal barrier sterile technique utilized including caps, mask, sterile gowns, sterile gloves, large sterile drape, hand hygiene, and Betadine skin prep.  The right groin was interrogated with ultrasound. The right common femoral vein is patent and compressible. No evidence of a DVT. Image was obtained and stored for the medical record. Local anesthesia was attained by infiltration with 1% lidocaine. A small dermatotomy was made. Under real-time sonographic guidance, a 21 gauge micropuncture needle was used to puncture the common femoral vein. With the aid of a 5 [place] transitional micro sheath the 0.018 wire was exchanged for a Bentson wire and a 6 [place] vascular sheath was advanced into the vein.  Immediately above the initial access, a second ultrasound-guided puncture the  common femoral vein was obtained in the same manner. In similar fashion, a second 6 [place] vascular sheath was advanced into the right common femoral vein.  Using a C2 Cobra catheter and Bentson wire, the pulmonary arterial trunk was catheterized. Pressure measurements were obtained. The main pulmonary arterial pressure is 70/30 (mean 49) mmHg.  The catheter was then advanced into the right main pulmonary artery. A right pulmonary arteriogram was performed. Thrombus is present within the right main pulmonary artery extending into the right upper lobe pulmonary artery. Additionally, there is a filling defect in the proximal left lower lobe pulmonary artery. Overall, there is very limited perfusion of both the right upper and lower lobe pulmonary arteries.  The catheter was navigated into the right lower lobe pulmonary artery in a second hand injection confirmed catheter location. A Rosen wire was advanced through the catheter. Through the second 6 [place] vascular sheath a 100 cm angled catheter and a stiff Glidewire were used to select the left main pulmonary artery. Pulmonary arteriography was performed. The filling defects are present within multiple segmental branches of the left lower lobe pulmonary artery. The catheter was then advanced into the left lower lobe pulmonary artery and a second pulmonary arteriogram was performed confirming catheter location. A second Rosen wire was positioned in the left lower lobe pulmonary artery.  12 cm length EKOS catheters were then advanced over the Rosen wires and positioned in both the right and left lower lobe pulmonary arteries. The catheters extend into the main pulmonary arteries bilaterally.  The catheters were flushed and bilateral USAT  was initiated with 1 mg/hr TPA infused into each catheter.  COMPLICATIONS: None immediate  IMPRESSION: 1. Right worse than left bilateral sub massive pulmonary emboli. 2. Critical pulmonary arterial hypertension. The main pulmonary  arterial mean pressures 49 mm Hg. 3. Successful initiation of bilateral ultrasound-accelerated pulmonary arterial thrombolysis. Signed,  Sterling Big, MD  [facility]  [facility]   Electronically Signed   By: Malachy Moan M.D.   On: 10/27/2013 16:08   Ir Infusion Thrombol Arterial Initial (ms)  10/27/2013   CLINICAL DATA:  68 year old female with large volume bilateral acute pulmonary emboli and evidence of right heart strain both by CT and echocardiography consistent with sub massive PE. She presents for initiation of bilateral ultrasound accelerated catheter directed thrombolysis (USAT).  EXAM: IR INFUSION THROMBOL ARTERIAL INITIAL (MS); IR ULTRASOUND GUIDANCE VASC ACCESS RIGHT; ADDITIONAL ARTERIOGRAPHY; BILATERAL PULMONARY ARTERIOGRAPHY  Date: 10/27/2013  PROCEDURE: 1. Ultrasound-guided puncture of the right common femoral vein 2. Second ultrasound-guided puncture of the right common femoral vein 3. Catheterization of the right main pulmonary artery with limited arteriogram 4. Catheterization of the right lower lobe pulmonary artery with arteriogram 5. Catheterization of the left main pulmonary artery with arteriogram 6. Catheterization of the left lower lobe pulmonary artery with arteriogram 7. Placement of bilateral 12 cm EKOS infusion catheters 8. Initiation of pulmonary arterial lysis Interventional Radiologist:  Sterling Big, MD  ANESTHESIA/SEDATION: 100 mcg fentanyl administered intravenously  Total Moderate Sedation Time  45 minutes minutes.  FLUOROSCOPY TIME:  13 minutes 42 seconds.  CONTRAST:  15mL OMNIPAQUE IOHEXOL 300 MG/ML  SOLN  TECHNIQUE: Informed consent was obtained from the patient following explanation of the procedure, risks, benefits and alternatives. The patient understands, agrees and consents for the procedure. All questions were addressed. A time out was performed.  Maximal barrier sterile technique utilized including  caps, mask, sterile gowns, sterile gloves, large sterile drape, hand hygiene, and Betadine skin prep.  The right groin was interrogated with ultrasound. The right common femoral vein is patent and compressible. No evidence of a DVT. Image was obtained and stored for the medical record. Local anesthesia was attained by infiltration with 1% lidocaine. A small dermatotomy was made. Under real-time sonographic guidance, a 21 gauge micropuncture needle was used to puncture the common femoral vein. With the aid of a 5 [place] transitional micro sheath the 0.018 wire was exchanged for a Bentson wire and a 6 [place] vascular sheath was advanced into the vein.  Immediately above the initial access, a second ultrasound-guided puncture the common femoral vein was obtained in the same manner. In similar fashion, a second 6 [place] vascular sheath was advanced into the right common femoral vein.  Using a C2 Cobra catheter and Bentson wire, the pulmonary arterial trunk was catheterized. Pressure measurements were obtained. The main pulmonary arterial pressure is 70/30 (mean 49) mmHg.  The catheter was then advanced into the right main pulmonary artery. A right pulmonary arteriogram was performed. Thrombus is present within the right main pulmonary artery extending into the right upper lobe pulmonary artery. Additionally, there is a filling defect in the proximal left lower lobe pulmonary artery. Overall, there is very limited perfusion of both the right upper and lower lobe pulmonary arteries.  The catheter was navigated into the right lower lobe pulmonary artery in a second hand injection confirmed catheter location. A Rosen wire was advanced through the catheter. Through the second 6 [place] vascular sheath a 100 cm angled catheter and a stiff  Glidewire were used to select the left main pulmonary artery. Pulmonary arteriography was performed. The filling defects are present within multiple segmental branches of the left lower lobe  pulmonary artery. The catheter was then advanced into the left lower lobe pulmonary artery and a second pulmonary arteriogram was performed confirming catheter location. A second Rosen wire was positioned in the left lower lobe pulmonary artery.  12 cm length EKOS catheters were then advanced over the Rosen wires and positioned in both the right and left lower lobe pulmonary arteries. The catheters extend into the main pulmonary arteries bilaterally.  The catheters were flushed and bilateral USAT was initiated with 1 mg/hr TPA infused into each catheter.  COMPLICATIONS: None immediate  IMPRESSION: 1. Right worse than left bilateral sub massive pulmonary emboli. 2. Critical pulmonary arterial hypertension. The main pulmonary arterial mean pressures 49 mm Hg. 3. Successful initiation of bilateral ultrasound-accelerated pulmonary arterial thrombolysis. Signed,  Sterling Big, MD  [facility]  [facility]   Electronically Signed   By: Malachy Moan M.D.   On: 10/27/2013 16:08   Ir Rande Lawman F/u Eval Art/ven Final Day (ms)  10/28/2013   INDICATION: 68 year old female with history of large volume bilateral pulmonary embolism and evidence of right heart strain on both CT and echo cardial echocardiography consistent with sub massive pulmonary embolism. Post initiation of bilateral ultrasound accelerated catheter directed thrombolysis (USAT) on 10/27/2013. Patient presents today for follow-up examination.  EXAM: 1. ARTERIOGRAPHY OF THE MAIN PULMONARY ARTERY 2. FLUOROSCOPIC GUIDED EVALUATION OF PULMONARY ARTERIAL PRESSURES  COMPARISON:  Chest CTA - 09/26/2013  MEDICATIONS: None  CONTRAST:  30 cc OMNIPAQUE IOHEXOL 300 MG/ML  SOLN  FLUOROSCOPY TIME:  4 minutes.  COMPLICATIONS: None immediate  TECHNIQUE: Informed written consent was obtained from the patient after a discussion of the risks, benefits and alternatives to treatment. Questions regarding the procedure were  encouraged and answered. A timeout was performed prior to the initiation of the procedure.  Preprocedural spot fluoroscopic image was obtained.  The right pulmonary arterial infusion catheter was cannulated within exchange length Bentson wire and exchanged for a pigtail catheter. The pigtail catheter was utilized to obtain pulmonary arterial pressures at the level of the right pulmonary artery.  The left pulmonary arterial infusion catheter was then exchanged for the pigtail catheter and utilized to obtain pulmonary arterial pressures at the level of the left pulmonary artery appear the pigtail catheter was then retracted to the level of the main pulmonary artery. Limited main pulmonary arteriogram was performed to confirm appropriate positioning and pulmonary arterial pressure was acquired at the level of the main pulmonary artery.  At this point, the procedure was terminated. All wires catheters and sheaths were removed from the patient. Hemostasis was achieved at the right groin access site with manual compression. A dressing was placed. The patient tolerated the procedure well without immediate postprocedural complication.  FINDINGS: A preprocedural spot fluoroscopic image demonstrates unchanged positioning of the bilateral pulmonary arterial infusion catheters. Pressure measurements were obtained as detailed above. A limited main pulmonary arteriogram was performed to confirm appropriate positioning during the measurements of the level of the main pulmonary artery.  Acquired pulmonary arterial pressure measurements:  Right main pulmonary artery - 61/20 ; mean - 38  Left main pulmonary artery - 63/29 ; mean - 43  Main pulmonary artery - 62/13; mean - 36 (previously: 73/30; mean - 49).  IMPRESSION: Technically successful bilateral ultrasound accelerated pulmonary arterial thrombolysis with reduction of the mean main  pulmonary arterial pressure from 49 mm Hg to 36 mm Hg (13 mm Hg reduction in mean main pulmonary  arterial pressure).  PLAN: - Patient will continue on a heparin drip with event conversion to long-term anti coagulation as directed by PCCM.  - Will obtain follow-up chest CTA and cardiac echo approximately 48 hours after the beginning of the USAT (tomorrow afternoon).  Above findings discussed with Dr. Sung Amabile at the completion of the procedure.   Electronically Signed   By: Simonne Come M.D.   On: 10/28/2013 12:30    Assessment/Plan: Bilateral submassive pulmonary embolism with evidence of right heart strain by CT and echo S/p bilateral ultrasound accelerated thrombolysis (USAT) 8/27 with improved PA pressures on 8/28, symptoms improved, labs stable, no evidence of bleeding now on Xarelto, heparin discontinued.  History of PE in 2003, previously on coumadin.  Scheduled for CTA 8/30 Follow up with IR Dr. Archer Asa in clinic in 4 weeks, our office will contact patient [phone]    LOS: 3 days    Cloretta Ned 10/29/2013 10:57 AM

## 2013-10-29 NOTE — Progress Notes (Signed)
Subjective: No acute events overnight. She feels fine this AM having taken the Xarelto. She feels her dypnea is mostly resolved as she can tolerate ambulating from bed to bathroom.   Objective: Vital signs in last 24 hours: Filed Vitals:   10/28/13 1500 10/28/13 1602 10/28/13 2020 10/29/13 0514  BP: 133/64 123/77 121/64 135/75  Pulse: 81 81 81 78  Temp:  98.8 F (37.1 C) 99.3 F (37.4 C) 98.8 F (37.1 C)  TempSrc:  Oral Oral Oral  Resp: Height:      Weight:      SpO2: 98% 97% 93% 93%   Weight change:   Intake/Output Summary (Last 24 hours) at 10/29/13 1354 Last data filed at 10/29/13 0810  Gross per 24 hour  Intake  679.5 ml  Output    300 ml  Net  379.5 ml   General: sitting in bed, NAD HEENT: PERRL, EOMI, no scleral icterus Cardiac: RRR, no rubs, murmurs or gallops Pulm: clear to auscultation bilaterally, no wheezes, rales, or rhonchi Abd: soft, nontender, nondistended, BS present Ext: warm and well perfused, no pedal edema Neuro: responds to questions appropriately; moving all extremities freely   Lab Results: Basic Metabolic Panel:  Recent Labs Lab 10/27/13 0350 10/29/13 0430  NA 141 137  K 3.2* 3.2*  CL 100 101  CO2 24 22  GLUCOSE 102* 104*  BUN 11 7  CREATININE 0.92 0.93  CALCIUM 8.9 8.9    Medications: I have reviewed the patient's current medications. Scheduled Meds: . antiseptic oral rinse  7 mL Mouth Rinse BID  . buPROPion  100 mg Oral TID  . DULoxetine  60 mg Oral Daily  . pantoprazole  40 mg Oral Daily  . potassium chloride  40 mEq Oral Once  . Rivaroxaban  15 mg Oral BID WC  . simvastatin  20 mg Oral q1800   Continuous Infusions: . sodium chloride     PRN Meds:.acetaminophen, acetaminophen, ALPRAZolam, HYDROcodone-acetaminophen, ondansetron (ZOFRAN) IV, ondansetron Assessment/Plan:  [name] is a 68 year old female with h/o PE s/p coumadin x 6 months, OSA, pre-diabetes, CKD stage 2, HTN hospitalized for bilateral  submassive PE s/p USAT (8/27) now with persistent hypokalemia.   #Bilateral submassive PE s/p USAT (8/27): She tolerated the procedure well and has been transitioned to Xarelto from heparin gtt.  -Advised her of the need for adherence to treatment -Scheduled for CTA tomorrow -Recheck CBC tomorrow -Awaiting hypercoagulable panel results: lupus anticoagulant panel, B2glycoprotein abs, homocysteine, factor 5 leiden, prothrombin gene mutation, cardiolipin antibody   #Hypokalemia: K 3.2 this AM, unchanged from 8/27. She is now tolerating PO intake.   -Replete with KCl PO -Recheck BMET & Mg in AM  #Depression: Stable. -Continue Wellbutrin SR  TID  -Continue Cymbalta    #OSA: She is not requiring O2 at baseline. Pulmonology has agreed to arrange for outpatient follow-up.   #HTN: Stable.  -Continue amlodipine  daily  -Continue HCTZ  daily   #GERD: Continue Protonix .   #HLD: Continue simvastatin .   #FEN:  -Diet: carb modified   #DVT prophylaxis: Xarelto   #CODE STATUS: FULL CODE   Dispo: Disposition is deferred at this time, awaiting improvement of current medical problems.  Anticipated discharge in approximately 1-2 day(s).   The patient does have a current PCP Patricia Spain, MD) and does need an [facility] hospital follow-up appointment after discharge.  The patient does not have transportation limitations that hinder transportation to clinic appointments.  [name]  Services Needed at time of discharge: Y = Yes, Blank = No PT:   OT:   RN:   Equipment:   Other:     LOS: 3 days   Heywood Iles, MD 10/29/2013, 1:54 PM

## 2013-10-30 ENCOUNTER — Other Ambulatory Visit (HOSPITAL_COMMUNITY): Payer: Commercial Managed Care - HMO

## 2013-10-30 ENCOUNTER — Inpatient Hospital Stay (HOSPITAL_COMMUNITY): Payer: Medicare HMO

## 2013-10-30 ENCOUNTER — Encounter (HOSPITAL_COMMUNITY): Payer: Self-pay | Admitting: Radiology

## 2013-10-30 LAB — FACTOR 5 LEIDEN

## 2013-10-30 LAB — CBC
HEMATOCRIT: 39.6 % (ref 36.0–46.0)
Hemoglobin: 12.9 g/dL (ref 12.0–15.0)
MCH: 26.5 pg (ref 26.0–34.0)
MCHC: 32.6 g/dL (ref 30.0–36.0)
MCV: 81.3 fL (ref 78.0–100.0)
Platelets: 152 10*3/uL (ref 150–400)
RBC: 4.87 MIL/uL (ref 3.87–5.11)
RDW: 14.7 % (ref 11.5–15.5)
WBC: 5.7 10*3/uL (ref 4.0–10.5)

## 2013-10-30 LAB — BASIC METABOLIC PANEL
Anion gap: 12 (ref 5–15)
BUN: 11 mg/dL (ref 6–23)
CALCIUM: 9.1 mg/dL (ref 8.4–10.5)
CO2: 23 meq/L (ref 19–32)
Chloride: 101 mEq/L (ref 96–112)
Creatinine, Ser: 0.94 mg/dL (ref 0.50–1.10)
GFR calc Af Amer: 72 mL/min — ABNORMAL LOW (ref >90)
GFR calc non Af Amer: 62 mL/min — ABNORMAL LOW (ref >90)
GLUCOSE: 103 mg/dL — AB (ref 70–99)
Potassium: 3.9 mEq/L (ref 3.7–5.3)
Sodium: 136 mEq/L — ABNORMAL LOW (ref 137–147)

## 2013-10-30 LAB — MAGNESIUM: MAGNESIUM: 1.9 mg/dL (ref 1.5–2.5)

## 2013-10-30 LAB — PROTHROMBIN GENE MUTATION

## 2013-10-30 MED ORDER — IOHEXOL 350 MG/ML SOLN
80.0000 mL | Freq: Once | INTRAVENOUS | Status: AC | PRN
  Administered 2013-10-30: 80 mL via INTRAVENOUS

## 2013-10-30 NOTE — Discharge Instructions (Addendum)
Thank you for trusting [place] with your medical care!  You were hospitalized for pulmonary embolism and treated with anticoagulants and careful monitoring.  Please continue taking Xarelto per the instructions listed below.  Information on my medicine - XARELTO (rivaroxaban)  This medication education was reviewed with me or my healthcare representative as part of my discharge preparation.  The pharmacist that spoke with me during my hospital stay was:  Juliette Christine Virginia Crews, [facility]  WHY WAS Carlena Hurl PRESCRIBED FOR YOU? Xarelto was prescribed to treat blood clots that may have been found in the veins of your legs (deep vein thrombosis) or in your lungs (pulmonary embolism) and to reduce the risk of them occurring again.  What do you need to know about Xarelto? The starting dose is one 15 mg tablet taken TWICE daily with food for the FIRST 21 DAYS then  the dose is changed to one 20 mg tablet taken ONCE A DAY with your evening meal starting 11/19/13.  DO NOT stop taking Xarelto without talking to the health care provider who prescribed the medication.  Refill your prescription for 20 mg tablets before you run out.  After discharge, you should have regular check-up appointments with your healthcare provider that is prescribing your Xarelto.  In the future your dose may need to be changed if your kidney function changes by a significant amount.  What do you do if you miss a dose? If you are taking Xarelto TWICE DAILY and you miss a dose, take it as soon as you remember. You may take two 15 mg tablets (total 30 mg) at the same time then resume your regularly scheduled 15 mg twice daily the next day.  If you are taking Xarelto ONCE DAILY and you miss a dose, take it as soon as you remember on the same day then continue your regularly scheduled once daily regimen the next day. Do not take two doses of Xarelto at the same time.   Important Safety Information Xarelto is a blood thinner  medicine that can cause bleeding. You should call your healthcare provider right away if you experience any of the following:   Bleeding from an injury or your nose that does not stop.   Unusual colored urine (red or dark brown) or unusual colored stools (red or black).   Unusual bruising for unknown reasons.   A serious fall or if you hit your head (even if there is no bleeding).  Some medicines may interact with Xarelto and might increase your risk of bleeding while on Xarelto. To help avoid this, consult your healthcare provider or pharmacist prior to using any new prescription or non-prescription medications, including herbals, vitamins, non-steroidal anti-inflammatory drugs (NSAIDs) and supplements.  This website has more information on Xarelto: VisitDestination.com.br.

## 2013-10-30 NOTE — Progress Notes (Signed)
Subjective: No overnight events. She continues to tolerate a diet well. Her DOE is resolved and she is maintaining an oxygen saturation of 100% on RA.   Objective: Vital signs in last 24 hours: Filed Vitals:   10/29/13 0514 10/29/13 1404 10/29/13 2109 10/30/13 0522  BP: 135/75 115/67 112/87 124/67  Pulse: 78 78 95 81  Temp: 98.8 F (37.1 C) 98.7 F (37.1 C) 99.1 F (37.3 C) 98.6 F (37 C)  TempSrc: Oral Oral Oral Oral  Resp: Height:      Weight:      SpO2: 93% 94% 95% 100%   Weight change:   Intake/Output Summary (Last 24 hours) at 10/30/13 1028 Last data filed at 10/29/13 1300  Gross per 24 hour  Intake    360 ml  Output      0 ml  Net    360 ml  Vitals Reviewed General: sitting in bed, in NAD  HEENT: no scleral icterus  Cardiac: RRR, no rubs, murmurs or gallops  Pulm: clear to auscultation bilaterally, no wheezes, rales, or rhonchi  Abd: soft, nontender, nondistended, BS present  Ext: warm and well perfused, no pedal edema  Neuro: responds to questions appropriately; moving all extremities voluntairly  Lab Results: Basic Metabolic Panel:  Recent Labs Lab 10/29/13 0430 10/30/13 0545  NA 137 136*  K 3.2* 3.9  CL 101 101  CO2 22 23  GLUCOSE 104* 103*  BUN 7 11  CREATININE 0.93 0.94  CALCIUM 8.9 9.1  MG  --  1.9   Liver Function Tests:  Recent Labs Lab 10/27/13 0350  AST 19  ALT 14  ALKPHOS 90  BILITOT 0.4  PROT 7.2  ALBUMIN 3.4*   CBC:  Recent Labs Lab 10/28/13 1630 10/30/13 0545  WBC 7.0 5.7  HGB 13.1 12.9  HCT 39.4 39.6  MCV 79.1 81.3  PLT 145* 152   Cardiac Enzymes:  Recent Labs Lab 10/26/13 2304 10/27/13 0350  TROPONINI <0.30 <0.30   BNP:  Recent Labs Lab 10/26/13 1339  PROBNP 1894.0*  Coagulation:  Recent Labs Lab 10/28/13 0230  LABPROT 15.3*  INR 1.21   Urinalysis:  Recent Labs Lab 10/27/13 1828  COLORURINE YELLOW  LABSPEC 1.020  PHURINE 5.5  GLUCOSEU NEGATIVE  HGBUR NEGATIVE    BILIRUBINUR SMALL*  KETONESUR NEGATIVE  PROTEINUR NEGATIVE  UROBILINOGEN 1.0  NITRITE NEGATIVE  LEUKOCYTESUR TRACE*    Micro Results: Recent Results (from the past 240 hour(s))  MRSA PCR SCREENING     Status: None   Collection Time    10/26/13  8:18 PM      Result Value Ref Range Status   MRSA by PCR NEGATIVE  NEGATIVE Final   Comment:            The GeneXpert MRSA Assay (FDA     approved for NASAL specimens     only), is one component of a     comprehensive MRSA colonization     surveillance program. It is not     intended to diagnose MRSA     infection nor to guide or     monitor treatment for     MRSA infections.   Medications: I have reviewed the patient's current medications. Scheduled Meds: . antiseptic oral rinse  7 mL Mouth Rinse BID  . buPROPion  100 mg Oral TID  . DULoxetine  60 mg Oral Daily  . pantoprazole  40 mg Oral Daily  . Rivaroxaban  15 mg  Oral BID WC  . simvastatin  20 mg Oral q1800   Continuous Infusions: . sodium chloride     PRN Meds:.acetaminophen, acetaminophen, ALPRAZolam, HYDROcodone-acetaminophen, ondansetron (ZOFRAN) IV, ondansetron Assessment/Plan: Ms. Chadderdon is a 68 year old woman with h/o previous PE s/p coumadin x 6 months, OSA, pre-diabetes, CKD stage 2, HTN hospitalized for bilateral submassive PE s/p USAT (8/27) now with persistent hypokalemia.  #Bilateral submassive PE s/p USAT (8/27): She is off heparin drip and continues to tolerated Xarelto well with no dyspnea today.   -Pulmonology following, appreciate recs -Repeat CTA today pending read -Awaiting hypercoagulable panel results: lupus anticoagulant panel, B2glycoprotein abs, homocysteine, factor 5 leiden, prothrombin gene mutation, cardiolipin antibody  -continue Xarelto  #Hypokalemia: resolved after Kdur repletion.   #Depression: Stable.  -Continue home Wellbutrin SR  TID  -Continue home Cymbalta    #OSA: She is not requiring O2 at baseline. Pulmonology has  agreed to arrange for outpatient follow-up.   #HTN: Stable.  -Continue home amlodipine  daily  -Continue home HCTZ  daily   #GERD: Continue Protonix .   #HLD: Continue simvastatin .   #FEN:  -Diet: carb modified   #DVT prophylaxis: Xarelto    #CODE STATUS: FULL CODE   Dispo: Disposition is deferred at this time, awaiting improvement of current medical problems.  Anticipated discharge in approximately 1-2 day(s).   The patient does have a current PCP [name], MD) and does need an [facility] hospital follow-up appointment after discharge.  The patient does not have transportation limitations that hinder transportation to clinic appointments.  .Services Needed at time of discharge: Y = Yes, Blank = No PT:   OT:   RN:   Equipment:   Other:     LOS: 4 days   Ky Barban, MD 10/30/2013, 10:28 AM

## 2013-10-31 ENCOUNTER — Telehealth: Payer: Self-pay | Admitting: Internal Medicine

## 2013-10-31 ENCOUNTER — Telehealth: Payer: Self-pay | Admitting: Critical Care Medicine

## 2013-10-31 DIAGNOSIS — N183 Chronic kidney disease, stage 3 unspecified: Secondary | ICD-10-CM

## 2013-10-31 DIAGNOSIS — R7309 Other abnormal glucose: Secondary | ICD-10-CM

## 2013-10-31 LAB — CBC
HCT: 39.5 % (ref 36.0–46.0)
Hemoglobin: 12.7 g/dL (ref 12.0–15.0)
MCH: 26.1 pg (ref 26.0–34.0)
MCHC: 32.2 g/dL (ref 30.0–36.0)
MCV: 81.3 fL (ref 78.0–100.0)
PLATELETS: 175 10*3/uL (ref 150–400)
RBC: 4.86 MIL/uL (ref 3.87–5.11)
RDW: 14.6 % (ref 11.5–15.5)
WBC: 6.4 10*3/uL (ref 4.0–10.5)

## 2013-10-31 LAB — BASIC METABOLIC PANEL
Anion gap: 14 (ref 5–15)
BUN: 12 mg/dL (ref 6–23)
CALCIUM: 8.9 mg/dL (ref 8.4–10.5)
CO2: 23 mEq/L (ref 19–32)
CREATININE: 0.98 mg/dL (ref 0.50–1.10)
Chloride: 102 mEq/L (ref 96–112)
GFR calc Af Amer: 68 mL/min — ABNORMAL LOW (ref >90)
GFR, EST NON AFRICAN AMERICAN: 59 mL/min — AB (ref >90)
Glucose, Bld: 101 mg/dL — ABNORMAL HIGH (ref 70–99)
Potassium: 3.9 mEq/L (ref 3.7–5.3)
Sodium: 139 mEq/L (ref 137–147)

## 2013-10-31 MED ORDER — RIVAROXABAN 15 MG PO TABS: 15.0000 mg | ORAL_TABLET | Freq: Two times a day (BID) | ORAL | Status: DC

## 2013-10-31 MED ORDER — RIVAROXABAN 20 MG PO TABS: 20.0000 mg | ORAL_TABLET | Freq: Every day | ORAL | Status: DC

## 2013-10-31 NOTE — Telephone Encounter (Signed)
Spoke with pharmacy. Xarelto rx was not given to pt at d/c.   [phone] called in Xarelto 15 mg bid until 9/19 #39 no refills then she will start 20 mg qd on 11/20/13 #30 refills to be provided at f/u   [facility] MD

## 2013-10-31 NOTE — Progress Notes (Signed)
Discharge instructions given. Pt verbalized understanding and all questions were answered.  

## 2013-10-31 NOTE — Progress Notes (Signed)
Subjective: CTA yesterday showed improved RV/LV ratio (1.4->0.8) with some clot burden present. No overnight events. She has no complaints this AM and knows to take her Xarelto consistently.   Objective: Vital signs in last 24 hours: Filed Vitals:   10/30/13 0522 10/30/13 1330 10/30/13 2012 10/31/13 0501  BP: 124/67 121/69 129/66 128/77  Pulse: 81 73 76 75  Temp: 98.6 F (37 C) 98.7 F (37.1 C) 98.2 F (36.8 C) 98 F (36.7 C)  TempSrc: Oral Oral Oral Oral  Resp: Height:      Weight:      SpO2: 100% 96% 100% 95%   Weight change:  No intake or output data in the 24 hours ending 10/31/13 1311 General: sitting in her chair   HEENT: no scleral icterus  Cardiac: RRR, no rubs, murmurs or gallops  Pulm: clear to auscultation bilaterally, no wheezes, rales, or rhonchi  Abd: soft, nontender, nondistended, BS present  Ext: warm and well perfused, no pedal edema  Neuro: responds to questions appropriately; moving all extremities voluntarily  Lab Results: Basic Metabolic Panel:  Recent Labs Lab 10/30/13 0545 10/31/13 0048  NA 136* 139  K 3.9 3.9  CL 101 102  CO2 23 23  GLUCOSE 103* 101*  BUN 11 12  CREATININE 0.94 0.98  CALCIUM 9.1 8.9  MG 1.9  --     CBC:  Recent Labs Lab 10/30/13 0545 10/31/13 0048  WBC 5.7 6.4  HGB 12.9 12.7  HCT 39.6 39.5  MCV 81.3 81.3  PLT 152 175   Medications: I have reviewed the patient's current medications. Scheduled Meds: . antiseptic oral rinse  7 mL Mouth Rinse BID  . buPROPion  100 mg Oral TID  . DULoxetine  60 mg Oral Daily  . pantoprazole  40 mg Oral Daily  . Rivaroxaban  15 mg Oral BID WC  . simvastatin  20 mg Oral q1800   Continuous Infusions: . sodium chloride     PRN Meds:.acetaminophen, acetaminophen, ALPRAZolam, HYDROcodone-acetaminophen, ondansetron (ZOFRAN) IV, ondansetron Assessment/Plan: [name] is a 68 year old woman with h/o previous PE s/p coumadin x 6 months, OSA, pre-diabetes, CKD stage  2, HTN hospitalized for bilateral submassive PE s/p USAT (8/27) stable for discharge today.  #Bilateral submassive PE s/p USAT (8/27): She has tolerated Xarelto well and understands the importance of adherence to treatment. -Pulmonology has scheduled follow-up -Scheduled follow-up with PCP as well -Continue Xarelto per instructions given to her -Awaiting hypercoagulable panel results: lupus anticoagulant panel, B2glycoprotein abs, homocysteine, factor 5 leiden, prothrombin gene mutation, cardiolipin antibody   #Depression: Stable.  -Continue home Wellbutrin SR  TID  -Continue home Cymbalta    #OSA: Pulmonology has agreed to arrange for outpatient follow-up and can discuss with her there.  #HTN: Stable.  -Continue home amlodipine  daily  -Continue home HCTZ  daily   #GERD: Continue Protonix .   #HLD: Continue simvastatin .   #FEN:  -Diet: carb modified   #DVT prophylaxis: Xarelto    #CODE STATUS: FULL CODE   Dispo: Disposition is deferred at this time, awaiting improvement of current medical problems.  Anticipated discharge in approximately 1 day.   The patient does have a current PCP Patricia Spain, MD) and does need an [facility] hospital follow-up appointment after discharge.  The patient does not have transportation limitations that hinder transportation to clinic appointments.  .Services Needed at time of discharge: Y = Yes, Blank = No PT:   OT:   RN:  Equipment:   Other:     LOS: 5 days   Heywood Iles, MD 10/31/2013, 1:11 PM

## 2013-10-31 NOTE — Telephone Encounter (Signed)
pls call to insure pt got d/c meds  Got a phone call said no meds given at d/c by [facility]  There is a note they called in the xarelto  Concerned pt did not get d/c to home with xarelto

## 2013-10-31 NOTE — Progress Notes (Signed)
[facility]   BRIEF PATIENT DESCRIPTION:  68 yo female presented with 6 days of palpitations, dyspnea on exertion. Found to have b/l PE with Rt heart strain on CT chest. Has hx of PE in 2003. PCCM asked to assess for thrombolytic therapy.   SIGNIFICANT STUDIES:  8/26 CTA >>> extensive b/l PE's with evidence of right heart strain c/w at least submassive PE.  8/26 Echo >>> EF 55%, grade 1 diastolic dysfx, mod/severe RV dilation with mod RV systolic dysfx, PAS 51 mmHg  8/27 LE Dopplers >>>negative  8/27 Hypercoag panel >>>Lupus anticoagulant 148.5 (H), factor 5 Leiden, homocysteine, prothrombin all neg   Subjective/Overnight:  No sig change.  On RA. Denies SOB, chest pain.   Filed Vitals:   10/30/13 0522 10/30/13 1330 10/30/13 2012 10/31/13 0501  BP: 124/67 121/69 129/66 128/77  Pulse: 81 73 76 75  Temp: 98.6 F (37 C) 98.7 F (37.1 C) 98.2 F (36.8 C) 98 F (36.7 C)  TempSrc: Oral Oral Oral Oral  Resp: Height:      Weight:      SpO2: 100% 96% 100% 95%   Exam:  General: pleasant, obese female, NAD OOB in chair  Neuro: awake, alert, appropriate CV:  s1s2 rrr PULM: resps even non labored on RA  GI: obese, soft, non tender  Extremities: warm and dry, scant BLE edema    Recent Labs Lab 10/26/13 1339 10/27/13 0350 10/29/13 0430 10/30/13 0545 10/31/13 0048  NA 140 141 137 136* 139  K 4.1 3.2* 3.2* 3.9 3.9  CL 101 100 101 101 102  CO2 GLUCOSE 108* 102* 104* 103* 101*  BUN CREATININE 1.09 0.92 0.93 0.94 0.98  CALCIUM 9.6 8.9 8.9 9.1 8.9  MG  --   --   --  1.9  --     Recent Labs Lab 10/28/13 1630 10/30/13 0545 10/31/13 0048  HGB 13.1 12.9 12.7  HCT 39.4 39.6 39.5  WBC 7.0 5.7 6.4  PLT 145* 152 175   Ct Angio Chest Pe W/cm &/or Wo Cm  10/30/2013   CLINICAL DATA:  Pulmonary emboli.  Thrombolysis.  EXAM: CT ANGIOGRAPHY CHEST WITH CONTRAST  TECHNIQUE: Multidetector CT imaging of the chest was performed using the  standard protocol during bolus administration of intravenous contrast. Multiplanar CT image reconstructions and MIPs were obtained to evaluate the vascular anatomy.  CONTRAST:  80mL OMNIPAQUE IOHEXOL 350 MG/ML SOLN  COMPARISON:  CT 10/26/2013.  FINDINGS: Thoracic aorta is normal in caliber. No evidence of aneurysm or dissection. Great vessels are normal. Bilateral pulmonary emboli again noted. This has been significant reduction in clot burden from prior study. Right ventricular/ Left ventricular ratio is 0.8. This is significantly improved from prior study at which time the right ventricular/ left ventricular ratio was 1.4. Cardiomegaly.  Subcarinal and hilar lymph nodes noted. Small sliding hiatal hernia. Thoracic esophagus otherwise unremarkable.  Large airways are patent. Bibasilar atelectasis. 5 mm nodule left lung base difficult to visualize secondary to atelectasis. Nodule appears stable.  Prominent left upper quadrant cyst, most likely renal cyst again noted. Visualized upper abdominal structures otherwise unremarkable.  Thyroid unremarkable. No supraclavicular or axillary adenopathy noted. Diffuse degenerative change thoracic spine.  Review of the MIP images confirms the above findings.  IMPRESSION: 1. Bilateral pulmonary emboli with significant reduction in clot burden status post thrombolysis. Right ventricular /left ventricular ratio ileus 0.8, this is significantly improved. Prior right  ventricular/ left ventricular ratio was 1.4. 2. Stable 5 mm nodule left lung base. This is difficult to visualize due to basilar atelectasis.   Electronically Signed   By: Maisie Fus  Register   On: 10/30/2013 10:34     IMPRESSION: 1) Recurrent pulmonary embolism> submassive  2) Right heart strain by echo, elevated proBNP  3) Obstructive sleep apnea> untreated  4) Morbid obesity  PLAN/RECS: Plan for d/c home today per IMTS  Discussed need for CPAP - she has one at home and says she is motivated try it more   Outpt pulm f/u - appt made, in d/c section  rivaroxaban per IMTS Close PCP f/u Museum/gallery curator)   PCCM signing off please call back if needed    Dirk Dress, NP 10/31/2013  10:08 AM Pager: [phone] or [phone]  STaff MD Note   - she is feeling well and ready to go home. She agrees to take xarelto. SHe agrees to fu with Dr Kendrick Fries 11/18/13. PCCM will sign off. Rest per NP  Dr. Kalman Shan, M.D., [facility].C.P Pulmonary and Critical Care Medicine Staff Physician [facility] Pager: [phone], If no answer or between  15:00h - 7:00h: call [phone]  10/31/2013 10:51 AM

## 2013-10-31 NOTE — Telephone Encounter (Signed)
A, Please call her to make sure she has follow up with me and to make sure she has Xarelto at home. Thanks B

## 2013-10-31 NOTE — Evaluation (Signed)
Physical Therapy Evaluation Patient Details Name: [name] MRN: [id number] DOB: 08-25-1945 Today's Date: 10/31/2013   History of Present Illness  pt presents with Bil PEs and hx of PEs.  Clinical Impression  Pt moves well, only limited by SOB and generalized weakness.  Pt's O2 sats ranged 88-94% on RA during session.  Pt ed on importance of mobility and exercise at home.  Discussed walking program and following up with [facility] [facility] if cleared by MD.  Pt interested in talking to Dietitian prior to D/C.  No further acute PT needs at this time.  Will sign off.      Follow Up Recommendations No PT follow up    Equipment Recommendations  None recommended by PT    Recommendations for Other Services       Precautions / Restrictions Precautions Precautions: None Restrictions Weight Bearing Restrictions: No      Mobility  Bed Mobility Overal bed mobility: Modified Independent             General bed mobility comments: Needs increased time and utilizes HOB elevated.  pt has adjustable bed at home.    Transfers Overall transfer level: Modified independent Equipment used: None                Ambulation/Gait Ambulation/Gait assistance: Modified independent (Device/Increase time) Ambulation Distance (Feet): 100 Feet (x2) Assistive device: None Gait Pattern/deviations: Step-through pattern;Decreased stride length   Gait velocity interpretation: Below normal speed for age/gender General Gait Details: pt generally steady, only limited by SOB and fatigue.  pt's O2 stats 94% on RA at rest, 88% after first walk, seated rest break, and then 91% after 2nd walk.    Stairs            Wheelchair Mobility    Modified Rankin (Stroke Patients Only)       Balance Overall balance assessment: No apparent balance deficits (not formally assessed)                                           Pertinent Vitals/Pain Pain Assessment:  No/denies pain    Home Living Family/patient expects to be discharged to:: Private residence Living Arrangements: Alone Available Help at Discharge: Friend(s);Available PRN/intermittently Type of Home: House Home Access: Stairs to enter Entrance Stairs-Rails: None Entrance Stairs-Number of Steps: 1 Home Layout: One level Home Equipment: Environmental consultant - 2 wheels      Prior Function Level of Independence: Independent               Hand Dominance        Extremity/Trunk Assessment   Upper Extremity Assessment: Generalized weakness           Lower Extremity Assessment: Generalized weakness      Cervical / Trunk Assessment: Normal  Communication   Communication: No difficulties  Cognition Arousal/Alertness: Awake/alert Behavior During Therapy: WFL for tasks assessed/performed Overall Cognitive Status: Within Functional Limits for tasks assessed                      General Comments      Exercises Other Exercises Other Exercises: pt ed on walking program at home and general UE exercises.        Assessment/Plan    PT Assessment Patent does not need any further PT services  PT Diagnosis     PT Problem List  PT Treatment Interventions     PT Goals (Current goals can be found in the Care Plan section) Acute Rehab PT Goals PT Goal Formulation: No goals set, d/c therapy    Frequency     Barriers to discharge        Co-evaluation               End of Session   Activity Tolerance: Patient tolerated treatment well Patient left: in chair;with call bell/phone within reach Nurse Communication: Mobility status         Time: 7829-5621 PT Time Calculation (min): 32 min   Charges:   PT Evaluation $Initial PT Evaluation Tier I: 1 Procedure PT Treatments $Gait Training: 8-22 mins $Therapeutic Exercise: 8-22 mins   PT G CodesSunny Schlein, [place] [phone] 10/31/2013, 9:50 AM

## 2013-10-31 NOTE — Discharge Summary (Addendum)
Name: [name] MRN: [id number] DOB: 07/05/1945 68 y.o. PCP: [name], MD  Date of Admission: 10/26/2013  3:11 PM Date of Discharge: 10/31/2013 Attending Physician: [name], MD  Discharge Diagnosis: Principal Problem:   Bilateral pulmonary embolism Active Problems:   HYPERLIPIDEMIA   DEPRESSION   OBSTRUCTIVE SLEEP APNEA   HYPERTENSION   GERD   CKD (chronic kidney disease), stage II   Prediabetes  Discharge Medications:   Medication List         ALPRAZolam 1 MG tablet  Commonly known as:  XANAX  Take 1 mg by mouth at bedtime as needed for sleep. Take 1.5 tab by mouth everynight.     amLODipine 5 MG tablet  Commonly known as:  NORVASC  Take 5 mg by mouth daily.     buPROPion 100 MG tablet  Commonly known as:  WELLBUTRIN  Take 100 mg by mouth 3 (three) times daily.     DULoxetine 60 MG capsule  Commonly known as:  CYMBALTA  Take 60 mg by mouth daily.     hydrochlorothiazide 25 MG tablet  Commonly known as:  HYDRODIURIL  Take 1 tablet (25 mg total) by mouth daily.     multivitamin tablet  Take 1 tablet by mouth daily.     omeprazole 20 MG capsule  Commonly known as:  PRILOSEC  Take 1 capsule (20 mg total) by mouth daily.     potassium chloride 10 MEQ tablet  Commonly known as:  K-DUR  Take 1 tablet (10 mEq total) by mouth 2 (two) times daily.     pravastatin 40 MG tablet  Commonly known as:  PRAVACHOL  Take 1 tablet (40 mg total) by mouth daily.     Rivaroxaban 15 MG Tabs tablet  Commonly known as:  XARELTO  Take 1 tablet (15 mg total) by mouth 2 (two) times daily with a meal.        Disposition and follow-up:   [name] was discharged from [facility] in Stable condition.  At the hospital follow up visit please address:  1.  Anticoagulation: Xarelto use & dosing change on 9/19 (see summary)  2.  Pre-diabetes: diet & exercise  3.  Labs / imaging needed at time of follow-up:  none  4.  Pending labs/ test needing follow-up: hypercoaguable panel (lupus anticoagulant panel, B2glycoprotein Ab, homocysteine, factor 5 leiden, prothrombin gene mutation, cardiolipin antibody)  Follow-up Appointments: Follow-up Information   Follow up with Max Fickle, MD On 11/18/2013. (10:15am )    Specialty:  Pulmonary Disease   Contact information:   [address] [place] [address] [phone]       Follow up with Jill Alexanders, MD On 11/14/2013. [phone])    Specialty:  Internal Medicine   Contact information:   [address] [place] [address] [phone]       Discharge Instructions: Discharge Instructions   Call MD for:  difficulty breathing, headache or visual disturbances    Complete by:  As directed      Call MD for:  extreme fatigue    Complete by:  As directed      Call MD for:  temperature >100.4    Complete by:  As directed      Diet - low sodium heart healthy    Complete by:  As directed      Increase activity slowly    Complete by:  As directed  Consultations:    Procedures Performed:  Dg Chest 2 View  10/26/2013   CLINICAL DATA:  Difficulty breathing and chest tightness  EXAM: CHEST  2 VIEW  COMPARISON:  October 18, 2010  FINDINGS: There is no edema or consolidation. Heart is upper normal in size with pulmonary vascularity within normal limits. No adenopathy. There is degenerative change in the thoracic spine.  IMPRESSION: No edema or consolidation.   Electronically Signed   By: Bretta Bang M.D.   On: 10/26/2013 15:19   Ct Angio Chest Pe W/cm &/or Wo Cm  10/30/2013   CLINICAL DATA:  Pulmonary emboli.  Thrombolysis.  EXAM: CT ANGIOGRAPHY CHEST WITH CONTRAST  TECHNIQUE: Multidetector CT imaging of the chest was performed using the standard protocol during bolus administration of intravenous contrast. Multiplanar CT image reconstructions and MIPs were obtained to evaluate the vascular anatomy.  CONTRAST:  80mL OMNIPAQUE IOHEXOL  350 MG/ML SOLN  COMPARISON:  CT 10/26/2013.  FINDINGS: Thoracic aorta is normal in caliber. No evidence of aneurysm or dissection. Great vessels are normal. Bilateral pulmonary emboli again noted. This has been significant reduction in clot burden from prior study. Right ventricular/ Left ventricular ratio is 0.8. This is significantly improved from prior study at which time the right ventricular/ left ventricular ratio was 1.4. Cardiomegaly.  Subcarinal and hilar lymph nodes noted. Small sliding hiatal hernia. Thoracic esophagus otherwise unremarkable.  Large airways are patent. Bibasilar atelectasis. 5 mm nodule left lung base difficult to visualize secondary to atelectasis. Nodule appears stable.  Prominent left upper quadrant cyst, most likely renal cyst again noted. Visualized upper abdominal structures otherwise unremarkable.  Thyroid unremarkable. No supraclavicular or axillary adenopathy noted. Diffuse degenerative change thoracic spine.  Review of the MIP images confirms the above findings.  IMPRESSION: 1. Bilateral pulmonary emboli with significant reduction in clot burden status post thrombolysis. Right ventricular /left ventricular ratio ileus 0.8, this is significantly improved. Prior right ventricular/ left ventricular ratio was 1.4. 2. Stable 5 mm nodule left lung base. This is difficult to visualize due to basilar atelectasis.   Electronically Signed   By: Maisie Fus  Register   On: 10/30/2013 10:34   Ct Angio Chest Pe W/cm &/or Wo Cm  10/26/2013   CLINICAL DATA:  Shortness of breath.  EXAM: CT ANGIOGRAPHY CHEST WITH CONTRAST  TECHNIQUE: Multidetector CT imaging of the chest was performed using the standard protocol during bolus administration of intravenous contrast. Multiplanar CT image reconstructions and MIPs were obtained to evaluate the vascular anatomy.  CONTRAST:  100 mL OMNIPAQUE IOHEXOL 350 MG/ML SOLN  COMPARISON:  CT chest 04/24/2008. PA and lateral chest earlier this same day.  FINDINGS:  Bilateral pulmonary emboli are identified. A large embolus is seen the right main pulmonary artery. Saddle embolus at the bifurcation of the left main pulmonary artery is also seen with extensive clot in the descending interlobar arteries bilaterally and their branches. Clot in upper lobe arterial branches is also seen, better on the left. The RV to LV ratio is 1.41 consistent with right heart strain.  There is no pleural or pericardial effusion. Heart size is upper normal. No axillary, hilar or mediastinal lymphadenopathy is identified. The lungs demonstrate mild dependent atelectasis. No evidence of pulmonary infarct is seen. Previously seen 0.5 cm left lower lobe pulmonary nodule is somewhat difficult to visualize on this exam but is thought to be on image 60 and stable in size.  Incidentally imaged upper abdomen shows partial visualization of a large left renal cyst. Imaged intra-abdominal  contents are otherwise unremarkable. Thoracic spondylosis is noted. No lytic or sclerotic bony lesion is identified.  Review of the MIP images confirms the above findings.  IMPRESSION: The study is positive for extensive bilateral pulmonary embolus with evidence of right heart strain consistent with at least submassive PE. The presence of right heart strain has been associated with increased risk of morbidity and mortality. Consultation with Pulmonary and Critical Care Medicine is recommended.  0.5 cm left lower lobe pulmonary nodule is stable in size consistent with benignity.  Critical Value/emergent results were called by telephone at the time of interpretation on 10/26/2013 at 5:50 pm to [facility] who verbally acknowledged these results.   Electronically Signed   By: Drusilla Kanner M.D.   On: 10/26/2013 17:52   Ir Angiogram Pulmonary Bilateral Selective  10/27/2013   CLINICAL DATA:  68 year old female with large volume bilateral acute pulmonary emboli and evidence of right heart strain both by CT and  echocardiography consistent with sub massive PE. She presents for initiation of bilateral ultrasound accelerated catheter directed thrombolysis (USAT).  EXAM: IR INFUSION THROMBOL ARTERIAL INITIAL (MS); IR ULTRASOUND GUIDANCE VASC ACCESS RIGHT; ADDITIONAL ARTERIOGRAPHY; BILATERAL PULMONARY ARTERIOGRAPHY  Date: 10/27/2013  PROCEDURE: 1. Ultrasound-guided puncture of the right common femoral vein 2. Second ultrasound-guided puncture of the right common femoral vein 3. Catheterization of the right main pulmonary artery with limited arteriogram 4. Catheterization of the right lower lobe pulmonary artery with arteriogram 5. Catheterization of the left main pulmonary artery with arteriogram 6. Catheterization of the left lower lobe pulmonary artery with arteriogram 7. Placement of bilateral 12 cm EKOS infusion catheters 8. Initiation of pulmonary arterial lysis Interventional Radiologist:  Sterling Big, MD  ANESTHESIA/SEDATION: 100 mcg fentanyl administered intravenously  Total Moderate Sedation Time  45 minutes minutes.  FLUOROSCOPY TIME:  13 minutes 42 seconds.  CONTRAST:  15mL OMNIPAQUE IOHEXOL 300 MG/ML  SOLN  TECHNIQUE: Informed consent was obtained from the patient following explanation of the procedure, risks, benefits and alternatives. The patient understands, agrees and consents for the procedure. All questions were addressed. A time out was performed.  Maximal barrier sterile technique utilized including caps, mask, sterile gowns, sterile gloves, large sterile drape, hand hygiene, and Betadine skin prep.  The right groin was interrogated with ultrasound. The right common femoral vein is patent and compressible. No evidence of a DVT. Image was obtained and stored for the medical record. Local anesthesia was attained by infiltration with 1% lidocaine. A small dermatotomy was made. Under real-time sonographic guidance, a 21 gauge micropuncture needle was used to puncture the common femoral vein. With the aid  of a 5 [place] transitional micro sheath the 0.018 wire was exchanged for a Bentson wire and a 6 [place] vascular sheath was advanced into the vein.  Immediately above the initial access, a second ultrasound-guided puncture the common femoral vein was obtained in the same manner. In similar fashion, a second 6 [place] vascular sheath was advanced into the right common femoral vein.  Using a C2 Cobra catheter and Bentson wire, the pulmonary arterial trunk was catheterized. Pressure measurements were obtained. The main pulmonary arterial pressure is 70/30 (mean 49) mmHg.  The catheter was then advanced into the right main pulmonary artery. A right pulmonary arteriogram was performed. Thrombus is present within the right main pulmonary artery extending into the right upper lobe pulmonary artery. Additionally, there is a filling defect in the proximal left lower lobe pulmonary artery. Overall, there is very limited perfusion of both  the right upper and lower lobe pulmonary arteries.  The catheter was navigated into the right lower lobe pulmonary artery in a second hand injection confirmed catheter location. A Rosen wire was advanced through the catheter. Through the second 6 [place] vascular sheath a 100 cm angled catheter and a stiff Glidewire were used to select the left main pulmonary artery. Pulmonary arteriography was performed. The filling defects are present within multiple segmental branches of the left lower lobe pulmonary artery. The catheter was then advanced into the left lower lobe pulmonary artery and a second pulmonary arteriogram was performed confirming catheter location. A second Rosen wire was positioned in the left lower lobe pulmonary artery.  12 cm length EKOS catheters were then advanced over the Rosen wires and positioned in both the right and left lower lobe pulmonary arteries. The catheters extend into the main pulmonary arteries bilaterally.  The catheters were flushed and bilateral USAT was  initiated with 1 mg/hr TPA infused into each catheter.  COMPLICATIONS: None immediate  IMPRESSION: 1. Right worse than left bilateral sub massive pulmonary emboli. 2. Critical pulmonary arterial hypertension. The main pulmonary arterial mean pressures 49 mm Hg. 3. Successful initiation of bilateral ultrasound-accelerated pulmonary arterial thrombolysis. Signed,  Sterling Big, MD  [facility]  [facility] Radiology   Electronically Signed   By: Malachy Moan M.D.   On: 10/27/2013 16:08   Ir Angiogram Selective Each Additional Vessel  10/27/2013   CLINICAL DATA:  68 year old female with large volume bilateral acute pulmonary emboli and evidence of right heart strain both by CT and echocardiography consistent with sub massive PE. She presents for initiation of bilateral ultrasound accelerated catheter directed thrombolysis (USAT).  EXAM: IR INFUSION THROMBOL ARTERIAL INITIAL (MS); IR ULTRASOUND GUIDANCE VASC ACCESS RIGHT; ADDITIONAL ARTERIOGRAPHY; BILATERAL PULMONARY ARTERIOGRAPHY  Date: 10/27/2013  PROCEDURE: 1. Ultrasound-guided puncture of the right common femoral vein 2. Second ultrasound-guided puncture of the right common femoral vein 3. Catheterization of the right main pulmonary artery with limited arteriogram 4. Catheterization of the right lower lobe pulmonary artery with arteriogram 5. Catheterization of the left main pulmonary artery with arteriogram 6. Catheterization of the left lower lobe pulmonary artery with arteriogram 7. Placement of bilateral 12 cm EKOS infusion catheters 8. Initiation of pulmonary arterial lysis Interventional Radiologist:  Sterling Big, MD  ANESTHESIA/SEDATION: 100 mcg fentanyl administered intravenously  Total Moderate Sedation Time  45 minutes minutes.  FLUOROSCOPY TIME:  13 minutes 42 seconds.  CONTRAST:  15mL OMNIPAQUE IOHEXOL 300 MG/ML  SOLN  TECHNIQUE: Informed consent was obtained from the patient following  explanation of the procedure, risks, benefits and alternatives. The patient understands, agrees and consents for the procedure. All questions were addressed. A time out was performed.  Maximal barrier sterile technique utilized including caps, mask, sterile gowns, sterile gloves, large sterile drape, hand hygiene, and Betadine skin prep.  The right groin was interrogated with ultrasound. The right common femoral vein is patent and compressible. No evidence of a DVT. Image was obtained and stored for the medical record. Local anesthesia was attained by infiltration with 1% lidocaine. A small dermatotomy was made. Under real-time sonographic guidance, a 21 gauge micropuncture needle was used to puncture the common femoral vein. With the aid of a 5 [place] transitional micro sheath the 0.018 wire was exchanged for a Bentson wire and a 6 [place] vascular sheath was advanced into the vein.  Immediately above the initial access, a second ultrasound-guided puncture the common femoral vein was obtained  in the same manner. In similar fashion, a second 6 [place] vascular sheath was advanced into the right common femoral vein.  Using a C2 Cobra catheter and Bentson wire, the pulmonary arterial trunk was catheterized. Pressure measurements were obtained. The main pulmonary arterial pressure is 70/30 (mean 49) mmHg.  The catheter was then advanced into the right main pulmonary artery. A right pulmonary arteriogram was performed. Thrombus is present within the right main pulmonary artery extending into the right upper lobe pulmonary artery. Additionally, there is a filling defect in the proximal left lower lobe pulmonary artery. Overall, there is very limited perfusion of both the right upper and lower lobe pulmonary arteries.  The catheter was navigated into the right lower lobe pulmonary artery in a second hand injection confirmed catheter location. A Rosen wire was advanced through the catheter. Through the second 6 [place]  vascular sheath a 100 cm angled catheter and a stiff Glidewire were used to select the left main pulmonary artery. Pulmonary arteriography was performed. The filling defects are present within multiple segmental branches of the left lower lobe pulmonary artery. The catheter was then advanced into the left lower lobe pulmonary artery and a second pulmonary arteriogram was performed confirming catheter location. A second Rosen wire was positioned in the left lower lobe pulmonary artery.  12 cm length EKOS catheters were then advanced over the Rosen wires and positioned in both the right and left lower lobe pulmonary arteries. The catheters extend into the main pulmonary arteries bilaterally.  The catheters were flushed and bilateral USAT was initiated with 1 mg/hr TPA infused into each catheter.  COMPLICATIONS: None immediate  IMPRESSION: 1. Right worse than left bilateral sub massive pulmonary emboli. 2. Critical pulmonary arterial hypertension. The main pulmonary arterial mean pressures 49 mm Hg. 3. Successful initiation of bilateral ultrasound-accelerated pulmonary arterial thrombolysis. Signed,  Sterling Big, MD  [facility]  [facility] Radiology   Electronically Signed   By: Malachy Moan M.D.   On: 10/27/2013 16:08   Ir Angiogram Selective Each Additional Vessel  10/27/2013   CLINICAL DATA:  68 year old female with large volume bilateral acute pulmonary emboli and evidence of right heart strain both by CT and echocardiography consistent with sub massive PE. She presents for initiation of bilateral ultrasound accelerated catheter directed thrombolysis (USAT).  EXAM: IR INFUSION THROMBOL ARTERIAL INITIAL (MS); IR ULTRASOUND GUIDANCE VASC ACCESS RIGHT; ADDITIONAL ARTERIOGRAPHY; BILATERAL PULMONARY ARTERIOGRAPHY  Date: 10/27/2013  PROCEDURE: 1. Ultrasound-guided puncture of the right common femoral vein 2. Second ultrasound-guided puncture of the right common  femoral vein 3. Catheterization of the right main pulmonary artery with limited arteriogram 4. Catheterization of the right lower lobe pulmonary artery with arteriogram 5. Catheterization of the left main pulmonary artery with arteriogram 6. Catheterization of the left lower lobe pulmonary artery with arteriogram 7. Placement of bilateral 12 cm EKOS infusion catheters 8. Initiation of pulmonary arterial lysis Interventional Radiologist:  Sterling Big, MD  ANESTHESIA/SEDATION: 100 mcg fentanyl administered intravenously  Total Moderate Sedation Time  45 minutes minutes.  FLUOROSCOPY TIME:  13 minutes 42 seconds.  CONTRAST:  15mL OMNIPAQUE IOHEXOL 300 MG/ML  SOLN  TECHNIQUE: Informed consent was obtained from the patient following explanation of the procedure, risks, benefits and alternatives. The patient understands, agrees and consents for the procedure. All questions were addressed. A time out was performed.  Maximal barrier sterile technique utilized including caps, mask, sterile gowns, sterile gloves, large sterile drape, hand hygiene, and Betadine skin prep.  The right groin was interrogated with ultrasound. The right common femoral vein is patent and compressible. No evidence of a DVT. Image was obtained and stored for the medical record. Local anesthesia was attained by infiltration with 1% lidocaine. A small dermatotomy was made. Under real-time sonographic guidance, a 21 gauge micropuncture needle was used to puncture the common femoral vein. With the aid of a 5 [place] transitional micro sheath the 0.018 wire was exchanged for a Bentson wire and a 6 [place] vascular sheath was advanced into the vein.  Immediately above the initial access, a second ultrasound-guided puncture the common femoral vein was obtained in the same manner. In similar fashion, a second 6 [place] vascular sheath was advanced into the right common femoral vein.  Using a C2 Cobra catheter and Bentson wire, the pulmonary arterial  trunk was catheterized. Pressure measurements were obtained. The main pulmonary arterial pressure is 70/30 (mean 49) mmHg.  The catheter was then advanced into the right main pulmonary artery. A right pulmonary arteriogram was performed. Thrombus is present within the right main pulmonary artery extending into the right upper lobe pulmonary artery. Additionally, there is a filling defect in the proximal left lower lobe pulmonary artery. Overall, there is very limited perfusion of both the right upper and lower lobe pulmonary arteries.  The catheter was navigated into the right lower lobe pulmonary artery in a second hand injection confirmed catheter location. A Rosen wire was advanced through the catheter. Through the second 6 [place] vascular sheath a 100 cm angled catheter and a stiff Glidewire were used to select the left main pulmonary artery. Pulmonary arteriography was performed. The filling defects are present within multiple segmental branches of the left lower lobe pulmonary artery. The catheter was then advanced into the left lower lobe pulmonary artery and a second pulmonary arteriogram was performed confirming catheter location. A second Rosen wire was positioned in the left lower lobe pulmonary artery.  12 cm length EKOS catheters were then advanced over the Rosen wires and positioned in both the right and left lower lobe pulmonary arteries. The catheters extend into the main pulmonary arteries bilaterally.  The catheters were flushed and bilateral USAT was initiated with 1 mg/hr TPA infused into each catheter.  COMPLICATIONS: None immediate  IMPRESSION: 1. Right worse than left bilateral sub massive pulmonary emboli. 2. Critical pulmonary arterial hypertension. The main pulmonary arterial mean pressures 49 mm Hg. 3. Successful initiation of bilateral ultrasound-accelerated pulmonary arterial thrombolysis. Signed,  Sterling Big, MD  [facility]  [facility]  Radiology   Electronically Signed   By: Malachy Moan M.D.   On: 10/27/2013 16:08   Ir US Guide Vasc Access Right  10/27/2013   CLINICAL DATA:  68 year old female with large volume bilateral acute pulmonary emboli and evidence of right heart strain both by CT and echocardiography consistent with sub massive PE. She presents for initiation of bilateral ultrasound accelerated catheter directed thrombolysis (USAT).  EXAM: IR INFUSION THROMBOL ARTERIAL INITIAL (MS); IR ULTRASOUND GUIDANCE VASC ACCESS RIGHT; ADDITIONAL ARTERIOGRAPHY; BILATERAL PULMONARY ARTERIOGRAPHY  Date: 10/27/2013  PROCEDURE: 1. Ultrasound-guided puncture of the right common femoral vein 2. Second ultrasound-guided puncture of the right common femoral vein 3. Catheterization of the right main pulmonary artery with limited arteriogram 4. Catheterization of the right lower lobe pulmonary artery with arteriogram 5. Catheterization of the left main pulmonary artery with arteriogram 6. Catheterization of the left lower lobe pulmonary artery with arteriogram 7. Placement of bilateral 12 cm EKOS infusion  catheters 8. Initiation of pulmonary arterial lysis Interventional Radiologist:  Sterling Big, MD  ANESTHESIA/SEDATION: 100 mcg fentanyl administered intravenously  Total Moderate Sedation Time  45 minutes minutes.  FLUOROSCOPY TIME:  13 minutes 42 seconds.  CONTRAST:  15mL OMNIPAQUE IOHEXOL 300 MG/ML  SOLN  TECHNIQUE: Informed consent was obtained from the patient following explanation of the procedure, risks, benefits and alternatives. The patient understands, agrees and consents for the procedure. All questions were addressed. A time out was performed.  Maximal barrier sterile technique utilized including caps, mask, sterile gowns, sterile gloves, large sterile drape, hand hygiene, and Betadine skin prep.  The right groin was interrogated with ultrasound. The right common femoral vein is patent and compressible. No evidence of a DVT. Image  was obtained and stored for the medical record. Local anesthesia was attained by infiltration with 1% lidocaine. A small dermatotomy was made. Under real-time sonographic guidance, a 21 gauge micropuncture needle was used to puncture the common femoral vein. With the aid of a 5 [place] transitional micro sheath the 0.018 wire was exchanged for a Bentson wire and a 6 [place] vascular sheath was advanced into the vein.  Immediately above the initial access, a second ultrasound-guided puncture the common femoral vein was obtained in the same manner. In similar fashion, a second 6 [place] vascular sheath was advanced into the right common femoral vein.  Using a C2 Cobra catheter and Bentson wire, the pulmonary arterial trunk was catheterized. Pressure measurements were obtained. The main pulmonary arterial pressure is 70/30 (mean 49) mmHg.  The catheter was then advanced into the right main pulmonary artery. A right pulmonary arteriogram was performed. Thrombus is present within the right main pulmonary artery extending into the right upper lobe pulmonary artery. Additionally, there is a filling defect in the proximal left lower lobe pulmonary artery. Overall, there is very limited perfusion of both the right upper and lower lobe pulmonary arteries.  The catheter was navigated into the right lower lobe pulmonary artery in a second hand injection confirmed catheter location. A Rosen wire was advanced through the catheter. Through the second 6 [place] vascular sheath a 100 cm angled catheter and a stiff Glidewire were used to select the left main pulmonary artery. Pulmonary arteriography was performed. The filling defects are present within multiple segmental branches of the left lower lobe pulmonary artery. The catheter was then advanced into the left lower lobe pulmonary artery and a second pulmonary arteriogram was performed confirming catheter location. A second Rosen wire was positioned in the left lower lobe pulmonary  artery.  12 cm length EKOS catheters were then advanced over the Rosen wires and positioned in both the right and left lower lobe pulmonary arteries. The catheters extend into the main pulmonary arteries bilaterally.  The catheters were flushed and bilateral USAT was initiated with 1 mg/hr TPA infused into each catheter.  COMPLICATIONS: None immediate  IMPRESSION: 1. Right worse than left bilateral sub massive pulmonary emboli. 2. Critical pulmonary arterial hypertension. The main pulmonary arterial mean pressures 49 mm Hg. 3. Successful initiation of bilateral ultrasound-accelerated pulmonary arterial thrombolysis. Signed,  Sterling Big, MD  [facility]  [facility]   Electronically Signed   By: Malachy Moan M.D.   On: 10/27/2013 16:08   Ir Infusion Thrombol Arterial Initial (ms)  10/27/2013   CLINICAL DATA:  68 year old female with large volume bilateral acute pulmonary emboli and evidence of right heart strain both by CT and echocardiography consistent with sub massive  PE. She presents for initiation of bilateral ultrasound accelerated catheter directed thrombolysis (USAT).  EXAM: IR INFUSION THROMBOL ARTERIAL INITIAL (MS); IR ULTRASOUND GUIDANCE VASC ACCESS RIGHT; ADDITIONAL ARTERIOGRAPHY; BILATERAL PULMONARY ARTERIOGRAPHY  Date: 10/27/2013  PROCEDURE: 1. Ultrasound-guided puncture of the right common femoral vein 2. Second ultrasound-guided puncture of the right common femoral vein 3. Catheterization of the right main pulmonary artery with limited arteriogram 4. Catheterization of the right lower lobe pulmonary artery with arteriogram 5. Catheterization of the left main pulmonary artery with arteriogram 6. Catheterization of the left lower lobe pulmonary artery with arteriogram 7. Placement of bilateral 12 cm EKOS infusion catheters 8. Initiation of pulmonary arterial lysis Interventional Radiologist:  Sterling Big, MD  ANESTHESIA/SEDATION: 100  mcg fentanyl administered intravenously  Total Moderate Sedation Time  45 minutes minutes.  FLUOROSCOPY TIME:  13 minutes 42 seconds.  CONTRAST:  15mL OMNIPAQUE IOHEXOL 300 MG/ML  SOLN  TECHNIQUE: Informed consent was obtained from the patient following explanation of the procedure, risks, benefits and alternatives. The patient understands, agrees and consents for the procedure. All questions were addressed. A time out was performed.  Maximal barrier sterile technique utilized including caps, mask, sterile gowns, sterile gloves, large sterile drape, hand hygiene, and Betadine skin prep.  The right groin was interrogated with ultrasound. The right common femoral vein is patent and compressible. No evidence of a DVT. Image was obtained and stored for the medical record. Local anesthesia was attained by infiltration with 1% lidocaine. A small dermatotomy was made. Under real-time sonographic guidance, a 21 gauge micropuncture needle was used to puncture the common femoral vein. With the aid of a 5 [place] transitional micro sheath the 0.018 wire was exchanged for a Bentson wire and a 6 [place] vascular sheath was advanced into the vein.  Immediately above the initial access, a second ultrasound-guided puncture the common femoral vein was obtained in the same manner. In similar fashion, a second 6 [place] vascular sheath was advanced into the right common femoral vein.  Using a C2 Cobra catheter and Bentson wire, the pulmonary arterial trunk was catheterized. Pressure measurements were obtained. The main pulmonary arterial pressure is 70/30 (mean 49) mmHg.  The catheter was then advanced into the right main pulmonary artery. A right pulmonary arteriogram was performed. Thrombus is present within the right main pulmonary artery extending into the right upper lobe pulmonary artery. Additionally, there is a filling defect in the proximal left lower lobe pulmonary artery. Overall, there is very limited perfusion of both the  right upper and lower lobe pulmonary arteries.  The catheter was navigated into the right lower lobe pulmonary artery in a second hand injection confirmed catheter location. A Rosen wire was advanced through the catheter. Through the second 6 [place] vascular sheath a 100 cm angled catheter and a stiff Glidewire were used to select the left main pulmonary artery. Pulmonary arteriography was performed. The filling defects are present within multiple segmental branches of the left lower lobe pulmonary artery. The catheter was then advanced into the left lower lobe pulmonary artery and a second pulmonary arteriogram was performed confirming catheter location. A second Rosen wire was positioned in the left lower lobe pulmonary artery.  12 cm length EKOS catheters were then advanced over the Rosen wires and positioned in both the right and left lower lobe pulmonary arteries. The catheters extend into the main pulmonary arteries bilaterally.  The catheters were flushed and bilateral USAT was initiated with 1 mg/hr TPA infused into each catheter.  COMPLICATIONS:  None immediate  IMPRESSION: 1. Right worse than left bilateral sub massive pulmonary emboli. 2. Critical pulmonary arterial hypertension. The main pulmonary arterial mean pressures 49 mm Hg. 3. Successful initiation of bilateral ultrasound-accelerated pulmonary arterial thrombolysis. Signed,  Sterling Big, MD  [facility]  [facility]   Electronically Signed   By: Malachy Moan M.D.   On: 10/27/2013 16:08   Ir Rande Lawman F/u Eval Art/ven Final Day (ms)  10/28/2013   INDICATION: 68 year old female with history of large volume bilateral pulmonary embolism and evidence of right heart strain on both CT and echo cardial echocardiography consistent with sub massive pulmonary embolism. Post initiation of bilateral ultrasound accelerated catheter directed thrombolysis (USAT) on 10/27/2013. Patient presents today for  follow-up examination.  EXAM: 1. ARTERIOGRAPHY OF THE MAIN PULMONARY ARTERY 2. FLUOROSCOPIC GUIDED EVALUATION OF PULMONARY ARTERIAL PRESSURES  COMPARISON:  Chest CTA - 09/26/2013  MEDICATIONS: None  CONTRAST:  30 cc OMNIPAQUE IOHEXOL 300 MG/ML  SOLN  FLUOROSCOPY TIME:  4 minutes.  COMPLICATIONS: None immediate  TECHNIQUE: Informed written consent was obtained from the patient after a discussion of the risks, benefits and alternatives to treatment. Questions regarding the procedure were encouraged and answered. A timeout was performed prior to the initiation of the procedure.  Preprocedural spot fluoroscopic image was obtained.  The right pulmonary arterial infusion catheter was cannulated within exchange length Bentson wire and exchanged for a pigtail catheter. The pigtail catheter was utilized to obtain pulmonary arterial pressures at the level of the right pulmonary artery.  The left pulmonary arterial infusion catheter was then exchanged for the pigtail catheter and utilized to obtain pulmonary arterial pressures at the level of the left pulmonary artery appear the pigtail catheter was then retracted to the level of the main pulmonary artery. Limited main pulmonary arteriogram was performed to confirm appropriate positioning and pulmonary arterial pressure was acquired at the level of the main pulmonary artery.  At this point, the procedure was terminated. All wires catheters and sheaths were removed from the patient. Hemostasis was achieved at the right groin access site with manual compression. A dressing was placed. The patient tolerated the procedure well without immediate postprocedural complication.  FINDINGS: A preprocedural spot fluoroscopic image demonstrates unchanged positioning of the bilateral pulmonary arterial infusion catheters. Pressure measurements were obtained as detailed above. A limited main pulmonary arteriogram was performed to confirm appropriate positioning during the measurements of the  level of the main pulmonary artery.  Acquired pulmonary arterial pressure measurements:  Right main pulmonary artery - 61/20 ; mean - 38  Left main pulmonary artery - 63/29 ; mean - 43  Main pulmonary artery - 62/13; mean - 36 (previously: 73/30; mean - 49).  IMPRESSION: Technically successful bilateral ultrasound accelerated pulmonary arterial thrombolysis with reduction of the mean main pulmonary arterial pressure from 49 mm Hg to 36 mm Hg (13 mm Hg reduction in mean main pulmonary arterial pressure).  PLAN: - Patient will continue on a heparin drip with event conversion to long-term anti coagulation as directed by PCCM.  - Will obtain follow-up chest CTA and cardiac echo approximately 48 hours after the beginning of the USAT (tomorrow afternoon).  Above findings discussed with Dr. Sung Amabile at the completion of the procedure.   Electronically Signed   By: Simonne Come M.D.   On: 10/28/2013 12:30    2D Echo:  Study Conclusions - Left ventricle: The cavity size was normal. Wall thickness was normal. The estimated ejection fraction was 55%. Wall  motion was normal; there were no regional wall motion abnormalities. Doppler parameters are consistent with abnormal left ventricular relaxation (grade 1 diastolic dysfunction). - Ventricular septum: D-shaped interventricular septum suggesting RV pressure/volume overload. - Aortic valve: There was no stenosis. - Mitral valve: There was no significant regurgitation. - Right ventricle: The cavity size was moderately to severely dilated. Systolic function was moderately reduced. - Right atrium: The atrium was mildly dilated. - Pulmonary arteries: PA peak pressure: 51 mm Hg (S). - Inferior vena cava: The vessel was normal in size. The respirophasic diameter changes were in the normal range (>= 50%), consistent with normal central venous pressure. - Pericardium, extracardiac: A trivial pericardial effusion was identified posterior to the  heart.  Impressions:  - Normal LV size and systolic function, EF 55%. D-shaped interventricular septum suggests RV pressure/volume overload. Moderate-severely dilated RV with moderately decreased systolic function. Moderate pulmonary hypertension. Findings consistent with hemodynamically significant pulmonary embolus.   Admission HPI: [name] is a 68 year old woman with history of obesity, CKD, OSA, HTN, depression, previous PE in 2003 (treated with coumadin x 6 months) presenting with progressive shortness of breath x 6 days. She first was short of breathing Friday taking out her trash and has progressed to only being able to walk to the bathroom before getting short of breath. She does not recall having shortness of breath prior to this. No SOB at rest. Reports palpitations. Denies cough, hemoptysis, orthopnea, PND, chest pain. This is similar to her previous episode of PE. She has been less active for the last month - mostly bed bound 2/2 her depression. Reports medication compliance. No recent travel. Denies family history of bleeding/clotting problems. Denies medications with estrogens.   Denies fevers, chills, nausea, vomiting, abdominal pain, diarrhea, dysuria, hematuria, lightheadedness, dizziness, edema, paresthesias, arthralgias, myalagias   Hospital Course by problem list: Principal Problem:   Bilateral pulmonary embolism Active Problems:   HYPERLIPIDEMIA   DEPRESSION   OBSTRUCTIVE SLEEP APNEA   HYPERTENSION   GERD   CKD (chronic kidney disease), stage II   Prediabetes   #Bilateral submassive PE: She was started on heparin gtt and had O2 sats in low 90s on 4L of O2. Echo showed signs of R heart strain though EF 55%. LE Dupplex unremarkable for DVT bilaterally. Critical Care discussed with her the risks and benefits of US-assisted thrombolysis and arranged for IR to perform the procedure. She was transferred to ICU and received tPA  infused over 12 hours; thrombolysis  was confirmed with repeat CT scan. She was transferred out of the ICU and transitioned heparin gtt->Xarelto  BID. At discharge, she did not have an oxygen require. She was explained the importance of adhering to Xarelto and that her dose would change  BID->20mg  QD on 9/19 as well as what to do in the event she missed doses (if 1 dose missed on BID schedule, take 2 tablets at once and resume normal schedule the following day; if dose missed while QD, take 1 tablet when remembered before resuming normal schedule the following day).  #Depression: Home meds were not initially started as doses were unknown but were later restarted. She denied any mood symptoms during her hospital stay.  #Prediabetes: A1c 6.2 on admission. She expressed an interest in working on diet & exercise.  #HTN: Home meds were held initially, and she remained normotensive during her hospital stay.  #OSA: Remained stable on CPAP.  #CKD Stage II: Crt remained stable 0.9-1.0 during her hospital stay.  #GERD: Remained stable on  home medications.  #Hyperlipidemia: Remained stable on home medications.   Discharge Vitals:   BP 128/77  Pulse 75  Temp(Src) 98 F (36.7 C) (Oral)  Resp 17  Ht 5' 7.5" (1.715 m)  Wt 330 lb 0.5 oz (149.7 kg)  BMI 50.90 kg/m2  SpO2 95%  Discharge Labs:  Results for orders placed during the hospital encounter of 10/26/13 (from the past 24 hour(s))  CBC     Status: None   Collection Time    10/31/13 12:48 AM      Result Value Ref Range   WBC 6.4  4.0 - 10.5 K/uL   RBC 4.86  3.87 - 5.11 MIL/uL   Hemoglobin 12.7  12.0 - 15.0 g/dL   HCT 16.1  09.6 - 04.5 %   MCV 81.3  78.0 - 100.0 fL   MCH 26.1  26.0 - 34.0 pg   MCHC 32.2  30.0 - 36.0 g/dL   RDW 40.9  81.1 - 91.4 %   Platelets 175  150 - 400 K/uL  BASIC METABOLIC PANEL     Status: Abnormal   Collection Time    10/31/13 12:48 AM      Result Value Ref Range   Sodium 139  137 - 147 mEq/L   Potassium 3.9  3.7 - 5.3 mEq/L   Chloride  102  96 - 112 mEq/L   CO2 23  19 - 32 mEq/L   Glucose, Bld 101 (*) 70 - 99 mg/dL   BUN 12  6 - 23 mg/dL   Creatinine, Ser 7.82  0.50 - 1.10 mg/dL   Calcium 8.9  8.4 - 95.6 mg/dL   GFR calc non Af Amer 59 (*) >90 mL/min   GFR calc Af Amer 68 (*) >90 mL/min   Anion gap 14  5 - 15    Signed: Heywood Iles, MD 11/02/2013, 2:01 PM    Services Ordered on Discharge: none Equipment Ordered on Discharge: none

## 2013-10-31 NOTE — Progress Notes (Signed)
  Date: 10/31/2013  Patient name: [name]  Medical record number: [id number]  Date of birth: 01-17-1946   This patient has been seen and the plan of care was discussed with the house staff. Please see their note for complete details. I concur with their findings with the following additions/corrections: [name] is stable for D/C home. She wil F/U [facility]. She will need lifelong anticoagulation as long as bleeding risks remain acceptable.   [name], MD 10/31/2013, 3:54 PM

## 2013-10-31 NOTE — Progress Notes (Signed)
ANTICOAGULATION CONSULT NOTE - Follow Up Consult  Pharmacy Consult for Xarelto Indication: pulmonary embolus  Allergies  Allergen Reactions  . Lisinopril     REACTION: PT with cough    Patient Measurements: Height: 5' 7.5" (171.5 cm) Weight: 330 lb 0.5 oz (149.7 kg) IBW/kg (Calculated) : 62.75 Heparin Dosing Weight:    Vital Signs: Temp: 98 F (36.7 C) (08/31 0501) Temp src: Oral (08/31 0501) BP: 128/77 mmHg (08/31 0501) Pulse Rate: 75 (08/31 0501)  Labs:  Recent Labs  10/28/13 1630 10/28/13 1800 10/29/13 0430 10/30/13 0545 10/31/13 0048  HGB 13.1  --   --  12.9 12.7  HCT 39.4  --   --  39.6 39.5  PLT 145*  --   --  152 175  HEPARINUNFRC  --  0.21*  --   --   --   CREATININE  --   --  0.93 0.94 0.98    Estimated Creatinine Clearance: 87 ml/min (by C-G formula based on Cr of 0.98).   Assessment: 15 yoF w/ hx PE (2003, tx with coumadin x96mos) admitted 10/26/2013 to ED w/ SOB/CP x 6 days, found to have PE,now s/p EKOS.  AntiCoag/heme: large vol bilat PE with R heart strain. S/p EKOS/TPA. S/p heparin transition to rivaroxaban 8/29. LE dopplers neg. CBC stable, no bleeding noted  CV:HTN, HLD. ECHO 8/26: EF 55% on Zocor. VSS  Endo: no hx (A1c 6.2). CBGs ok. TSH 4.01  GI/Nut: PPI  Neuro: depression, arthritis on Bupropion, duloxetine. Alprazolam prn  Nephro: CKD stage 2, Scr 0.98  Pulm: OSA (refuses CPAP)/ 95% RA  Heme/Onc: fibrocystic breast Dz  PTA Med Issues: amlodipine, hctz, KCl,  Best Practices:, ppi   Goal of Therapy:  Therapeutic oral anticoagulation Monitor platelets by anticoagulation protocol: Yes   Plan:  Xarelto  BID   Laquisha Northcraft S. Merilynn Finland, PharmD, BCPS Clinical Staff Pharmacist Pager [phone]  Misty Stanley Stillinger 10/31/2013,12:24 PM

## 2013-10-31 NOTE — Clinical Social Work Note (Signed)
Clinical Social Worker received referral for advance directives completion.  Per RN, patient provided with packet and plans to further discuss with family following discharge.  Pastoral Care number provided to RN if patient decides to complete prior to discharge.  Chart reviewed.  PT/OT recommending home with home health.  Spoke with RN Case Manager who will follow up with patient to discuss home health needs.    CSW signing off - please re consult if social work needs arise.  Macario Golds, [place] [phone]

## 2013-11-01 ENCOUNTER — Other Ambulatory Visit: Payer: Self-pay | Admitting: Internal Medicine

## 2013-11-01 MED ORDER — RIVAROXABAN 20 MG PO TABS: 20.0000 mg | ORAL_TABLET | Freq: Every day | ORAL | Status: DC

## 2013-11-01 NOTE — Telephone Encounter (Signed)
Pt has an appt on 11-18-13. ATC, voicemail full. WCB. Carron Curie, CMA

## 2013-11-01 NOTE — Progress Notes (Signed)
Would you pls check with her pharmacy to ensure they got the Rx for Xarelto 20 mg QD with 5 RF? Is there anyway the pharmacy can indicate that the  is supposed to start AFTER she finishes 21 days of 15 BID? Thanks!

## 2013-11-02 NOTE — Telephone Encounter (Signed)
ATC PT, NA, VM full WCB

## 2013-11-03 ENCOUNTER — Telehealth: Payer: Self-pay | Admitting: Radiology

## 2013-11-03 NOTE — Telephone Encounter (Signed)
Spoke with pt and verified that she did get rx for Xarelto and also confirmed appt with Dr Kendrick Fries on 11/18/13.

## 2013-11-03 NOTE — Telephone Encounter (Signed)
Patient states that she is doing well post procedure.  Sx have greatly improved.    Has started walking short distances once/day.  States that she experiences some shortness of breath w/ exertion.  Resolves with rest.    Instructed patient to call as needed for questions/concerns.  Appointment to follow up w/ Dr Katherina Right scheduled for 11/22/2013 at 2pm.    Amado Nash, RN 11/03/2013 10:34 AM

## 2013-11-14 ENCOUNTER — Encounter: Payer: Self-pay | Admitting: Internal Medicine

## 2013-11-14 ENCOUNTER — Ambulatory Visit (INDEPENDENT_AMBULATORY_CARE_PROVIDER_SITE_OTHER): Payer: Commercial Managed Care - HMO | Admitting: Internal Medicine

## 2013-11-14 VITALS — BP 136/81 | HR 79 | Temp 98.3°F | Wt 343.3 lb

## 2013-11-14 DIAGNOSIS — Z23 Encounter for immunization: Secondary | ICD-10-CM

## 2013-11-14 DIAGNOSIS — R7303 Prediabetes: Secondary | ICD-10-CM

## 2013-11-14 DIAGNOSIS — R7309 Other abnormal glucose: Secondary | ICD-10-CM

## 2013-11-14 DIAGNOSIS — Z0000 Encounter for general adult medical examination without abnormal findings: Secondary | ICD-10-CM

## 2013-11-14 DIAGNOSIS — I2699 Other pulmonary embolism without acute cor pulmonale: Secondary | ICD-10-CM

## 2013-11-14 DIAGNOSIS — D6861 Antiphospholipid syndrome: Secondary | ICD-10-CM | POA: Insufficient documentation

## 2013-11-14 DIAGNOSIS — D6859 Other primary thrombophilia: Secondary | ICD-10-CM

## 2013-11-14 NOTE — Patient Instructions (Signed)
General Instructions:   Thank you for bringing your medicines today. This helps [place] keep you safe from mistakes.  PLEASE remember to switch your Xarelto from 15 mg twice a day to Xarelto 20 mg ONCE A DAY WITH DINNER. Start this dose on Saturday, November 19, 2013.   Please take all of your medications as prescribed.  Please make an appointment with Norm Parcel for nutrition counseling.  Please keep your appointment with Dr. Rogelia Boga in October for follow up.  Diabetes Mellitus and Food It is important for you to manage your blood sugar (glucose) level. Your blood glucose level can be greatly affected by what you eat. Eating healthier foods in the appropriate amounts throughout the day at about the same time each day will help you control your blood glucose level. It can also help slow or prevent worsening of your diabetes mellitus. Healthy eating may even help you improve the level of your blood pressure and reach or maintain a healthy weight.  HOW CAN FOOD AFFECT ME? Carbohydrates Carbohydrates affect your blood glucose level more than any other type of food. Your dietitian will help you determine how many carbohydrates to eat at each meal and teach you how to count carbohydrates. Counting carbohydrates is important to keep your blood glucose at a healthy level, especially if you are using insulin or taking certain medicines for diabetes mellitus. Alcohol Alcohol can cause sudden decreases in blood glucose (hypoglycemia), especially if you use insulin or take certain medicines for diabetes mellitus. Hypoglycemia can be a life-threatening condition. Symptoms of hypoglycemia (sleepiness, dizziness, and disorientation) are similar to symptoms of having too much alcohol.  If your health care provider has given you approval to drink alcohol, do so in moderation and use the following guidelines:  Women should not have more than one drink per day, and men should not have more than two drinks per day.  One drink is equal to:  12 oz of beer.  5 oz of wine.  1 oz of hard liquor.  Do not drink on an empty stomach.  Keep yourself hydrated. Have water, diet soda, or unsweetened iced tea.  Regular soda, juice, and other mixers might contain a lot of carbohydrates and should be counted. WHAT FOODS ARE NOT RECOMMENDED? As you make food choices, it is important to remember that all foods are not the same. Some foods have fewer nutrients per serving than other foods, even though they might have the same number of calories or carbohydrates. It is difficult to get your body what it needs when you eat foods with fewer nutrients. Examples of foods that you should avoid that are high in calories and carbohydrates but low in nutrients include:  Trans fats (most processed foods list trans fats on the Nutrition Facts label).  Regular soda.  Juice.  Candy.  Sweets, such as cake, pie, doughnuts, and cookies.  Fried foods. WHAT FOODS CAN I EAT? Have nutrient-rich foods, which will nourish your body and keep you healthy. The food you should eat also will depend on several factors, including:  The calories you need.  The medicines you take.  Your weight.  Your blood glucose level.  Your blood pressure level.  Your cholesterol level. You also should eat a variety of foods, including:  Protein, such as meat, poultry, fish, tofu, nuts, and seeds (lean animal proteins are best).  Fruits.  Vegetables.  Dairy products, such as milk, cheese, and yogurt (low fat is best).  Breads, grains, pasta, cereal, rice,  and beans.  Fats such as olive oil, trans fat-free margarine, canola oil, avocado, and olives. DOES EVERYONE WITH DIABETES MELLITUS HAVE THE SAME MEAL PLAN? Because every person with diabetes mellitus is different, there is not one meal plan that works for everyone. It is very important that you meet with a dietitian who will help you create a meal plan that is just right for  you. Document Released: 11/14/2004 Document Revised: 02/22/2013 Document Reviewed: 01/14/2013 [facility] Patient Information 2015 [place], [place]. This information is not intended to replace advice given to you by your health care provider. Make sure you discuss any questions you have with your health care provider.  Diabetes and Exercise Exercising regularly is important. It is not just about losing weight. It has many health benefits, such as:  Improving your overall fitness, flexibility, and endurance.  Increasing your bone density.  Helping with weight control.  Decreasing your body fat.  Increasing your muscle strength.  Reducing stress and tension.  Improving your overall health. People with diabetes who exercise gain additional benefits because exercise:  Reduces appetite.  Improves the body's use of blood sugar (glucose).  Helps lower or control blood glucose.  Decreases blood pressure.  Helps control blood lipids (such as cholesterol and triglycerides).  Improves the body's use of the hormone insulin by:  Increasing the body's insulin sensitivity.  Reducing the body's insulin needs.  Decreases the risk for heart disease because exercising:  Lowers cholesterol and triglycerides levels.  Increases the levels of good cholesterol (such as high-density lipoproteins [HDL]) in the body.  Lowers blood glucose levels. YOUR ACTIVITY PLAN  Choose an activity that you enjoy and set realistic goals. Your health care provider or diabetes educator can help you make an activity plan that works for you. Exercise regularly as directed by your health care provider. This includes:  Performing resistance training twice a week such as push-ups, sit-ups, lifting weights, or using resistance bands.  Performing 150 minutes of cardio exercises each week such as walking, running, or playing sports.  Staying active and spending no more than 90 minutes at one time being inactive. Even  short bursts of exercise are good for you. Three 10-minute sessions spread throughout the day are just as beneficial as a single 30-minute session. Some exercise ideas include:  Taking the dog for a walk.  Taking the stairs instead of the elevator.  Dancing to your favorite song.  Doing an exercise video.  Doing your favorite exercise with a friend. RECOMMENDATIONS FOR EXERCISING WITH TYPE 1 OR TYPE 2 DIABETES   Check your blood glucose before exercising. If blood glucose levels are greater than 240 mg/dL, check for urine ketones. Do not exercise if ketones are present.  Avoid injecting insulin into areas of the body that are going to be exercised. For example, avoid injecting insulin into:  The arms when playing tennis.  The legs when jogging.  Keep a record of:  Food intake before and after you exercise.  Expected peak times of insulin action.  Blood glucose levels before and after you exercise.  The type and amount of exercise you have done.  Review your records with your health care provider. Your health care provider will help you to develop guidelines for adjusting food intake and insulin amounts before and after exercising.  If you take insulin or oral hypoglycemic agents, watch for signs and symptoms of hypoglycemia. They include:  Dizziness.  Shaking.  Sweating.  Chills.  Confusion.  Drink plenty of water  while you exercise to prevent dehydration or heat stroke. Body water is lost during exercise and must be replaced.  Talk to your health care provider before starting an exercise program to make sure it is safe for you. Remember, almost any type of activity is better than none. Document Released: 05/10/2003 Document Revised: 07/04/2013 Document Reviewed: 07/27/2012 [facility] Patient Information 2015 [place], [place]. This information is not intended to replace advice given to you by your health care provider. Make sure you discuss any questions you have with  your health care provider.

## 2013-11-14 NOTE — Assessment & Plan Note (Signed)
Patient was referred for DEXA scan and mammogram.  She received her flu shot today.

## 2013-11-14 NOTE — Progress Notes (Signed)
Patient ID: [name], female   DOB: 11-17-1945, 68 y.o.   MRN: [id number]  I saw and evaluated the patient.  I personally confirmed the key portions of the history and exam documented by Dr. Senaida Ores and I reviewed pertinent patient test results.  The assessment, diagnosis, and plan were formulated together and I agree with the documentation in the resident's note.

## 2013-11-14 NOTE — Assessment & Plan Note (Addendum)
Assessment: Recurrent b/l PE (2003 and 2015). Patient had hypercoagulable work up for PE which was positive for Cardiolipin IgM at 48 (>40). Patient meets the criteria for Antiphospholipid Syndrome, but will need a repeat Cardiolipin antibody screen in 12 weeks (Beginning of December 2015) for confirmation of diagnosis.   Plan: Will transition patient from Xarelto 15 mg BID to Xarelto 20 mg daily on 11/19/2013. Patient already has her prescription. She will follow up October 15th with Dr. Rogelia Boga. Will need repeat cardiolipin IgM testing in 12 weeks.

## 2013-11-14 NOTE — Assessment & Plan Note (Signed)
Assessment: Recurrent b/l PE (2003 and 2015). Patient had hypercoagulable work up for PE which was positive for Cardiolipin IgM at 48 (>40). Patient meets the criteria for Antiphospholipid Syndrome, but will need a repeat Cardiolipin antibody screen in 12 weeks (Beginning of December 2015) for confirmation of diagnosis.   Plan: Will transition patient from Xarelto 15 mg BID to Xarelto 20 mg daily on 11/19/2013. Patient already has her prescription. She will follow up October 15th with Dr. Rogelia Boga.

## 2013-11-14 NOTE — Assessment & Plan Note (Signed)
Assessment: Hemoglobin A1C was 6.2 during admission.  Plan: Patient counseled on diet and exercise. Patient will schedule appointment with Lupita Leash Plyler to further discuss diabetes control with diet and exercise.

## 2013-11-14 NOTE — Progress Notes (Signed)
   Subjective:    Patient ID: [name], female    DOB: 1945/04/04, 68 y.o.   MRN: [id number]  HPI Ms. Midkiff is a 68 yo female with PMHx of bilateral pulmonary embolism (2003 and 2015), HLD, OSA, HTN, GERD, and CKD stage II who presents for follow up after her hospital admission for b/l PE. Patient was having shortness of breath for 5 days prior to admission and was then found to have submassive b/l PE on CT angiogram. Her LE venous dopplers were negative for DVT b/l. Patient was treated with tPA in the ICU for 24 hours, transitioned to heparin drip, and then transitioned to Xarelto 15 mg BID on discharge. She presents today for follow up and denies any issues of shortness of breath, chest pain, fatigue, dyspnea, melena, hematochezia or bleeding.  Review of Systems General: Denies fever, chills, fatigue, change in appetite and diaphoresis.  Respiratory: Denies SOB, cough, DOE, chest tightness, and wheezing.   Cardiovascular: Denies chest pain and palpitations.  Gastrointestinal: Denies nausea, vomiting, blood in stool.  Genitourinary: Denies dysuria, urgency, frequency, hematuria, suprapubic pain and flank pain. Endocrine: Denies hot or cold intolerance, polyuria, and polydipsia. Musculoskeletal: Denies myalgias, back pain, joint swelling, arthralgias and gait problem.  Skin: Denies pallor, rash and wounds.  Neurological: Denies dizziness, headaches, weakness, lightheadedness, numbness,seizures, and syncope, Psychiatric/Behavioral: Denies mood changes, confusion, nervousness, sleep disturbance and agitation.     Objective:   Physical Exam Filed Vitals:   11/14/13 1114  BP: 136/81  Pulse: 79  Temp: 98.3 F (36.8 C)  TempSrc: Oral  Weight: 343 lb 4.8 oz (155.72 kg)  SpO2: 97%   General: Vital signs reviewed.  Patient is well-developed and well-nourished, in no acute distress and cooperative with exam.  Cardiovascular: RRR, S1 normal, S2 normal, no murmurs, gallops, or  rubs. Pulmonary/Chest: Clear to auscultation bilaterally, no wheezes, rales, or rhonchi. Abdominal: Soft, non-tender, non-distended, BS +, no masses, organomegaly, or guarding present.  Musculoskeletal: No joint deformities, erythema, or stiffness, ROM full and nontender. Extremities: No lower extremity edema bilaterally, calf tenderness, pulses symmetric and intact bilaterally. No cyanosis or clubbing. Skin: Warm, dry and intact. No rashes or erythema. Psychiatric: Normal mood and affect. speech and behavior is normal. Cognition and memory are normal.      Assessment & Plan:   Please see problem based assessment and plan.

## 2013-11-18 ENCOUNTER — Encounter: Payer: Self-pay | Admitting: Pulmonary Disease

## 2013-11-18 ENCOUNTER — Ambulatory Visit (INDEPENDENT_AMBULATORY_CARE_PROVIDER_SITE_OTHER): Payer: Commercial Managed Care - HMO | Admitting: Pulmonary Disease

## 2013-11-18 VITALS — BP 128/78 | HR 82 | Temp 98.7°F | Ht 67.5 in | Wt 341.0 lb

## 2013-11-18 DIAGNOSIS — R0902 Hypoxemia: Secondary | ICD-10-CM | POA: Insufficient documentation

## 2013-11-18 DIAGNOSIS — I272 Pulmonary hypertension, unspecified: Secondary | ICD-10-CM

## 2013-11-18 DIAGNOSIS — I2789 Other specified pulmonary heart diseases: Secondary | ICD-10-CM

## 2013-11-18 DIAGNOSIS — J9611 Chronic respiratory failure with hypoxia: Secondary | ICD-10-CM

## 2013-11-18 DIAGNOSIS — D6861 Antiphospholipid syndrome: Secondary | ICD-10-CM

## 2013-11-18 DIAGNOSIS — Z86718 Personal history of other venous thrombosis and embolism: Secondary | ICD-10-CM

## 2013-11-18 DIAGNOSIS — I2699 Other pulmonary embolism without acute cor pulmonale: Secondary | ICD-10-CM

## 2013-11-18 DIAGNOSIS — D6859 Other primary thrombophilia: Secondary | ICD-10-CM

## 2013-11-18 HISTORY — DX: Chronic respiratory failure with hypoxia: J96.11

## 2013-11-18 NOTE — Assessment & Plan Note (Signed)
[name] has been doing well since hospital discharge. Her shortness of breath has completely resolved and she has no signs or symptoms of ongoing pulmonary hypertension at this point. However, she needs to be monitored carefully.  Plan: -She needs lifelong anticoagulation -Repeat echocardiogram in 3 months (see below)

## 2013-11-18 NOTE — Assessment & Plan Note (Signed)
Certainly some component of her pulmonary hypertension during her recent hospitalization was secondary to the acute pulmonary embolism. However, I think that there is likely some degree of underlying chronic pulmonary hypertension from obstructive sleep apnea. She is currently non-compliant with therapy.  Plan: -I strongly encouraged her today to lose weight -I strongly encouraged her today to use her CPAP machine on a regular basis.

## 2013-11-18 NOTE — Progress Notes (Signed)
Subjective:    Patient ID: [name], female    DOB: [birth year], 68 y.o.   MRN: [id number]  Synopsis: This is a very pleasant lady who first met [facility] in August 2015 when she had her second pulmonary embolism. Her first episode was in 2002. In 2015 she had a large bilateral pulmonary embolism with associated pulmonary hypertension seen on echocardiogram. She underwent a catheter directed thrombolysis procedure.  HPI  11/18/2013 ROV> [name] says that she has been doing OK.  She as been walking some and has been OK.  She continues to struggle from her depresion some.  She contines to take her medication for her depression.  Her breathing is much better since the came to the hospital. She is really feeling a lot better now from that regards.  She has been out and about shopping, going to hr support group.  She still hasn't set up her sleep apnea machine yet.    Past Medical History  Diagnosis Date  . Depression     followed by Dr. Toy Care  . Diverticulosis of colon   . History of hepatitis B   . Hypertension   . Arthritis     osteoarthritis , kness.  [name] History of pulmonary embolism 01/2002  . Obesity   . Fibrocystic breast disease   . Obstructive sleep apnea     refuses CPAP.  [name] Hyperlipidemia   . Chronic kidney disease     Renal insuffucinecy, cr-1.33 in 10/2005.  [name] Left shoulder pain     2/2 fall  . Neck nodule     not erythematous, movable like cyst located at the base of posterior neck on the left side(not painful), will need follow up for   . Pulmonary nodule     rpt CT sowed resolution infectious vs inflammatory     Review of Systems     Objective:   Physical Exam Filed Vitals:   11/18/13 1012  BP: 128/78  Pulse: 82  Temp: 98.7 F (37.1 C)  TempSrc: Oral  Height: 5' 7.5" (1.715 m)  Weight: 341 lb (154.677 kg)  SpO2: 95%  RA  Gen: Obese but well appearing, no acute distress HEENT: NCAT, PERRL, EOMi, OP clear, neck supple without  masses PULM: CTA B CV: RRR, no mgr, no JVD AB: BS+, soft, nontender, no hsm Ext: warm, no edema, no clubbing, no cyanosis Derm: no rash or skin breakdown Neuro: A&Ox4, CN II-XII intact, strength 5/5 in all 4 extremities        Assessment & Plan:   PULMONARY EMBOLISM, HX OF [name] has been doing well since hospital discharge. Her shortness of breath has completely resolved and she has no signs or symptoms of ongoing pulmonary hypertension at this point. However, she needs to be monitored carefully.  Plan: -She needs lifelong anticoagulation -Repeat echocardiogram in 3 months (see below)  Pulmonary hypertension Certainly some component of her pulmonary hypertension during her recent hospitalization was secondary to the acute pulmonary embolism. However, I think that there is likely some degree of underlying chronic pulmonary hypertension from obstructive sleep apnea. She is currently non-compliant with therapy.  Plan: -I strongly encouraged her today to lose weight -I strongly encouraged her today to use her CPAP machine on a regular basis.  Antiphospholipid syndrome This diagnosis has been assumed based on an elevated cardiolipin IgM antibody as well as a beta-2 glycoprotein IgM elevated antibody. I am not certain that these are specific enough to make this diagnosis.  Further, her mixing study was obtained after heparin was started so it cannot be considered true positive. She would need a hematologist to sort all this out. However, considering the fact that she has had recurrent pulmonary embolism there is no clear role for further defining this as she needs full anticoagulation for life.    Updated Medication List Outpatient Encounter Prescriptions as of 11/18/2013  Medication Sig  . ALPRAZolam (XANAX) 1 MG tablet Take 1 mg by mouth at bedtime as needed for sleep. Take 1.5 tab by mouth everynight.  [name] buPROPion (WELLBUTRIN) 100 MG tablet Take 100 mg by mouth 3 (three) times  daily.  . DULoxetine (CYMBALTA) 60 MG capsule Take 60 mg by mouth daily.  . Multiple Vitamin (MULTIVITAMIN) tablet Take 1 tablet by mouth daily.  [name] omeprazole (PRILOSEC) 20 MG capsule Take 1 capsule (20 mg total) by mouth daily.  . pravastatin (PRAVACHOL) 40 MG tablet Take 1 tablet (40 mg total) by mouth daily.  . Rivaroxaban (XARELTO) 15 MG TABS tablet Take 1 tablet (15 mg total) by mouth 2 (two) times daily with a meal.  . [START ON 11/20/2013] rivaroxaban (XARELTO) 20 MG TABS tablet Take 1 tablet (20 mg total) by mouth daily with supper.  . [DISCONTINUED] amLODipine (NORVASC) 5 MG tablet Take 5 mg by mouth daily.  . [DISCONTINUED] hydrochlorothiazide (HYDRODIURIL) 25 MG tablet Take 1 tablet (25 mg total) by mouth daily.  . [DISCONTINUED] potassium chloride (K-DUR) 10 MEQ tablet Take 1 tablet (10 mEq total) by mouth 2 (two) times daily.

## 2013-11-18 NOTE — Assessment & Plan Note (Signed)
This diagnosis has been assumed based on an elevated cardiolipin IgM antibody as well as a beta-2 glycoprotein IgM elevated antibody. I am not certain that these are specific enough to make this diagnosis. Further, her mixing study was obtained after heparin was started so it cannot be considered true positive. She would need a hematologist to sort all this out. However, considering the fact that she has had recurrent pulmonary embolism there is no clear role for further defining this as she needs full anticoagulation for life.

## 2013-11-18 NOTE — Patient Instructions (Signed)
Keep taking your Xarelto as prescribed in the hospital and change to daily dosing on 9/20 as you have planned  We will arrange a 6 minute walk today and again in 3 months  Use your CPAP machine regularly  Exercise regularly  We will see you back in 3 months or sooner if needed

## 2013-11-21 ENCOUNTER — Telehealth: Payer: Self-pay | Admitting: Dietician

## 2013-11-21 DIAGNOSIS — E669 Obesity, unspecified: Secondary | ICD-10-CM

## 2013-11-21 NOTE — Telephone Encounter (Signed)
Has prediabetes and wants an appoinment sooner than 12-15-13.

## 2013-11-22 ENCOUNTER — Ambulatory Visit
Admission: RE | Admit: 2013-11-22 | Discharge: 2013-11-22 | Disposition: A | Discharge status: Home / Self Care | Payer: Commercial Managed Care - HMO | Source: Ambulatory Visit | Attending: Radiology | Admitting: Radiology

## 2013-11-22 DIAGNOSIS — I2699 Other pulmonary embolism without acute cor pulmonale: Secondary | ICD-10-CM

## 2013-11-22 NOTE — Progress Notes (Signed)
Patient ID: [name], female   DOB: Aug 31, 1945, 68 y.o.   MRN: [id number]   Chief Complaint: Chief Complaint  Patient presents with  . Follow-up    4 wk s/p PE lysis      Referring Physician(s): Morgan,Koreen D  History of Present Illness: [name] is a 68 y.o. female with a history of antiphospholipid antibody syndrome, pulmonary HTN and recurrent PE.  Her most recent PE was submassive and she underwent successful bilateral ultrasound-accelerated catheter directed thrombolysis.  She presents today for her scheduled 1 month follow up evaluation.   She currently feels well and near her baseline physically.  Her primary complaint is stress related to issues in her life outside of her health.  She is looking forward to putting those issues to rest so she can focus on her own health.  She admits to eating poorly recently due to stress.  She has an appointment to see a dietician soon.  On review of systems she notes that she has a mild headache today which is new for her.  She rarely has headaches.  She has taken nothing for the occipital pain and states that it is 'tolerable'.  Denies sudden onset, vision changes, confusion or other neuro related symptoms.   She saw Dr. Kendrick Fries recently and intends to follow his advice to use her BiPAP, lose weight and exercise more regularly.  She is very happy with how much better she feels compared to when she presented with acute PE.   Past Medical History  Diagnosis Date  . Depression     followed by Dr. Evelene Croon  . Diverticulosis of colon   . History of hepatitis B   . Hypertension   . Arthritis     osteoarthritis , kness.  [name] History of pulmonary embolism 01/2002  . Obesity   . Fibrocystic breast disease   . Obstructive sleep apnea     refuses CPAP.  [name] Hyperlipidemia   . Chronic kidney disease     Renal insuffucinecy, cr-1.33 in 10/2005.  [name] Left shoulder pain     2/2 fall  . Neck nodule     not erythematous, movable like cyst  located at the base of posterior neck on the left side(not painful), will need follow up for   . Pulmonary nodule     rpt CT sowed resolution infectious vs inflammatory    Past Surgical History  Procedure Laterality Date  . Gyn surgery      s/p excison of vulvar cyst 10/18/04 by dr. [name]    Allergies: Lisinopril  Medications: Prior to Admission medications   Medication Sig Start Date End Date Taking? Authorizing Provider  ALPRAZolam [name]) 1 MG tablet Take 1 mg by mouth at bedtime as needed for sleep. Take 1.5 tab by mouth everynight.    Historical Provider, MD  buPROPion (WELLBUTRIN) 100 MG tablet Take 100 mg by mouth 3 (three) times daily.    Historical Provider, MD  DULoxetine (CYMBALTA) 60 MG capsule Take 60 mg by mouth daily.    Historical Provider, MD  Multiple Vitamin (MULTIVITAMIN) tablet Take 1 tablet by mouth daily. 07/11/13   [name], MD  omeprazole (PRILOSEC) 20 MG capsule Take 1 capsule (20 mg total) by mouth daily. 07/11/13 07/11/14  [name], MD  pravastatin (PRAVACHOL) 40 MG tablet Take 1 tablet (40 mg total) by mouth daily. 07/11/13   [name], MD  Rivaroxaban (XARELTO) 15 MG TABS tablet Take 1 tablet (  15 mg total) by mouth 2 (two) times daily with a meal. 10/29/13 11/19/13  Annett Gula, MD  rivaroxaban (XARELTO) 20 MG TABS tablet Take 1 tablet (20 mg total) by mouth daily with supper. 11/20/13   [name], MD    Family History  Problem Relation Age of Onset  . Hypertension      family history    History   Social History  . Marital Status: Divorced    Spouse Name: N/A    Number of Children: N/A  . Years of Education: N/A   Social History Main Topics  . Smoking status: Former Smoker -- 1.00 packs/day for 20 years    Types: Cigarettes    Quit date: 03/03/1990  . Smokeless tobacco: Not on file  . Alcohol Use: No  . Drug Use: No  . Sexual Activity: Not on file   Other Topics Concern  . Not on file   Social  History Narrative  . No narrative on file    ECOG Status: 0 - Asymptomatic  Review of Systems: A 12 point ROS discussed and pertinent positives are indicated in the HPI above.  All other systems are negative.  Review of Systems  Vital Signs: BP 157/94  Pulse 76  Temp(Src) 98.3 F (36.8 C) (Oral)  Resp 12  Ht 5' 7.5" (1.715 m)  Wt 342 lb (155.13 kg)  BMI 52.74 kg/m2  SpO2 94%  Physical Exam  Constitutional: She is oriented to person, place, and time. Vital signs are normal. She appears well-nourished. She is cooperative.  HENT:  Head: Normocephalic and atraumatic.  Eyes: No scleral icterus.  Cardiovascular: Normal rate and regular rhythm.   Pulmonary/Chest: Effort normal.  Neurological: She is alert and oriented to person, place, and time.  Skin: Skin is warm and dry.  Psychiatric: She has a normal mood and affect. Her behavior is normal.    Imaging: Dg Chest 2 View  10/26/2013   CLINICAL DATA:  Difficulty breathing and chest tightness  EXAM: CHEST  2 VIEW  COMPARISON:  October 18, 2010  FINDINGS: There is no edema or consolidation. Heart is upper normal in size with pulmonary vascularity within normal limits. No adenopathy. There is degenerative change in the thoracic spine.  IMPRESSION: No edema or consolidation.   Electronically Signed   By: Bretta Bang M.D.   On: 10/26/2013 15:19   Ct Angio Chest Pe W/cm &/or Wo Cm  10/30/2013   CLINICAL DATA:  Pulmonary emboli.  Thrombolysis.  EXAM: CT ANGIOGRAPHY CHEST WITH CONTRAST  TECHNIQUE: Multidetector CT imaging of the chest was performed using the standard protocol during bolus administration of intravenous contrast. Multiplanar CT image reconstructions and MIPs were obtained to evaluate the vascular anatomy.  CONTRAST:  80mL OMNIPAQUE IOHEXOL 350 MG/ML SOLN  COMPARISON:  CT 10/26/2013.  FINDINGS: Thoracic aorta is normal in caliber. No evidence of aneurysm or dissection. Great vessels are normal. Bilateral pulmonary emboli  again noted. This has been significant reduction in clot burden from prior study. Right ventricular/ Left ventricular ratio is 0.8. This is significantly improved from prior study at which time the right ventricular/ left ventricular ratio was 1.4. Cardiomegaly.  Subcarinal and hilar lymph nodes noted. Small sliding hiatal hernia. Thoracic esophagus otherwise unremarkable.  Large airways are patent. Bibasilar atelectasis. 5 mm nodule left lung base difficult to visualize secondary to atelectasis. Nodule appears stable.  Prominent left upper quadrant cyst, most likely renal cyst again noted. Visualized upper abdominal structures otherwise unremarkable.  Thyroid unremarkable. No supraclavicular or axillary adenopathy noted. Diffuse degenerative change thoracic spine.  Review of the MIP images confirms the above findings.  IMPRESSION: 1. Bilateral pulmonary emboli with significant reduction in clot burden status post thrombolysis. Right ventricular /left ventricular ratio ileus 0.8, this is significantly improved. Prior right ventricular/ left ventricular ratio was 1.4. 2. Stable 5 mm nodule left lung base. This is difficult to visualize due to basilar atelectasis.   Electronically Signed   By: Maisie Fus  Register   On: 10/30/2013 10:34   Ct Angio Chest Pe W/cm &/or Wo Cm  10/26/2013   CLINICAL DATA:  Shortness of breath.  EXAM: CT ANGIOGRAPHY CHEST WITH CONTRAST  TECHNIQUE: Multidetector CT imaging of the chest was performed using the standard protocol during bolus administration of intravenous contrast. Multiplanar CT image reconstructions and MIPs were obtained to evaluate the vascular anatomy.  CONTRAST:  100 mL OMNIPAQUE IOHEXOL 350 MG/ML SOLN  COMPARISON:  CT chest 04/24/2008. PA and lateral chest earlier this same day.  FINDINGS: Bilateral pulmonary emboli are identified. A large embolus is seen the right main pulmonary artery. Saddle embolus at the bifurcation of the left main pulmonary artery is also seen with  extensive clot in the descending interlobar arteries bilaterally and their branches. Clot in upper lobe arterial branches is also seen, better on the left. The RV to LV ratio is 1.41 consistent with right heart strain.  There is no pleural or pericardial effusion. Heart size is upper normal. No axillary, hilar or mediastinal lymphadenopathy is identified. The lungs demonstrate mild dependent atelectasis. No evidence of pulmonary infarct is seen. Previously seen 0.5 cm left lower lobe pulmonary nodule is somewhat difficult to visualize on this exam but is thought to be on image 60 and stable in size.  Incidentally imaged upper abdomen shows partial visualization of a large left renal cyst. Imaged intra-abdominal contents are otherwise unremarkable. Thoracic spondylosis is noted. No lytic or sclerotic bony lesion is identified.  Review of the MIP images confirms the above findings.  IMPRESSION: The study is positive for extensive bilateral pulmonary embolus with evidence of right heart strain consistent with at least submassive PE. The presence of right heart strain has been associated with increased risk of morbidity and mortality. Consultation with Pulmonary and Critical Care Medicine is recommended.  0.5 cm left lower lobe pulmonary nodule is stable in size consistent with benignity.  Critical Value/emergent results were called by telephone at the time of interpretation on 10/26/2013 at 5:50 pm to [facility] who verbally acknowledged these results.   Electronically Signed   By: Drusilla Kanner M.D.   On: 10/26/2013 17:52   Ir Angiogram Pulmonary Bilateral Selective  10/27/2013   CLINICAL DATA:  68 year old female with large volume bilateral acute pulmonary emboli and evidence of right heart strain both by CT and echocardiography consistent with sub massive PE. She presents for initiation of bilateral ultrasound accelerated catheter directed thrombolysis (USAT).  EXAM: IR INFUSION THROMBOL ARTERIAL  INITIAL (MS); IR ULTRASOUND GUIDANCE VASC ACCESS RIGHT; ADDITIONAL ARTERIOGRAPHY; BILATERAL PULMONARY ARTERIOGRAPHY  Date: 10/27/2013  PROCEDURE: 1. Ultrasound-guided puncture of the right common femoral vein 2. Second ultrasound-guided puncture of the right common femoral vein 3. Catheterization of the right main pulmonary artery with limited arteriogram 4. Catheterization of the right lower lobe pulmonary artery with arteriogram 5. Catheterization of the left main pulmonary artery with arteriogram 6. Catheterization of the left lower lobe pulmonary artery with arteriogram 7. Placement of bilateral 12 cm EKOS infusion catheters  8. Initiation of pulmonary arterial lysis Interventional Radiologist:  Sterling Big, MD  ANESTHESIA/SEDATION: 100 mcg fentanyl administered intravenously  Total Moderate Sedation Time  45 minutes minutes.  FLUOROSCOPY TIME:  13 minutes 42 seconds.  CONTRAST:  15mL OMNIPAQUE IOHEXOL 300 MG/ML  SOLN  TECHNIQUE: Informed consent was obtained from the patient following explanation of the procedure, risks, benefits and alternatives. The patient understands, agrees and consents for the procedure. All questions were addressed. A time out was performed.  Maximal barrier sterile technique utilized including caps, mask, sterile gowns, sterile gloves, large sterile drape, hand hygiene, and Betadine skin prep.  The right groin was interrogated with ultrasound. The right common femoral vein is patent and compressible. No evidence of a DVT. Image was obtained and stored for the medical record. Local anesthesia was attained by infiltration with 1% lidocaine. A small dermatotomy was made. Under real-time sonographic guidance, a 21 gauge micropuncture needle was used to puncture the common femoral vein. With the aid of a 5 [place] transitional micro sheath the 0.018 wire was exchanged for a Bentson wire and a 6 [place] vascular sheath was advanced into the vein.  Immediately above the initial access, a  second ultrasound-guided puncture the common femoral vein was obtained in the same manner. In similar fashion, a second 6 [place] vascular sheath was advanced into the right common femoral vein.  Using a C2 Cobra catheter and Bentson wire, the pulmonary arterial trunk was catheterized. Pressure measurements were obtained. The main pulmonary arterial pressure is 70/30 (mean 49) mmHg.  The catheter was then advanced into the right main pulmonary artery. A right pulmonary arteriogram was performed. Thrombus is present within the right main pulmonary artery extending into the right upper lobe pulmonary artery. Additionally, there is a filling defect in the proximal left lower lobe pulmonary artery. Overall, there is very limited perfusion of both the right upper and lower lobe pulmonary arteries.  The catheter was navigated into the right lower lobe pulmonary artery in a second hand injection confirmed catheter location. A Rosen wire was advanced through the catheter. Through the second 6 [place] vascular sheath a 100 cm angled catheter and a stiff Glidewire were used to select the left main pulmonary artery. Pulmonary arteriography was performed. The filling defects are present within multiple segmental branches of the left lower lobe pulmonary artery. The catheter was then advanced into the left lower lobe pulmonary artery and a second pulmonary arteriogram was performed confirming catheter location. A second Rosen wire was positioned in the left lower lobe pulmonary artery.  12 cm length EKOS catheters were then advanced over the Rosen wires and positioned in both the right and left lower lobe pulmonary arteries. The catheters extend into the main pulmonary arteries bilaterally.  The catheters were flushed and bilateral USAT was initiated with 1 mg/hr TPA infused into each catheter.  COMPLICATIONS: None immediate  IMPRESSION: 1. Right worse than left bilateral sub massive pulmonary emboli. 2. Critical pulmonary  arterial hypertension. The main pulmonary arterial mean pressures 49 mm Hg. 3. Successful initiation of bilateral ultrasound-accelerated pulmonary arterial thrombolysis. Signed,  Sterling Big, MD  [facility]  [facility] Radiology   Electronically Signed   By: Malachy Moan M.D.   On: 10/27/2013 16:08   Ir Angiogram Selective Each Additional Vessel  10/27/2013   CLINICAL DATA:  68 year old female with large volume bilateral acute pulmonary emboli and evidence of right heart strain both by CT and echocardiography consistent with sub massive PE.  She presents for initiation of bilateral ultrasound accelerated catheter directed thrombolysis (USAT).  EXAM: IR INFUSION THROMBOL ARTERIAL INITIAL (MS); IR ULTRASOUND GUIDANCE VASC ACCESS RIGHT; ADDITIONAL ARTERIOGRAPHY; BILATERAL PULMONARY ARTERIOGRAPHY  Date: 10/27/2013  PROCEDURE: 1. Ultrasound-guided puncture of the right common femoral vein 2. Second ultrasound-guided puncture of the right common femoral vein 3. Catheterization of the right main pulmonary artery with limited arteriogram 4. Catheterization of the right lower lobe pulmonary artery with arteriogram 5. Catheterization of the left main pulmonary artery with arteriogram 6. Catheterization of the left lower lobe pulmonary artery with arteriogram 7. Placement of bilateral 12 cm EKOS infusion catheters 8. Initiation of pulmonary arterial lysis Interventional Radiologist:  Sterling Big, MD  ANESTHESIA/SEDATION: 100 mcg fentanyl administered intravenously  Total Moderate Sedation Time  45 minutes minutes.  FLUOROSCOPY TIME:  13 minutes 42 seconds.  CONTRAST:  15mL OMNIPAQUE IOHEXOL 300 MG/ML  SOLN  TECHNIQUE: Informed consent was obtained from the patient following explanation of the procedure, risks, benefits and alternatives. The patient understands, agrees and consents for the procedure. All questions were addressed. A time out was performed.  Maximal  barrier sterile technique utilized including caps, mask, sterile gowns, sterile gloves, large sterile drape, hand hygiene, and Betadine skin prep.  The right groin was interrogated with ultrasound. The right common femoral vein is patent and compressible. No evidence of a DVT. Image was obtained and stored for the medical record. Local anesthesia was attained by infiltration with 1% lidocaine. A small dermatotomy was made. Under real-time sonographic guidance, a 21 gauge micropuncture needle was used to puncture the common femoral vein. With the aid of a 5 [place] transitional micro sheath the 0.018 wire was exchanged for a Bentson wire and a 6 [place] vascular sheath was advanced into the vein.  Immediately above the initial access, a second ultrasound-guided puncture the common femoral vein was obtained in the same manner. In similar fashion, a second 6 [place] vascular sheath was advanced into the right common femoral vein.  Using a C2 Cobra catheter and Bentson wire, the pulmonary arterial trunk was catheterized. Pressure measurements were obtained. The main pulmonary arterial pressure is 70/30 (mean 49) mmHg.  The catheter was then advanced into the right main pulmonary artery. A right pulmonary arteriogram was performed. Thrombus is present within the right main pulmonary artery extending into the right upper lobe pulmonary artery. Additionally, there is a filling defect in the proximal left lower lobe pulmonary artery. Overall, there is very limited perfusion of both the right upper and lower lobe pulmonary arteries.  The catheter was navigated into the right lower lobe pulmonary artery in a second hand injection confirmed catheter location. A Rosen wire was advanced through the catheter. Through the second 6 [place] vascular sheath a 100 cm angled catheter and a stiff Glidewire were used to select the left main pulmonary artery. Pulmonary arteriography was performed. The filling defects are present within  multiple segmental branches of the left lower lobe pulmonary artery. The catheter was then advanced into the left lower lobe pulmonary artery and a second pulmonary arteriogram was performed confirming catheter location. A second Rosen wire was positioned in the left lower lobe pulmonary artery.  12 cm length EKOS catheters were then advanced over the Rosen wires and positioned in both the right and left lower lobe pulmonary arteries. The catheters extend into the main pulmonary arteries bilaterally.  The catheters were flushed and bilateral USAT was initiated with 1 mg/hr TPA infused into each catheter.  COMPLICATIONS: None  immediate  IMPRESSION: 1. Right worse than left bilateral sub massive pulmonary emboli. 2. Critical pulmonary arterial hypertension. The main pulmonary arterial mean pressures 49 mm Hg. 3. Successful initiation of bilateral ultrasound-accelerated pulmonary arterial thrombolysis. Signed,  Sterling Big, MD  [facility]  [facility] Radiology   Electronically Signed   By: Malachy Moan M.D.   On: 10/27/2013 16:08   Ir Angiogram Selective Each Additional Vessel  10/27/2013   CLINICAL DATA:  68 year old female with large volume bilateral acute pulmonary emboli and evidence of right heart strain both by CT and echocardiography consistent with sub massive PE. She presents for initiation of bilateral ultrasound accelerated catheter directed thrombolysis (USAT).  EXAM: IR INFUSION THROMBOL ARTERIAL INITIAL (MS); IR ULTRASOUND GUIDANCE VASC ACCESS RIGHT; ADDITIONAL ARTERIOGRAPHY; BILATERAL PULMONARY ARTERIOGRAPHY  Date: 10/27/2013  PROCEDURE: 1. Ultrasound-guided puncture of the right common femoral vein 2. Second ultrasound-guided puncture of the right common femoral vein 3. Catheterization of the right main pulmonary artery with limited arteriogram 4. Catheterization of the right lower lobe pulmonary artery with arteriogram 5. Catheterization of the left  main pulmonary artery with arteriogram 6. Catheterization of the left lower lobe pulmonary artery with arteriogram 7. Placement of bilateral 12 cm EKOS infusion catheters 8. Initiation of pulmonary arterial lysis Interventional Radiologist:  Sterling Big, MD  ANESTHESIA/SEDATION: 100 mcg fentanyl administered intravenously  Total Moderate Sedation Time  45 minutes minutes.  FLUOROSCOPY TIME:  13 minutes 42 seconds.  CONTRAST:  15mL OMNIPAQUE IOHEXOL 300 MG/ML  SOLN  TECHNIQUE: Informed consent was obtained from the patient following explanation of the procedure, risks, benefits and alternatives. The patient understands, agrees and consents for the procedure. All questions were addressed. A time out was performed.  Maximal barrier sterile technique utilized including caps, mask, sterile gowns, sterile gloves, large sterile drape, hand hygiene, and Betadine skin prep.  The right groin was interrogated with ultrasound. The right common femoral vein is patent and compressible. No evidence of a DVT. Image was obtained and stored for the medical record. Local anesthesia was attained by infiltration with 1% lidocaine. A small dermatotomy was made. Under real-time sonographic guidance, a 21 gauge micropuncture needle was used to puncture the common femoral vein. With the aid of a 5 [place] transitional micro sheath the 0.018 wire was exchanged for a Bentson wire and a 6 [place] vascular sheath was advanced into the vein.  Immediately above the initial access, a second ultrasound-guided puncture the common femoral vein was obtained in the same manner. In similar fashion, a second 6 [place] vascular sheath was advanced into the right common femoral vein.  Using a C2 Cobra catheter and Bentson wire, the pulmonary arterial trunk was catheterized. Pressure measurements were obtained. The main pulmonary arterial pressure is 70/30 (mean 49) mmHg.  The catheter was then advanced into the right main pulmonary artery. A right  pulmonary arteriogram was performed. Thrombus is present within the right main pulmonary artery extending into the right upper lobe pulmonary artery. Additionally, there is a filling defect in the proximal left lower lobe pulmonary artery. Overall, there is very limited perfusion of both the right upper and lower lobe pulmonary arteries.  The catheter was navigated into the right lower lobe pulmonary artery in a second hand injection confirmed catheter location. A Rosen wire was advanced through the catheter. Through the second 6 [place] vascular sheath a 100 cm angled catheter and a stiff Glidewire were used to select the left main pulmonary artery. Pulmonary arteriography was  performed. The filling defects are present within multiple segmental branches of the left lower lobe pulmonary artery. The catheter was then advanced into the left lower lobe pulmonary artery and a second pulmonary arteriogram was performed confirming catheter location. A second Rosen wire was positioned in the left lower lobe pulmonary artery.  12 cm length EKOS catheters were then advanced over the Rosen wires and positioned in both the right and left lower lobe pulmonary arteries. The catheters extend into the main pulmonary arteries bilaterally.  The catheters were flushed and bilateral USAT was initiated with 1 mg/hr TPA infused into each catheter.  COMPLICATIONS: None immediate  IMPRESSION: 1. Right worse than left bilateral sub massive pulmonary emboli. 2. Critical pulmonary arterial hypertension. The main pulmonary arterial mean pressures 49 mm Hg. 3. Successful initiation of bilateral ultrasound-accelerated pulmonary arterial thrombolysis. Signed,  Sterling Big, MD  [facility]  [facility] Radiology   Electronically Signed   By: Malachy Moan M.D.   On: 10/27/2013 16:08   Ir US Guide Vasc Access Right  10/27/2013   CLINICAL DATA:  68 year old female with large volume bilateral acute  pulmonary emboli and evidence of right heart strain both by CT and echocardiography consistent with sub massive PE. She presents for initiation of bilateral ultrasound accelerated catheter directed thrombolysis (USAT).  EXAM: IR INFUSION THROMBOL ARTERIAL INITIAL (MS); IR ULTRASOUND GUIDANCE VASC ACCESS RIGHT; ADDITIONAL ARTERIOGRAPHY; BILATERAL PULMONARY ARTERIOGRAPHY  Date: 10/27/2013  PROCEDURE: 1. Ultrasound-guided puncture of the right common femoral vein 2. Second ultrasound-guided puncture of the right common femoral vein 3. Catheterization of the right main pulmonary artery with limited arteriogram 4. Catheterization of the right lower lobe pulmonary artery with arteriogram 5. Catheterization of the left main pulmonary artery with arteriogram 6. Catheterization of the left lower lobe pulmonary artery with arteriogram 7. Placement of bilateral 12 cm EKOS infusion catheters 8. Initiation of pulmonary arterial lysis Interventional Radiologist:  Sterling Big, MD  ANESTHESIA/SEDATION: 100 mcg fentanyl administered intravenously  Total Moderate Sedation Time  45 minutes minutes.  FLUOROSCOPY TIME:  13 minutes 42 seconds.  CONTRAST:  15mL OMNIPAQUE IOHEXOL 300 MG/ML  SOLN  TECHNIQUE: Informed consent was obtained from the patient following explanation of the procedure, risks, benefits and alternatives. The patient understands, agrees and consents for the procedure. All questions were addressed. A time out was performed.  Maximal barrier sterile technique utilized including caps, mask, sterile gowns, sterile gloves, large sterile drape, hand hygiene, and Betadine skin prep.  The right groin was interrogated with ultrasound. The right common femoral vein is patent and compressible. No evidence of a DVT. Image was obtained and stored for the medical record. Local anesthesia was attained by infiltration with 1% lidocaine. A small dermatotomy was made. Under real-time sonographic guidance, a 21 gauge micropuncture  needle was used to puncture the common femoral vein. With the aid of a 5 [place] transitional micro sheath the 0.018 wire was exchanged for a Bentson wire and a 6 [place] vascular sheath was advanced into the vein.  Immediately above the initial access, a second ultrasound-guided puncture the common femoral vein was obtained in the same manner. In similar fashion, a second 6 [place] vascular sheath was advanced into the right common femoral vein.  Using a C2 Cobra catheter and Bentson wire, the pulmonary arterial trunk was catheterized. Pressure measurements were obtained. The main pulmonary arterial pressure is 70/30 (mean 49) mmHg.  The catheter was then advanced into the right main pulmonary artery. A right  pulmonary arteriogram was performed. Thrombus is present within the right main pulmonary artery extending into the right upper lobe pulmonary artery. Additionally, there is a filling defect in the proximal left lower lobe pulmonary artery. Overall, there is very limited perfusion of both the right upper and lower lobe pulmonary arteries.  The catheter was navigated into the right lower lobe pulmonary artery in a second hand injection confirmed catheter location. A Rosen wire was advanced through the catheter. Through the second 6 [place] vascular sheath a 100 cm angled catheter and a stiff Glidewire were used to select the left main pulmonary artery. Pulmonary arteriography was performed. The filling defects are present within multiple segmental branches of the left lower lobe pulmonary artery. The catheter was then advanced into the left lower lobe pulmonary artery and a second pulmonary arteriogram was performed confirming catheter location. A second Rosen wire was positioned in the left lower lobe pulmonary artery.  12 cm length EKOS catheters were then advanced over the Rosen wires and positioned in both the right and left lower lobe pulmonary arteries. The catheters extend into the main pulmonary arteries  bilaterally.  The catheters were flushed and bilateral USAT was initiated with 1 mg/hr TPA infused into each catheter.  COMPLICATIONS: None immediate  IMPRESSION: 1. Right worse than left bilateral sub massive pulmonary emboli. 2. Critical pulmonary arterial hypertension. The main pulmonary arterial mean pressures 49 mm Hg. 3. Successful initiation of bilateral ultrasound-accelerated pulmonary arterial thrombolysis. Signed,  Sterling Big, MD  [facility]  [facility]   Electronically Signed   By: Malachy Moan M.D.   On: 10/27/2013 16:08   Ir Infusion Thrombol Arterial Initial (ms)  10/27/2013   CLINICAL DATA:  68 year old female with large volume bilateral acute pulmonary emboli and evidence of right heart strain both by CT and echocardiography consistent with sub massive PE. She presents for initiation of bilateral ultrasound accelerated catheter directed thrombolysis (USAT).  EXAM: IR INFUSION THROMBOL ARTERIAL INITIAL (MS); IR ULTRASOUND GUIDANCE VASC ACCESS RIGHT; ADDITIONAL ARTERIOGRAPHY; BILATERAL PULMONARY ARTERIOGRAPHY  Date: 10/27/2013  PROCEDURE: 1. Ultrasound-guided puncture of the right common femoral vein 2. Second ultrasound-guided puncture of the right common femoral vein 3. Catheterization of the right main pulmonary artery with limited arteriogram 4. Catheterization of the right lower lobe pulmonary artery with arteriogram 5. Catheterization of the left main pulmonary artery with arteriogram 6. Catheterization of the left lower lobe pulmonary artery with arteriogram 7. Placement of bilateral 12 cm EKOS infusion catheters 8. Initiation of pulmonary arterial lysis Interventional Radiologist:  Sterling Big, MD  ANESTHESIA/SEDATION: 100 mcg fentanyl administered intravenously  Total Moderate Sedation Time  45 minutes minutes.  FLUOROSCOPY TIME:  13 minutes 42 seconds.  CONTRAST:  15mL OMNIPAQUE IOHEXOL 300 MG/ML  SOLN  TECHNIQUE:  Informed consent was obtained from the patient following explanation of the procedure, risks, benefits and alternatives. The patient understands, agrees and consents for the procedure. All questions were addressed. A time out was performed.  Maximal barrier sterile technique utilized including caps, mask, sterile gowns, sterile gloves, large sterile drape, hand hygiene, and Betadine skin prep.  The right groin was interrogated with ultrasound. The right common femoral vein is patent and compressible. No evidence of a DVT. Image was obtained and stored for the medical record. Local anesthesia was attained by infiltration with 1% lidocaine. A small dermatotomy was made. Under real-time sonographic guidance, a 21 gauge micropuncture needle was used to puncture the common femoral vein. With the aid  of a 5 [place] transitional micro sheath the 0.018 wire was exchanged for a Bentson wire and a 6 [place] vascular sheath was advanced into the vein.  Immediately above the initial access, a second ultrasound-guided puncture the common femoral vein was obtained in the same manner. In similar fashion, a second 6 [place] vascular sheath was advanced into the right common femoral vein.  Using a C2 Cobra catheter and Bentson wire, the pulmonary arterial trunk was catheterized. Pressure measurements were obtained. The main pulmonary arterial pressure is 70/30 (mean 49) mmHg.  The catheter was then advanced into the right main pulmonary artery. A right pulmonary arteriogram was performed. Thrombus is present within the right main pulmonary artery extending into the right upper lobe pulmonary artery. Additionally, there is a filling defect in the proximal left lower lobe pulmonary artery. Overall, there is very limited perfusion of both the right upper and lower lobe pulmonary arteries.  The catheter was navigated into the right lower lobe pulmonary artery in a second hand injection confirmed catheter location. A Rosen wire was advanced  through the catheter. Through the second 6 [place] vascular sheath a 100 cm angled catheter and a stiff Glidewire were used to select the left main pulmonary artery. Pulmonary arteriography was performed. The filling defects are present within multiple segmental branches of the left lower lobe pulmonary artery. The catheter was then advanced into the left lower lobe pulmonary artery and a second pulmonary arteriogram was performed confirming catheter location. A second Rosen wire was positioned in the left lower lobe pulmonary artery.  12 cm length EKOS catheters were then advanced over the Rosen wires and positioned in both the right and left lower lobe pulmonary arteries. The catheters extend into the main pulmonary arteries bilaterally.  The catheters were flushed and bilateral USAT was initiated with 1 mg/hr TPA infused into each catheter.  COMPLICATIONS: None immediate  IMPRESSION: 1. Right worse than left bilateral sub massive pulmonary emboli. 2. Critical pulmonary arterial hypertension. The main pulmonary arterial mean pressures 49 mm Hg. 3. Successful initiation of bilateral ultrasound-accelerated pulmonary arterial thrombolysis. Signed,  Sterling Big, MD  [facility]  [facility]   Electronically Signed   By: Malachy Moan M.D.   On: 10/27/2013 16:08   Ir Rande Lawman F/u Eval Art/ven Final Day (ms)  10/28/2013   INDICATION: 68 year old female with history of large volume bilateral pulmonary embolism and evidence of right heart strain on both CT and echo cardial echocardiography consistent with sub massive pulmonary embolism. Post initiation of bilateral ultrasound accelerated catheter directed thrombolysis (USAT) on 10/27/2013. Patient presents today for follow-up examination.  EXAM: 1. ARTERIOGRAPHY OF THE MAIN PULMONARY ARTERY 2. FLUOROSCOPIC GUIDED EVALUATION OF PULMONARY ARTERIAL PRESSURES  COMPARISON:  Chest CTA - 09/26/2013  MEDICATIONS: None   CONTRAST:  30 cc OMNIPAQUE IOHEXOL 300 MG/ML  SOLN  FLUOROSCOPY TIME:  4 minutes.  COMPLICATIONS: None immediate  TECHNIQUE: Informed written consent was obtained from the patient after a discussion of the risks, benefits and alternatives to treatment. Questions regarding the procedure were encouraged and answered. A timeout was performed prior to the initiation of the procedure.  Preprocedural spot fluoroscopic image was obtained.  The right pulmonary arterial infusion catheter was cannulated within exchange length Bentson wire and exchanged for a pigtail catheter. The pigtail catheter was utilized to obtain pulmonary arterial pressures at the level of the right pulmonary artery.  The left pulmonary arterial infusion catheter was then exchanged for the pigtail catheter and  utilized to obtain pulmonary arterial pressures at the level of the left pulmonary artery appear the pigtail catheter was then retracted to the level of the main pulmonary artery. Limited main pulmonary arteriogram was performed to confirm appropriate positioning and pulmonary arterial pressure was acquired at the level of the main pulmonary artery.  At this point, the procedure was terminated. All wires catheters and sheaths were removed from the patient. Hemostasis was achieved at the right groin access site with manual compression. A dressing was placed. The patient tolerated the procedure well without immediate postprocedural complication.  FINDINGS: A preprocedural spot fluoroscopic image demonstrates unchanged positioning of the bilateral pulmonary arterial infusion catheters. Pressure measurements were obtained as detailed above. A limited main pulmonary arteriogram was performed to confirm appropriate positioning during the measurements of the level of the main pulmonary artery.  Acquired pulmonary arterial pressure measurements:  Right main pulmonary artery - 61/20 ; mean - 38  Left main pulmonary artery - 63/29 ; mean - 43  Main  pulmonary artery - 62/13; mean - 36 (previously: 73/30; mean - 49).  IMPRESSION: Technically successful bilateral ultrasound accelerated pulmonary arterial thrombolysis with reduction of the mean main pulmonary arterial pressure from 49 mm Hg to 36 mm Hg (13 mm Hg reduction in mean main pulmonary arterial pressure).  PLAN: - Patient will continue on a heparin drip with event conversion to long-term anti coagulation as directed by PCCM.  - Will obtain follow-up chest CTA and cardiac echo approximately 48 hours after the beginning of the USAT (tomorrow afternoon).  Above findings discussed with Dr. Sung Amabile at the completion of the procedure.   Electronically Signed   By: Simonne Come M.D.   On: 10/28/2013 12:30    Labs: Lab Results  Component Value Date   WBC 6.4 10/31/2013   HCT 39.5 10/31/2013   MCV 81.3 10/31/2013   PLT 175 10/31/2013   NA 139 10/31/2013   K 3.9 10/31/2013   CL 102 10/31/2013   CO2 23 10/31/2013   GLUCOSE 101* 10/31/2013   BUN 12 10/31/2013   CREATININE 0.98 10/31/2013   CALCIUM 8.9 10/31/2013   PROT 7.2 10/27/2013   ALBUMIN 3.4* 10/27/2013   AST 19 10/27/2013   ALT 14 10/27/2013   ALKPHOS 90 10/27/2013   BILITOT 0.4 10/27/2013   GFRNONAA 59* 10/31/2013   GFRAA 68* 10/31/2013   INR 1.21 10/28/2013    Assessment and Plan:  Submassive PE - Doing clinically very well. She is currently at or near her baseline prior to her PE - Continue Xarelto, will need lifelong anticoagulation  - Endorsed weight loss, healthy diet and exercise - She will continue to follow-up with Dr. Kendrick Fries  Headache - New today, no worrisome features.  Only reason for clinical concern at all is the fact that she is on Xarelto. - Recommended OTC tylenol - If persists or worsens, she may require head CT.  She will call if she experiences any changes.    Signed,  Sterling Big, MD    I spent a total of 20 minutes face to face in clinical consultation, greater than 50% of which was  counseling/coordinating care for post submassive PE follow-up.

## 2013-11-22 NOTE — Addendum Note (Signed)
Addended by: Blanch Media A on: 11/22/2013 11:27 AM   Modules accepted: Orders

## 2013-11-22 NOTE — Telephone Encounter (Signed)
Patient scheduled visit for 11/25/13 at 9:30 AM  To learn more about meal planning to prevent diabetes.

## 2013-11-23 NOTE — Telephone Encounter (Signed)
Done

## 2013-11-25 ENCOUNTER — Ambulatory Visit (INDEPENDENT_AMBULATORY_CARE_PROVIDER_SITE_OTHER): Payer: Commercial Managed Care - HMO | Admitting: Dietician

## 2013-11-25 ENCOUNTER — Encounter: Payer: Self-pay | Admitting: Dietician

## 2013-11-25 VITALS — Ht 67.5 in | Wt 345.1 lb

## 2013-11-25 DIAGNOSIS — E669 Obesity, unspecified: Secondary | ICD-10-CM

## 2013-11-25 DIAGNOSIS — Z7189 Other specified counseling: Secondary | ICD-10-CM

## 2013-11-25 NOTE — Patient Instructions (Signed)
Please try to eat 20-50% of your calories (CALORIES come from carbs, fats mostly and protein)  twice a day.  Please also read the first 7-9 pages in the Preventing diabetes book.  Feel free to use MyChart to email any questions or concerns.   Lupita Leash [phone]

## 2013-11-25 NOTE — Progress Notes (Signed)
Start time: 9:35 AM  End time : 10:30 AM  Health Coaching Progress Note:  Patient Identified Concern: Patient was recently diagnosed with  prediabetes while in the hospital- A1C 6.2%, before that 5.5% and 6.3%) Stage of Change Patient Is In:  Ready, change in progress, has lost weight (~ 40#) since 2008 Patient Reported Barriers:  Emotional eating, major depression, does not cook much, arthritis of knees, enjoys internet and sleeping Patient Reported Perceived Benefits:  Preventing diabetes,  Patient Reports Self-Efficacy:   Not determined today Behavior Change Supports:  [facility], has dog.  Goals:  See patient instructions, suggest small, doable goals as patient is ready, patient reports one large meal a day and uses carbs to 'help her sleep'   Patient Education: patient many questions about meal planning and nutrition answered. Provided her with packet on Diabetes Prevention Program and information on prediabetes. Reads labels for sodium and sugar. Prefers support groups to therapists and has attended Overeaters Anonymous in past.

## 2013-11-28 ENCOUNTER — Other Ambulatory Visit (HOSPITAL_COMMUNITY): Payer: Commercial Managed Care - HMO

## 2013-11-29 ENCOUNTER — Other Ambulatory Visit: Payer: Commercial Managed Care - HMO

## 2013-12-07 ENCOUNTER — Telehealth: Payer: Self-pay | Admitting: *Deleted

## 2013-12-07 NOTE — Telephone Encounter (Signed)
Call from Br[place], [facility] - # [phone] Pt reported that she had a fall yesterday, while picking up some papers.   She denies any injury except a bruise on left shin.  This is Financial plannerYI

## 2013-12-08 NOTE — Telephone Encounter (Signed)
Noted. Thanks.

## 2013-12-13 ENCOUNTER — Encounter: Payer: Self-pay | Admitting: Internal Medicine

## 2013-12-15 ENCOUNTER — Ambulatory Visit: Payer: Commercial Managed Care - HMO | Admitting: Dietician

## 2013-12-15 ENCOUNTER — Ambulatory Visit: Payer: Commercial Managed Care - HMO | Admitting: Internal Medicine

## 2013-12-15 ENCOUNTER — Ambulatory Visit (INDEPENDENT_AMBULATORY_CARE_PROVIDER_SITE_OTHER): Payer: Commercial Managed Care - HMO | Admitting: Internal Medicine

## 2013-12-15 ENCOUNTER — Ambulatory Visit (INDEPENDENT_AMBULATORY_CARE_PROVIDER_SITE_OTHER): Payer: Commercial Managed Care - HMO | Admitting: Dietician

## 2013-12-15 VITALS — BP 139/77 | HR 84 | Temp 98.4°F | Wt 351.3 lb

## 2013-12-15 DIAGNOSIS — D6861 Antiphospholipid syndrome: Secondary | ICD-10-CM

## 2013-12-15 DIAGNOSIS — F329 Major depressive disorder, single episode, unspecified: Secondary | ICD-10-CM

## 2013-12-15 DIAGNOSIS — E669 Obesity, unspecified: Secondary | ICD-10-CM

## 2013-12-15 DIAGNOSIS — Z7189 Other specified counseling: Secondary | ICD-10-CM

## 2013-12-15 DIAGNOSIS — M170 Bilateral primary osteoarthritis of knee: Secondary | ICD-10-CM

## 2013-12-15 DIAGNOSIS — E785 Hyperlipidemia, unspecified: Secondary | ICD-10-CM

## 2013-12-15 DIAGNOSIS — G4733 Obstructive sleep apnea (adult) (pediatric): Secondary | ICD-10-CM

## 2013-12-15 DIAGNOSIS — Z86718 Personal history of other venous thrombosis and embolism: Secondary | ICD-10-CM

## 2013-12-15 DIAGNOSIS — F322 Major depressive disorder, single episode, severe without psychotic features: Secondary | ICD-10-CM

## 2013-12-15 DIAGNOSIS — Z0000 Encounter for general adult medical examination without abnormal findings: Secondary | ICD-10-CM

## 2013-12-15 DIAGNOSIS — I10 Essential (primary) hypertension: Secondary | ICD-10-CM

## 2013-12-15 DIAGNOSIS — I2699 Other pulmonary embolism without acute cor pulmonale: Secondary | ICD-10-CM

## 2013-12-15 LAB — CBC
HCT: 43.6 % (ref 36.0–46.0)
Hemoglobin: 14.6 g/dL (ref 12.0–15.0)
MCH: 26.6 pg (ref 26.0–34.0)
MCHC: 33.5 g/dL (ref 30.0–36.0)
MCV: 79.4 fL (ref 78.0–100.0)
PLATELETS: 219 10*3/uL (ref 150–400)
RBC: 5.49 MIL/uL — ABNORMAL HIGH (ref 3.87–5.11)
RDW: 15.6 % — AB (ref 11.5–15.5)
WBC: 7.5 10*3/uL (ref 4.0–10.5)

## 2013-12-15 NOTE — Assessment & Plan Note (Signed)
Dexa today. Requested Vit D level - ordered but explained insurance might not cover. MMG ordered.

## 2013-12-15 NOTE — Assessment & Plan Note (Signed)
She cont on her Xarelto and has no complications. Will check CBC for HgB. Still + DOE but unclear if related to obesity, Pul HTN, or prior PE. Seeing pul and ECHO to reassess pul HTN in Nov.

## 2013-12-15 NOTE — Assessment & Plan Note (Signed)
This was worse when her weight was up. Now has lost some weight and isn't as bad. Takes no meds.
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
## 2013-12-15 NOTE — Assessment & Plan Note (Signed)
On prava 40 and LDL was 110 in Feb. No need to recheck. 10 yr CV risk is < 10%. LDL goal likely 130 - 160.

## 2013-12-15 NOTE — Progress Notes (Signed)
   Subjective:    Patient ID: [name], female    DOB: [birth year], 68 y.o.   MRN: [id number]  Shortness of Breath Pertinent negatives include no abdominal pain, chest pain or leg swelling.    Please see the A&P for the status of the pt's chronic medical problems.  Review of Systems  Respiratory: Positive for shortness of breath.   Cardiovascular: Negative for chest pain and leg swelling.  Gastrointestinal: Negative for abdominal pain and blood in stool.  Neurological: Positive for light-headedness.       3 recent falls  Psychiatric/Behavioral: Positive for dysphoric mood. Negative for self-injury.       Objective:   Physical Exam  Constitutional: She is oriented to person, place, and time. She appears well-developed and well-nourished. No distress.  HENT:  Head: Normocephalic and atraumatic.  Right Ear: External ear normal.  Left Ear: External ear normal.  Nose: Nose normal.  Eyes: Conjunctivae and EOM are normal.  Cardiovascular: Normal rate, regular rhythm and normal heart sounds.   Pulmonary/Chest: Effort normal and breath sounds normal.  Musculoskeletal: Normal range of motion. She exhibits no edema.  Trace non pitting edema B  Neurological: She is alert and oriented to person, place, and time.  Slight tremor of lip, seemed to be related to anxiety. Slight tearfulness when talking about depression  Skin: Skin is warm and dry. She is not diaphoretic.  Psychiatric: She has a normal mood and affect. Her behavior is normal. Judgment normal.          Assessment & Plan:

## 2013-12-15 NOTE — Patient Instructions (Signed)
1. I will mail you your test results 2. See me in 3-5 months 3. If possible, check your Blood pressure once or twice a month and let me know if it is greater than 140/90

## 2013-12-15 NOTE — Assessment & Plan Note (Signed)
Wt Readings from Last 3 Encounters:  12/15/13 351 lb 4.8 oz (159.349 kg)  11/25/13 345 lb 1.6 oz (156.536 kg)  11/22/13 342 lb (155.13 kg)    Overall, lost 25 lbs since 2014 but still struggling. Met with Donna today. 

## 2013-12-15 NOTE — Assessment & Plan Note (Signed)
She has basic understanding of this. I likely will go ahead and retest in case she develops bleeding tendencies and the risk / benefit ratio bc less clear.

## 2013-12-15 NOTE — Assessment & Plan Note (Signed)
On CPAP. ?

## 2013-12-15 NOTE — Assessment & Plan Note (Signed)
Her repeat BP is at goal today. She is off her Norvasc and HCTZ which is surprising bc I thought we had D/C'd her on it. One of her 3 falls came after leaning over and her nurse checked the BP and it was 110/64. I am leaving her off her BP meds now and if it increases at 2 month check up, would restart lower dose Norvasc or HCTZ, but only one.

## 2013-12-16 ENCOUNTER — Encounter: Payer: Self-pay | Admitting: Dietician

## 2013-12-16 LAB — VITAMIN D 25 HYDROXY (VIT D DEFICIENCY, FRACTURES): Vit D, 25-Hydroxy: 28 ng/mL — ABNORMAL LOW (ref 30–89)

## 2013-12-16 NOTE — Progress Notes (Signed)
Health Coaching Progress Note:  Start time: 10:35 AM  End time : 11:30 AM Patient did not eat two smaller meals or read book on Diabetes prevention program yet. Patient Identified Concern: She asked many astute nutrition questions and discussed her struggle with depression and emotional eating Stage of Change Patient Is In:  Ready, change in progress, weight increased,  patient is changing her attitude and outlook on her problems.  Patient Reported Barriers:  Emotional eating, major depression, does not cook much, arthritis of knees, enjoys internet and sleeping Patient Reported Perceived Benefits:  Preventing diabetes. Feel better about herself.  Patient Reports Self-Efficacy:   She rates her self confidence as 4/5 at this point. Suspect it wavers depending on her depression.  Behavior Change Supports:  [facility], has dog.  Goals:  See patient instructions, suggest small, doable goals as patient is ready, patient reports one large meal a day and uses carbs to 'help her sleep'   Patient Education: answered many questions about meal planning and nutrition, encouraged more protein as her intake does not sound like she is getting adequate amounts. Provided her with information about normal eating which discussed when emotional eating becomes a problem.   Follow up: patient elected to stop visits with Health Coach after today despite encouragement to follow up.

## 2013-12-20 ENCOUNTER — Encounter: Payer: Self-pay | Admitting: Internal Medicine

## 2013-12-28 ENCOUNTER — Ambulatory Visit: Payer: Commercial Managed Care - HMO | Admitting: Internal Medicine

## 2014-01-30 ENCOUNTER — Ambulatory Visit (HOSPITAL_COMMUNITY): Payer: Commercial Managed Care - HMO | Attending: Cardiovascular Disease | Admitting: Radiology

## 2014-01-30 DIAGNOSIS — I10 Essential (primary) hypertension: Secondary | ICD-10-CM | POA: Insufficient documentation

## 2014-01-30 DIAGNOSIS — I272 Pulmonary hypertension, unspecified: Secondary | ICD-10-CM

## 2014-01-30 DIAGNOSIS — I270 Primary pulmonary hypertension: Secondary | ICD-10-CM | POA: Insufficient documentation

## 2014-01-30 DIAGNOSIS — E785 Hyperlipidemia, unspecified: Secondary | ICD-10-CM | POA: Insufficient documentation

## 2014-01-30 NOTE — Progress Notes (Signed)
Echocardiogram performed.  

## 2014-02-01 NOTE — Progress Notes (Signed)
Quick Note:  Pt aware of results. ______ 

## 2014-02-10 ENCOUNTER — Telehealth: Payer: Self-pay | Admitting: *Deleted

## 2014-02-10 NOTE — Addendum Note (Signed)
Addended by: Maura CrandallGOLDSTON, Praneeth Bussey C on: 02/10/2014 01:25 PM   Modules accepted: Orders

## 2014-02-10 NOTE — Telephone Encounter (Addendum)
Call from patient requesting info about mammo and dexa appts. Appointement has been scheduled for Jan 6th 10am for mammo and 10:15am for dexa.  Message left on pt's recorder with appt info. Phone call complete.Criss AlvineGoldston, Darlene Cassady12/11/20152:00 PM

## 2014-02-27 ENCOUNTER — Other Ambulatory Visit: Payer: Self-pay | Admitting: Internal Medicine

## 2014-03-01 ENCOUNTER — Telehealth: Payer: Self-pay | Admitting: *Deleted

## 2014-03-01 NOTE — Telephone Encounter (Signed)
Pt calls and states she takes xarelto and last night had stomach pain all night then this am she had 2 bm's and after each she had pink smears on the tissue. She is ask to go to urg care for eval, she states she would rather not, it is explained that the imc will be closed tomorrow and Friday for the new year holiday and just to be assured that there is not an underlying issue it would be a good idea to get evaluated but if she desires to wait an appt can be scheduled for mon or tues of next week, she says it wll take a long time at the ED but she is ask to go to urg care and most likely the wait wont be as great, she finally agrees to do so, she states she has never had abd pain like she had last pm before.

## 2014-03-01 NOTE — Telephone Encounter (Signed)
Agree, thank you

## 2014-03-08 ENCOUNTER — Ambulatory Visit (HOSPITAL_COMMUNITY)
Admission: RE | Admit: 2014-03-08 | Discharge: 2014-03-08 | Disposition: A | Discharge status: Home / Self Care | Payer: Commercial Managed Care - HMO | Source: Ambulatory Visit | Attending: Internal Medicine | Admitting: Internal Medicine

## 2014-03-08 ENCOUNTER — Other Ambulatory Visit: Payer: Self-pay | Admitting: Internal Medicine

## 2014-03-08 ENCOUNTER — Encounter: Payer: Self-pay | Admitting: Internal Medicine

## 2014-03-08 DIAGNOSIS — M8589 Other specified disorders of bone density and structure, multiple sites: Secondary | ICD-10-CM | POA: Diagnosis not present

## 2014-03-08 DIAGNOSIS — Z1231 Encounter for screening mammogram for malignant neoplasm of breast: Secondary | ICD-10-CM

## 2014-03-08 DIAGNOSIS — Z0000 Encounter for general adult medical examination without abnormal findings: Secondary | ICD-10-CM

## 2014-03-08 DIAGNOSIS — M8580 Other specified disorders of bone density and structure, unspecified site: Secondary | ICD-10-CM | POA: Insufficient documentation

## 2014-03-08 DIAGNOSIS — E669 Obesity, unspecified: Secondary | ICD-10-CM

## 2014-03-08 DIAGNOSIS — M170 Bilateral primary osteoarthritis of knee: Secondary | ICD-10-CM

## 2014-03-13 ENCOUNTER — Other Ambulatory Visit: Payer: Self-pay | Admitting: Internal Medicine

## 2014-03-13 DIAGNOSIS — Z0000 Encounter for general adult medical examination without abnormal findings: Secondary | ICD-10-CM

## 2014-03-23 ENCOUNTER — Encounter: Payer: Self-pay | Admitting: Internal Medicine

## 2014-03-23 ENCOUNTER — Ambulatory Visit (INDEPENDENT_AMBULATORY_CARE_PROVIDER_SITE_OTHER): Payer: Commercial Managed Care - HMO | Admitting: Internal Medicine

## 2014-03-23 VITALS — BP 131/69 | HR 87 | Temp 98.0°F | Wt 354.3 lb

## 2014-03-23 DIAGNOSIS — F329 Major depressive disorder, single episode, unspecified: Secondary | ICD-10-CM

## 2014-03-23 DIAGNOSIS — L608 Other nail disorders: Secondary | ICD-10-CM

## 2014-03-23 DIAGNOSIS — Z86711 Personal history of pulmonary embolism: Secondary | ICD-10-CM

## 2014-03-23 DIAGNOSIS — Z0000 Encounter for general adult medical examination without abnormal findings: Secondary | ICD-10-CM

## 2014-03-23 DIAGNOSIS — Z86718 Personal history of other venous thrombosis and embolism: Secondary | ICD-10-CM

## 2014-03-23 DIAGNOSIS — D6861 Antiphospholipid syndrome: Secondary | ICD-10-CM

## 2014-03-23 DIAGNOSIS — F339 Major depressive disorder, recurrent, unspecified: Secondary | ICD-10-CM

## 2014-03-23 DIAGNOSIS — M8580 Other specified disorders of bone density and structure, unspecified site: Secondary | ICD-10-CM

## 2014-03-23 DIAGNOSIS — K219 Gastro-esophageal reflux disease without esophagitis: Secondary | ICD-10-CM

## 2014-03-23 DIAGNOSIS — I10 Essential (primary) hypertension: Secondary | ICD-10-CM

## 2014-03-23 DIAGNOSIS — G4733 Obstructive sleep apnea (adult) (pediatric): Secondary | ICD-10-CM

## 2014-03-23 DIAGNOSIS — E669 Obesity, unspecified: Secondary | ICD-10-CM

## 2014-03-23 DIAGNOSIS — Z7901 Long term (current) use of anticoagulants: Secondary | ICD-10-CM

## 2014-03-23 NOTE — Assessment & Plan Note (Signed)
She changed her diet and thinks that she no longer need PPI. I asked her to take QOD one week then stop. To use TUMS, also for calcium benefit.

## 2014-03-23 NOTE — Progress Notes (Signed)
   Subjective:    Patient ID: [name], female    DOB: 01/02/1946, 69 y.o.   MRN: [id number]  HPI  Please see the A&P for the status of the pt's chronic medical problems.   Review of Systems  Constitutional: Negative for unexpected weight change.  HENT: Negative for sore throat.   Eyes: Negative for pain.  Respiratory: Negative for shortness of breath.   Cardiovascular: Negative for chest pain.  Skin:       Thick discoloured toenails  Psychiatric/Behavioral: Positive for sleep disturbance and dysphoric mood.       Objective:   Physical Exam  Constitutional: She is oriented to person, place, and time. She appears well-developed and well-nourished. No distress.  HENT:  Head: Normocephalic and atraumatic.  Right Ear: External ear normal.  Left Ear: External ear normal.  Nose: Nose normal.  Eyes: Conjunctivae and EOM are normal. Right eye exhibits no discharge. Left eye exhibits no discharge. No scleral icterus.  Pulmonary/Chest: Effort normal.  Musculoskeletal: She exhibits no edema.  Neurological: She is alert and oriented to person, place, and time.  Skin: Skin is warm and dry. She is not diaphoretic.  Onchomycosis B  Psychiatric: She has a normal mood and affect. Her behavior is normal. Judgment and thought content normal.          Assessment & Plan:

## 2014-03-23 NOTE — Assessment & Plan Note (Signed)
Not using CPAP bc not enough room for it since uses laptop and eats in bed. Offered encouragement.

## 2014-03-23 NOTE — Assessment & Plan Note (Signed)
She is exercising and although not yet losing wt, her BP remains nl OFF BP meds so no need to restart.

## 2014-03-23 NOTE — Assessment & Plan Note (Signed)
She is exercising but not yet losing the weight. Encouraged her to cont.  Wt Readings from Last 3 Encounters:  03/23/14 354 lb 4.8 oz (160.709 kg)  12/15/13 351 lb 4.8 oz (159.349 kg)  11/25/13 345 lb 1.6 oz (156.536 kg)

## 2014-03-23 NOTE — Patient Instructions (Signed)
1. Your blood pressure remains great. You do not need blood pressure pills 2. Keep up the great work walking! That is the best thing you can do. 3. Get about 1200 mg of calcium a day. A serving of dairy has about 300 mg. You may take Tums to get the needed calcium. 4. See the foot doctor. Call me if you need to start the fungus pills 5. Start the stomach pil every other day for one week then stop

## 2014-03-23 NOTE — Assessment & Plan Note (Signed)
Her last appt, when I did obtain blood, was too early to repeat testing. I do not need blood today. She understands that testing will not change what we do but agrees to be retested. Will next blood draw.

## 2014-03-23 NOTE — Assessment & Plan Note (Signed)
I gave her the results of her DEXA. Encouraged calcium 1200 mg QD either diet or exercise. Elected not to take Vit D - will recheck late summer ish.

## 2014-03-23 NOTE — Assessment & Plan Note (Signed)
She is asymptomatic. Her repeat ECHO was nl. She takes her Xarelto without missing a dose.

## 2014-03-23 NOTE — Assessment & Plan Note (Signed)
To see podiatry. I will Rx toenail fungus tx if they will not.

## 2014-03-23 NOTE — Assessment & Plan Note (Signed)
She and her psychiatrist stopped Cymbalta bc too hard to lose weight on the med. On wellbutrin. Has great support grp and her dog. Stays in bed most days and keeps her computer there and eats in bed. But she is exercising = walked about a mile the other day. She understands this is chronic but may be controlled.

## 2014-04-10 ENCOUNTER — Encounter: Payer: Self-pay | Admitting: Podiatry

## 2014-04-10 ENCOUNTER — Ambulatory Visit (INDEPENDENT_AMBULATORY_CARE_PROVIDER_SITE_OTHER): Payer: Commercial Managed Care - HMO | Admitting: Podiatry

## 2014-04-10 VITALS — BP 121/71 | HR 79 | Resp 14 | Ht 67.0 in | Wt 345.0 lb

## 2014-04-10 DIAGNOSIS — B351 Tinea unguium: Secondary | ICD-10-CM | POA: Diagnosis not present

## 2014-04-10 NOTE — Progress Notes (Signed)
   Subjective:    Patient ID: [name], female    DOB: 1946-01-01, 69 y.o.   MRN: [id number]  HPI Comments: N debridement - possible fungal toenails L 10 toenails D and O long-term C elongated thick toenails A pre-diabetic per pt, difficult to cut T Dr. Blanch MediaElizabeth [name] referred  She denies any history of foot ulceration or claudication  Review of Systems  All other systems reviewed and are negative.      Objective:   Physical Exam  Orientated 3  Vascular: DP pulses 2/4 bilaterally PT pulses 1/4 bilaterally  Neurological: Ankle reflex equal and reactive bilaterally Vibratory sensation nonreactive right reactive left Sensation to 10 g monofilament wire intact 5/5 bilaterally  Dermatological: The hallux toenails are hypertrophic and deformed and the remaining nails have occasional texture and color changes Surgical scar fourth left toe  Musculoskeletal: Pes planus bilaterally HAV deformities bilaterally Adductovarus rotation of lateral toes 3-4 bilaterally       Assessment & Plan:   Assessment: Satisfactory neurovascular status Onychomycoses 6-10 bilaterally  Plan: Debrided toenails 10 without a bleeding  Reappoint yearly or as needed

## 2014-04-11 NOTE — Patient Instructions (Signed)
Diabetes and Foot Care Diabetes may cause you to have problems because of poor blood supply (circulation) to your feet and legs. This may cause the skin on your feet to become thinner, break easier, and heal more slowly. Your skin may become dry, and the skin may peel and crack. You may also have nerve damage in your legs and feet causing decreased feeling in them. You may not notice minor injuries to your feet that could lead to infections or more serious problems. Taking care of your feet is one of the most important things you can do for yourself.  HOME CARE INSTRUCTIONS  Wear shoes at all times, even in the house. Do not go barefoot. Bare feet are easily injured.  Check your feet daily for blisters, cuts, and redness. If you cannot see the bottom of your feet, use a mirror or ask someone for help.  Wash your feet with warm water (do not use hot water) and mild soap. Then pat your feet and the areas between your toes until they are completely dry. Do not soak your feet as this can dry your skin.  Apply a moisturizing lotion or petroleum jelly (that does not contain alcohol and is unscented) to the skin on your feet and to dry, brittle toenails. Do not apply lotion between your toes.  Trim your toenails straight across. Do not dig under them or around the cuticle. File the edges of your nails with an emery board or nail file.  Do not cut corns or calluses or try to remove them with medicine.  Wear clean socks or stockings every day. Make sure they are not too tight. Do not wear knee-high stockings since they may decrease blood flow to your legs.  Wear shoes that fit properly and have enough cushioning. To break in new shoes, wear them for just a few hours a day. This prevents you from injuring your feet. Always look in your shoes before you put them on to be sure there are no objects inside.  Do not cross your legs. This may decrease the blood flow to your feet.  If you find a minor scrape,  cut, or break in the skin on your feet, keep it and the skin around it clean and dry. These areas may be cleansed with mild soap and water. Do not cleanse the area with peroxide, alcohol, or iodine.  When you remove an adhesive bandage, be sure not to damage the skin around it.  If you have a wound, look at it several times a day to make sure it is healing.  Do not use heating pads or hot water bottles. They may burn your skin. If you have lost feeling in your feet or legs, you may not know it is happening until it is too late.  Make sure your health care provider performs a complete foot exam at least annually or more often if you have foot problems. Report any cuts, sores, or bruises to your health care provider immediately. SEEK MEDICAL CARE IF:   You have an injury that is not healing.  You have cuts or breaks in the skin.  You have an ingrown nail.  You notice redness on your legs or feet.  You feel burning or tingling in your legs or feet.  You have pain or cramps in your legs and feet.  Your legs or feet are numb.  Your feet always feel cold. SEEK IMMEDIATE MEDICAL CARE IF:   There is increasing redness,   swelling, or pain in or around a wound.  There is a red line that goes up your leg.  Pus is coming from a wound.  You develop a fever or as directed by your health care provider.  You notice a bad smell coming from an ulcer or wound. Document Released: 02/15/2000 Document Revised: 10/20/2012 Document Reviewed: 07/27/2012 ExitCare Patient Information 2015 [employer]. This information is not intended to replace advice given to you by your health care provider. Make sure you discuss any questions you have with your health care provider.  

## 2014-07-18 ENCOUNTER — Other Ambulatory Visit: Payer: Self-pay | Admitting: Internal Medicine

## 2014-07-27 ENCOUNTER — Encounter: Payer: Self-pay | Admitting: Internal Medicine

## 2014-07-27 ENCOUNTER — Ambulatory Visit (INDEPENDENT_AMBULATORY_CARE_PROVIDER_SITE_OTHER): Payer: Commercial Managed Care - HMO | Admitting: Internal Medicine

## 2014-07-27 VITALS — BP 143/93 | HR 84 | Temp 98.4°F | Wt 345.2 lb

## 2014-07-27 DIAGNOSIS — Z23 Encounter for immunization: Secondary | ICD-10-CM

## 2014-07-27 DIAGNOSIS — Z86711 Personal history of pulmonary embolism: Secondary | ICD-10-CM | POA: Diagnosis not present

## 2014-07-27 DIAGNOSIS — D6861 Antiphospholipid syndrome: Secondary | ICD-10-CM

## 2014-07-27 DIAGNOSIS — E669 Obesity, unspecified: Secondary | ICD-10-CM | POA: Diagnosis not present

## 2014-07-27 DIAGNOSIS — M8580 Other specified disorders of bone density and structure, unspecified site: Secondary | ICD-10-CM

## 2014-07-27 DIAGNOSIS — I272 Other secondary pulmonary hypertension: Secondary | ICD-10-CM | POA: Diagnosis not present

## 2014-07-27 DIAGNOSIS — Z7901 Long term (current) use of anticoagulants: Secondary | ICD-10-CM

## 2014-07-27 DIAGNOSIS — N182 Chronic kidney disease, stage 2 (mild): Secondary | ICD-10-CM

## 2014-07-27 DIAGNOSIS — E559 Vitamin D deficiency, unspecified: Secondary | ICD-10-CM

## 2014-07-27 DIAGNOSIS — I129 Hypertensive chronic kidney disease with stage 1 through stage 4 chronic kidney disease, or unspecified chronic kidney disease: Secondary | ICD-10-CM

## 2014-07-27 DIAGNOSIS — I10 Essential (primary) hypertension: Secondary | ICD-10-CM

## 2014-07-27 DIAGNOSIS — E785 Hyperlipidemia, unspecified: Secondary | ICD-10-CM | POA: Diagnosis not present

## 2014-07-27 DIAGNOSIS — K219 Gastro-esophageal reflux disease without esophagitis: Secondary | ICD-10-CM

## 2014-07-27 DIAGNOSIS — G4733 Obstructive sleep apnea (adult) (pediatric): Secondary | ICD-10-CM

## 2014-07-27 DIAGNOSIS — Z86718 Personal history of other venous thrombosis and embolism: Secondary | ICD-10-CM

## 2014-07-27 DIAGNOSIS — D6859 Other primary thrombophilia: Secondary | ICD-10-CM | POA: Diagnosis not present

## 2014-07-27 DIAGNOSIS — F329 Major depressive disorder, single episode, unspecified: Secondary | ICD-10-CM

## 2014-07-27 LAB — POCT GLYCOSYLATED HEMOGLOBIN (HGB A1C): HEMOGLOBIN A1C: 6.2

## 2014-07-27 LAB — BASIC METABOLIC PANEL WITH GFR
BUN: 20 mg/dL (ref 6–23)
CO2: 26 meq/L (ref 19–32)
Calcium: 9.3 mg/dL (ref 8.4–10.5)
Chloride: 103 mEq/L (ref 96–112)
Creat: 1.1 mg/dL (ref 0.50–1.10)
GFR, EST AFRICAN AMERICAN: 60 mL/min
GFR, Est Non African American: 52 mL/min — ABNORMAL LOW
GLUCOSE: 94 mg/dL (ref 70–99)
Potassium: 3.6 mEq/L (ref 3.5–5.3)
SODIUM: 140 meq/L (ref 135–145)

## 2014-07-27 LAB — LIPID PANEL
Cholesterol: 152 mg/dL (ref 0–200)
HDL: 47 mg/dL (ref >=46)
LDL CALC: 92 mg/dL (ref 0–99)
Total CHOL/HDL Ratio: 3.2 Ratio
Triglycerides: 65 mg/dL (ref <150)
VLDL: 13 mg/dL (ref 0–40)

## 2014-07-27 LAB — GLUCOSE, CAPILLARY: GLUCOSE-CAPILLARY: 98 mg/dL (ref 65–99)

## 2014-07-27 MED ORDER — POTASSIUM CHLORIDE ER 10 MEQ PO TBCR: 10.0000 meq | EXTENDED_RELEASE_TABLET | Freq: Two times a day (BID) | ORAL | Status: DC

## 2014-07-27 MED ORDER — HYDROCHLOROTHIAZIDE 25 MG PO TABS: 25.0000 mg | ORAL_TABLET | Freq: Every day | ORAL | Status: DC

## 2014-07-27 MED ORDER — AMLODIPINE BESYLATE 5 MG PO TABS: 5.0000 mg | ORAL_TABLET | Freq: Every day | ORAL | Status: DC

## 2014-07-27 NOTE — Patient Instructions (Signed)
1. Please see me in 1-2 months to recheck your pressure 2. Do not be too hard on yourself!

## 2014-07-28 DIAGNOSIS — N182 Chronic kidney disease, stage 2 (mild): Secondary | ICD-10-CM | POA: Insufficient documentation

## 2014-07-28 DIAGNOSIS — N183 Chronic kidney disease, stage 3 unspecified: Secondary | ICD-10-CM | POA: Insufficient documentation

## 2014-07-28 LAB — CARDIOLIPIN ANTIBODIES, IGM+IGG
Anticardiolipin IgG: 10 GPL U/mL (ref <23)
Anticardiolipin IgM: 34 MPL U/mL — ABNORMAL HIGH (ref <11)

## 2014-07-28 LAB — VITAMIN D 25 HYDROXY (VIT D DEFICIENCY, FRACTURES): VIT D 25 HYDROXY: 27 ng/mL — AB (ref 30–100)

## 2014-07-28 NOTE — Progress Notes (Signed)
   Subjective:    Patient ID: [name], female    DOB: 23-Oct-1945, 69 y.o.   MRN: [id number]  HPI  [name] is here for depression F/U. Please see the A&P for the status of the pt's chronic medical problems.  Review of Systems  Constitutional: Negative for unexpected weight change.  HENT: Positive for postnasal drip. Negative for congestion, rhinorrhea and sinus pressure.   Eyes: Positive for redness. Negative for pain, discharge and itching.  Cardiovascular: Negative for chest pain.  Gastrointestinal: Negative for abdominal pain.  Psychiatric/Behavioral: Positive for dysphoric mood.       Objective:   Physical Exam  Constitutional: She is oriented to person, place, and time. She appears well-developed and well-nourished. No distress.  HENT:  Head: Normocephalic and atraumatic.  Right Ear: External ear normal.  Left Ear: External ear normal.  Nose: Nose normal.  Eyes: EOM are normal. Right eye exhibits no discharge. Left eye exhibits no discharge. No scleral icterus.  Injected sclera B. No discharge  Cardiovascular: Normal rate, regular rhythm and normal heart sounds.   Pulmonary/Chest: Effort normal and breath sounds normal.  Musculoskeletal: Normal range of motion. She exhibits no edema.  Neurological: She is alert and oriented to person, place, and time.  Skin: Skin is warm and dry. She is not diaphoretic.  Psychiatric: She has a normal mood and affect. Her behavior is normal. Judgment and thought content normal.  Good eye contact. Good insight into her disease and ways to improve          Assessment & Plan:

## 2014-07-28 NOTE — Assessment & Plan Note (Signed)
Remains on DOAC and understands importance of not missing a dose.

## 2014-07-28 NOTE — Assessment & Plan Note (Addendum)
Wt Readings from Last 3 Encounters:  07/27/14 345 lb 3.2 oz (156.582 kg)  04/10/14 345 lb (156.491 kg)  03/23/14 354 lb 4.8 oz (160.709 kg)   I offered [name] referral and she declined. Eating a lot of processed foods and states know what to eat.  A1C 6.2

## 2014-07-28 NOTE — Assessment & Plan Note (Addendum)
Reccommended Cal and Vit D OTC. Vit D level today is 27.

## 2014-07-28 NOTE — Assessment & Plan Note (Signed)
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 She is on DOAC. We repeated her anticardiolipin Ab and remains + with high titer. 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 Regardless, needs lifelong anticoag unless risks bc > than benefits.

## 2014-07-28 NOTE — Assessment & Plan Note (Signed)
Asymptomatic. Secondary from the acute PE but also has OSA which she does not use CPAP and obesity. She is working on her weight but not yet overly successful. Cont encouragement.

## 2014-07-28 NOTE — Assessment & Plan Note (Signed)
Cr overall stable but she is at risk of decline. Will cont to check periodically.

## 2014-07-28 NOTE — Assessment & Plan Note (Signed)
This and her weight are her biggest issues and are intertwined. She remains on her wellbutrin and xanax (Rx'd mental health) and goes to a support grp. Her concern is that she lives alone and has no family so will die alone. Does had people she is friendly with from the support grp but no real friends. We talked about volunteer work and she is thinking about volunteering here are hospital. Still has her dog but he is 14.

## 2014-07-28 NOTE — Assessment & Plan Note (Signed)
She is on prava 40 and her LDL is 92 - at goal. 10 yr CV risk is < 10%. Cont med. No SE.

## 2014-07-28 NOTE — Assessment & Plan Note (Signed)
Not using her CPAP 

## 2014-07-28 NOTE — Assessment & Plan Note (Signed)
She remains off her PPI but her diet got worse and is using baking soda PRN. Bicarb nl at 26. To call if worse - I would start with H2B.

## 2014-07-28 NOTE — Assessment & Plan Note (Addendum)
BP Readings from Last 3 Encounters:  07/27/14 143/93  04/10/14 121/71  03/23/14 131/69   She self resumed her norvasc 5 and HCTZ 25 bc felt her BP was high. She was right. I refilled meds at same dose and she will return in 1-2 months for recheck. May titrate up norvasc to 10 if still high. BMP today OK. Cont KCl 10 mEq 2 QD.

## 2014-08-07 ENCOUNTER — Encounter: Payer: Self-pay | Admitting: Internal Medicine

## 2014-09-25 ENCOUNTER — Other Ambulatory Visit: Payer: Self-pay | Admitting: Internal Medicine

## 2014-09-25 MED ORDER — RIVAROXABAN 20 MG PO TABS: 20.0000 mg | ORAL_TABLET | Freq: Every day | ORAL | Status: DC

## 2014-09-25 NOTE — Telephone Encounter (Signed)
[facility]

## 2014-09-25 NOTE — Telephone Encounter (Signed)
Pt has been out for 3 days

## 2014-12-08 ENCOUNTER — Other Ambulatory Visit: Payer: Self-pay | Admitting: Internal Medicine

## 2014-12-11 NOTE — Telephone Encounter (Signed)
Pls sch appt with me before end of yr fo 6 month BP F/U. Thanks

## 2015-01-10 ENCOUNTER — Encounter: Payer: Self-pay | Admitting: Internal Medicine

## 2015-01-11 ENCOUNTER — Ambulatory Visit: Payer: Commercial Managed Care - HMO | Admitting: Internal Medicine

## 2015-03-12 ENCOUNTER — Encounter (HOSPITAL_COMMUNITY): Payer: Self-pay | Admitting: Emergency Medicine

## 2015-03-12 ENCOUNTER — Emergency Department (HOSPITAL_COMMUNITY)
Admission: EM | Admit: 2015-03-12 | Discharge: 2015-03-12 | Disposition: A | Discharge status: Home / Self Care | Payer: Commercial Managed Care - HMO | Attending: Emergency Medicine | Admitting: Emergency Medicine

## 2015-03-12 DIAGNOSIS — K0889 Other specified disorders of teeth and supporting structures: Secondary | ICD-10-CM | POA: Diagnosis not present

## 2015-03-12 DIAGNOSIS — Z8619 Personal history of other infectious and parasitic diseases: Secondary | ICD-10-CM | POA: Diagnosis not present

## 2015-03-12 DIAGNOSIS — K002 Abnormalities of size and form of teeth: Secondary | ICD-10-CM | POA: Insufficient documentation

## 2015-03-12 DIAGNOSIS — Z87891 Personal history of nicotine dependence: Secondary | ICD-10-CM | POA: Insufficient documentation

## 2015-03-12 DIAGNOSIS — E785 Hyperlipidemia, unspecified: Secondary | ICD-10-CM | POA: Diagnosis not present

## 2015-03-12 DIAGNOSIS — Z79899 Other long term (current) drug therapy: Secondary | ICD-10-CM | POA: Insufficient documentation

## 2015-03-12 DIAGNOSIS — Z7901 Long term (current) use of anticoagulants: Secondary | ICD-10-CM | POA: Insufficient documentation

## 2015-03-12 DIAGNOSIS — Z86711 Personal history of pulmonary embolism: Secondary | ICD-10-CM | POA: Diagnosis not present

## 2015-03-12 DIAGNOSIS — Z8739 Personal history of other diseases of the musculoskeletal system and connective tissue: Secondary | ICD-10-CM | POA: Diagnosis not present

## 2015-03-12 DIAGNOSIS — N189 Chronic kidney disease, unspecified: Secondary | ICD-10-CM | POA: Diagnosis not present

## 2015-03-12 DIAGNOSIS — I129 Hypertensive chronic kidney disease with stage 1 through stage 4 chronic kidney disease, or unspecified chronic kidney disease: Secondary | ICD-10-CM | POA: Insufficient documentation

## 2015-03-12 DIAGNOSIS — Z8742 Personal history of other diseases of the female genital tract: Secondary | ICD-10-CM | POA: Insufficient documentation

## 2015-03-12 DIAGNOSIS — E669 Obesity, unspecified: Secondary | ICD-10-CM | POA: Diagnosis not present

## 2015-03-12 DIAGNOSIS — F329 Major depressive disorder, single episode, unspecified: Secondary | ICD-10-CM | POA: Insufficient documentation

## 2015-03-12 DIAGNOSIS — G478 Other sleep disorders: Secondary | ICD-10-CM | POA: Diagnosis not present

## 2015-03-12 MED ORDER — PENICILLIN V POTASSIUM 500 MG PO TABS: 500.0000 mg | ORAL_TABLET | Freq: Four times a day (QID) | ORAL | Status: DC

## 2015-03-12 NOTE — Discharge Instructions (Signed)

## 2015-03-12 NOTE — ED Provider Notes (Signed)
CSN: [id number]     Arrival date & time 03/12/15  1252 History  By signing my name below, I, [name], attest that this documentation has been prepared under the direction and in the presence of Patricia Horsemanobert Blythe Hartshorn, PA-C Electronically Signed: Soijett [name], ED Scribe. 03/12/2015. 1:39 PM.   Chief Complaint  Patient presents with  . Dental Pain      The history is provided by the patient. No language interpreter was used.    [name] is a 70 y.o. female with a medical hx of HTN who presents to the Emergency Department complaining of 6/10 dental pain onset 2 days. She reports that she has a red raised area to the roof of her mouth x 2 days to which she denies ever having this occur to her in the past. She doesn't currently have a dentist but she [facility]. She states that she has a dental post to the front of her teeth and she is unsure of how long she has had it. She states that she has tried amisol and OTC canker medications with no relief for her symptoms. She denies gum swelling/bleeding, sore throat, fever, chills, and any other symptoms. Pt denies allergies to any antibiotics.    Past Medical History  Diagnosis Date  . Depression     followed by Dr. Evelene CroonKaur  . Diverticulosis of colon   . History of hepatitis B   . Hypertension   . Arthritis     osteoarthritis , kness.  [name]. History of pulmonary embolism 01/2002  . Obesity   . Fibrocystic breast disease   . Obstructive sleep apnea     refuses CPAP.  [name]. Hyperlipidemia   . Chronic kidney disease     Renal insuffucinecy, cr-1.33 in 10/2005.  [name]. Left shoulder pain     2/2 fall  . Neck nodule     not erythematous, movable like cyst located at the base of posterior neck on the left side(not painful), will need follow up for   . Pulmonary nodule     Stable on repeat imaging. LLL nodule 5 mm   Past Surgical History  Procedure Laterality Date  . Gyn surgery      s/p excison of  vulvar cyst 10/18/04 by dr. Ilene Qualisa JacksonChristell Constant- Moore   Family History  Problem Relation Age of Onset  . Hypertension      family history  . Kidney disease Maternal Uncle    Social History  Substance Use Topics  . Smoking status: Former Smoker -- 1.00 packs/day for 20 years    Types: Cigarettes    Quit date: 03/03/1990  . Smokeless tobacco: None  . Alcohol Use: No   OB History    No data available     Review of Systems  Constitutional: Negative for fever and chills.  HENT: Positive for dental problem. Negative for drooling.   Neurological: Negative for speech difficulty.  Psychiatric/Behavioral: Positive for sleep disturbance.      Allergies  Lisinopril  Home Medications   Prior to Admission medications   Medication Sig Start Date End Date Taking? Authorizing Provider  ALPRAZolam [name](XANAX) 1 MG tablet Take 1 mg by mouth at bedtime as needed for sleep. Take 1.5 tab by mouth everynight.    Historical Provider, MD  amLODipine (NORVASC) 5 MG tablet Take 1 tablet (5 mg total) by mouth daily. 07/27/14   Burns SpainElizabeth A Butcher, MD  buPROPion (WELLBUTRIN) 100 MG  tablet Take 100 mg by mouth 3 (three) times daily.    Historical Provider, MD  hydrochlorothiazide (HYDRODIURIL) 25 MG tablet Take 1 tablet (25 mg total) by mouth daily. 07/27/14   [name], MD  Multiple Vitamin (MULTIVITAMIN) tablet Take 1 tablet by mouth daily. 07/11/13   [name], MD  penicillin v potassium (VEETID) 500 MG tablet Take 1 tablet (500 mg total) by mouth 4 (four) times daily. 03/12/15   Patricia Horseman, PA-C  potassium chloride (K-DUR) 10 MEQ tablet Take 1 tablet (10 mEq total) by mouth 2 (two) times daily. 07/27/14   [name], MD  pravastatin (PRAVACHOL) 40 MG tablet TAKE 1 TABLET EVERY DAY 07/19/14   [name], MD  XARELTO 20 MG TABS tablet TAKE 1 TABLET EVERY DAY WITH SUPPER 12/11/14   [name], MD   BP 145/95 mmHg  Pulse 93  Temp(Src) 98.2 F (36.8 C) (Oral)  Resp  20  SpO2 97% Physical Exam Physical Exam  Constitutional: Pt appears well-developed and well-nourished.  HENT:  Head: Normocephalic.  Right Ear: Tympanic membrane, external ear and ear canal normal.  Left Ear: Tympanic membrane, external ear and ear canal normal.  Nose: Nose normal. Right sinus exhibits no maxillary sinus tenderness and no frontal sinus tenderness. Left sinus exhibits no maxillary sinus tenderness and no frontal sinus tenderness.  Mouth/Throat: Uvula is midline, oropharynx is clear and moist and mucous membranes are normal. No oral lesions. Abnormal dentition, largely edentulous, but does have an upper bridge, left upper K9 is rotten and eroded. No uvula swelling or lacerations. No oropharyngeal exudate, posterior oropharyngeal edema, posterior oropharyngeal erythema or tonsillar abscesses.  No gingival swelling, fluctuance or induration No gross abscess  Eyes: Conjunctivae are normal. Pupils are equal, round, and reactive to light. Right eye exhibits no discharge. Left eye exhibits no discharge.  Neck: Normal range of motion. Neck supple.  No stridor Handling secretions without difficulty No nuchal rigidity No cervical lymphadenopathy  Cardiovascular: Normal rate, regular rhythm and normal heart sounds.   Pulmonary/Chest: Effort normal. No respiratory distress.  Equal chest rise  Abdominal: Soft. Bowel sounds are normal. Pt exhibits no distension. There is no tenderness.  Lymphadenopathy:    Pt has no cervical adenopathy.  Neurological: Pt is alert.  Skin: Skin is warm and dry.  Psychiatric: Pt has a normal mood and affect.  Nursing note and vitals reviewed.   ED Course  Procedures (including critical care time) DIAGNOSTIC STUDIES: Oxygen Saturation is 97% on RA, nl by my interpretation.    COORDINATION OF CARE: 1:35 PM Discussed treatment plan with pt at bedside which includes abx Rx, referral and f/u with dentist and pt agreed to plan.     MDM   Final  diagnoses:  Pain, dental    Patient with toothache.  No gross abscess.  Exam unconcerning for Ludwig's angina or spread of infection.  Will treat with penicillin and OTC pain medicine.  Urged patient to follow-up with dentist.     I personally performed the services described in this documentation, which was scribed in my presence. The recorded information has been reviewed and is accurate.      Patricia Horseman, PA-C 03/12/15 1343  Doug Sou, MD 03/12/15 [phone]

## 2015-03-12 NOTE — ED Notes (Signed)
Patient presents for dental pain and "knot in roof of mouth". Poor dental health with several dental caries and missing teeth. Patient c/o "whole mouth" hurting. Rates pain 6/10.

## 2015-04-05 ENCOUNTER — Other Ambulatory Visit: Payer: Self-pay

## 2015-04-05 ENCOUNTER — Other Ambulatory Visit: Payer: Self-pay | Admitting: Internal Medicine

## 2015-04-05 ENCOUNTER — Ambulatory Visit (INDEPENDENT_AMBULATORY_CARE_PROVIDER_SITE_OTHER): Payer: Commercial Managed Care - HMO | Admitting: Internal Medicine

## 2015-04-05 ENCOUNTER — Encounter: Payer: Self-pay | Admitting: Internal Medicine

## 2015-04-05 VITALS — BP 149/95 | HR 78 | Temp 98.0°F | Wt 367.6 lb

## 2015-04-05 DIAGNOSIS — Z1231 Encounter for screening mammogram for malignant neoplasm of breast: Secondary | ICD-10-CM

## 2015-04-05 DIAGNOSIS — Z86711 Personal history of pulmonary embolism: Secondary | ICD-10-CM

## 2015-04-05 DIAGNOSIS — Z8639 Personal history of other endocrine, nutritional and metabolic disease: Secondary | ICD-10-CM

## 2015-04-05 DIAGNOSIS — K219 Gastro-esophageal reflux disease without esophagitis: Secondary | ICD-10-CM

## 2015-04-05 DIAGNOSIS — R350 Frequency of micturition: Secondary | ICD-10-CM

## 2015-04-05 DIAGNOSIS — Z7901 Long term (current) use of anticoagulants: Secondary | ICD-10-CM | POA: Diagnosis not present

## 2015-04-05 DIAGNOSIS — Z23 Encounter for immunization: Secondary | ICD-10-CM

## 2015-04-05 DIAGNOSIS — M79671 Pain in right foot: Secondary | ICD-10-CM

## 2015-04-05 DIAGNOSIS — G4733 Obstructive sleep apnea (adult) (pediatric): Secondary | ICD-10-CM

## 2015-04-05 DIAGNOSIS — Z6841 Body Mass Index (BMI) 40.0 and over, adult: Secondary | ICD-10-CM

## 2015-04-05 DIAGNOSIS — I10 Essential (primary) hypertension: Secondary | ICD-10-CM

## 2015-04-05 DIAGNOSIS — Z86718 Personal history of other venous thrombosis and embolism: Secondary | ICD-10-CM

## 2015-04-05 DIAGNOSIS — N3946 Mixed incontinence: Secondary | ICD-10-CM | POA: Insufficient documentation

## 2015-04-05 DIAGNOSIS — N3941 Urge incontinence: Secondary | ICD-10-CM | POA: Diagnosis not present

## 2015-04-05 DIAGNOSIS — M8580 Other specified disorders of bone density and structure, unspecified site: Secondary | ICD-10-CM

## 2015-04-05 DIAGNOSIS — E785 Hyperlipidemia, unspecified: Secondary | ICD-10-CM

## 2015-04-05 DIAGNOSIS — Z0000 Encounter for general adult medical examination without abnormal findings: Secondary | ICD-10-CM

## 2015-04-05 DIAGNOSIS — F329 Major depressive disorder, single episode, unspecified: Secondary | ICD-10-CM

## 2015-04-05 DIAGNOSIS — F339 Major depressive disorder, recurrent, unspecified: Secondary | ICD-10-CM

## 2015-04-05 DIAGNOSIS — R269 Unspecified abnormalities of gait and mobility: Secondary | ICD-10-CM

## 2015-04-05 LAB — POCT GLYCOSYLATED HEMOGLOBIN (HGB A1C): Hemoglobin A1C: 5.7

## 2015-04-05 LAB — GLUCOSE, CAPILLARY: Glucose-Capillary: 120 mg/dL — ABNORMAL HIGH (ref 65–99)

## 2015-04-05 MED ORDER — MIRABEGRON ER 25 MG PO TB24: 25.0000 mg | ORAL_TABLET | Freq: Every day | ORAL | Status: DC

## 2015-04-05 NOTE — Assessment & Plan Note (Signed)
She remains on her Xarelto and states no missed doses.  PLAN:  Cont current meds

## 2015-04-05 NOTE — Assessment & Plan Note (Addendum)
She is only taking her HCTZ 25 and norvasc 5 sporadically, mostly on weekends, bc she thought sh had to take them together and the HCTZ causes her urinary issues to worsen. BP remains high.  BP Readings from Last 3 Encounters:  04/05/15 149/95  03/12/15 145/95  07/27/14 143/93     PLAN : Norvasc 5 QD HCTZ 25 PRN for lege edema RTC May

## 2015-04-05 NOTE — Assessment & Plan Note (Signed)
She remains on her prava 48 and last LDL was 92 in May 2016  PLAN:  Cont current meds FLP in May

## 2015-04-05 NOTE — Assessment & Plan Note (Addendum)
She aksed about PSP. Never had abnl. I showed her guidelines that state PAP not needed after age 68 if low risk. Pt OK with plan but might go if [facility] has free PAP clinic.  Asked about MMG - most recent Jan 2016 - I ordered MMG for screening.  Talked about HCV Ab testing. Agreed but will wait for May venepuncture.  Agreed to stool cards and understands that if + would lead to colonoscopy  C/O R heel pain over inf achilles tendon on sides B. Only when walks or moves foot. No injury. I suspect achilles tendonopathy. To use ice 15 min QID, ace bandage, and IBU 400 BID with food for 7 days and then call me if not better.  Poor dentition - provided her with resources to find lower cost for teeth extraction as told she has several teeth that need to be pulled and infxn.

## 2015-04-05 NOTE — Assessment & Plan Note (Signed)
See obesity.   PLAN : cont current meds

## 2015-04-05 NOTE — Assessment & Plan Note (Signed)
She doesn't wear CPAP bc she knocks it off. She saw dentistry and they gave her handout on oral appliance. I am not aware of literature on this device but gave her results of her sleep study to take to dentist if she decides to persue it.  PLAN F/U dentistry.

## 2015-04-05 NOTE — Assessment & Plan Note (Signed)
Today states only gets GERD if she eats pizza. Denies cont to take baking soda.   PLAN : cont current mgmt

## 2015-04-05 NOTE — Assessment & Plan Note (Signed)
She decided not to take Vit C and calcium.   PLAN Repeat DEXA about 2018 or so

## 2015-04-05 NOTE — Assessment & Plan Note (Addendum)
Wt Readings from Last 3 Encounters:  04/05/15 367 lb 9.6 oz (166.742 kg)  07/27/14 345 lb 3.2 oz (156.582 kg)  04/10/14 345 lb (156.491 kg)   She cont to struggle with her weight. Her depression plays a role into this greatly. She has no outside interest. She states she is only alive bc of her dog and letting her dog out onto her patio is the only reason she leaves her bed. Therefore, I have no issue witting a letter that her dog is medically necessary. She cont grp therapy. Cont her wellbutrin and xanax.  She asked for a letter that she needs a bathtub instead ofe stand up shower. She states she cannot get clean with a shower and shower chair bc she needs to soak in a tub and aggitate the water. I am concerend about fall risk getting in and out of a tub. She agreed to home health OT referral to assess equipments need.  PLAN:  Cont current meds

## 2015-04-05 NOTE — Progress Notes (Signed)
   Subjective:    Patient ID: [name], female    DOB: [birth year], 70 y.o.   MRN: [id number]  HPI  [name] is here for despression F/U. Please see the A&P for the status of the pt's chronic medical problems.  ROS : per ROS section and in problem oriented charting. All other systems are negative. PMHx, Soc hx, and / or Fam hx : She is trying to move section 8 apartments. Wants letter that her dog is medically necessary.   Review of Systems  Constitutional: Positive for fatigue and unexpected weight change.  Eyes: Positive for visual disturbance.       Tearing in eyes B  Gastrointestinal:       GERD with pizza intake Formed stools  Genitourinary: Positive for urgency, frequency and difficulty urinating. Negative for dysuria.       Urinary incontinence with urge  Musculoskeletal: Positive for arthralgias and gait problem.  Psychiatric/Behavioral: Positive for sleep disturbance and dysphoric mood.       Objective:   Physical Exam  Constitutional: She appears well-developed and well-nourished. No distress.  HENT:  Head: Normocephalic and atraumatic.  Right Ear: External ear normal.  Left Ear: External ear normal.  Nose: Nose normal.  Eyes tearing B  Eyes: Conjunctivae and EOM are normal. Right eye exhibits discharge. Left eye exhibits discharge.  Neck: Neck supple. No thyromegaly present.  Cardiovascular: Normal rate, regular rhythm and normal heart sounds.   Lymphadenopathy:    She has no cervical adenopathy.  Neurological: She is alert.  Slight tremor to lip   Skin: She is not diaphoretic.  Psychiatric: She has a normal mood and affect. Her behavior is normal. Judgment and thought content normal.          Assessment & Plan:

## 2015-04-05 NOTE — Patient Instructions (Signed)
1. For your ankle  Wrap with ace bandage  Take ibuprofen 400 mg twice a day with food - for 7 days only  Use ice to ankle for 15 min four times a day  If no better in one week, call me  2. I will send you letter for dog via MyChart and mail  3. You got flu shot today  4. I will check labs in May  5. Pls complete the stool cards  6. Get your mammogram  7. I gave you sleep study results - work with dentistry if you want to see if mouth device works  8. I referred you to eye doctor  9. Call [facility] to see if they will see you for your teeth  10. I will get home health to evaluate baht tub needs  11. Try the new urine pill - call me if no better in 4 weeks  12. Take your Norvasc (amlodipine) every day for your blood pressure. Take the water pill only as needed

## 2015-04-05 NOTE — Assessment & Plan Note (Signed)
C/O urine freq and incontinence. She she feels the urge to urinate she has to right then. Having to wear depends. Had been on ditropan until 07/2013 when it was stopped bc it was ineffective and she had tolerance issuese. She brought up hygiene issues and I am concerned about skin breakdown if constantly wet. Since couldn't tolerate ditropan, will start mirabegron at 25 mg. She is to call if no better at 4 weeks. F/U May 11th.  PLAN : As above

## 2015-04-06 ENCOUNTER — Telehealth: Payer: Self-pay | Admitting: Licensed Clinical Social Worker

## 2015-04-06 NOTE — Telephone Encounter (Signed)
[name] was referred to CSW as pt is requesting a Reasonable Accomodation for an apartment with a tub, pt currently has stand-up shower.  However, PCP concerned regarding increased potential of fall risk.  PCP placed referral for [facility] services to assess for DME needs.  CSW placed call to [name] to obtain agency of choice for [facility] services.  Pt states she used [facility] services following a d/c from [facility] and requested CSW to review chart for provider.  Pt was referred to [facility] in 2015.  Pt requesting referral to [facility].  CSW informed pt referral will be sent to [facility], should [facility] be unable to staff will f/u with pt for additional preferences.  Referral faxed to [facility].

## 2015-04-08 ENCOUNTER — Encounter: Payer: Self-pay | Admitting: Internal Medicine

## 2015-04-09 DIAGNOSIS — M25562 Pain in left knee: Secondary | ICD-10-CM | POA: Diagnosis not present

## 2015-04-09 DIAGNOSIS — Z86718 Personal history of other venous thrombosis and embolism: Secondary | ICD-10-CM | POA: Diagnosis not present

## 2015-04-09 DIAGNOSIS — K219 Gastro-esophageal reflux disease without esophagitis: Secondary | ICD-10-CM | POA: Diagnosis not present

## 2015-04-09 DIAGNOSIS — I10 Essential (primary) hypertension: Secondary | ICD-10-CM | POA: Diagnosis not present

## 2015-04-09 DIAGNOSIS — M25561 Pain in right knee: Secondary | ICD-10-CM | POA: Diagnosis not present

## 2015-04-09 DIAGNOSIS — F329 Major depressive disorder, single episode, unspecified: Secondary | ICD-10-CM | POA: Diagnosis not present

## 2015-04-09 DIAGNOSIS — E785 Hyperlipidemia, unspecified: Secondary | ICD-10-CM | POA: Diagnosis not present

## 2015-04-10 ENCOUNTER — Telehealth: Payer: Self-pay | Admitting: Internal Medicine

## 2015-04-10 NOTE — Telephone Encounter (Signed)
[name] has called back, she needs OT for ADL's she has been having some trouble, verbal approval given, do you agree?

## 2015-04-10 NOTE — Telephone Encounter (Signed)
agree

## 2015-04-10 NOTE — Telephone Encounter (Signed)
Calling to get a verbal order for occupational therapy to increase ADLs

## 2015-04-10 NOTE — Telephone Encounter (Signed)
Verbal order to darnell OT [facility] , lm for rtc

## 2015-04-11 ENCOUNTER — Telehealth: Payer: Self-pay | Admitting: Licensed Clinical Social Worker

## 2015-04-11 NOTE — Telephone Encounter (Signed)
CSW placed call to [name] to f/u on referral to [facility] services for the assessment for DME needs.  Pt states HH PT came but was under the impression pt was to receive strengthening exercises.  [name] states at the time of assessment [facility] PT recommending HH OT.  [name] currently living in a smaller apartment with shower only and small, low toilet seat.  [name] main concern that she is not able to effectively wash body parts without soaking in the tub. Pt complains after showering, she still is noticing that she is not able to clean areas between skin folds.  In addition, [name] voiced concern that it is increasingly difficulty for toilet hygiene because her current toilet is too low.  Pt applied to move to a larger apartment with a tub but that apartment had already been rented out.  [name] is planning to move to another apartment complex that has a tub.  Pt would like to stop home health services at this time, stating she plans to move and would like to know more about DME once she is in her new apartment.  CSW informed [name] of the option of a raised toilet seat and a tub/transfer bench as options once she has moved.  Pt notified insurance may not cover bathroom items, and pt would need to inquire with her specific policy.  [name] would like to resume the assessment of DME needs once she moves.  CSW provided [name] with the number to [facility] and encouraged pt to contact [facility] and notify agency that pt would like to decline continuation of services at this time.  Pt agreeable and denies add'l social work needs at this time.  Pt provided with CSW contact information and is aware CSW is available to assist as needed.

## 2015-05-01 ENCOUNTER — Ambulatory Visit
Admission: RE | Admit: 2015-05-01 | Discharge: 2015-05-01 | Disposition: A | Discharge status: Home / Self Care | Payer: Commercial Managed Care - HMO | Source: Ambulatory Visit | Attending: Internal Medicine | Admitting: Internal Medicine

## 2015-05-01 ENCOUNTER — Other Ambulatory Visit: Payer: Self-pay

## 2015-05-01 DIAGNOSIS — H25013 Cortical age-related cataract, bilateral: Secondary | ICD-10-CM | POA: Diagnosis not present

## 2015-05-01 DIAGNOSIS — Z1231 Encounter for screening mammogram for malignant neoplasm of breast: Secondary | ICD-10-CM

## 2015-05-04 ENCOUNTER — Telehealth: Payer: Self-pay | Admitting: Licensed Clinical Social Worker

## 2015-05-04 NOTE — Telephone Encounter (Signed)
Discussion with PCP regarding [name] request for Reasonable Accommodation.  PCP requesting pt to have a completed safety eval noting that pt would be safe in an apartment with a bathtub.  CSW placed called to pt.  CSW left message requesting return call. CSW provided contact hours and phone number.

## 2015-05-09 NOTE — Telephone Encounter (Signed)
Ms. Sharol HarnessSimmons returned call to CSW.  Pt voiced concern as to how safety eval will be performed in the home as her current apartment does not have a bathtub.  Pt notified this worker will explore appropriate referral options for bathtub fall risk safety evaluation and discuss options with patient.  Ms. Sharol HarnessSimmons did indicate she would be able to attend an outpatient appointment if needed.  CSW placed call to [facility], office to have OT return call to this worker.

## 2015-05-16 NOTE — Telephone Encounter (Signed)
CSW placed call to [facility] to confirm if pt would be able to receive evaluation and treatment for safety using a bathtub.  CSW confirmed if pt is not safe getting in/out of bathtub this would be a goal pt would like to work towards.  Office confirmed and notified for [facility] for [facility] eval and treatment.  CSW will notify PCP.

## 2015-05-17 ENCOUNTER — Other Ambulatory Visit (INDEPENDENT_AMBULATORY_CARE_PROVIDER_SITE_OTHER): Payer: Commercial Managed Care - HMO

## 2015-05-17 DIAGNOSIS — Z1211 Encounter for screening for malignant neoplasm of colon: Secondary | ICD-10-CM | POA: Diagnosis not present

## 2015-05-17 DIAGNOSIS — Z0000 Encounter for general adult medical examination without abnormal findings: Secondary | ICD-10-CM

## 2015-05-17 LAB — POC HEMOCCULT BLD/STL (HOME/3-CARD/SCREEN)
Card #2 Fecal Occult Blod, POC: NEGATIVE
Card #3 Fecal Occult Blood, POC: NEGATIVE
Fecal Occult Blood, POC: NEGATIVE

## 2015-05-18 ENCOUNTER — Other Ambulatory Visit: Payer: Self-pay | Admitting: Internal Medicine

## 2015-05-18 DIAGNOSIS — Z7189 Other specified counseling: Secondary | ICD-10-CM

## 2015-05-21 ENCOUNTER — Encounter: Payer: Self-pay | Admitting: Internal Medicine

## 2015-05-21 ENCOUNTER — Ambulatory Visit (INDEPENDENT_AMBULATORY_CARE_PROVIDER_SITE_OTHER): Payer: Commercial Managed Care - HMO | Admitting: Internal Medicine

## 2015-05-21 VITALS — BP 142/74 | HR 94 | Temp 98.8°F | Ht 67.0 in | Wt 370.2 lb

## 2015-05-21 DIAGNOSIS — R05 Cough: Secondary | ICD-10-CM

## 2015-05-21 DIAGNOSIS — J3489 Other specified disorders of nose and nasal sinuses: Secondary | ICD-10-CM

## 2015-05-21 DIAGNOSIS — J069 Acute upper respiratory infection, unspecified: Secondary | ICD-10-CM

## 2015-05-21 MED ORDER — GUAIFENESIN-CODEINE 100-10 MG/5ML PO SOLN: 10.0000 mL | ORAL | Status: DC | PRN

## 2015-05-21 NOTE — Patient Instructions (Signed)
1. Please return in 1 week for follow up.  2. Please take all medications as previously prescribed with the following changes:  Use Robitussin AC 10 mL every 4 hours as needed for cough.   3. If you have worsening of your symptoms or new symptoms arise, please call the clinic [phone]), or go to the ER immediately if symptoms are severe.  Viral Infections A viral infection can be caused by different types of viruses.Most viral infections are not serious and resolve on their own. However, some infections may cause severe symptoms and may lead to further complications. SYMPTOMS Viruses can frequently cause:  Minor sore throat.  Aches and pains.  Headaches.  Runny nose.  Different types of rashes.  Watery eyes.  Tiredness.  Cough.  Loss of appetite.  Gastrointestinal infections, resulting in nausea, vomiting, and diarrhea. These symptoms do not respond to antibiotics because the infection is not caused by bacteria. However, you might catch a bacterial infection following the viral infection. This is sometimes called a "superinfection." Symptoms of such a bacterial infection may include:  Worsening sore throat with pus and difficulty swallowing.  Swollen neck glands.  Chills and a high or persistent fever.  Severe headache.  Tenderness over the sinuses.  Persistent overall ill feeling (malaise), muscle aches, and tiredness (fatigue).  Persistent cough.  Yellow, green, or brown mucus production with coughing. HOME CARE INSTRUCTIONS   Only take over-the-counter or prescription medicines for pain, discomfort, diarrhea, or fever as directed by your caregiver.  Drink enough water and fluids to keep your urine clear or pale yellow. Sports drinks can provide valuable electrolytes, sugars, and hydration.  Get plenty of rest and maintain proper nutrition. Soups and broths with crackers or rice are fine. SEEK IMMEDIATE MEDICAL CARE IF:   You have severe headaches,  shortness of breath, chest pain, neck pain, or an unusual rash.  You have uncontrolled vomiting, diarrhea, or you are unable to keep down fluids.  You or your child has an oral temperature above 102 F (38.9 C), not controlled by medicine.  Your baby is older than 3 months with a rectal temperature of 102 F (38.9 C) or higher.  Your baby is 663 months old or younger with a rectal temperature of 100.4 F (38 C) or higher. MAKE SURE YOU:   Understand these instructions.  Will watch your condition.  Will get help right away if you are not doing well or get worse.   This information is not intended to replace advice given to you by your health care provider. Make sure you discuss any questions you have with your health care provider.   Document Released: 11/27/2004 Document Revised: 05/12/2011 Document Reviewed: 07/26/2014 Elsevier Interactive Patient Education [employer]2016 Elsevier Inc.

## 2015-05-21 NOTE — Progress Notes (Signed)
Subjective:   Patient ID: [name] female   DOB: 1945-07-26 70 y.o.   MRN: [id number]  HPI: [name] is a 70 y.o. female w/ PMHx of HTN, HLD, depression, CKD stage 3, OSA, presents to the clinic today for an acute visit for cough. Patient says she has had a cough for 2-3 days, accompanied by some yellow phlegm and scant blood. She denies any SOB, chest pain, fevers, chills, nausea, malaise, myalgias, or arthralgias. She also admits to sinus pressure and congestion. She does admit to recent sick contacts, says her friend was sick last week with the same issues.    Past Medical History  Diagnosis Date  . Depression     followed by Dr. Evelene Croon  . Diverticulosis of colon   . History of hepatitis B   . Hypertension   . Arthritis     osteoarthritis , kness.  [name] History of pulmonary embolism 01/2002  . Obesity   . Fibrocystic breast disease   . Obstructive sleep apnea     refuses CPAP.  [name] Hyperlipidemia   . Chronic kidney disease     Renal insuffucinecy, cr-1.33 in 10/2005.  [name] Left shoulder pain     2/2 fall  . Neck nodule     not erythematous, movable like cyst located at the base of posterior neck on the left side(not painful), will need follow up for   . Pulmonary nodule     Stable on repeat imaging. LLL nodule 5 mm   Current Outpatient Prescriptions  Medication Sig Dispense Refill  . ALPRAZolam (XANAX) 1 MG tablet Take 1 mg by mouth at bedtime as needed for sleep. Take 1.5 tab by mouth everynight.    [name] amLODipine (NORVASC) 5 MG tablet Take 1 tablet (5 mg total) by mouth daily. 90 tablet 3  . buPROPion (WELLBUTRIN) 100 MG tablet Take 100 mg by mouth 3 (three) times daily.    . hydrochlorothiazide (HYDRODIURIL) 25 MG tablet Take 1 tablet (25 mg total) by mouth daily. 90 tablet 3  . mirabegron ER (MYRBETRIQ) 25 MG TB24 tablet Take 1 tablet (25 mg total) by mouth daily. 90 tablet 1  . Multiple Vitamin (MULTIVITAMIN) tablet Take 1 tablet by mouth daily. 30 tablet  3  . penicillin v potassium (VEETID) 500 MG tablet Take 1 tablet (500 mg total) by mouth 4 (four) times daily. 40 tablet 0  . potassium chloride (K-DUR) 10 MEQ tablet Take 1 tablet (10 mEq total) by mouth 2 (two) times daily. 180 tablet 3  . pravastatin (PRAVACHOL) 40 MG tablet TAKE 1 TABLET EVERY DAY 90 tablet 3  . XARELTO 20 MG TABS tablet TAKE 1 TABLET EVERY DAY WITH SUPPER 90 tablet 3   No current facility-administered medications for this visit.    Review of Systems: General: Denies fever, chills, diaphoresis, appetite change and fatigue.  Respiratory: Positive for cough and congestion. Denies SOB, DOE, and wheezing.   Cardiovascular: Denies chest pain and palpitations.  Gastrointestinal: Denies nausea, vomiting, abdominal pain, and diarrhea.  Genitourinary: Denies dysuria, increased frequency, and flank pain. Endocrine: Denies hot or cold intolerance, polyuria, and polydipsia. Musculoskeletal: Denies myalgias, back pain, joint swelling, arthralgias and gait problem.  Skin: Denies pallor, rash and wounds.  Neurological: Denies dizziness, seizures, syncope, weakness, lightheadedness, numbness and headaches.  Psychiatric/Behavioral: Denies mood changes, and sleep disturbances.  Objective:   Physical Exam: Filed Vitals:   05/21/15 1355  BP: 142/74  Pulse: 94  Temp: 98.8 F (37.1 C)  TempSrc: Oral  Height: 5\' 7"  (1.702 m)  Weight: 370 lb 3.2 oz (167.922 kg)  SpO2: 97%    General: Obese AA female, alert, cooperative, NAD. HEENT: PERRL, EOMI. Moist mucus membranes. Mild pharyngeal erythema, no exudates or swelling.  Neck: Full range of motion without pain, supple, no lymphadenopathy or carotid bruits Lungs: Clear to ascultation bilaterally, normal work of respiration, no wheezes, rales, rhonchi Heart: RRR, no murmurs, gallops, or rubs Abdomen: Soft, non-tender, non-distended, BS + Extremities: No cyanosis, clubbing, or edema Neurologic: Alert & oriented X3, cranial nerves  II-XII intact, strength grossly intact, sensation intact to light touch   Assessment & Plan:   Please see problem based assessment and plan.

## 2015-05-22 ENCOUNTER — Telehealth: Payer: Self-pay | Admitting: Licensed Clinical Social Worker

## 2015-05-22 ENCOUNTER — Telehealth: Payer: Self-pay | Admitting: Internal Medicine

## 2015-05-22 ENCOUNTER — Ambulatory Visit: Payer: Commercial Managed Care - HMO | Admitting: Occupational Therapy

## 2015-05-22 NOTE — Telephone Encounter (Signed)
Thank you :)

## 2015-05-22 NOTE — Telephone Encounter (Signed)
CSW received call from Ms. Mcentee.  Pt confirmed with CSW as to need to have assessment for home safety prior to PCP completing reasonable accomodation form.  Pt states she received a call from [facility] regarding her outpatient appointment.  [facility] voiced concern that eval would not be a complete assessment as [facility] would not have the ability to assess patient using grab bars.  Pt states bathroom available at new apartment would have grab bars.  Outpatient appointment would only reflect patient's ability getting up and down without using grab bar assistance.  OT recommended pt to inquire with apartment manager about using an available apartment with a tub [facility].  [facility], pt declined as she needed to confirm plan.  Pt states she has a neighbor that has a tub and potentially could use for assessment.  CSW requested pt to confirm with neighbor about using bathtub for assessment.  Pt to notify CSW if able to utilize and neighbors address.

## 2015-05-22 NOTE — Telephone Encounter (Signed)
What about Hycodan?

## 2015-05-22 NOTE — Telephone Encounter (Signed)
Pt states guaifenesin-codeine is not covered by the insurance.

## 2015-05-22 NOTE — Telephone Encounter (Signed)
Pt concerned her call did not get forwarded to correct staff.  CSW reassured [name] call was logged and routed to Refill nurse.  Pt states she attempted to contact [facility] but was on hold too long.  CSW suggested potential to have pharmacy student/resident inquire about coverage but call has been routed to refill RN for follow up.  Encouraged pt to allow time for follow up.  Pt thanked Loss adjuster, charteredthis worker.

## 2015-05-22 NOTE — Progress Notes (Signed)
Internal Medicine Clinic Attending  Case discussed with Dr. Jones at the time of the visit.  We reviewed the resident's history and exam and pertinent patient test results.  I agree with the assessment, diagnosis, and plan of care documented in the resident's note.  

## 2015-05-22 NOTE — Assessment & Plan Note (Signed)
Patient with symptoms suggestive of viral upper respiratory tract infection. Describes sinus congestion, rhinorrhea, cough productive of scant yellow sputum and some streaks of blood. No fever, chills, myalgias, nausea, or malaise. No SOB. Suspect she has a viral bronchitis and post-nasal drip from sinus congestion. She has been coughing a lot at home and says this has caused her throat to be somewhat sore. On exam, mild pharyngeal erythema, lungs clear, no lymphadenopathy. Cough does have scant yellow sputum with a small amount of blood, however, I believe this is related to bronchial irritation due to coughing. Described using neti pot, saline nasal spray, rest, hot tea.  -Given Rx for Guaifenesin-codeine 10 mL q4h prn for cough and congestion.  -RTC in 1 week for follow up -Explained if patient develops worsening bleeding with cough, SOB, significant chest pain, or severe worsening of symptoms, she is to return sooner for further workup including CXR, CBC, etc.

## 2015-05-23 ENCOUNTER — Other Ambulatory Visit: Payer: Self-pay | Admitting: Licensed Clinical Social Worker

## 2015-05-23 NOTE — Telephone Encounter (Signed)
CSW received call from Ms. Delaney.  Pt has secured a location 2 doors down from her apartment for evaluation of bath tub safety and concern regarding fall risk and usage of adaptive equipment.  Pt states she informed [facility] that she no longer required their services.  [facility]  Pt agreeable with referral to [facility].

## 2015-05-24 ENCOUNTER — Other Ambulatory Visit: Payer: Self-pay | Admitting: *Deleted

## 2015-05-28 ENCOUNTER — Ambulatory Visit (INDEPENDENT_AMBULATORY_CARE_PROVIDER_SITE_OTHER): Payer: Commercial Managed Care - HMO | Admitting: Internal Medicine

## 2015-05-28 ENCOUNTER — Encounter: Payer: Self-pay | Admitting: Internal Medicine

## 2015-05-28 VITALS — BP 151/79 | HR 88 | Temp 98.3°F | Ht 67.0 in | Wt 369.0 lb

## 2015-05-28 DIAGNOSIS — J069 Acute upper respiratory infection, unspecified: Secondary | ICD-10-CM | POA: Diagnosis not present

## 2015-05-28 NOTE — Progress Notes (Signed)
Subjective:   Patient ID: Patricia DunkerCelestine T [name] female   DOB: August 17, 1945 70 y.o.   MRN: [id number]  HPI: [name] is a 70 y.o. female w/ PMHx of HTN, HLD, depression, CKD stage 3, OSA, presents to the clinic today for a follow up visit for cough. Says her cough is resolving, no further scant blood in herr sputum. Feels her congestion and symptoms are no longer causing her trouble. No fever, chills, SOB, or chest pain.   Past Medical History  Diagnosis Date  . Depression     followed by Dr. Evelene CroonKaur  . Diverticulosis of colon   . History of hepatitis B   . Hypertension   . Arthritis     osteoarthritis , kness.  [name]. History of pulmonary embolism 01/2002  . Obesity   . Fibrocystic breast disease   . Obstructive sleep apnea     refuses CPAP.  [name]. Hyperlipidemia   . Chronic kidney disease     Renal insuffucinecy, cr-1.33 in 10/2005.  [name]. Left shoulder pain     2/2 fall  . Neck nodule     not erythematous, movable like cyst located at the base of posterior neck on the left side(not painful), will need follow up for   . Pulmonary nodule     Stable on repeat imaging. LLL nodule 5 mm   Current Outpatient Prescriptions  Medication Sig Dispense Refill  . ALPRAZolam (XANAX) 1 MG tablet Take 1 mg by mouth at bedtime as needed for sleep. Take 1.5 tab by mouth everynight.    [name]. amLODipine (NORVASC) 5 MG tablet Take 1 tablet (5 mg total) by mouth daily. 90 tablet 3  . buPROPion (WELLBUTRIN) 100 MG tablet Take 100 mg by mouth 3 (three) times daily.    [name]. guaiFENesin-codeine 100-10 MG/5ML syrup Take 10 mLs by mouth every 4 (four) hours as needed for cough. 120 mL 0  . hydrochlorothiazide (HYDRODIURIL) 25 MG tablet Take 1 tablet (25 mg total) by mouth daily. 90 tablet 3  . mirabegron ER (MYRBETRIQ) 25 MG TB24 tablet Take 1 tablet (25 mg total) by mouth daily. 90 tablet 1  . Multiple Vitamin (MULTIVITAMIN) tablet Take 1 tablet by mouth daily. 30 tablet 3  . penicillin v potassium (VEETID) 500  MG tablet Take 1 tablet (500 mg total) by mouth 4 (four) times daily. 40 tablet 0  . potassium chloride (K-DUR) 10 MEQ tablet Take 1 tablet (10 mEq total) by mouth 2 (two) times daily. 180 tablet 3  . pravastatin (PRAVACHOL) 40 MG tablet TAKE 1 TABLET EVERY DAY 90 tablet 3  . XARELTO 20 MG TABS tablet TAKE 1 TABLET EVERY DAY WITH SUPPER 90 tablet 3   No current facility-administered medications for this visit.    Review of Systems: General: Denies fever, chills, diaphoresis, appetite change and fatigue.  Respiratory: Denies SOB, DOE, cough, and wheezing.   Cardiovascular: Denies chest pain and palpitations.  Gastrointestinal: Denies nausea, vomiting, abdominal pain, and diarrhea.  Genitourinary: Denies dysuria, increased frequency, and flank pain. Endocrine: Denies hot or cold intolerance, polyuria, and polydipsia. Musculoskeletal: Denies myalgias, back pain, joint swelling, arthralgias and gait problem.  Skin: Denies pallor, rash and wounds.  Neurological: Denies dizziness, seizures, syncope, weakness, lightheadedness, numbness and headaches.  Psychiatric/Behavioral: Denies mood changes, and sleep disturbances.  Objective:   Physical Exam: Filed Vitals:   05/28/15 1559  BP: 151/79  Pulse: 88  Temp: 98.3 F (36.8 C)  TempSrc: Oral  Height: 5\' 7"  (1.702 m)  Weight: 369 lb (167.377 kg)  SpO2: 96%    General: Obese AA female, alert, cooperative, NAD. HEENT: PERRL, EOMI. Moist mucus membranes. Mild pharyngeal erythema, no exudates or swelling.  Neck: Full range of motion without pain, supple, no lymphadenopathy or carotid bruits Lungs: Clear to ascultation bilaterally, normal work of respiration, no wheezes, rales, rhonchi Heart: RRR, no murmurs, gallops, or rubs Abdomen: Soft, non-tender, non-distended, BS + Extremities: No cyanosis, clubbing, or edema Neurologic: Alert & oriented X3, cranial nerves II-XII intact, strength grossly intact, sensation intact to light  touch   Assessment & Plan:   Please see problem based assessment and plan.

## 2015-05-28 NOTE — Patient Instructions (Signed)
1. Please schedule a follow up appointment for 07/12/15 @ 8:15 AM.   2. Please take all medications as previously prescribed.  3. If you have worsening of your symptoms or new symptoms arise, please call the clinic [phone]), or go to the ER immediately if symptoms are severe.  Upper Respiratory Infection, Adult Most upper respiratory infections (URIs) are a viral infection of the air passages leading to the lungs. A URI affects the nose, throat, and upper air passages. The most common type of URI is nasopharyngitis and is typically referred to as "the common cold." URIs run their course and usually go away on their own. Most of the time, a URI does not require medical attention, but sometimes a bacterial infection in the upper airways can follow a viral infection. This is called a secondary infection. Sinus and middle ear infections are common types of secondary upper respiratory infections. Bacterial pneumonia can also complicate a URI. A URI can worsen asthma and chronic obstructive pulmonary disease (COPD). Sometimes, these complications can require emergency medical care and may be life threatening.  CAUSES Almost all URIs are caused by viruses. A virus is a type of germ and can spread from one person to another.  RISKS FACTORS You may be at risk for a URI if:   You smoke.   You have chronic heart or lung disease.  You have a weakened defense (immune) system.   You are very young or very old.   You have nasal allergies or asthma.  You work in crowded or poorly ventilated areas.  You work in health care facilities or schools. SIGNS AND SYMPTOMS  Symptoms typically develop 2-3 days after you come in contact with a cold virus. Most viral URIs last 7-10 days. However, viral URIs from the influenza virus (flu virus) can last 14-18 days and are typically more severe. Symptoms may include:   Runny or stuffy (congested) nose.   Sneezing.   Cough.   Sore throat.   Headache.    Fatigue.   Fever.   Loss of appetite.   Pain in your forehead, behind your eyes, and over your cheekbones (sinus pain).  Muscle aches.  DIAGNOSIS  Your health care provider may diagnose a URI by:  Physical exam.  Tests to check that your symptoms are not due to another condition such as:  Strep throat.  Sinusitis.  Pneumonia.  Asthma. TREATMENT  A URI goes away on its own with time. It cannot be cured with medicines, but medicines may be prescribed or recommended to relieve symptoms. Medicines may help:  Reduce your fever.  Reduce your cough.  Relieve nasal congestion. HOME CARE INSTRUCTIONS   Take medicines only as directed by your health care provider.   Gargle warm saltwater or take cough drops to comfort your throat as directed by your health care provider.  Use a warm mist humidifier or inhale steam from a shower to increase air moisture. This may make it easier to breathe.  Drink enough fluid to keep your urine clear or pale yellow.   Eat soups and other clear broths and maintain good nutrition.   Rest as needed.   Return to work when your temperature has returned to normal or as your health care provider advises. You may need to stay home longer to avoid infecting others. You can also use a face mask and careful hand washing to prevent spread of the virus.  Increase the usage of your inhaler if you have asthma.  Do not use any tobacco products, including cigarettes, chewing tobacco, or electronic cigarettes. If you need help quitting, ask your health care provider. PREVENTION  The best way to protect yourself from getting a cold is to practice good hygiene.   Avoid oral or hand contact with people with cold symptoms.   Wash your hands often if contact occurs.  There is no clear evidence that vitamin C, vitamin E, echinacea, or exercise reduces the chance of developing a cold. However, it is always recommended to get plenty of rest,  exercise, and practice good nutrition.  SEEK MEDICAL CARE IF:   You are getting worse rather than better.   Your symptoms are not controlled by medicine.   You have chills.  You have worsening shortness of breath.  You have brown or red mucus.  You have yellow or brown nasal discharge.  You have pain in your face, especially when you bend forward.  You have a fever.  You have swollen neck glands.  You have pain while swallowing.  You have white areas in the back of your throat. SEEK IMMEDIATE MEDICAL CARE IF:   You have severe or persistent:  Headache.  Ear pain.  Sinus pain.  Chest pain.  You have chronic lung disease and any of the following:  Wheezing.  Prolonged cough.  Coughing up blood.  A change in your usual mucus.  You have a stiff neck.  You have changes in your:  Vision.  Hearing.  Thinking.  Mood. MAKE SURE YOU:   Understand these instructions.  Will watch your condition.  Will get help right away if you are not doing well or get worse.   This information is not intended to replace advice given to you by your health care provider. Make sure you discuss any questions you have with your health care provider.   Document Released: 08/13/2000 Document Revised: 07/04/2014 Document Reviewed: 05/25/2013 Elsevier Interactive Patient Education [employer].

## 2015-05-29 NOTE — Assessment & Plan Note (Signed)
Patient doing quite well today, feels her cough is almost completely resolved and her sinus congestion has also resolved. Appetite normal, no SOB, chest pain, or blood in herr sputum.  -No further management at this time -Patient will return in 07/2015 for follow up visit with PCP

## 2015-05-30 NOTE — Progress Notes (Signed)
Internal Medicine Clinic Attending  Case discussed with Dr. Jones at the time of the visit.  We reviewed the resident's history and exam and pertinent patient test results.  I agree with the assessment, diagnosis, and plan of care documented in the resident's note.  

## 2015-05-31 ENCOUNTER — Telehealth: Payer: Self-pay | Admitting: Internal Medicine

## 2015-05-31 NOTE — Telephone Encounter (Signed)
FAXED OVER FORM FOR TRANSFER/[phone]

## 2015-05-31 NOTE — Addendum Note (Signed)
Addended by: Neomia DearPOWERS, Raeley Gilmore E on: 05/31/2015 05:14 PM   Modules accepted: Orders

## 2015-06-01 NOTE — Telephone Encounter (Signed)
Called and LVM for Danielle at [phone]- generic message left as unsure her role/involvement with patient

## 2015-06-12 ENCOUNTER — Telehealth: Payer: Self-pay | Admitting: Licensed Clinical Social Worker

## 2015-06-12 NOTE — Telephone Encounter (Signed)
Ms. Sharol HarnessSimmons placed call to CSW to notify this worker, pt has completed their eval and inquiring about request for reasonable accomodation.  CSW checked pt's chart and physician's mailbox, [facility].  CSW returned call to Ms. Sharol HarnessSimmons, notified physician awaiting receipt of HH PT eval.  [facility] request eval to be faxed to PCP's attn [phone].  Reasonable accomodation form will be attached to incoming fax from [facility] and placed in PCP's mailbox.  Once received CSW will notify PCP.

## 2015-06-13 NOTE — Telephone Encounter (Signed)
[facility]. [facility]ol [facility]Simmons [facility].  CSW placed eval along with request for reasonable accomodation in PCP's mailbox.

## 2015-06-20 NOTE — Telephone Encounter (Signed)
Signed forms received and mailed to [employer] per form instructions.

## 2015-06-20 NOTE — Telephone Encounter (Signed)
Pt notified signed form was mailed out on 06/13/15.  Pt requesting copy to be mailed to her.  Copy placed in mail to patient.

## 2015-07-04 ENCOUNTER — Telehealth: Payer: Self-pay | Admitting: *Deleted

## 2015-07-04 NOTE — Telephone Encounter (Signed)
Call from patient stating the housing paperwork our office completed did not mention the need for her to have a 2BR apartment.  Pt states the paperwork only mentioned the need for her to have a tub in her apartment.  Pt feels she needs an extra bedroom in order to "enjoy" her apartment.  States she has severe depression and anxiety and spends most of her days inside.  She also has "medical equipment and supplies" that she needs to store separately from her bedroom as the additional "stuff" in her bedroom has created a fall hazard.  She is hoping to have pcp complete paperwork asap supporting her need for a 2BR.  Pt states a 2BR apartment has just become available and they get gone quickly.    [employer] was contacted and a form was faxed to our office for completion.  The paperwork states the request for a bathtub with safety rails has already been approved, but they need to know why an additional bedroom is indicated.  Will place paperwork in pcp's box for completion.  Please advise.Criss AlvineGoldston, [name]

## 2015-07-04 NOTE — Telephone Encounter (Signed)
I cannot testify that it is medically necessary that she have a 2 bedroom apt. She only ever mentioned to me the need for a tub rather than stand up shower - documented in notes. I understand that she has sig depression and anxiety. However, I do not know what med equipment and supplies she is referring to that would require an entire separate bedroom to house. She has repeated stated to me that she spends almost all day in her bed in her bedroom and therefore, would not need a larger apt.   I cannot fulfill her request.

## 2015-07-05 ENCOUNTER — Other Ambulatory Visit: Payer: Self-pay | Admitting: Internal Medicine

## 2015-07-06 NOTE — Addendum Note (Signed)
Addended by: Blanch MediaBUTCHER, ELIZABETH A on: 07/06/2015 12:04 PM   Modules accepted: Orders

## 2015-07-12 ENCOUNTER — Encounter: Payer: Self-pay | Admitting: Internal Medicine

## 2015-07-12 ENCOUNTER — Ambulatory Visit (INDEPENDENT_AMBULATORY_CARE_PROVIDER_SITE_OTHER): Payer: Commercial Managed Care - HMO | Admitting: Internal Medicine

## 2015-07-12 VITALS — BP 149/80 | HR 79 | Temp 98.2°F | Wt 376.6 lb

## 2015-07-12 DIAGNOSIS — Z0000 Encounter for general adult medical examination without abnormal findings: Secondary | ICD-10-CM

## 2015-07-12 DIAGNOSIS — I272 Other secondary pulmonary hypertension: Secondary | ICD-10-CM | POA: Diagnosis not present

## 2015-07-12 DIAGNOSIS — Z6841 Body Mass Index (BMI) 40.0 and over, adult: Secondary | ICD-10-CM | POA: Diagnosis not present

## 2015-07-12 DIAGNOSIS — E785 Hyperlipidemia, unspecified: Secondary | ICD-10-CM

## 2015-07-12 DIAGNOSIS — R32 Unspecified urinary incontinence: Secondary | ICD-10-CM | POA: Diagnosis not present

## 2015-07-12 DIAGNOSIS — I10 Essential (primary) hypertension: Secondary | ICD-10-CM

## 2015-07-12 DIAGNOSIS — N3941 Urge incontinence: Secondary | ICD-10-CM

## 2015-07-12 MED ORDER — AMLODIPINE BESYLATE 10 MG PO TABS: 5.0000 mg | ORAL_TABLET | Freq: Every day | ORAL | Status: DC

## 2015-07-12 MED ORDER — MIRABEGRON ER 50 MG PO TB24: 50.0000 mg | ORAL_TABLET | Freq: Every day | ORAL | Status: DC

## 2015-07-12 MED ORDER — HYDROCHLOROTHIAZIDE 25 MG PO TABS: 25.0000 mg | ORAL_TABLET | Freq: Every day | ORAL | Status: DC | PRN

## 2015-07-12 NOTE — Assessment & Plan Note (Signed)
She had mentioned in passing last appt about fecal incontinence but expanded on it today. Sometimes, she feels the need to move her bowels and she goes and has a BM - today, in AM, one nl, but then just some stool on TP twice. Other times, she will be sitting on couch and then feel something in her underwear and go to bathroom and noticed she had soiled herself. She has noticed that these episodes are related to her diet. When it is poor and low in fibre and she plans to start supplementation. Again, I do not get the feel that this represents spinal cord dz.   PLAN  : OTC fibre Hep C Ab screening

## 2015-07-12 NOTE — Assessment & Plan Note (Addendum)
[name] in 2014 Oct was 370. Lost to 345 in May 2015. Now up to 276 lbs. Mostly 2/2 depression which is uncontrolled. Works with mental health. She has declined [name] referral in past bc she knows what to eat.  Last appt she mentioned needing an apartment with a tub. She did not mention needing a 2 bedroom. When the paperwork came, I stated only the need for the tub. She called and [name] got that the one bedroom is small and cannot fit her hospital type bed, CPAP, and bedside toilet. I then put in referral for home health to ensure that this need is valid and that she has a safe environment. She is concerned bc two bedrooms do not come up often and that she will miss out on this one. [name] states it takes a few days for this eval to be completed. To compromise, I stated I would provide letter stating need, but that she still needed to complete the eval.  PLAN : letter to pt and housing authority Home health eval cont to follow weight

## 2015-07-12 NOTE — Assessment & Plan Note (Signed)
  BP Readings from Last 3 Encounters:  07/12/15 149/80  05/28/15 151/79  05/21/15 142/74   Her BP remains elevated on Norvasc 5 mg. She states she is now taking QD. She is not taking the HCTZ - increased urinary issues and no vol overload.   PLAN : increase norvasc to 10 mg QD HCTZ prn

## 2015-07-12 NOTE — Progress Notes (Signed)
   Subjective:    Patient ID: [name], female    DOB: [birth year], 70 y.o.   MRN: [id number]  HPI  [name] is here for HTN F/U. Please see the A&P for the status of the pt's chronic medical problems.  ROS : per ROS section and in problem oriented charting. All other systems are negative. PMHx, Soc hx, and / or Fam hx : Lives alone. Had one live birth.  Review of Systems  Constitutional: Positive for unexpected weight change. Negative for activity change and appetite change.  Gastrointestinal:       Loose stools intermittently. Some incontinence.  Genitourinary: Positive for urgency and difficulty urinating.  Psychiatric/Behavioral: Positive for sleep disturbance. The patient is nervous/anxious.        Sig depression. Passive SI. No active plans but knows could take all Ativan and end it. Sometimes wants to do that. But knows that bc of her religion she is not allowed to and would end up in worse place.        Objective:   Physical Exam  Constitutional: She is oriented to person, place, and time. She appears well-developed and well-nourished. She appears distressed.  Obese  HENT:  Head: Normocephalic and atraumatic.  Right Ear: External ear normal.  Left Ear: External ear normal.  Nose: Nose normal.  Neurological: She is alert and oriented to person, place, and time.  Skin: She is not diaphoretic.  Psychiatric: Her behavior is normal. Judgment normal. Her mood appears anxious. Her affect is not angry, not blunt, not labile and not inappropriate. Her speech is not rapid and/or pressured, not delayed, not tangential and not slurred. She is not agitated, not aggressive, not hyperactive, not slowed, not withdrawn, not actively hallucinating and not combative. Thought content is not paranoid and not delusional. Cognition and memory are normal. Cognition and memory are not impaired. She does not express impulsivity or inappropriate judgment. She exhibits a depressed  mood. She expresses suicidal ideation. She expresses no suicidal plans. She is communicative. She exhibits normal recent memory.  Good eye contact.Tearful. Expresses herself well. She is attentive.          Assessment & Plan:

## 2015-07-12 NOTE — Assessment & Plan Note (Addendum)
Discussed at last appt and started Mirabegron 25 mg. For first week, she thought it was a miracle drug. But the, the urgency and incontinence resumed. If at home, and she goes to toilet at the first inkling of needing to urinate, she is OK. But outside of home, she cannot control it. Using diapers QD now. Leaked through diaper once. With some fecal incontinence also, worry about spinal cord abnl but no consistent sxs, other etiology more likely,  no LE weakness. We talked about most likely dx- urge incontinence and how obesity plays into this. We talked about tx options - increased dose mirabegron, referral, just using adult diapers.  PLA N : Increase mirabegron to 50 QD

## 2015-07-12 NOTE — Patient Instructions (Signed)
I will work on paperwork today

## 2015-07-13 ENCOUNTER — Telehealth: Payer: Self-pay | Admitting: Licensed Clinical Social Worker

## 2015-07-13 ENCOUNTER — Other Ambulatory Visit: Payer: Self-pay | Admitting: Licensed Clinical Social Worker

## 2015-07-13 LAB — CMP14 + ANION GAP
ALT: 20 IU/L (ref 0–32)
ANION GAP: 21 mmol/L — AB (ref 10.0–18.0)
AST: 24 IU/L (ref 0–40)
Albumin/Globulin Ratio: 1.5 (ref 1.2–2.2)
Albumin: 4.4 g/dL (ref 3.6–4.8)
Alkaline Phosphatase: 82 IU/L (ref 39–117)
BUN/Creatinine Ratio: 15 (ref 12–28)
BUN: 15 mg/dL (ref 8–27)
Bilirubin Total: 0.4 mg/dL (ref 0.0–1.2)
CO2: 22 mmol/L (ref 18–29)
CREATININE: 1.02 mg/dL — AB (ref 0.57–1.00)
Calcium: 9.2 mg/dL (ref 8.7–10.3)
Chloride: 99 mmol/L (ref 96–106)
GFR calc Af Amer: 65 mL/min/{1.73_m2} (ref >59)
GFR calc non Af Amer: 57 mL/min/{1.73_m2} — ABNORMAL LOW (ref >59)
GLOBULIN, TOTAL: 3 g/dL (ref 1.5–4.5)
Glucose: 115 mg/dL — ABNORMAL HIGH (ref 65–99)
Potassium: 3.9 mmol/L (ref 3.5–5.2)
SODIUM: 142 mmol/L (ref 134–144)
Total Protein: 7.4 g/dL (ref 6.0–8.5)

## 2015-07-13 LAB — LIPID PANEL
CHOLESTEROL TOTAL: 164 mg/dL (ref 100–199)
Chol/HDL Ratio: 3.4 ratio units (ref 0.0–4.4)
HDL: 48 mg/dL (ref >39)
LDL CALC: 100 mg/dL — AB (ref 0–99)
Triglycerides: 79 mg/dL (ref 0–149)
VLDL Cholesterol Cal: 16 mg/dL (ref 5–40)

## 2015-07-13 LAB — HEPATITIS C ANTIBODY: Hep C Virus Ab: <0.1 s/co ratio (ref 0.0–0.9)

## 2015-07-13 NOTE — Telephone Encounter (Signed)
PCP letter mailed as additional information for [name] verification of reasonable accommodation request.

## 2015-07-17 NOTE — Telephone Encounter (Signed)
CSW placed [name] to confirm agency has received additional information that address [name] request for additional bedroom.  [name] states [facility] can not accept documentation that is not written directly on the form provided.  Utilizing PCP's letter, CSW was able to handwrite information from PCP's letter directly on the form.  Form has [facility] to [facility], [facility]: [name] per request.  Fax number: [phone].

## 2015-07-18 ENCOUNTER — Telehealth: Payer: Self-pay | Admitting: Licensed Clinical Social Worker

## 2015-07-18 NOTE — Telephone Encounter (Signed)
There are benefits to doing it before - ensure that she does medically need a 2 bedroom to hold her medical equipment - and benefits to doing it after the move - safety in new home. Do you have opinion?

## 2015-07-18 NOTE — Telephone Encounter (Signed)
Case discussed with [facility].  [facility] performed safety eval for [name] for bathtub safety, new order placed for safety needs in home environment.  [facility] requesting this order to be either canceled/delayed until [name] moves into her new apartment, as it would be more appropriate to assess in new environment.  Potential service recommended for [facility] eval for now until patient moves to new environment.

## 2015-07-19 NOTE — Telephone Encounter (Signed)
[facility] for assessment of pt's home environment.

## 2015-07-20 ENCOUNTER — Other Ambulatory Visit: Payer: Self-pay

## 2015-07-20 DIAGNOSIS — F32A Depression, unspecified: Secondary | ICD-10-CM

## 2015-07-20 DIAGNOSIS — F329 Major depressive disorder, single episode, unspecified: Secondary | ICD-10-CM

## 2015-07-20 NOTE — Patient Outreach (Signed)
[facility] Care Management  07/20/2015  [name] [birth year] [id number]   Telephone Screen  Referral Date: 07/19/15 Referral Source: MD office( Dr. Rogelia [name]) Referral Reason: " assess home environment for needs and safety, would patient benefit from additional space"    Outreach attempt #1 to patient. Spoke with patient. Patient very resistant to verify HIPAA. Advised that RN CM could not continue call unless HIPAA verified. After much discussion with patient she consented.  Social: Patient resides in her apartment alone. States she is on section 8 housing and trying to get approved for larger apartment. Patient states she feels its not safe for her to be in such a small living space. She denies any falls or injuries. She reports that she is unable to use the walker in her apartment as it does not fit and therefore holds on to furniture to get around. She reports she was getting [facility] but did not find it beneficial as she "didn't want to do the exercises" that they told her to do.  Conditions: Patient has h/o severe depression, urinary and fecal incontinence and sleep apnea. Patient states that main medical issue is her depression which she admits to it "being out of control."  She reprots significant weight gain as a result of depression. She denies any suicidal or homicidal ideation. She states that depression has progressively gotten worse over the last year. She attributes a lot of it to her living environment. She voices that she had some good support with attending a women's support group but hasn't done so lately. She has some reservations about seeing MDs to treat illness but open to other options.   Medications: She reports taking about 10 meds. Denies any issues with affordability. [facility].  Appointment: Patient saw PCP last on 07/12/15.  Consent: [facility].   Plan: RN CM will  notify [facility] administrative assistant of case status. RN CM will send [facility] referral due to high depression screen.   Patricia Fairyoshanda Naryah Clenney, RN,BSN,CCM [facility] Care [facility] Telephonic Care Management Coordinator Direct Phone: [phone] Toll Free: [phone] Fax: [phone]'

## 2015-07-23 ENCOUNTER — Encounter: Payer: Self-pay | Admitting: *Deleted

## 2015-07-24 NOTE — Addendum Note (Signed)
Addended by: Neomia DearPOWERS, Orlie Cundari E on: 07/24/2015 06:51 PM   Modules accepted: Orders

## 2015-07-25 ENCOUNTER — Other Ambulatory Visit: Payer: Self-pay | Admitting: *Deleted

## 2015-07-25 ENCOUNTER — Encounter: Payer: Self-pay | Admitting: *Deleted

## 2015-07-25 NOTE — Patient Outreach (Signed)
[facility]) Care Management  07/25/2015  DELAINA FETSCH October 11, 1945 [id number]   CSW received a new referral on patient from Antionette Fairy, Telephonic Nurse Case Manager with [facility] Care [facility], reporting that patient would benefit from social work services and resources to assist with symptoms of Depression.  Mrs. Florance indicated that patient has "Severe Depression", scoring high on the Depression Screening Tool.  Patient admits to being interested in mental health resources and a possible referral for psychiatric services.  Patient also admitted that she needs assistance obtaining approval for Section 8 Housing, wanting to transfer to another apartment. CSW was able to make initial contact with patient today to perform phone assessment, as well as assess and assist with social work needs and services.  CSW introduced self, explained role and types of services provided through [employer] Care Management [facility] Care Management).  CSW further explained to patient that CSW works with patient's RNCM, also with [facility] Care Management, Doristine Section Florance. CSW then explained the reason for the call, indicating that Mrs. Florance thought that patient would benefit from social work services and resources to assist with counseling and supportive services, as well as a referral for psychiatric services.  CSW obtained two HIPAA compliant identifiers from patient, which included patient's name and date of birth. Patient requested a home visit with CSW, as patient felt that CSW would be able to better understand her symptoms of Depression if CSW entered her home environment.  CSW was able to schedule an initial home visit with patient for Friday, June 2nd at 10:30am.  CSW will provide patient with a list of community agencies and resources that offer counseling and supportive services.  More specifically, CSW will make a referral for patient to a psychiatrist  for medication management and a therapist for counseling services.  CSW will also prescribe and print EMMI information for patient pertaining to Depression, as well as techniques to help coping with specific symptoms. Danford Bad, BSW, MSW, LCSW  Licensed Restaurant manager, fast food Health System  Mailing [place] [address], [place], [place] [address] Physical Address-[address], [place], [place] [address] Toll Free Main # [phone] Fax # [phone] Cell # [phone]  Fax # [phone]  [email or web address] [email or web address] [employer]  Discrimination is Against the [employer]. and its subsidiaries comply with applicable Federal civil rights laws and do not discriminate on the basis of race, color, national origin, age, disability, or sex. [employer]. and its subsidiaries do not exclude people or treat them differently because of race, color, national origin, age, disability, or sex.    [employer]. and its subsidiaries provide:  . Free auxiliary aids and services, such as qualified sign language interpreters, video remote interpretation, and written information in other formats to people with disabilities when such auxiliary aids and services are necessary to ensure an equal opportunity to participate. . Free language services to people whose primary language is not English when those services are necessary to provide meaningful access, such as translated documents or oral interpretation.    If you need these services, call [phone] or if you use a TTY, call 711.   If you believe that [employer]. and its subsidiaries have failed to provide these services or discriminated in another way on the basis of race, color, national origin, age, disability, or sex, you can file a Theatre manager with:   Discrimination Grievances  [address] [address]  [place], [place] [address]  If you need help filing a grievance, call [phone] or if you use a  TTY, call 711.  You can also file a civil rights complaint with the U.S. Department of Health and Health and safety inspectorHuman Services, Office for Civil Rights electronically through the Office for Civil Rights Complaint Portal, available at [email or web address]://ocrportal.hhs.gov/ocr/portal/lobby.jsf, or by mail or phone at:   U.S. Department of Health and Human Services  [address], [place]  Room [phone], [facility]  [place] [id number]  [phone], [phone] (TDD)  Complaint forms are available at [email or web address]://www.hhs.gov/ocr/office/file/index.html             [facility] Multi-Language Interpreter Services  English: ATTENTION: If you do not speak English, language assistance services, free of charge, are available to you. Call [phone]  (TTY: 711).  Espaol (Spanish): ATENCIN: si habla espaol, tiene a su disposicin servicios gratuitos de asistencia lingstica. Llame al [phone] (TTY: 711).  ???? (Chinese): ?????????????????????????????? [phone] (TTY:711??  Ti?ng Vi?t (Vietnamese): CH : N?u b?n ni Ti?ng Vi?t, c cc d?ch v? h? tr? ngn ng? mi?n ph dnh cho b?n. G?i s? [phone] (TTY: 711).  ??? (Bermuda[place]orean): ?? : ???? ????? ?? , ?? ?? ???? ??? ???? ? ???? . [phone] (TTY: 711)??? ??? ???? .  Tagalog (Tagalog - Filipino): PAUNAWA: Kung nagsasalita ka ng Tagalog, maaari kang gumamit ng mga serbisyo ng tulong sa wika nang walang bayad. [place] sa [phone] (TTY: 711).   Azerbaijan(Russian): :      ,      .  [phone] (: 711).  Kreyl Ayisyen (JerseyFrench Creole): ATANSYON: Si w pale Abe PeopleKreyl Ayisyen, gen svis d pou lang ki disponib gratis pou ou. Rele [phone] (TTY: 711).  Cherylann BanasFranais [name](J[place]): ATTENTION : Si vous parlez franais, des services d'aide linguistique vous sont proposs gratuitement. Appelez le [phone] (ATS : 711).  Polski (Polish): UWAGA: Jeeli mwisz po  polsku, moesz skorzysta z bezpatnej pomocy jzykowej. Zadzwo pod numer [phone] (TTY: 711).  Portugus (TongaPortuguese): ATENO: Se fala portugus, encontram-se disponveis servios lingusticos, grtis. Ligue para [phone] (TTY: 711).   Italiano ([place]): ATTENZIONE: In caso la lingua parlata sia l'italiano, sono disponibili servizi di assistenza linguistica gratuiti. Chiamare il numero [phone] (TTY: 711).  Tobie Lordseutsch (Micrones[place]German): ACHTUNG: Wenn Sie Deutsch sprechen, stehen Ihnen kostenlos sprachliche Hilfsdienstleistungen zur [employer]. Rufnummer: [phone] (TTY: 711).   (Arabic): [phone]   .            : .)711 :   (  ??? (Japanese): ??????????????????????????????????[phone] ?TTY?711?????????????????  ? (Farsi): [phone]  . ?   ? ?  ? ? ?~ ?  ?    : .??  (TTY: 711)  Din Bizaad (Navajo): D77 baa ak0 n7n7zin: D77 saad bee y1n7[ti'go Jodell Ciproin Bizaad, saad bee 1k1'1n7da'1wo'd66', t'11 Donnetta Hailjiik'eh, 49 n1 h0l=, koj8' h0d77lnih [phone] (TTY: 711).

## 2015-07-26 ENCOUNTER — Encounter: Payer: Self-pay | Admitting: *Deleted

## 2015-08-03 ENCOUNTER — Other Ambulatory Visit: Payer: Self-pay | Admitting: *Deleted

## 2015-08-03 NOTE — Patient Outreach (Signed)
[facility] Care Management  08/03/2015  [name] 11-Oct-1945 [id number]   CSW was able to meet with patient today for the initial home visit.  CSW was immediately struck by the limited amount of space that patient has in her current one-bedroom apartment.  CSW noted that it was very difficult for patient to maneuver around, especially with her rolling walker.  Patient admitted to being devastated regarding a denial letter she recently received from the [place] [employer] reporting that they will not allow her to move into a two-bedroom apartment under Section 8 Housing.  CSW agreed to write a letter on patient's behalf, as well as complete a second application.  CSW will submit the letter to patient's Primary Care Physician, Dr. Blanch MediaElizabeth Butcher to request approval and signature.  CSW is aware that Lynnae JanuaryShana Grady, Licensed Clinical Social Worker with the [facility] has also written a letter on patient's behalf.  CSW's letter reads as follows:  """""""To Whom it May Concern, [name] is currently suffering from three major medical issues at present.  Incontinence requires her to have a bedside commode beside her bed.  Sleep Apnea requires [name] to raise the head of her bed while sleeping, as well as efficiently use her CPAP machine.  Sleeping with the head of her bed raised enables [name] to relieve symptoms of Acid Reflux and it prevents [name] from rolling off the bed and into the floor.  [name] suffers from Major Depression, having been placed on Social Security Disability in 2006/2007.  [name] currently uses a rocker to ease symptoms of Depression, in addition to using a sunlight.  [name] sleeps in a adjustable Temperpedic bed due to chronic hip and back pain, sleep apnea and acid reflux.  [name] admits to weighing 376 pounds, causing her to rely on a rolling walker for ambulation. There is simply  not enough space in her current one bedroom apartment to accommodate all of [name] medical supplies.  In addition, [name] current setup is a safety hazard with regards to cords and inability to move in and out of tight spaces with rolling walker.  [name] is currently at major risk for falls.  [name] requires the use of her rolling walker to safely assist her in and out of the bed.  [name] has been hospitalized twice for blood clots due to immobility, as a result of her Depression.  I can personally attest to all of the above information, after having visited with [name] in her home for my initial assessment.  [name] symptoms of Depression appear to be worsening, as she admits to eating, sleeping and spending the majority of her time in bed.""""""""""""  Patient agreed to contact CSW on Monday, June 5th after her phone interview with the [place] [employer] to report findings of her conversation.  CSW will then contact patient on Tuesday, June 20th to follow-up regarding patient's request for expanded housing space.  Danford BadJoanna Berish Bohman, BSW, MSW, LCSW  Licensed Restaurant manager, fast foodClinical Social Worker  [facility] [place] [facility]  Mailing [address], [place], [place] [address] Physical Address-[address], [place], [place] [address] Toll Free Main # [phone] Fax # [phone] Cell # [phone]  Fax # [phone]  [email or web address]

## 2015-08-04 ENCOUNTER — Emergency Department (HOSPITAL_COMMUNITY): Payer: Commercial Managed Care - HMO

## 2015-08-04 ENCOUNTER — Inpatient Hospital Stay (HOSPITAL_COMMUNITY)
Admission: EM | Admit: 2015-08-04 | Discharge: 2015-08-11 | DRG: 853 | Disposition: A | Discharge status: Home / Self Care | Payer: Commercial Managed Care - HMO | Attending: Oncology | Admitting: Oncology

## 2015-08-04 ENCOUNTER — Encounter (HOSPITAL_COMMUNITY): Payer: Self-pay | Admitting: Radiology

## 2015-08-04 DIAGNOSIS — K8010 Calculus of gallbladder with chronic cholecystitis without obstruction: Secondary | ICD-10-CM

## 2015-08-04 DIAGNOSIS — K810 Acute cholecystitis: Secondary | ICD-10-CM | POA: Diagnosis not present

## 2015-08-04 DIAGNOSIS — I517 Cardiomegaly: Secondary | ICD-10-CM | POA: Diagnosis not present

## 2015-08-04 DIAGNOSIS — N3941 Urge incontinence: Secondary | ICD-10-CM | POA: Diagnosis present

## 2015-08-04 DIAGNOSIS — I129 Hypertensive chronic kidney disease with stage 1 through stage 4 chronic kidney disease, or unspecified chronic kidney disease: Secondary | ICD-10-CM | POA: Diagnosis present

## 2015-08-04 DIAGNOSIS — N6019 Diffuse cystic mastopathy of unspecified breast: Secondary | ICD-10-CM | POA: Diagnosis present

## 2015-08-04 DIAGNOSIS — Z8679 Personal history of other diseases of the circulatory system: Secondary | ICD-10-CM | POA: Diagnosis present

## 2015-08-04 DIAGNOSIS — B1910 Unspecified viral hepatitis B without hepatic coma: Secondary | ICD-10-CM | POA: Diagnosis not present

## 2015-08-04 DIAGNOSIS — N3946 Mixed incontinence: Secondary | ICD-10-CM | POA: Diagnosis present

## 2015-08-04 DIAGNOSIS — E8770 Fluid overload, unspecified: Secondary | ICD-10-CM | POA: Diagnosis not present

## 2015-08-04 DIAGNOSIS — K5730 Diverticulosis of large intestine without perforation or abscess without bleeding: Secondary | ICD-10-CM | POA: Diagnosis present

## 2015-08-04 DIAGNOSIS — R0600 Dyspnea, unspecified: Secondary | ICD-10-CM | POA: Diagnosis not present

## 2015-08-04 DIAGNOSIS — K830 Cholangitis: Secondary | ICD-10-CM | POA: Diagnosis not present

## 2015-08-04 DIAGNOSIS — N189 Chronic kidney disease, unspecified: Secondary | ICD-10-CM | POA: Diagnosis not present

## 2015-08-04 DIAGNOSIS — M179 Osteoarthritis of knee, unspecified: Secondary | ICD-10-CM | POA: Diagnosis present

## 2015-08-04 DIAGNOSIS — I272 Other secondary pulmonary hypertension: Secondary | ICD-10-CM | POA: Diagnosis present

## 2015-08-04 DIAGNOSIS — F322 Major depressive disorder, single episode, severe without psychotic features: Secondary | ICD-10-CM | POA: Diagnosis present

## 2015-08-04 DIAGNOSIS — R1110 Vomiting, unspecified: Secondary | ICD-10-CM | POA: Diagnosis not present

## 2015-08-04 DIAGNOSIS — E860 Dehydration: Secondary | ICD-10-CM | POA: Diagnosis present

## 2015-08-04 DIAGNOSIS — R7989 Other specified abnormal findings of blood chemistry: Secondary | ICD-10-CM | POA: Diagnosis not present

## 2015-08-04 DIAGNOSIS — R945 Abnormal results of liver function studies: Secondary | ICD-10-CM

## 2015-08-04 DIAGNOSIS — N3281 Overactive bladder: Secondary | ICD-10-CM | POA: Diagnosis present

## 2015-08-04 DIAGNOSIS — Z6841 Body Mass Index (BMI) 40.0 and over, adult: Secondary | ICD-10-CM | POA: Diagnosis present

## 2015-08-04 DIAGNOSIS — R0902 Hypoxemia: Secondary | ICD-10-CM | POA: Diagnosis present

## 2015-08-04 DIAGNOSIS — K819 Cholecystitis, unspecified: Secondary | ICD-10-CM | POA: Diagnosis not present

## 2015-08-04 DIAGNOSIS — I2782 Chronic pulmonary embolism: Secondary | ICD-10-CM | POA: Diagnosis not present

## 2015-08-04 DIAGNOSIS — I2602 Saddle embolus of pulmonary artery with acute cor pulmonale: Secondary | ICD-10-CM | POA: Diagnosis not present

## 2015-08-04 DIAGNOSIS — Z8249 Family history of ischemic heart disease and other diseases of the circulatory system: Secondary | ICD-10-CM

## 2015-08-04 DIAGNOSIS — K8066 Calculus of gallbladder and bile duct with acute and chronic cholecystitis without obstruction: Secondary | ICD-10-CM | POA: Diagnosis present

## 2015-08-04 DIAGNOSIS — Z7901 Long term (current) use of anticoagulants: Secondary | ICD-10-CM | POA: Diagnosis not present

## 2015-08-04 DIAGNOSIS — M1710 Unilateral primary osteoarthritis, unspecified knee: Secondary | ICD-10-CM | POA: Diagnosis present

## 2015-08-04 DIAGNOSIS — I10 Essential (primary) hypertension: Secondary | ICD-10-CM | POA: Diagnosis present

## 2015-08-04 DIAGNOSIS — Z9119 Patient's noncompliance with other medical treatment and regimen: Secondary | ICD-10-CM | POA: Diagnosis not present

## 2015-08-04 DIAGNOSIS — R935 Abnormal findings on diagnostic imaging of other abdominal regions, including retroperitoneum: Secondary | ICD-10-CM | POA: Diagnosis not present

## 2015-08-04 DIAGNOSIS — Z79899 Other long term (current) drug therapy: Secondary | ICD-10-CM

## 2015-08-04 DIAGNOSIS — R109 Unspecified abdominal pain: Secondary | ICD-10-CM | POA: Diagnosis not present

## 2015-08-04 DIAGNOSIS — J9601 Acute respiratory failure with hypoxia: Secondary | ICD-10-CM | POA: Diagnosis not present

## 2015-08-04 DIAGNOSIS — K219 Gastro-esophageal reflux disease without esophagitis: Secondary | ICD-10-CM | POA: Diagnosis present

## 2015-08-04 DIAGNOSIS — R0602 Shortness of breath: Secondary | ICD-10-CM | POA: Diagnosis not present

## 2015-08-04 DIAGNOSIS — R8271 Bacteriuria: Secondary | ICD-10-CM | POA: Diagnosis present

## 2015-08-04 DIAGNOSIS — K828 Other specified diseases of gallbladder: Secondary | ICD-10-CM | POA: Diagnosis not present

## 2015-08-04 DIAGNOSIS — E785 Hyperlipidemia, unspecified: Secondary | ICD-10-CM | POA: Diagnosis present

## 2015-08-04 DIAGNOSIS — E662 Morbid (severe) obesity with alveolar hypoventilation: Secondary | ICD-10-CM | POA: Diagnosis not present

## 2015-08-04 DIAGNOSIS — J9611 Chronic respiratory failure with hypoxia: Secondary | ICD-10-CM | POA: Diagnosis present

## 2015-08-04 DIAGNOSIS — B1920 Unspecified viral hepatitis C without hepatic coma: Secondary | ICD-10-CM | POA: Diagnosis not present

## 2015-08-04 DIAGNOSIS — Z86718 Personal history of other venous thrombosis and embolism: Secondary | ICD-10-CM

## 2015-08-04 DIAGNOSIS — N182 Chronic kidney disease, stage 2 (mild): Secondary | ICD-10-CM | POA: Diagnosis present

## 2015-08-04 DIAGNOSIS — R1084 Generalized abdominal pain: Secondary | ICD-10-CM | POA: Diagnosis not present

## 2015-08-04 DIAGNOSIS — D6861 Antiphospholipid syndrome: Secondary | ICD-10-CM | POA: Diagnosis not present

## 2015-08-04 DIAGNOSIS — M1990 Unspecified osteoarthritis, unspecified site: Secondary | ICD-10-CM | POA: Diagnosis not present

## 2015-08-04 DIAGNOSIS — K8000 Calculus of gallbladder with acute cholecystitis without obstruction: Secondary | ICD-10-CM | POA: Diagnosis not present

## 2015-08-04 DIAGNOSIS — E876 Hypokalemia: Secondary | ICD-10-CM | POA: Diagnosis present

## 2015-08-04 DIAGNOSIS — T81509A Unspecified complication of foreign body accidentally left in body following unspecified procedure, initial encounter: Secondary | ICD-10-CM

## 2015-08-04 DIAGNOSIS — R0609 Other forms of dyspnea: Secondary | ICD-10-CM | POA: Diagnosis not present

## 2015-08-04 DIAGNOSIS — K2970 Gastritis, unspecified, without bleeding: Secondary | ICD-10-CM | POA: Diagnosis not present

## 2015-08-04 DIAGNOSIS — K8020 Calculus of gallbladder without cholecystitis without obstruction: Secondary | ICD-10-CM

## 2015-08-04 DIAGNOSIS — F329 Major depressive disorder, single episode, unspecified: Secondary | ICD-10-CM | POA: Diagnosis present

## 2015-08-04 DIAGNOSIS — N183 Chronic kidney disease, stage 3 unspecified: Secondary | ICD-10-CM | POA: Diagnosis present

## 2015-08-04 DIAGNOSIS — G4733 Obstructive sleep apnea (adult) (pediatric): Secondary | ICD-10-CM | POA: Diagnosis present

## 2015-08-04 DIAGNOSIS — Z9049 Acquired absence of other specified parts of digestive tract: Secondary | ICD-10-CM | POA: Diagnosis not present

## 2015-08-04 DIAGNOSIS — R1013 Epigastric pain: Secondary | ICD-10-CM | POA: Diagnosis present

## 2015-08-04 DIAGNOSIS — Z87891 Personal history of nicotine dependence: Secondary | ICD-10-CM

## 2015-08-04 DIAGNOSIS — R911 Solitary pulmonary nodule: Secondary | ICD-10-CM | POA: Diagnosis present

## 2015-08-04 DIAGNOSIS — A419 Sepsis, unspecified organism: Principal | ICD-10-CM | POA: Diagnosis present

## 2015-08-04 DIAGNOSIS — R740 Nonspecific elevation of levels of transaminase and lactic acid dehydrogenase [LDH]: Secondary | ICD-10-CM | POA: Diagnosis not present

## 2015-08-04 DIAGNOSIS — E8779 Other fluid overload: Secondary | ICD-10-CM | POA: Diagnosis not present

## 2015-08-04 DIAGNOSIS — J810 Acute pulmonary edema: Secondary | ICD-10-CM | POA: Diagnosis not present

## 2015-08-04 DIAGNOSIS — R10811 Right upper quadrant abdominal tenderness: Secondary | ICD-10-CM | POA: Diagnosis not present

## 2015-08-04 DIAGNOSIS — R112 Nausea with vomiting, unspecified: Secondary | ICD-10-CM | POA: Diagnosis not present

## 2015-08-04 DIAGNOSIS — Z86711 Personal history of pulmonary embolism: Secondary | ICD-10-CM

## 2015-08-04 LAB — URINALYSIS, ROUTINE W REFLEX MICROSCOPIC
Glucose, UA: NEGATIVE mg/dL
Hgb urine dipstick: NEGATIVE
Ketones, ur: NEGATIVE mg/dL
LEUKOCYTES UA: NEGATIVE
NITRITE: POSITIVE — AB
PROTEIN: 30 mg/dL — AB
SPECIFIC GRAVITY, URINE: 1.042 — AB (ref 1.005–1.030)
pH: 6 (ref 5.0–8.0)

## 2015-08-04 LAB — COMPREHENSIVE METABOLIC PANEL WITH GFR
ALT: 184 U/L — ABNORMAL HIGH (ref 14–54)
AST: 360 U/L — ABNORMAL HIGH (ref 15–41)
Albumin: 4.4 g/dL (ref 3.5–5.0)
Alkaline Phosphatase: 124 U/L (ref 38–126)
Anion gap: 11 (ref 5–15)
BUN: 13 mg/dL (ref 6–20)
CO2: 28 mmol/L (ref 22–32)
Calcium: 9.2 mg/dL (ref 8.9–10.3)
Chloride: 96 mmol/L — ABNORMAL LOW (ref 101–111)
Creatinine, Ser: 1.15 mg/dL — ABNORMAL HIGH (ref 0.44–1.00)
GFR calc Af Amer: 55 mL/min — ABNORMAL LOW
GFR calc non Af Amer: 48 mL/min — ABNORMAL LOW
Glucose, Bld: 204 mg/dL — ABNORMAL HIGH (ref 65–99)
Potassium: 3.5 mmol/L (ref 3.5–5.1)
Sodium: 135 mmol/L (ref 135–145)
Total Bilirubin: 2.3 mg/dL — ABNORMAL HIGH (ref 0.3–1.2)
Total Protein: 8.8 g/dL — ABNORMAL HIGH (ref 6.5–8.1)

## 2015-08-04 LAB — CBC WITH DIFFERENTIAL/PLATELET
BASOS PCT: 0 %
Basophils Absolute: 0 10*3/uL (ref 0.0–0.1)
EOS ABS: 0 10*3/uL (ref 0.0–0.7)
Eosinophils Relative: 0 %
HCT: 48.5 % — ABNORMAL HIGH (ref 36.0–46.0)
HEMOGLOBIN: 16 g/dL — AB (ref 12.0–15.0)
Lymphocytes Relative: 4 %
Lymphs Abs: 0.6 10*3/uL — ABNORMAL LOW (ref 0.7–4.0)
MCH: 26.9 pg (ref 26.0–34.0)
MCHC: 33 g/dL (ref 30.0–36.0)
MCV: 81.6 fL (ref 78.0–100.0)
Monocytes Absolute: 0.1 10*3/uL (ref 0.1–1.0)
Monocytes Relative: 1 %
NEUTROS ABS: 12.4 10*3/uL — AB (ref 1.7–7.7)
NEUTROS PCT: 95 %
Platelets: 206 10*3/uL (ref 150–400)
RBC: 5.94 MIL/uL — AB (ref 3.87–5.11)
RDW: 14.5 % (ref 11.5–15.5)
WBC: 13.1 10*3/uL — AB (ref 4.0–10.5)

## 2015-08-04 LAB — URINE MICROSCOPIC-ADD ON
RBC / HPF: NONE SEEN RBC/hpf (ref 0–5)
SQUAMOUS EPITHELIAL / LPF: NONE SEEN

## 2015-08-04 LAB — LIPASE, BLOOD: LIPASE: 24 U/L (ref 11–51)

## 2015-08-04 LAB — I-STAT CG4 LACTIC ACID, ED
LACTIC ACID, VENOUS: 2.55 mmol/L — AB (ref 0.5–2.0)
LACTIC ACID, VENOUS: 4.39 mmol/L — AB (ref 0.5–2.0)

## 2015-08-04 LAB — TROPONIN I

## 2015-08-04 MED ORDER — VANCOMYCIN HCL 10 G IV SOLR: 1750.0000 mg | INTRAVENOUS | Status: DC

## 2015-08-04 MED ORDER — SODIUM CHLORIDE 0.9 % IV BOLUS (SEPSIS)
1000.0000 mL | Freq: Once | INTRAVENOUS | Status: AC
  Administered 2015-08-04: 1000 mL via INTRAVENOUS

## 2015-08-04 MED ORDER — PIPERACILLIN-TAZOBACTAM 3.375 G IVPB 30 MIN
3.3750 g | Freq: Once | INTRAVENOUS | Status: AC
  Administered 2015-08-04: 3.375 g via INTRAVENOUS
  Filled 2015-08-04: qty 50

## 2015-08-04 MED ORDER — VANCOMYCIN HCL 10 G IV SOLR
2500.0000 mg | INTRAVENOUS | Status: AC
  Administered 2015-08-04: 2500 mg via INTRAVENOUS
  Filled 2015-08-04: qty 500

## 2015-08-04 MED ORDER — VANCOMYCIN HCL IN DEXTROSE 1-5 GM/200ML-% IV SOLN: 1000.0000 mg | Freq: Once | INTRAVENOUS | Status: DC

## 2015-08-04 MED ORDER — PIPERACILLIN-TAZOBACTAM 3.375 G IVPB
3.3750 g | Freq: Three times a day (TID) | INTRAVENOUS | Status: DC
  Administered 2015-08-05 – 2015-08-10 (×17): 3.375 g via INTRAVENOUS
  Filled 2015-08-04 (×19): qty 50

## 2015-08-04 MED ORDER — SODIUM CHLORIDE 0.9 % IV SOLN
INTRAVENOUS | Status: DC
  Administered 2015-08-04 – 2015-08-05 (×4): via INTRAVENOUS

## 2015-08-04 MED ORDER — IOPAMIDOL (ISOVUE-300) INJECTION 61%
100.0000 mL | Freq: Once | INTRAVENOUS | Status: AC | PRN
  Administered 2015-08-04: 100 mL via INTRAVENOUS

## 2015-08-04 MED ORDER — LORAZEPAM 2 MG/ML IJ SOLN
1.0000 mg | Freq: Once | INTRAMUSCULAR | Status: AC
  Administered 2015-08-04: 1 mg via INTRAVENOUS
  Filled 2015-08-04: qty 1

## 2015-08-04 MED ORDER — DIATRIZOATE MEGLUMINE & SODIUM 66-10 % PO SOLN
15.0000 mL | Freq: Once | ORAL | Status: AC
  Administered 2015-08-04: 30 mL via ORAL

## 2015-08-04 MED ORDER — SODIUM CHLORIDE 0.9 % IV BOLUS (SEPSIS)
500.0000 mL | Freq: Once | INTRAVENOUS | Status: AC
  Administered 2015-08-04: 500 mL via INTRAVENOUS

## 2015-08-04 MED ORDER — ACETAMINOPHEN 650 MG RE SUPP
650.0000 mg | Freq: Once | RECTAL | Status: AC
  Administered 2015-08-04: 650 mg via RECTAL
  Filled 2015-08-04: qty 1

## 2015-08-04 MED ORDER — SODIUM CHLORIDE 0.9 % IV BOLUS (SEPSIS): 1000.0000 mL | Freq: Once | INTRAVENOUS | Status: DC

## 2015-08-04 NOTE — H&P (Signed)
Date: 08/04/2015               Patient Name:  [name] MRN: [id number]  DOB: 1945/09/10 Age / Sex: 70 y.o., female   PCP: [name], MD         Medical Service: Internal Medicine Teaching Service         Attending Physician: Dr. Levert Feinstein, MD    First Contact: Dr. Ladona Ridgel Pager: [phone]  Second Contact: Dr. Allena Katz Pager: [phone]       After Hours (After 5p/  First Contact Pager: [phone]  weekends / holidays): Second Contact Pager: [phone]   Chief Complaint: abdominal pain  History of Present Illness: Patient is a 70 yo F with a PMHx of diverticulosis, Hep B, PE, HTN, OSA, HLD, CKD presenting to the hospital complaining of R sided abdominal pain. States the pain started at 4 am in the morning and  woke up her up from her sleep because it was "paralyzing." States she vomited the ice-cream she had for dinner and called EMS. Denies any prior history of abdominal pain or abdominal surgeries. Denies having any fevers or chills. Denies having any CP or SOB. Denies having any hematemesis or hematochezia. Reports having a history of urinary incontinence but is not complaining of any dysuria or flank pain.  Patient was transferred [facility] from [facility] ED. She was given IVF there as her lactic acid was initially elevated. Also given IV antibiotics as her abdominal CT was concerning for cholecystitis and a subsequent ultrasound confirmed the diagnosis. Repeat lactic acid was more elevated. [facility] ED physician spoke to general surgery and patient was transferred to [facility]. Upon admission here, patient was found to be febrile (T 103.76F), tachycardic (HR 100s-120s), and tachypnic (RR 20s-30s).   Meds: Current Facility-Administered Medications  Medication Dose Route Frequency Provider Last Rate Last Dose  . 0.9 %  sodium chloride infusion   Intravenous Continuous Lorre Nick, MD   Stopped at 08/04/15 2020  . [START ON 08/05/2015] piperacillin-tazobactam (ZOSYN) IVPB 3.375 g  3.375 g  Intravenous Q8H Lorre Nick, MD      . sodium chloride 0.9 % bolus 1,000 mL  1,000 mL Intravenous Once Lorre Nick, MD        Allergies: Allergies as of 08/04/2015 - Review Complete 08/04/2015  Allergen Reaction Noted  . Lisinopril  09/24/2006   Past Medical History  Diagnosis Date  . Depression     followed by Dr. Evelene Croon  . Diverticulosis of colon   . History of hepatitis B   . Hypertension   . Arthritis     osteoarthritis , kness.  [name] History of pulmonary embolism 01/2002  . Obesity   . Fibrocystic breast disease   . Obstructive sleep apnea     refuses CPAP.  [name] Hyperlipidemia   . Chronic kidney disease     Renal insuffucinecy, cr-1.33 in 10/2005.  [name] Left shoulder pain     2/2 fall  . Neck nodule     not erythematous, movable like cyst located at the base of posterior neck on the left side(not painful), will need follow up for   . Pulmonary nodule     Stable on repeat imaging. LLL nodule 5 mm   Past Surgical History  Procedure Laterality Date  . Gyn surgery      s/p excison of vulvar cyst 10/18/04 by dr. Ilene QuaChristell Constant   Family History  Problem Relation Age of Onset  . Hypertension  family history  . Kidney disease Maternal Uncle    Social History   Social History  . Marital Status: Divorced    Spouse Name: N/A  . Number of Children: N/A  . Years of Education: N/A   Occupational History  . Not on file.   Social History Main Topics  . Smoking status: Former Smoker -- 1.00 packs/day for 20 years    Types: Cigarettes    Quit date: 03/03/1990  . Smokeless tobacco: Not on file  . Alcohol Use: No  . Drug Use: No  . Sexual Activity: Not on file   Other Topics Concern  . Not on file   Social History Narrative   Grew up in government housing. Received college education, had career Education officer, community, Veterinary surgeon, others), and own home before depression dx. Now back in government housing. Has dog with whom she is close.    Review of Systems: Review of  Systems  Constitutional: Negative for fever and chills.  HENT: Negative for congestion.   Eyes: Negative for blurred vision and pain.  Respiratory: Negative for cough, shortness of breath and wheezing.   Cardiovascular: Negative for chest pain and leg swelling.  Gastrointestinal: Positive for nausea, vomiting and abdominal pain. Negative for diarrhea and blood in stool.  Genitourinary: Negative for dysuria and flank pain.  Musculoskeletal: Negative for myalgias.  Skin: Negative for itching and rash.  Neurological: Negative for sensory change, focal weakness and headaches.    Physical Exam: Blood pressure 146/70, pulse 111, temperature 99.9 F (37.7 C), temperature source Oral, resp. rate 26, height 5' 7.5" (1.715 m), weight 169.192 kg (373 lb), SpO2 95 %. Physical Exam  Constitutional: She is oriented to person, place, and time. She appears well-developed and well-nourished.  Morbidly obese female lying comfortably in hospital bed   HENT:  Head: Normocephalic and atraumatic.  Mouth/Throat: Oropharynx is clear and moist.  Eyes: EOM are normal.  Neck: Neck supple. No tracheal deviation present.  Cardiovascular: Normal rate, regular rhythm and intact distal pulses.   Pulmonary/Chest: Effort normal and breath sounds normal. She has no wheezes. She has no rales.  Abdominal: Soft. Bowel sounds are normal. She exhibits no distension. There is tenderness. There is no rebound and no guarding.  Mild RUQ abdominal tenderness on palpation Murphy's sign negative  Boa sign negative   Musculoskeletal: She exhibits no edema.  Neurological: She is alert and oriented to person, place, and time.  Skin: Skin is warm and dry. No rash noted. No erythema.    Lab results: Basic Metabolic Panel:  Recent Labs  82/95/62 1441  NA 135  K 3.5  CL 96*  CO2 28  GLUCOSE 204*  BUN 13  CREATININE 1.15*  CALCIUM 9.2   Liver Function Tests:  Recent Labs  08/04/15 1441  AST 360*  ALT 184*  ALKPHOS  124  BILITOT 2.3*  PROT 8.8*  ALBUMIN 4.4    Recent Labs  08/04/15 1441  LIPASE 24   CBC:  Recent Labs  08/04/15 1441  WBC 13.1*  NEUTROABS 12.4*  HGB 16.0*  HCT 48.5*  MCV 81.6  PLT 206   Cardiac Enzymes:  Recent Labs  08/04/15 1452  TROPONINI <0.03   Urinalysis:  Recent Labs  08/04/15 1700  COLORURINE AMBER*  LABSPEC 1.042*  PHURINE 6.0  GLUCOSEU NEGATIVE  HGBUR NEGATIVE  BILIRUBINUR SMALL*  KETONESUR NEGATIVE  PROTEINUR 30*  NITRITE POSITIVE*  LEUKOCYTESUR NEGATIVE      Imaging results:  US Abdomen Complete  08/04/2015  CLINICAL DATA:  70 year old female with diffuse abdominal pain for 1 day. EXAM: ABDOMEN ULTRASOUND COMPLETE COMPARISON:  08/04/2015 CT FINDINGS: Gallbladder: A 1.6 cm nonmobile gallstone is identified at the neck. Mild gallbladder wall thickening is noted. There is no evidence of pericholecystic fluid or definite sonographic Murphy's sign. Common bile duct: Diameter: 6 mm. No definite intrahepatic biliary dilatation. Liver: Heterogeneous echotexture. The liver is difficult to visualize secondary to body habitus. IVC: Not well visualized Pancreas: Not well visualized Spleen: Size and appearance within normal limits. Right Kidney: Length: 10.1 cm. Echogenicity within normal limits. No mass or hydronephrosis visualized. Left Kidney: Length: 17.1 cm. A 9 cm upper pole cyst is again identified. Echogenicity within normal limits. No mass or hydronephrosis visualized. Abdominal aorta: Not well visualized Other findings: No free fluid IMPRESSION: 1.6 cm nonmobile gallstone at the neck with mild gallbladder wall thickening -equivocal for acute cholecystitis. Liver, IVC, pancreas and abdominal aorta not well visualized secondary to body habitus. Electronically Signed   By: Harmon PierJeffrey  Hu M.D.   On: 08/04/2015 18:33   Ct Abdomen Pelvis W Contrast  08/04/2015  CLINICAL DATA:  Acute onset vomiting and generalized abdominal pain earlier today. Hepatitis B.  Diverticulosis. EXAM: CT ABDOMEN AND PELVIS WITH CONTRAST TECHNIQUE: Multidetector CT imaging of the abdomen and pelvis was performed using the standard protocol following bolus administration of intravenous contrast. CONTRAST:  100mL ISOVUE-300 IOPAMIDOL (ISOVUE-300) INJECTION 61% COMPARISON:  None. FINDINGS: Lower chest: Dependent atelectasis noted in both lung bases. Small hiatal hernia also seen. Hepatobiliary: No liver masses identified. Gallbladder is distended, however there is no evidence of gallbladder wall thickening or pericholecystic inflammatory changes. There is a possible small stone in the dependent portion the gallbladder. No evidence of biliary ductal dilatation. Pancreas: No mass, inflammatory changes, or other significant abnormality. Spleen: Within normal limits in size and appearance. Adrenals/Urinary Tract: No masses identified. No evidence of hydronephrosis. Large simple left renal cyst seen measuring 9 cm. Stomach/Bowel: No evidence of obstruction, inflammatory process, or abnormal fluid collections. Diverticulosis is seen involving the descending sigmoid colon, however there is no evidence of diverticulitis. Normal appendix visualized. Vascular/Lymphatic: No pathologically enlarged lymph nodes. No evidence of abdominal aortic aneurysm. Reproductive: No mass or other significant abnormality. Other: None. Musculoskeletal:  No suspicious bone lesions identified. IMPRESSION: Distended gallbladder, with possible small gallstone. No radiographic evidence of acute cholecystitis or biliary dilatation. Recommend clinical correlation, and consider abdominal ultrasound for further evaluation if warranted. Colonic diverticulosis. No radiographic evidence of diverticulitis. Small hiatal hernia. Dependent atelectasis in both lung bases. Electronically Signed   By: Myles RosenthalJohn  Stahl M.D.   On: 08/04/2015 16:59   Dg Abd Acute W/chest  08/04/2015  CLINICAL DATA:  Shortness of breath with abdominal pain and  vomiting EXAM: DG ABDOMEN ACUTE W/ 1V CHEST COMPARISON:  Chest radiograph October 26, 2013 FINDINGS: PA chest: There is cardiomegaly with pulmonary venous hypertension. No edema or consolidation. No adenopathy. Supine and left lateral decubitus abdomen images: There is fairly diffuse stool throughout the colon. There is no appreciable bowel dilatation or air-fluid level. No free air. No abnormal calcifications. IMPRESSION: Evidence of pulmonary vascular congestion without frank edema or consolidation. There is fairly diffuse stool throughout colon. No bowel obstruction or free air evident. Electronically Signed   By: Bretta BangWilliam  Woodruff III M.D.   On: 08/04/2015 15:39    Other results: EKG: Sinus tachycardia. T wave inversion on AVL which is similar to prior tracing from 10/2013.   Assessment & Plan by Problem: Active Problems:  Cholecystitis  Cholecystitis Patient is presenting with acute onset R sided abdominal pain and non-bloody emesis. CT of abdomen suggestive of cholecystitis and ultrasound confirmed the diagnosis. She has a mild leukocytosis with WBC 131.1 Her pain is not likely due to pancreatitis as lipase is normal. Alkaline phosphatase is normal but patient does have transaminitis (AST 360 and ALT 184) which is likely related to her GB pathology.   -Admit to stepdown -ED physician from [facility] spoke to general surgery. Appreciate recs.  -NS@ 125 cc/hr -Patient was started on vanc and zosyn in the ED. Discontinue Vanc and continue Zosyn.  -Blood cx x 2 pending  -Lactic acid pending  -CBC in a.m. -CMP in a.m.  Dehydration SCr mildly elevated at 1.15 on admission, BL 0.9-1.  likely in the setting of vomiting and poor PO intake.  -CMP in a.m.  Asymptomatic bacteriuria UA showing many bacteria and positive nitrites. However, patient is symptomatic.  -Hold giving any antibiotics at this time  -Urine culture pending   History of PE -Hold Xarelto as patient will be going for surgery    -Start Heparin at therapeutic dose   HTN -On home Amlodipine 5 mg daily. Holding PO meds for now. BP stable.   HLD -On home Pravastatin 40 mg daily. Holding PO meds for now.  Depression -On home med Wellbutrin 100 mg TID. Holding PO meds for now.   Overactive bladder -On home med Mirabegron. Holding oral meds for now.   DVT ppx: Heparin  Diet: NPO  Code: Full (confimed with patient)  Dispo: Disposition is deferred at this time, awaiting improvement of current medical problems. Anticipated discharge in approximately 2-3 day(s).   The patient does have a current PCP [name], MD) and does need an [facility] hospital follow-up appointment after discharge.  The patient does not have transportation limitations that hinder transportation to clinic appointments.  Signed: John Giovanni, MD 08/04/2015, 11:42 PM

## 2015-08-04 NOTE — ED Notes (Signed)
Made 1 attempt in left hand with out success

## 2015-08-04 NOTE — ED Notes (Signed)
Pt from home, lives alone with her dog. Per report, started vomiting around 0430 today and then developed abdominal pain afterwards.  Pt uses a walker and a cane and is on xarelto d/t "not moving around enough."  EMS IV to LAC #22: zofran 4mg  & fentanyl 100mcg PTA.  VS: 160/97,SR 95, 20, 99% RA but she requested to be on O2, CBG 168.

## 2015-08-04 NOTE — ED Notes (Signed)
Attempted report x1 to Becky, Charity fuBurr OakndraiserN.  She was receiving report and will call back.

## 2015-08-04 NOTE — Progress Notes (Signed)
Spoke with md about needing to have Lactic acid redrawn she said she woul see pt first and then place orders

## 2015-08-04 NOTE — ED Provider Notes (Signed)
CSN: [id number]     Arrival date & time 08/04/15  1409 History   First MD Initiated Contact with Patient 08/04/15 1421     Chief Complaint  Patient presents with  . Abdominal Pain  . Emesis     (Consider location/radiation/quality/duration/timing/severity/associated sxs/prior Treatment) HPI Comments: Patient here with acute onset of epigastric abdominal pain that woke her from sleep which is similar to her prior history of peptic ulcer disease. She had emesis 2 which was nonbilious or bloody. Pain did not radiate to her back. No associated anginal symptoms. Denies any recent black stools. Pain characterized as burning. States that she ate cookie no last night. Patient is more really obese and weighs 375 pounds. EMS was called and patient given IV fluids, Zofran, fentanyl and does feel much better at this time.  Patient is a 70 y.o. female presenting with abdominal pain and vomiting. The history is provided by the patient and the EMS personnel.  Abdominal Pain Associated symptoms: vomiting   Emesis Associated symptoms: abdominal pain     Past Medical History  Diagnosis Date  . Depression     followed by Dr. Evelene Croon  . Diverticulosis of colon   . History of hepatitis B   . Hypertension   . Arthritis     osteoarthritis , kness.  [name] History of pulmonary embolism 01/2002  . Obesity   . Fibrocystic breast disease   . Obstructive sleep apnea     refuses CPAP.  [name] Hyperlipidemia   . Chronic kidney disease     Renal insuffucinecy, cr-1.33 in 10/2005.  [name] Left shoulder pain     2/2 fall  . Neck nodule     not erythematous, movable like cyst located at the base of posterior neck on the left side(not painful), will need follow up for   . Pulmonary nodule     Stable on repeat imaging. LLL nodule 5 mm   Past Surgical History  Procedure Laterality Date  . Gyn surgery      s/p excison of vulvar cyst 10/18/04 by dr. Ilene QuaChristell Constant   Family History  Problem Relation Age of Onset  .  Hypertension      family history  . Kidney disease Maternal Uncle    Social History  Substance Use Topics  . Smoking status: Former Smoker -- 1.00 packs/day for 20 years    Types: Cigarettes    Quit date: 03/03/1990  . Smokeless tobacco: Not on file  . Alcohol Use: No   OB History    No data available     Review of Systems  Gastrointestinal: Positive for vomiting and abdominal pain.  All other systems reviewed and are negative.     Allergies  Lisinopril  Home Medications   Prior to Admission medications   Medication Sig Start Date End Date Taking? Authorizing Provider  ALPRAZolam [name]) 1 MG tablet Take 1 mg by mouth at bedtime as needed for sleep. Take 1.5 tab by mouth everynight.    Historical Provider, MD  amLODipine (NORVASC) 10 MG tablet Take 0.5 tablets (5 mg total) by mouth daily. 07/12/15   [name], MD  buPROPion (WELLBUTRIN) 100 MG tablet Take 100 mg by mouth 3 (three) times daily.    Historical Provider, MD  hydrochlorothiazide (HYDRODIURIL) 25 MG tablet Take 1 tablet (25 mg total) by mouth daily as needed (edema). 07/12/15   [name], MD  mirabegron ER (MYRBETRIQ) 50 MG TB24 tablet Take 1 tablet (50 mg total)  by mouth daily. 07/12/15   Burns SpainElizabeth A Butcher, MD  Multiple Vitamin (MULTIVITAMIN) tablet Take 1 tablet by mouth daily. 07/11/13   Marrian SalvageJacquelyn S Gill, MD  penicillin v potassium (VEETID) 500 MG tablet Take 1 tablet (500 mg total) by mouth 4 (four) times daily. 03/12/15   Roxy Horsemanobert Browning, PA-C  potassium chloride (K-DUR) 10 MEQ tablet Take 1 tablet (10 mEq total) by mouth 2 (two) times daily. 07/27/14   Burns SpainElizabeth A Butcher, MD  pravastatin (PRAVACHOL) 40 MG tablet TAKE 1 TABLET EVERY DAY 07/19/14   Burns SpainElizabeth A Butcher, MD  XARELTO 20 MG TABS tablet TAKE 1 TABLET EVERY DAY WITH SUPPER 12/11/14   Burns SpainElizabeth A Butcher, MD   BP 169/101 mmHg  Pulse 114  Temp(Src) 98.9 F (37.2 C) (Oral)  Resp 28  Ht 5\' 7"  (1.702 m)  Wt 160.12 kg  BMI 55.27 kg/m2   SpO2 97% Physical Exam  Constitutional: She is oriented to person, place, and time. She appears well-developed and well-nourished.  Non-toxic appearance. No distress.  HENT:  Head: Normocephalic and atraumatic.  Eyes: Conjunctivae, EOM and lids are normal. Pupils are equal, round, and reactive to light.  Neck: Normal range of motion. Neck supple. No tracheal deviation present. No thyroid mass present.  Cardiovascular: Regular rhythm and normal heart sounds.  Tachycardia present.  Exam reveals no gallop.   No murmur heard. Pulmonary/Chest: Effort normal and breath sounds normal. No stridor. No respiratory distress. She has no decreased breath sounds. She has no wheezes. She has no rhonchi. She has no rales.  Abdominal: Soft. Normal appearance and bowel sounds are normal. She exhibits no distension. There is tenderness in the right upper quadrant and epigastric area. There is guarding. There is no rigidity, no rebound and no CVA tenderness.    Musculoskeletal: Normal range of motion. She exhibits no edema or tenderness.  Neurological: She is alert and oriented to person, place, and time. She has normal strength. No cranial nerve deficit or sensory deficit. GCS eye subscore is 4. GCS verbal subscore is 5. GCS motor subscore is 6.  Skin: Skin is warm and dry. No abrasion and no rash noted.  Psychiatric: She has a normal mood and affect. Her speech is normal and behavior is normal.  Nursing note and vitals reviewed.   ED Course  Procedures (including critical care time) Labs Review Labs Reviewed  URINE CULTURE  CBC WITH DIFFERENTIAL/PLATELET  COMPREHENSIVE METABOLIC PANEL  LIPASE, BLOOD  URINALYSIS, ROUTINE W REFLEX MICROSCOPIC (NOT AT [facility])    Imaging Review No results found. I have personally reviewed and evaluated these images and lab results as part of my medical decision-making.   EKG Interpretation   Date/Time:  Saturday August 04 2015 14:31:45 EDT Ventricular Rate:  115 PR  Interval:  184 QRS Duration: 77 QT Interval:  317 QTC Calculation: 438 R Axis:   -24 Text Interpretation:  Sinus tachycardia Borderline left axis deviation  Nonspecific T abnormalities, lateral leads Confirmed by Sony Schlarb  MD, Divit Stipp  ([id number]) on 08/04/2015 3:00:50 PM      MDM   Final diagnoses:  None    Patient given IV fluids here and had a lactic acid that was initially elevated. Given IV antibiotics and had abdominal CT which was concerning for cholecystitis. A subsequent ultrasound confirmed this. A repeat lactate was more elevated. Fluid resuscitation started. Discuss with Dr. Magnus IvanBlackman from general surgery who recommends a medical admission. Patient belongs to the internal medicine service at [facility]. I spoke with family  and accepted the patient in transfer to the stepdown unit. At time of transfer, patient is mentating appropriately. She has mild tachycardic. Blood pressure stable.   CRITICAL CARE Performed by: Toy Baker Total critical care time: 60 minutes Critical care time was exclusive of separately billable procedures and treating other patients. Critical care was necessary to treat or prevent imminent or life-threatening deterioration. Critical care was time spent personally by me on the following activities: development of treatment plan with patient and/or surrogate as well as nursing, discussions with consultants, evaluation of patient's response to treatment, examination of patient, obtaining history from patient or surrogate, ordering and performing treatments and interventions, ordering and review of laboratory studies, ordering and review of radiographic studies, pulse oximetry and re-evaluation of patient's condition.     Lorre Nick, MD 08/04/15 Ebony Cargo

## 2015-08-04 NOTE — ED Notes (Signed)
Report called to MartellOkiesha, Charity fundraiserN. On [facility] [id number]

## 2015-08-04 NOTE — Progress Notes (Addendum)
Pharmacy Antibiotic Note  [name] is a 70 y.o. female admitted on 08/04/2015 with sepsis.  Pharmacy has been consulted for Vancomycin and Zosyn dosing.  Plan:  Zosyn 3.375g IV x 1 over 30 minutes in the ED STAT, then continue with Zosyn 3.375g IV q8h (infuse over 4 hours).  Vancomycin 2500mg  IV x 1 STAT, continue with Vancomycin 1750mg  IV q24h.  Plan for Vancomycin trough level at steady state. Goal trough level 15-20 mcg/mL.  Monitor renal function, cultures, clinical course.   Height: 5' 7.5" (171.5 cm) Weight: (!) 373 lb (169.192 kg) IBW/kg (Calculated) : 62.75  Temp (24hrs), Avg:101.4 F (38.6 C), Min:98.9 F (37.2 C), Max:103.9 F (39.9 C)   Recent Labs Lab 08/04/15 1441 08/04/15 1500 08/04/15 1808  WBC 13.1*  --   --   CREATININE 1.15*  --   --   LATICACIDVEN  --  2.55* 4.39*    Estimated Creatinine Clearance: 77.9 mL/min (by C-G formula based on Cr of 1.15).    Allergies  Allergen Reactions  . Lisinopril     REACTION: PT with cough    Antimicrobials this admission: 6/3 >> Vancomycin >> 6/3 >> Zosyn >>  Dose adjustments this admission: ---  Microbiology results: 6/3 BCx: sent 6/3 UCx: sent   Thank you for allowing pharmacy to be a part of this patient's care.  Greer PickerelJigna Mikenzie Mccannon, PharmD, BCPS Pager: [phone] 08/04/2015 6:53 PM

## 2015-08-05 DIAGNOSIS — E662 Morbid (severe) obesity with alveolar hypoventilation: Secondary | ICD-10-CM | POA: Diagnosis present

## 2015-08-05 DIAGNOSIS — Z7901 Long term (current) use of anticoagulants: Secondary | ICD-10-CM

## 2015-08-05 DIAGNOSIS — E860 Dehydration: Secondary | ICD-10-CM

## 2015-08-05 DIAGNOSIS — R945 Abnormal results of liver function studies: Secondary | ICD-10-CM

## 2015-08-05 DIAGNOSIS — Z86711 Personal history of pulmonary embolism: Secondary | ICD-10-CM

## 2015-08-05 DIAGNOSIS — K810 Acute cholecystitis: Secondary | ICD-10-CM

## 2015-08-05 DIAGNOSIS — I10 Essential (primary) hypertension: Secondary | ICD-10-CM

## 2015-08-05 DIAGNOSIS — K830 Cholangitis: Secondary | ICD-10-CM

## 2015-08-05 DIAGNOSIS — F329 Major depressive disorder, single episode, unspecified: Secondary | ICD-10-CM

## 2015-08-05 DIAGNOSIS — R8271 Bacteriuria: Secondary | ICD-10-CM

## 2015-08-05 DIAGNOSIS — K8010 Calculus of gallbladder with chronic cholecystitis without obstruction: Secondary | ICD-10-CM | POA: Diagnosis present

## 2015-08-05 DIAGNOSIS — N3281 Overactive bladder: Secondary | ICD-10-CM

## 2015-08-05 DIAGNOSIS — I2782 Chronic pulmonary embolism: Secondary | ICD-10-CM | POA: Diagnosis not present

## 2015-08-05 DIAGNOSIS — R7989 Other specified abnormal findings of blood chemistry: Secondary | ICD-10-CM

## 2015-08-05 DIAGNOSIS — K8000 Calculus of gallbladder with acute cholecystitis without obstruction: Secondary | ICD-10-CM

## 2015-08-05 DIAGNOSIS — I2602 Saddle embolus of pulmonary artery with acute cor pulmonale: Secondary | ICD-10-CM | POA: Diagnosis not present

## 2015-08-05 LAB — CBC
HEMATOCRIT: 40.3 % (ref 36.0–46.0)
HEMOGLOBIN: 12.8 g/dL (ref 12.0–15.0)
MCH: 25.8 pg — AB (ref 26.0–34.0)
MCHC: 31.8 g/dL (ref 30.0–36.0)
MCV: 81.1 fL (ref 78.0–100.0)
PLATELETS: 168 10*3/uL (ref 150–400)
RBC: 4.97 MIL/uL (ref 3.87–5.11)
RDW: 14.9 % (ref 11.5–15.5)
WBC: 16 10*3/uL — ABNORMAL HIGH (ref 4.0–10.5)

## 2015-08-05 LAB — COMPREHENSIVE METABOLIC PANEL
ALBUMIN: 2.8 g/dL — AB (ref 3.5–5.0)
ALT: 225 U/L — AB (ref 14–54)
AST: 248 U/L — AB (ref 15–41)
Alkaline Phosphatase: 111 U/L (ref 38–126)
Anion gap: 10 (ref 5–15)
BUN: 11 mg/dL (ref 6–20)
CHLORIDE: 101 mmol/L (ref 101–111)
CO2: 27 mmol/L (ref 22–32)
CREATININE: 1.17 mg/dL — AB (ref 0.44–1.00)
Calcium: 8 mg/dL — ABNORMAL LOW (ref 8.9–10.3)
GFR calc Af Amer: 54 mL/min — ABNORMAL LOW (ref >60)
GFR, EST NON AFRICAN AMERICAN: 47 mL/min — AB (ref >60)
Glucose, Bld: 91 mg/dL (ref 65–99)
POTASSIUM: 3.2 mmol/L — AB (ref 3.5–5.1)
SODIUM: 138 mmol/L (ref 135–145)
Total Bilirubin: 5.6 mg/dL — ABNORMAL HIGH (ref 0.3–1.2)
Total Protein: 6.1 g/dL — ABNORMAL LOW (ref 6.5–8.1)

## 2015-08-05 LAB — APTT
APTT: 53 s — AB (ref 24–37)
aPTT: 59 seconds — ABNORMAL HIGH (ref 24–37)

## 2015-08-05 LAB — LACTIC ACID, PLASMA
LACTIC ACID, VENOUS: 1.3 mmol/L (ref 0.5–2.0)
Lactic Acid, Venous: 1.3 mmol/L (ref 0.5–2.0)

## 2015-08-05 LAB — MRSA PCR SCREENING: MRSA BY PCR: NEGATIVE

## 2015-08-05 LAB — HEPARIN LEVEL (UNFRACTIONATED): Heparin Unfractionated: 0.66 IU/mL (ref 0.30–0.70)

## 2015-08-05 MED ORDER — MORPHINE SULFATE (PF) 2 MG/ML IV SOLN: 2.0000 mg | INTRAVENOUS | Status: DC | PRN

## 2015-08-05 MED ORDER — ALBUTEROL SULFATE (2.5 MG/3ML) 0.083% IN NEBU
2.5000 mg | INHALATION_SOLUTION | Freq: Four times a day (QID) | RESPIRATORY_TRACT | Status: DC
  Administered 2015-08-05: 2.5 mg via RESPIRATORY_TRACT
  Filled 2015-08-05: qty 3

## 2015-08-05 MED ORDER — MIRABEGRON ER 25 MG PO TB24
50.0000 mg | ORAL_TABLET | Freq: Every day | ORAL | Status: DC
  Administered 2015-08-05 – 2015-08-11 (×8): 50 mg via ORAL
  Filled 2015-08-05 (×2): qty 2
  Filled 2015-08-05: qty 1
  Filled 2015-08-05 (×4): qty 2

## 2015-08-05 MED ORDER — HEPARIN (PORCINE) IN NACL 100-0.45 UNIT/ML-% IJ SOLN
1900.0000 [IU]/h | INTRAMUSCULAR | Status: DC
  Administered 2015-08-05: 1750 [IU]/h via INTRAVENOUS
  Administered 2015-08-05: 1600 [IU]/h via INTRAVENOUS
  Administered 2015-08-06 – 2015-08-09 (×4): 1900 [IU]/h via INTRAVENOUS
  Filled 2015-08-05 (×8): qty 250

## 2015-08-05 MED ORDER — TRAZODONE HCL 50 MG PO TABS: 50.0000 mg | ORAL_TABLET | Freq: Every evening | ORAL | Status: DC | PRN

## 2015-08-05 MED ORDER — ADULT MULTIVITAMIN W/MINERALS CH
1.0000 | ORAL_TABLET | Freq: Every day | ORAL | Status: DC
  Administered 2015-08-05 – 2015-08-11 (×6): 1 via ORAL
  Filled 2015-08-05 (×7): qty 1

## 2015-08-05 MED ORDER — HEPARIN BOLUS VIA INFUSION
3000.0000 [IU] | Freq: Once | INTRAVENOUS | Status: AC
  Administered 2015-08-05: 3000 [IU] via INTRAVENOUS
  Filled 2015-08-05: qty 3000

## 2015-08-05 MED ORDER — BUPROPION HCL 100 MG PO TABS
100.0000 mg | ORAL_TABLET | Freq: Three times a day (TID) | ORAL | Status: DC
  Administered 2015-08-05 – 2015-08-11 (×19): 100 mg via ORAL
  Filled 2015-08-05 (×21): qty 1

## 2015-08-05 MED ORDER — PRAVASTATIN SODIUM 40 MG PO TABS: 40.0000 mg | ORAL_TABLET | Freq: Every day | ORAL | Status: DC

## 2015-08-05 MED ORDER — AMLODIPINE BESYLATE 5 MG PO TABS
5.0000 mg | ORAL_TABLET | Freq: Every day | ORAL | Status: DC
  Administered 2015-08-05 – 2015-08-11 (×7): 5 mg via ORAL
  Filled 2015-08-05 (×7): qty 1

## 2015-08-05 MED ORDER — ALBUTEROL SULFATE (2.5 MG/3ML) 0.083% IN NEBU
2.5000 mg | INHALATION_SOLUTION | Freq: Four times a day (QID) | RESPIRATORY_TRACT | Status: DC | PRN
  Administered 2015-08-05: 2.5 mg via RESPIRATORY_TRACT
  Filled 2015-08-05: qty 3

## 2015-08-05 MED ORDER — POTASSIUM CHLORIDE CRYS ER 10 MEQ PO TBCR
10.0000 meq | EXTENDED_RELEASE_TABLET | Freq: Two times a day (BID) | ORAL | Status: DC
  Administered 2015-08-05 (×3): 10 meq via ORAL
  Filled 2015-08-05 (×4): qty 1

## 2015-08-05 MED ORDER — IPRATROPIUM-ALBUTEROL 0.5-2.5 (3) MG/3ML IN SOLN
3.0000 mL | RESPIRATORY_TRACT | Status: DC | PRN
  Administered 2015-08-05: 3 mL via RESPIRATORY_TRACT
  Filled 2015-08-05: qty 3

## 2015-08-05 MED ORDER — FUROSEMIDE 10 MG/ML IJ SOLN
60.0000 mg | Freq: Once | INTRAMUSCULAR | Status: AC
  Administered 2015-08-05: 60 mg via INTRAVENOUS
  Filled 2015-08-05: qty 6

## 2015-08-05 NOTE — Progress Notes (Signed)
ANTICOAGULATION CONSULT NOTE - Follow Up Consult  Pharmacy Consult for heparin Indication: h/o PE  Allergies  Allergen Reactions  . Lisinopril     REACTION: PT with cough    Patient Measurements: Height: 5' 7.5" (171.5 cm) Weight: (!) 385 lb 5.8 oz (174.8 kg) IBW/kg (Calculated) : 62.75 Heparin Dosing Weight: 105kg  Vital Signs: Temp: 99 F (37.2 C) (06/04 1124) Temp Source: Oral (06/04 1124) BP: 126/58 mmHg (06/04 1124) Pulse Rate: 100 (06/04 1124)  Labs:  Recent Labs  08/04/15 1441 08/04/15 1452 08/05/15 0652  HGB 16.0*  --  12.8  HCT 48.5*  --  40.3  PLT 206  --  168  APTT  --   --  59*  HEPARINUNFRC  --   --  0.66  CREATININE 1.15*  --  1.17*  TROPONINI  --  <0.03  --     Estimated Creatinine Clearance: 78.2 mL/min (by C-G formula based on Cr of 1.17).   Medical History: Past Medical History  Diagnosis Date  . Depression     followed by Dr. Evelene Croon  . Diverticulosis of colon   . History of hepatitis B   . Hypertension   . Arthritis     osteoarthritis , kness.  [name] History of pulmonary embolism 01/2002  . Obesity   . Fibrocystic breast disease   . Obstructive sleep apnea     refuses CPAP.  [name] Hyperlipidemia   . Chronic kidney disease     Renal insuffucinecy, cr-1.33 in 10/2005.  [name] Left shoulder pain     2/2 fall  . Neck nodule     not erythematous, movable like cyst located at the base of posterior neck on the left side(not painful), will need follow up for   . Pulmonary nodule     Stable on repeat imaging. LLL nodule 5 mm    Medications:  Prescriptions prior to admission  Medication Sig Dispense Refill Last Dose  . ALPRAZolam (XANAX) 1 MG tablet Take 1 mg by mouth at bedtime as needed for sleep.    Past Week at Unknown time  . amLODipine (NORVASC) 10 MG tablet Take 0.5 tablets (5 mg total) by mouth daily. 90 tablet 3 08/03/2015 at Unknown time  . buPROPion (WELLBUTRIN) 100 MG tablet Take 100 mg by mouth 3 (three) times daily.   Past Week at  Unknown time  . hydrochlorothiazide (HYDRODIURIL) 25 MG tablet Take 1 tablet (25 mg total) by mouth daily as needed (edema). 90 tablet 3 unknown  . Multiple Vitamin (MULTIVITAMIN) tablet Take 1 tablet by mouth daily. 30 tablet 3 08/03/2015 at Unknown time  . potassium chloride (K-DUR) 10 MEQ tablet Take 1 tablet (10 mEq total) by mouth 2 (two) times daily. 180 tablet 3 08/03/2015 at Unknown time  . pravastatin (PRAVACHOL) 40 MG tablet TAKE 1 TABLET EVERY DAY 90 tablet 3 08/03/2015 at Unknown time  . XARELTO 20 MG TABS tablet TAKE 1 TABLET EVERY DAY WITH SUPPER 90 tablet 3 08/03/2015 at 1230  . mirabegron ER (MYRBETRIQ) 50 MG TB24 tablet Take 1 tablet (50 mg total) by mouth daily. 90 tablet 3 hasnt started  . penicillin v potassium (VEETID) 500 MG tablet Take 1 tablet (500 mg total) by mouth 4 (four) times daily. (Patient not taking: Reported on 08/04/2015) 40 tablet 0 Completed Course at Unknown time   Scheduled:  . amLODipine  5 mg Oral Daily  . buPROPion  100 mg Oral TID  . mirabegron ER  50 mg Oral Daily  .  multivitamin with minerals  1 tablet Oral Daily  . piperacillin-tazobactam (ZOSYN)  IV  3.375 g Intravenous Q8H  . potassium chloride  10 mEq Oral BID  . pravastatin  40 mg Oral q1800  . sodium chloride  1,000 mL Intravenous Once   Infusions:  . sodium chloride 125 mL/hr at 08/05/15 0833  . heparin 1,600 Units/hr (08/05/15 0700)    Assessment: 70yo female c/o abdominal pain and emesis, admitted w/ probable cholecystitis, to be evaluated by surgeon, to begin heparin bridge while Xarelto on hold for h/o PE; last dose of Xarelto taken 6/2 at 1230. HL 0.66 falsely elevated given recent Xarelto - will dose heparin based on aPTT for now APTT slightly < goal at 59 sec CBC stable.    Goal of Therapy:  Heparin level 0.3-0.7 units/ml aPTT 66-102 seconds Monitor platelets by anticoagulation protocol: Yes   Plan:  Increase heparin drip 1750 uts/hr check aPTT 6hr after rate increase  Daily HL and  APTT, CBC  Leota SauersLisa Kashius Dominic Pharm.D. CPP, BCPS Clinical Pharmacist [phone] 08/05/2015 11:41 AM

## 2015-08-05 NOTE — Consult Note (Signed)
Reason for Consult: abdominal pain, nausea and vomiting Referring Physician: Rachael Fee  Nance TAYLEIGH [name] is an 70 y.o. female.  HPI: Pt brought to the ED with above symptoms that work her from her sleep  Around 0430 yesterday AM, prior hx of PUD, and she thought it was from that.  She has multiple medical issues and was transferred from [facility] to [facility] because she is followed by the Teaching Service. Work up on admission she was afebrile, but spiked a temp up to 103.9.  Some tachycardia, but otherwise stable. Labs on admit shows mild creatinine elevation, and elevated LFT's, AST 360, ALT184, Bilirubin 2.3, lactate 4.39, WBC 13.1, H/H 16/48.5.  UA positive for nitrites. 3way film shows pulmonary congestion, without frank edema, no bowel obstruction,  CT scan shows a distended gallbladder, and abdominal US shows 1.6 cm gallstone in the neck of the gallbladder with mild gallbladder wall thickening. CBD is 6 mm, no definite intrahepatic biliary dilatation. She was admitted by Medicine and we are ask to see.   Past Medical History  Diagnosis Date  . Depression     followed by Dr. Toy Care  . Diverticulosis of colon   . History of hepatitis B   . Hypertension   . Arthritis     osteoarthritis , kness.  [name] History of pulmonary embolism 01/2002  . Obesity   . Fibrocystic breast disease   . Obstructive sleep apnea     refuses CPAP.  [name] Hyperlipidemia   . Chronic kidney disease     Renal insuffucinecy, cr-1.33 in 10/2005.  [name] Left shoulder pain     2/2 fall  . Neck nodule     not erythematous, movable like cyst located at the base of posterior neck on the left side(not painful), will need follow up for   . Pulmonary nodule     Stable on repeat imaging. LLL nodule 5 mm    Past Surgical History  Procedure Laterality Date  . Gyn surgery      s/p excison of vulvar cyst 10/18/04 by dr. Chrissie NoaLaurance Flatten    Family History  Problem Relation Age of Onset  . Hypertension      family history   . Kidney disease Maternal Uncle     Social History:  reports that she quit smoking about 25 years ago. Her smoking use included Cigarettes. She has a 20 pack-year smoking history. She does not have any smokeless tobacco history on file. She reports that she does not drink alcohol or use illicit drugs.  Allergies:  Allergies  Allergen Reactions  . Lisinopril     REACTION: PT with cough    Medications:  Prior to Admission:  Prescriptions prior to admission  Medication Sig Dispense Refill Last Dose  . ALPRAZolam (XANAX) 1 MG tablet Take 1 mg by mouth at bedtime as needed for sleep.    Past Week at Unknown time  . amLODipine (NORVASC) 10 MG tablet Take 0.5 tablets (5 mg total) by mouth daily. 90 tablet 3 08/03/2015 at Unknown time  . buPROPion (WELLBUTRIN) 100 MG tablet Take 100 mg by mouth 3 (three) times daily.   Past Week at Unknown time  . hydrochlorothiazide (HYDRODIURIL) 25 MG tablet Take 1 tablet (25 mg total) by mouth daily as needed (edema). 90 tablet 3 unknown  . Multiple Vitamin (MULTIVITAMIN) tablet Take 1 tablet by mouth daily. 30 tablet 3 08/03/2015 at Unknown time  . potassium chloride (K-DUR) 10 MEQ tablet Take 1 tablet (10 mEq  total) by mouth 2 (two) times daily. 180 tablet 3 08/03/2015 at Unknown time  . pravastatin (PRAVACHOL) 40 MG tablet TAKE 1 TABLET EVERY DAY 90 tablet 3 08/03/2015 at Unknown time  . XARELTO 20 MG TABS tablet TAKE 1 TABLET EVERY DAY WITH SUPPER 90 tablet 3 08/03/2015 at 1230  . mirabegron ER (MYRBETRIQ) 50 MG TB24 tablet Take 1 tablet (50 mg total) by mouth daily. 90 tablet 3 hasnt started  . penicillin v potassium (VEETID) 500 MG tablet Take 1 tablet (500 mg total) by mouth 4 (four) times daily. (Patient not taking: Reported on 08/04/2015) 40 tablet 0 Completed Course at Unknown time   Scheduled: . amLODipine  5 mg Oral Daily  . buPROPion  100 mg Oral TID  . mirabegron ER  50 mg Oral Daily  . multivitamin with minerals  1 tablet Oral Daily  .  piperacillin-tazobactam (ZOSYN)  IV  3.375 g Intravenous Q8H  . potassium chloride  10 mEq Oral BID  . pravastatin  40 mg Oral q1800  . sodium chloride  1,000 mL Intravenous Once   Continuous: . sodium chloride 125 mL/hr at 08/05/15 0600  . heparin 16 Units (08/05/15 0600)    Results for orders placed or performed during the hospital encounter of 08/04/15 (from the past 48 hour(s))  CBC with Differential/Platelet     Status: Abnormal   Collection Time: 08/04/15  2:41 PM  Result Value Ref Range   WBC 13.1 (H) 4.0 - 10.5 K/uL   RBC 5.94 (H) 3.87 - 5.11 MIL/uL   Hemoglobin 16.0 (H) 12.0 - 15.0 g/dL   HCT 48.5 (H) 36.0 - 46.0 %   MCV 81.6 78.0 - 100.0 fL   MCH 26.9 26.0 - 34.0 pg   MCHC 33.0 30.0 - 36.0 g/dL   RDW 14.5 11.5 - 15.5 %   Platelets 206 150 - 400 K/uL   Neutrophils Relative % 95 %   Neutro Abs 12.4 (H) 1.7 - 7.7 K/uL   Lymphocytes Relative 4 %   Lymphs Abs 0.6 (L) 0.7 - 4.0 K/uL   Monocytes Relative 1 %   Monocytes Absolute 0.1 0.1 - 1.0 K/uL   Eosinophils Relative 0 %   Eosinophils Absolute 0.0 0.0 - 0.7 K/uL   Basophils Relative 0 %   Basophils Absolute 0.0 0.0 - 0.1 K/uL  Comprehensive metabolic panel     Status: Abnormal   Collection Time: 08/04/15  2:41 PM  Result Value Ref Range   Sodium 135 135 - 145 mmol/L   Potassium 3.5 3.5 - 5.1 mmol/L   Chloride 96 (L) 101 - 111 mmol/L   CO2 28 22 - 32 mmol/L   Glucose, Bld 204 (H) 65 - 99 mg/dL   BUN 13 6 - 20 mg/dL   Creatinine, Ser 1.15 (H) 0.44 - 1.00 mg/dL   Calcium 9.2 8.9 - 10.3 mg/dL   Total Protein 8.8 (H) 6.5 - 8.1 g/dL   Albumin 4.4 3.5 - 5.0 g/dL   AST 360 (H) 15 - 41 U/L   ALT 184 (H) 14 - 54 U/L   Alkaline Phosphatase 124 38 - 126 U/L   Total Bilirubin 2.3 (H) 0.3 - 1.2 mg/dL   GFR calc non Af Amer 48 (L) >60 mL/min   GFR calc Af Amer 55 (L) >60 mL/min    Comment: (NOTE) The eGFR has been calculated using the CKD EPI equation. This calculation has not been validated in all clinical  situations. eGFR's persistently <60 mL/min signify  possible Chronic Kidney Disease.    Anion gap 11 5 - 15  Lipase, blood     Status: None   Collection Time: 08/04/15  2:41 PM  Result Value Ref Range   Lipase 24 11 - 51 U/L  Troponin I     Status: None   Collection Time: 08/04/15  2:52 PM  Result Value Ref Range   Troponin I <0.03 <0.031 ng/mL    Comment:        NO INDICATION OF MYOCARDIAL INJURY.   I-Stat CG4 Lactic Acid, ED     Status: Abnormal   Collection Time: 08/04/15  3:00 PM  Result Value Ref Range   Lactic Acid, Venous 2.55 (HH) 0.5 - 2.0 mmol/L   Comment NOTIFIED PHYSICIAN   Urinalysis, Routine w reflex microscopic (not at [facility])     Status: Abnormal   Collection Time: 08/04/15  5:00 PM  Result Value Ref Range   Color, Urine AMBER (A) YELLOW    Comment: BIOCHEMICALS MAY BE AFFECTED BY COLOR   APPearance CLOUDY (A) CLEAR   Specific Gravity, Urine 1.042 (H) 1.005 - 1.030   pH 6.0 5.0 - 8.0   Glucose, UA NEGATIVE NEGATIVE mg/dL   Hgb urine dipstick NEGATIVE NEGATIVE   Bilirubin Urine SMALL (A) NEGATIVE   Ketones, ur NEGATIVE NEGATIVE mg/dL   Protein, ur 30 (A) NEGATIVE mg/dL   Nitrite POSITIVE (A) NEGATIVE   Leukocytes, UA NEGATIVE NEGATIVE  Urine microscopic-add on     Status: Abnormal   Collection Time: 08/04/15  5:00 PM  Result Value Ref Range   Squamous Epithelial / LPF NONE SEEN NONE SEEN   WBC, UA 0-5 0 - 5 WBC/hpf   RBC / HPF NONE SEEN 0 - 5 RBC/hpf   Bacteria, UA MANY (A) NONE SEEN   Urine-Other MUCOUS PRESENT   I-Stat CG4 Lactic Acid, ED  (not at  [facility])     Status: Abnormal   Collection Time: 08/04/15  6:08 PM  Result Value Ref Range   Lactic Acid, Venous 4.39 (HH) 0.5 - 2.0 mmol/L   Comment NOTIFIED PHYSICIAN   MRSA PCR Screening     Status: None   Collection Time: 08/04/15 10:52 PM  Result Value Ref Range   MRSA by PCR NEGATIVE NEGATIVE    Comment:        The GeneXpert MRSA Assay (FDA approved for NASAL specimens only), is one component of  a comprehensive MRSA colonization surveillance program. It is not intended to diagnose MRSA infection nor to guide or monitor treatment for MRSA infections.   Lactic acid, plasma     Status: None   Collection Time: 08/05/15  1:44 AM  Result Value Ref Range   Lactic Acid, Venous 1.3 0.5 - 2.0 mmol/L  Heparin level (unfractionated)     Status: None   Collection Time: 08/05/15  6:52 AM  Result Value Ref Range   Heparin Unfractionated 0.66 0.30 - 0.70 IU/mL    Comment:        IF HEPARIN RESULTS ARE BELOW EXPECTED VALUES, AND PATIENT DOSAGE HAS BEEN CONFIRMED, SUGGEST FOLLOW UP TESTING OF ANTITHROMBIN III LEVELS.   CBC     Status: Abnormal   Collection Time: 08/05/15  6:52 AM  Result Value Ref Range   WBC 16.0 (H) 4.0 - 10.5 K/uL   RBC 4.97 3.87 - 5.11 MIL/uL   Hemoglobin 12.8 12.0 - 15.0 g/dL   HCT 40.3 36.0 - 46.0 %   MCV 81.1 78.0 - 100.0 fL  MCH 25.8 (L) 26.0 - 34.0 pg   MCHC 31.8 30.0 - 36.0 g/dL   RDW 14.9 11.5 - 15.5 %   Platelets 168 150 - 400 K/uL    US Abdomen Complete  08/04/2015  CLINICAL DATA:  70 year old female with diffuse abdominal pain for 1 day. EXAM: ABDOMEN ULTRASOUND COMPLETE COMPARISON:  08/04/2015 CT FINDINGS: Gallbladder: A 1.6 cm nonmobile gallstone is identified at the neck. Mild gallbladder wall thickening is noted. There is no evidence of pericholecystic fluid or definite sonographic Murphy's sign. Common bile duct: Diameter: 6 mm. No definite intrahepatic biliary dilatation. Liver: Heterogeneous echotexture. The liver is difficult to visualize secondary to body habitus. IVC: Not well visualized Pancreas: Not well visualized Spleen: Size and appearance within normal limits. Right Kidney: Length: 10.1 cm. Echogenicity within normal limits. No mass or hydronephrosis visualized. Left Kidney: Length: 17.1 cm. A 9 cm upper pole cyst is again identified. Echogenicity within normal limits. No mass or hydronephrosis visualized. Abdominal aorta: Not well  visualized Other findings: No free fluid IMPRESSION: 1.6 cm nonmobile gallstone at the neck with mild gallbladder wall thickening -equivocal for acute cholecystitis. Liver, IVC, pancreas and abdominal aorta not well visualized secondary to body habitus. Electronically Signed   By: Margarette Canada M.D.   On: 08/04/2015 18:33   Ct Abdomen Pelvis W Contrast  08/04/2015  CLINICAL DATA:  Acute onset vomiting and generalized abdominal pain earlier today. Hepatitis B. Diverticulosis. EXAM: CT ABDOMEN AND PELVIS WITH CONTRAST TECHNIQUE: Multidetector CT imaging of the abdomen and pelvis was performed using the standard protocol following bolus administration of intravenous contrast. CONTRAST:  118m ISOVUE-300 IOPAMIDOL (ISOVUE-300) INJECTION 61% COMPARISON:  None. FINDINGS: Lower chest: Dependent atelectasis noted in both lung bases. Small hiatal hernia also seen. Hepatobiliary: No liver masses identified. Gallbladder is distended, however there is no evidence of gallbladder wall thickening or pericholecystic inflammatory changes. There is a possible small stone in the dependent portion the gallbladder. No evidence of biliary ductal dilatation. Pancreas: No mass, inflammatory changes, or other significant abnormality. Spleen: Within normal limits in size and appearance. Adrenals/Urinary Tract: No masses identified. No evidence of hydronephrosis. Large simple left renal cyst seen measuring 9 cm. Stomach/Bowel: No evidence of obstruction, inflammatory process, or abnormal fluid collections. Diverticulosis is seen involving the descending sigmoid colon, however there is no evidence of diverticulitis. Normal appendix visualized. Vascular/Lymphatic: No pathologically enlarged lymph nodes. No evidence of abdominal aortic aneurysm. Reproductive: No mass or other significant abnormality. Other: None. Musculoskeletal:  No suspicious bone lesions identified. IMPRESSION: Distended gallbladder, with possible small gallstone. No  radiographic evidence of acute cholecystitis or biliary dilatation. Recommend clinical correlation, and consider abdominal ultrasound for further evaluation if warranted. Colonic diverticulosis. No radiographic evidence of diverticulitis. Small hiatal hernia. Dependent atelectasis in both lung bases. Electronically Signed   By: JEarle GellM.D.   On: 08/04/2015 16:59   Dg Abd Acute W/chest  08/04/2015  CLINICAL DATA:  Shortness of breath with abdominal pain and vomiting EXAM: DG ABDOMEN ACUTE W/ 1V CHEST COMPARISON:  Chest radiograph October 26, 2013 FINDINGS: PA chest: There is cardiomegaly with pulmonary venous hypertension. No edema or consolidation. No adenopathy. Supine and left lateral decubitus abdomen images: There is fairly diffuse stool throughout the colon. There is no appreciable bowel dilatation or air-fluid level. No free air. No abnormal calcifications. IMPRESSION: Evidence of pulmonary vascular congestion without frank edema or consolidation. There is fairly diffuse stool throughout colon. No bowel obstruction or free air evident. Electronically Signed   By:  Lowella Grip III M.D.   On: 08/04/2015 15:39    Review of Systems  Constitutional: Positive for fever and weight loss (30 pound weight gain over the last year, she attributes to her depression). Negative for malaise/fatigue and diaphoresis.  HENT: Negative for congestion, ear pain, hearing loss and tinnitus.        She has dental issues and cannot afford to have them taken care of.  Eyes: Negative.        Needs reading glasses  Respiratory: Positive for shortness of breath (DOE 50 feet without support). Negative for cough, hemoptysis, sputum production and wheezing.   Cardiovascular: Positive for leg swelling (ankles).  Gastrointestinal: Positive for nausea, vomiting, abdominal pain and constipation. Negative for diarrhea and blood in stool.  Genitourinary:       Chronically incontinent   Musculoskeletal: Positive for joint  pain.  Skin: Negative.   Neurological: Negative.  Negative for weakness.  Endo/Heme/Allergies: Bruises/bleeds easily.  Psychiatric/Behavioral: Positive for depression.   Blood pressure 119/65, pulse 99, temperature 99.9 F (37.7 C), temperature source Oral, resp. rate 26, height 5' 7.5" (1.715 m), weight 174.8 kg (385 lb 5.8 oz), SpO2 93 %. Physical Exam  Constitutional: She is oriented to person, place, and time. No distress.  BMI 53, she reports depression as her primary issue  HENT:  Head: Normocephalic and atraumatic.  Eyes: Right eye exhibits no discharge. Left eye exhibits no discharge. No scleral icterus.  Neck: Normal range of motion. Neck supple. No JVD present. No tracheal deviation present. No thyromegaly present.  Cardiovascular: Normal rate, regular rhythm, normal heart sounds and intact distal pulses.   No murmur heard. Respiratory: Effort normal and breath sounds normal. No respiratory distress. She has no wheezes. She has no rales. She exhibits no tenderness.  GI: Soft. Bowel sounds are normal. She exhibits no distension and no mass. There is no tenderness. There is no rebound and no guarding.  Currently pain free  Musculoskeletal: She exhibits no edema.  Lymphadenopathy:    She has no cervical adenopathy.  Neurological: She is alert and oriented to person, place, and time. No cranial nerve deficit.  Skin: Skin is warm and dry. No rash noted. She is not diaphoretic. No erythema. No pallor.  Psychiatric: She has a normal mood and affect. Her behavior is normal. Judgment and thought content normal.    Assessment/Plan: Acute cholecystitis/cholelithiasis Elevated LFT's Morbid obesity Body mass index is 59.43  Pulmonary Emboli on Xarelto (last dose 08/13/15 @ 12:30 PM) OSA on CPAP Chronic renal insuffiencey Hepatitis B Limited mobility uses a walker  Depression Arthritis  Hypertension  Plan:  She will need to be off the Xarelto for 3-5 days before we would recommend  surgery. We will follow LFT's with you. If they improve we should be able to cholecystectomy when cleared of Xarelto. She needs to be cleared by Medicine before surgery also.  Agree with antibiotics and clear liquids for now.  She is on a Heparin drip for anticoagulation currently.  Will review with Dr. Hulen Skains.    Darby Fleeman 08/05/2015, 7:44 AM

## 2015-08-05 NOTE — Progress Notes (Addendum)
ANTICOAGULATION CONSULT NOTE - Follow Up Consult  Pharmacy Consult for heparin Indication: h/o PE  Allergies  Allergen Reactions  . Lisinopril     REACTION: PT with cough    Patient Measurements: Height: 5' 7.5" (171.5 cm) Weight: (!) 385 lb 5.8 oz (174.8 kg) IBW/kg (Calculated) : 62.75 Heparin Dosing Weight: 105kg  Vital Signs: Temp: 100 F (37.8 C) (06/04 1457) Temp Source: Oral (06/04 1457) BP: 139/56 mmHg (06/04 1457) Pulse Rate: 112 (06/04 1457)  Labs:  Recent Labs  08/04/15 1441 08/04/15 1452 08/05/15 0652 08/05/15 1749  HGB 16.0*  --  12.8  --   HCT 48.5*  --  40.3  --   PLT 206  --  168  --   APTT  --   --  59* 53*  HEPARINUNFRC  --   --  0.66  --   CREATININE 1.15*  --  1.17*  --   TROPONINI  --  <0.03  --   --     Estimated Creatinine Clearance: 78.2 mL/min (by C-G formula based on Cr of 1.17).   Scheduled:  . amLODipine  5 mg Oral Daily  . buPROPion  100 mg Oral TID  . mirabegron ER  50 mg Oral Daily  . multivitamin with minerals  1 tablet Oral Daily  . piperacillin-tazobactam (ZOSYN)  IV  3.375 g Intravenous Q8H  . potassium chloride  10 mEq Oral BID  . sodium chloride  1,000 mL Intravenous Once   Infusions:  . heparin 1,750 Units/hr (08/05/15 1731)    Assessment: 70yo female c/o abdominal pain and emesis, admitted w/ probable cholecystitis, to be evaluated by surgeon, to begin heparin bridge while Xarelto on hold for h/o PE; last dose of Xarelto taken 6/2 at 1230. -aPTT= 53 after heparin increase to 1750 units/hr  Goal of Therapy:  Heparin level 0.3-0.7 units/ml aPTT 66-102 seconds Monitor platelets by anticoagulation protocol: Yes   Plan:   Increase heparin to 1900 units/hr Daily HL and APTT, CBC  Harland GermanAndrew Nickolai Rinks, Pharm D 08/05/2015 7:55 PM

## 2015-08-05 NOTE — Progress Notes (Signed)
PT with increased O2 demand. C/o SOB, RR high 20-40s, shallow, exp wheezes. MD paged. New orders given. Will cont to monitor.

## 2015-08-05 NOTE — Progress Notes (Signed)
Patient sats maintaining mid 80s on [place], RR 30s, c/o SOB with exertion. MD called. VM placed. New orders given. Will cont to monitor.

## 2015-08-05 NOTE — Progress Notes (Signed)
ANTICOAGULATION CONSULT NOTE - Initial Consult  Pharmacy Consult for heparin Indication: h/o PE  Allergies  Allergen Reactions  . Lisinopril     REACTION: PT with cough    Patient Measurements: Height: 5' 7.5" (171.5 cm) Weight: (!) 373 lb (169.192 kg) IBW/kg (Calculated) : 62.75 Heparin Dosing Weight: 105kg  Vital Signs: Temp: 99.9 F (37.7 C) (06/03 2210) Temp Source: Oral (06/03 2210) BP: 146/70 mmHg (06/03 2210) Pulse Rate: 111 (06/03 2100)  Labs:  Recent Labs  08/04/15 1441 08/04/15 1452  HGB 16.0*  --   HCT 48.5*  --   PLT 206  --   CREATININE 1.15*  --   TROPONINI  --  <0.03    Estimated Creatinine Clearance: 77.9 mL/min (by C-G formula based on Cr of 1.15).   Medical History: Past Medical History  Diagnosis Date  . Depression     followed by Dr. Evelene CroonKaur  . Diverticulosis of colon   . History of hepatitis B   . Hypertension   . Arthritis     osteoarthritis , kness.  [name]. History of pulmonary embolism 01/2002  . Obesity   . Fibrocystic breast disease   . Obstructive sleep apnea     refuses CPAP.  [name]. Hyperlipidemia   . Chronic kidney disease     Renal insuffucinecy, cr-1.33 in 10/2005.  [name]. Left shoulder pain     2/2 fall  . Neck nodule     not erythematous, movable like cyst located at the base of posterior neck on the left side(not painful), will need follow up for   . Pulmonary nodule     Stable on repeat imaging. LLL nodule 5 mm    Medications:  Prescriptions prior to admission  Medication Sig Dispense Refill Last Dose  . ALPRAZolam (XANAX) 1 MG tablet Take 1 mg by mouth at bedtime as needed for sleep.    Past Week at Unknown time  . amLODipine (NORVASC) 10 MG tablet Take 0.5 tablets (5 mg total) by mouth daily. 90 tablet 3 08/03/2015 at Unknown time  . buPROPion (WELLBUTRIN) 100 MG tablet Take 100 mg by mouth 3 (three) times daily.   Past Week at Unknown time  . hydrochlorothiazide (HYDRODIURIL) 25 MG tablet Take 1 tablet (25 mg total) by mouth  daily as needed (edema). 90 tablet 3 unknown  . Multiple Vitamin (MULTIVITAMIN) tablet Take 1 tablet by mouth daily. 30 tablet 3 08/03/2015 at Unknown time  . potassium chloride (K-DUR) 10 MEQ tablet Take 1 tablet (10 mEq total) by mouth 2 (two) times daily. 180 tablet 3 08/03/2015 at Unknown time  . pravastatin (PRAVACHOL) 40 MG tablet TAKE 1 TABLET EVERY DAY 90 tablet 3 08/03/2015 at Unknown time  . XARELTO 20 MG TABS tablet TAKE 1 TABLET EVERY DAY WITH SUPPER 90 tablet 3 08/03/2015 at 1230  . mirabegron ER (MYRBETRIQ) 50 MG TB24 tablet Take 1 tablet (50 mg total) by mouth daily. 90 tablet 3 hasnt started  . penicillin v potassium (VEETID) 500 MG tablet Take 1 tablet (500 mg total) by mouth 4 (four) times daily. (Patient not taking: Reported on 08/04/2015) 40 tablet 0 Completed Course at Unknown time   Scheduled:  . amLODipine  5 mg Oral Daily  . buPROPion  100 mg Oral TID  . mirabegron ER  50 mg Oral Daily  . multivitamin  1 tablet Oral Daily  . piperacillin-tazobactam (ZOSYN)  IV  3.375 g Intravenous Q8H  . pravastatin  40 mg Oral Daily  . sodium  chloride  1,000 mL Intravenous Once   Infusions:  . sodium chloride Stopped (08/04/15 2020)    Assessment: 70yo female c/o abdominal pain and emesis, admitted w/ probable cholecystitis, to be evaluated by surgeon, to begin heparin bridge while Xarelto on hold for h/o PE; last dose of Xarelto taken 6/2 at 1230.  Goal of Therapy:  Heparin level 0.3-0.7 units/ml aPTT 66-102 seconds Monitor platelets by anticoagulation protocol: Yes   Plan:  Will give heparin bolus of 3000 units followed by gtt at 1600 units/hr and monitor heparin levels, PTT (heparin levels are likely altered 2/2 Xarelto), and CBC.  Vernard Gambles, PharmD, BCPS  08/05/2015,12:09 AM

## 2015-08-05 NOTE — Progress Notes (Signed)
Notified by RN that patient experiencing inspiratory wheezing, tachypneic, satting 86-88% on 3L [place] requiring uptitration to 5L [place]. Patient reports she first noticed the wheezing after sitting up during exam this morning.  She then became SOB when she tried to lie flat for a nap.  She reports h/o OSA but does not use CPAP.  PE: Filed Vitals:   08/05/15 1021 08/05/15 1124  BP: 121/54 126/58  Pulse:  100  Temp:  99 F (37.2 C)  Resp:  37   Gen: Morbidly obese female, sitting in bed, NAD Pulm: Scattered expiratory wheezes. No stridor. CV: RRR.  No murmurs appreciated.  A/P: OHS/OSA.  Patient's SOB worsened after trying to lie flat.  She is noncompliant with CPAP.  She feels like she is at baseline except for the wheezes, therefore, she is likely chronically hypoxic at home.  Given her probably OHS, have modified O2 paramaters to target 88-92%.  Will also provide Albuterol nebulizer given her wheezes.

## 2015-08-05 NOTE — Progress Notes (Signed)
MD's @ the bedside aware of of pt's oxygen needs as well as her respirations in the upper 30's. Md reaffirmed that there goal was to keep saturations 88-92.  Pt appears winded, but states she feels much better.

## 2015-08-05 NOTE — Progress Notes (Signed)
Subjective: [name]  Patient reports she is currently having hunger pains.  She continues to have right sided/epigastric pain. She denies fever, chills, nausea, or vomiting.  Objective: Vital signs in last 24 hours: Filed Vitals:   08/04/15 2100 08/04/15 2210 08/05/15 0023 08/05/15 0355  BP: 142/84 146/70 129/69 119/65  Pulse: 111  103 99  Temp:  99.9 F (37.7 C)    TempSrc:  Oral    Resp: 26  29 26   Height:  5' 7.5" (1.715 m)    Weight:  385 lb 5.8 oz (174.8 kg)    SpO2: 95%  94% 93%   Weight change:   Intake/Output Summary (Last 24 hours) at 08/05/15 0654 Last data filed at 08/05/15 0600  Gross per 24 hour  Intake 1413.49 ml  Output      0 ml  Net 1413.49 ml   Physical Exam  Constitutional: She is oriented to person, place, and time.  Morbidly obese female, lying in bed, NAD.  HENT:  Head: Normocephalic and atraumatic.  Eyes: EOM are normal. No scleral icterus.  Cardiovascular: Regular rhythm and normal heart sounds.   Tachycardic. Heart sounds distant, but no murmurs or gallops appreciated.  Pulmonary/Chest: Effort normal. No stridor. No respiratory distress. She has no rales.  Minimal scattered wheezes.  Abdominal: Soft. She exhibits no distension. There is no rebound and no guarding.  Moderately TTP in RUQ.  Nontender in epigastrum.  Musculoskeletal:  Trace pitting edema to midshin.  Neurological: She is alert and oriented to person, place, and time.  Skin: Skin is warm and dry.    Lab Results: Basic Metabolic Panel:  Recent Labs Lab 08/04/15 1441  NA 135  K 3.5  CL 96*  CO2 28  GLUCOSE 204*  BUN 13  CREATININE 1.15*  CALCIUM 9.2   Liver Function Tests:  Recent Labs Lab 08/04/15 1441  AST 360*  ALT 184*  ALKPHOS 124  BILITOT 2.3*  PROT 8.8*  ALBUMIN 4.4    Recent Labs Lab 08/04/15 1441  LIPASE 24   No results for input(s): AMMONIA in the last 168 hours. CBC:  Recent Labs Lab 08/04/15 1441  WBC 13.1*  NEUTROABS 12.4*  HGB  16.0*  HCT 48.5*  MCV 81.6  PLT 206   Cardiac Enzymes:  Recent Labs Lab 08/04/15 1452  TROPONINI <0.03   BNP: No results for input(s): PROBNP in the last 168 hours. D-Dimer: No results for input(s): DDIMER in the last 168 hours. CBG: No results for input(s): GLUCAP in the last 168 hours. Hemoglobin A1C: No results for input(s): HGBA1C in the last 168 hours. Fasting Lipid Panel: No results for input(s): CHOL, HDL, LDLCALC, TRIG, CHOLHDL, LDLDIRECT in the last 168 hours. Thyroid Function Tests: No results for input(s): TSH, T4TOTAL, FREET4, T3FREE, THYROIDAB in the last 168 hours. Coagulation: No results for input(s): LABPROT, INR in the last 168 hours. Anemia Panel: No results for input(s): VITAMINB12, FOLATE, FERRITIN, TIBC, IRON, RETICCTPCT in the last 168 hours. Urine Drug Screen: Drugs of Abuse  No results found for: LABOPIA, COCAINSCRNUR, LABBENZ, AMPHETMU, THCU, LABBARB  Alcohol Level: No results for input(s): ETH in the last 168 hours. Urinalysis:  Recent Labs Lab 08/04/15 1700  COLORURINE AMBER*  LABSPEC 1.042*  PHURINE 6.0  GLUCOSEU NEGATIVE  HGBUR NEGATIVE  BILIRUBINUR SMALL*  KETONESUR NEGATIVE  PROTEINUR 30*  NITRITE POSITIVE*  LEUKOCYTESUR NEGATIVE   Misc. Labs:   Micro Results: Recent Results (from the past 240 hour(s))  MRSA PCR Screening  Status: None   Collection Time: 08/04/15 10:52 PM  Result Value Ref Range Status   MRSA by PCR NEGATIVE NEGATIVE Final    Comment:        The GeneXpert MRSA Assay (FDA approved for NASAL specimens only), is one component of a comprehensive MRSA colonization surveillance program. It is not intended to diagnose MRSA infection nor to guide or monitor treatment for MRSA infections.    Studies/Results: US Abdomen Complete  08/04/2015  CLINICAL DATA:  70 year old female with diffuse abdominal pain for 1 day. EXAM: ABDOMEN ULTRASOUND COMPLETE COMPARISON:  08/04/2015 CT FINDINGS: Gallbladder: A 1.6 cm  nonmobile gallstone is identified at the neck. Mild gallbladder wall thickening is noted. There is no evidence of pericholecystic fluid or definite sonographic Murphy's sign. Common bile duct: Diameter: 6 mm. No definite intrahepatic biliary dilatation. Liver: Heterogeneous echotexture. The liver is difficult to visualize secondary to body habitus. IVC: Not well visualized Pancreas: Not well visualized Spleen: Size and appearance within normal limits. Right Kidney: Length: 10.1 cm. Echogenicity within normal limits. No mass or hydronephrosis visualized. Left Kidney: Length: 17.1 cm. A 9 cm upper pole cyst is again identified. Echogenicity within normal limits. No mass or hydronephrosis visualized. Abdominal aorta: Not well visualized Other findings: No free fluid IMPRESSION: 1.6 cm nonmobile gallstone at the neck with mild gallbladder wall thickening -equivocal for acute cholecystitis. Liver, IVC, pancreas and abdominal aorta not well visualized secondary to body habitus. Electronically Signed   By: Harmon Pier M.D.   On: 08/04/2015 18:33   Ct Abdomen Pelvis W Contrast  08/04/2015  CLINICAL DATA:  Acute onset vomiting and generalized abdominal pain earlier today. Hepatitis B. Diverticulosis. EXAM: CT ABDOMEN AND PELVIS WITH CONTRAST TECHNIQUE: Multidetector CT imaging of the abdomen and pelvis was performed using the standard protocol following bolus administration of intravenous contrast. CONTRAST:  ISOVUE-300 IOPAMIDOL (ISOVUE-300) INJECTION 61% COMPARISON:  None. FINDINGS: Lower chest: Dependent atelectasis noted in both lung bases. Small hiatal hernia also seen. Hepatobiliary: No liver masses identified. Gallbladder is distended, however there is no evidence of gallbladder wall thickening or pericholecystic inflammatory changes. There is a possible small stone in the dependent portion the gallbladder. No evidence of biliary ductal dilatation. Pancreas: No mass, inflammatory changes, or other significant  abnormality. Spleen: Within normal limits in size and appearance. Adrenals/Urinary Tract: No masses identified. No evidence of hydronephrosis. Large simple left renal cyst seen measuring 9 cm. Stomach/Bowel: No evidence of obstruction, inflammatory process, or abnormal fluid collections. Diverticulosis is seen involving the descending sigmoid colon, however there is no evidence of diverticulitis. Normal appendix visualized. Vascular/Lymphatic: No pathologically enlarged lymph nodes. No evidence of abdominal aortic aneurysm. Reproductive: No mass or other significant abnormality. Other: None. Musculoskeletal:  No suspicious bone lesions identified. IMPRESSION: Distended gallbladder, with possible small gallstone. No radiographic evidence of acute cholecystitis or biliary dilatation. Recommend clinical correlation, and consider abdominal ultrasound for further evaluation if warranted. Colonic diverticulosis. No radiographic evidence of diverticulitis. Small hiatal hernia. Dependent atelectasis in both lung bases. Electronically Signed   By: Myles Rosenthal M.D.   On: 08/04/2015 16:59   Dg Abd Acute W/chest  08/04/2015  CLINICAL DATA:  Shortness of breath with abdominal pain and vomiting EXAM: DG ABDOMEN ACUTE W/ 1V CHEST COMPARISON:  Chest radiograph October 26, 2013 FINDINGS: PA chest: There is cardiomegaly with pulmonary venous hypertension. No edema or consolidation. No adenopathy. Supine and left lateral decubitus abdomen images: There is fairly diffuse stool throughout the colon. There is no appreciable  bowel dilatation or air-fluid level. No free air. No abnormal calcifications. IMPRESSION: Evidence of pulmonary vascular congestion without frank edema or consolidation. There is fairly diffuse stool throughout colon. No bowel obstruction or free air evident. Electronically Signed   By: Bretta Bang III M.D.   On: 08/04/2015 15:39   Medications: I have reviewed the patient's current medications. Scheduled  Meds: . amLODipine  5 mg Oral Daily  . buPROPion  100 mg Oral TID  . mirabegron ER  50 mg Oral Daily  . multivitamin with minerals  1 tablet Oral Daily  . piperacillin-tazobactam (ZOSYN)  IV  3.375 g Intravenous Q8H  . potassium chloride  10 mEq Oral BID  . pravastatin  40 mg Oral q1800  . sodium chloride  1,000 mL Intravenous Once   Continuous Infusions: . sodium chloride 125 mL/hr at 08/05/15 0600  . heparin 16 Units (08/05/15 0600)   PRN Meds:.morphine injection Assessment/Plan: Principal Problem:   Cholecystitis, acute Active Problems:   Hyperlipidemia   Severe obesity (BMI >= 40) (HCC)   Major depression, chronic (HCC)   OSA (obstructive sleep apnea)   HTN (hypertension)   GERD   OA (osteoarthritis) of knee   PULMONARY EMBOLISM, HX OF   Antiphospholipid syndrome (HCC)   Pulmonary hypertension (HCC)   Urge incontinence  Ms. Mccaul is a 70 yo F with a PMHx of diverticulosis, Hep B, PE, HTN, OSA, HLD, CKD presenting to the hospital complaining of right sided abdominal pain.  Sepsis 2/2 Cholecystitis and Cholangitis: Patient is presenting with acute onset right sided abdominal pain and non-bloody emesis. Evidence of cholecystitis on CT and [place] with elevated transaminases. Rising TBili and WBC 16 also c/f cholangitis.  No e/o pancreatitis as lipase is normal.  Surgery recommending continued antibiotics and GI consult to evaluate need for ERCP given her elevated transaminases.  - Surgery following, appreciate recs.  - GI consult, appreciate rec's - Zosyn  MRCP  BCx  Asymptomatic bacteriuria: UA showing many bacteria and positive nitrites. However, patient is asymptomatic.   UCx - Zosyn as above  History of PE and Antiphospholipid: On Xarelto at home. - Hold Xarelto in setting of probably surgery - Heparin gtt   HTN: Amlodipine 5 mg daily at home. BP stable.  - HOLD PO meds  HLD: Pravastatin 40 mg daily at home - HOLD PO meds  Depression: Wellbutrin 100  mg TID at home.  - HOLD PO meds  Overactive bladder: Mirabegron at home - HOLD PO meds   DVT ppx: Heparin  Diet: clear liquids  Code: Full (confimed with patient)  Dispo: Disposition is deferred at this time, awaiting improvement of current medical problems.  Anticipated discharge in approximately 3-5 day(s).   The patient does have a current PCP [name], MD) and does need an [facility] hospital follow-up appointment after discharge.  The patient does not have transportation limitations that hinder transportation to clinic appointments.  .Services Needed at time of discharge: Y = Yes, Blank = No PT:   OT:   RN:   Equipment:   Other:     LOS: 1 day   Jana Half, MD 08/05/2015, 6:54 AM

## 2015-08-05 NOTE — Progress Notes (Signed)
Patient ID: [name], female   DOB: 12-Jul-1945, 70 y.o.   MRN: [id number]   Given increase LFT's, recommend GI also see in case ERCP is needed.

## 2015-08-05 NOTE — Consult Note (Signed)
[facility] Consult: 12:25 PM 08/05/2015  LOS: 1 day    Referring Provider: Algis Greenhouse, MD  Primary Care Physician:  Larey Dresser, MD Primary Gastroenterologist:  Laurence Spates, MD    Reason for Consultation:  ? Choledocholithiasis.    HPI: [name] is a 70 y.o. female.  Morbidly obese (BMI > 40) with OSA but refuses CPAP.  Depression.  Hx Hep B + (not clear when but longstanding mention in notes)., Hep C Ab negative 07/12/2015.  Hx PE, on Xarelto.  Htn, CKD. Diverticulosis on colonoscopy in 2006.    Awakened at 4AM with epigastirc pain, nausea.  Vomited previously consumed ice cream and then dry heaves.  No improvement with bismuth.  Pain lasted a few hours and eventually resolved at hospital without use of anelgesic other than tylenol.   Elevated transaminases 360/184 but on recheck 248/225.  T bili 2.3 but now to 5.6.  Normal alk phos and Lipase. LFTs were normal on 07/12/15.  ABD ultrasound:  Stone in GB neck, mild GB wall thickening.  Heterogeneous liver but views limited by obesity.  6 cm CBD.  CT abdomen/pelvis: distended GB, possible small gallstone.  Small HH.  Colonic diverticulosis.  Dr Ninfa Linden has seen pt :  "impacted gallstone with cholecystitis.  Lap chole vs cholecystostomy this week".  "Given increase LFT's, recommend GI also see in case ERCP is needed".   Fm Hx negative for GB, liver, colon, gastric disease.  No ETOH or cigs >30 years.  No IV drugs or ilicit drugs ever.   Hep B + discovered many years ago when she tried to donate blood.     Past Medical History  Diagnosis Date  . Depression     followed by Dr. Toy Care  . Diverticulosis of colon   . History of hepatitis B   . Hypertension   . Arthritis     osteoarthritis , kness.  [name] History of pulmonary embolism  01/2002  . Obesity   . Fibrocystic breast disease   . Obstructive sleep apnea     refuses CPAP.  [name] Hyperlipidemia   . Chronic kidney disease     Renal insuffucinecy, cr-1.33 in 10/2005.  [name] Left shoulder pain     2/2 fall  . Neck nodule     not erythematous, movable like cyst located at the base of posterior neck on the left side(not painful), will need follow up for   . Pulmonary nodule     Stable on repeat imaging. LLL nodule 5 mm    Past Surgical History  Procedure Laterality Date  . Gyn surgery      s/p excison of vulvar cyst 10/18/04 by dr. Chrissie NoaLaurance Flatten    Prior to Admission medications   Medication Sig Start Date End Date Taking? Authorizing Provider  ALPRAZolam Duanne Moron) 1 MG tablet Take 1 mg by mouth at bedtime as needed for sleep.    Yes Historical Provider, MD  amLODipine (NORVASC) 10 MG tablet Take 0.5 tablets (5 mg total) by mouth daily. 07/12/15  Yes Bartholomew Crews, MD  buPROPion (WELLBUTRIN) 100 MG tablet Take 100 mg by mouth 3 (three) times daily.   Yes Historical Provider, MD  hydrochlorothiazide (HYDRODIURIL) 25 MG tablet Take 1 tablet (25 mg total) by mouth daily as needed (edema). 07/12/15  Yes Bartholomew Crews, MD  Multiple Vitamin (MULTIVITAMIN) tablet Take 1 tablet by mouth daily. 07/11/13  Yes Jones Bales, MD  potassium chloride (K-DUR) 10 MEQ tablet Take 1 tablet (10 mEq total) by mouth 2 (two) times daily. 07/27/14  Yes Bartholomew Crews, MD  pravastatin (PRAVACHOL) 40 MG tablet TAKE 1 TABLET EVERY DAY 07/19/14  Yes Bartholomew Crews, MD  XARELTO 20 MG TABS tablet TAKE 1 TABLET EVERY DAY WITH SUPPER 12/11/14  Yes Bartholomew Crews, MD  mirabegron ER (MYRBETRIQ) 50 MG TB24 tablet Take 1 tablet (50 mg total) by mouth daily. 07/12/15   Bartholomew Crews, MD  penicillin v potassium (VEETID) 500 MG tablet Take 1 tablet (500 mg total) by mouth 4 (four) times daily. Patient not taking: Reported on 08/04/2015 03/12/15   Montine Circle, PA-C     Scheduled Meds: . albuterol  2.5 mg Nebulization Q6H  . amLODipine  5 mg Oral Daily  . buPROPion  100 mg Oral TID  . mirabegron ER  50 mg Oral Daily  . multivitamin with minerals  1 tablet Oral Daily  . piperacillin-tazobactam (ZOSYN)  IV  3.375 g Intravenous Q8H  . potassium chloride  10 mEq Oral BID  . sodium chloride  1,000 mL Intravenous Once   Infusions: . sodium chloride 125 mL/hr at 08/05/15 0833  . heparin 1,750 Units/hr (08/05/15 1149)   PRN Meds: morphine injection   Allergies as of 08/04/2015 - Review Complete 08/04/2015  Allergen Reaction Noted  . Lisinopril  09/24/2006    Family History  Problem Relation Age of Onset  . Hypertension      family history  . Kidney disease Maternal Uncle     Social History   Social History  . Marital Status: Divorced    Spouse Name: N/A  . Number of Children: N/A  . Years of Education: N/A   Occupational History  . Not on file.   Social History Main Topics  . Smoking status: Former Smoker -- 1.00 packs/day for 20 years    Types: Cigarettes    Quit date: 03/03/1990  . Smokeless tobacco: Not on file  . Alcohol Use: No  . Drug Use: No  . Sexual Activity: Not on file   Other Topics Concern  . Not on file   Social History Narrative   Grew up in [place]. 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 Now back in [place]. Has dog with whom she is close.    REVIEW OF SYSTEMS: Constitutional:  30 # weight increase over several months ENT:  No nose bleeds Pulm:  DOE, not new.  No CPAP.   CV:  No palpitations.  No chest pain.  Chronic LE edema.  GU:  No hematuria, no frequency GI:  Per HPI.  Eats well, poor dietary habits, as she eats her way through depressive sxs Heme:  No unusual bleeding or bruising   Transfusions:  None per her recall Neuro:  No headaches, no peripheral tingling or numbness Derm:  No itching, no rash or sores.   Endocrine:  No sweats or chills.  No polyuria or dysuria Immunization:  Not queried Travel:  None beyond local counties in last few months.  PHYSICAL EXAM: Vital signs in last 24 hours: Filed Vitals:   08/05/15 1021 08/05/15 1124  BP: 121/54 126/58  Pulse:  100  Temp:  99 F (37.2 C)  Resp:  37   Wt Readings from Last 3 Encounters:  08/04/15 174.8 kg (385 lb 5.8 oz)  07/12/15 170.825 kg (376 lb 9.6 oz)  05/28/15 167.377 kg (369 lb)    General: looks obese and not acutely ill Head:  No swelling or asymmetry  Eyes:  No icterus Ears:  Not HOH  Nose:  No discharge Mouth:  Clear, moist MM Neck:  No mass, no JVD Lungs:  Clear bil.  No cough Heart: RRR.  No mrg Abdomen:  Obese, soft, NT.  Active BS.   Rectal: deferred   Musc/Skeltl: no joint contracture or redness Extremities:  + non-pitting pedal edema  Neurologic:  Oriented x 3.  No limb weakness or tremor.  Fully alert.   Skin:  No rash or sores  Psych:  Pleasant, cooperative, talkative.  Not obviously depressed.   Intake/Output from previous day: 06/03 0701 - 06/04 0700 In: 1538.5 [I.V.:1538.5] Out: -  Intake/Output this shift:    LAB RESULTS:  Recent Labs  08/04/15 1441 08/05/15 0652  WBC 13.1* 16.0*  HGB 16.0* 12.8  HCT 48.5* 40.3  PLT 206 168   BMET Lab Results  Component Value Date   NA 138 08/05/2015   NA 135 08/04/2015   NA 142 07/12/2015   K 3.2* 08/05/2015   K 3.5 08/04/2015   K 3.9 07/12/2015   CL 101 08/05/2015   CL 96* 08/04/2015   CL 99 07/12/2015   CO2 27 08/05/2015   CO2 28 08/04/2015   CO2 22 07/12/2015   GLUCOSE 91 08/05/2015   GLUCOSE 204* 08/04/2015   GLUCOSE 115* 07/12/2015   BUN 11 08/05/2015   BUN 13 08/04/2015   BUN 15 07/12/2015   CREATININE 1.17* 08/05/2015   CREATININE 1.15* 08/04/2015   CREATININE 1.02* 07/12/2015   CALCIUM 8.0* 08/05/2015   CALCIUM 9.2 08/04/2015   CALCIUM 9.2 07/12/2015   LFT  Recent Labs  08/04/15 1441 08/05/15 0652  PROT 8.8* 6.1*   ALBUMIN 4.4 2.8*  AST 360* 248*  ALT 184* 225*  ALKPHOS 124 111  BILITOT 2.3* 5.6*   PT/INR Lab Results  Component Value Date   INR 1.21 10/28/2013   INR 1.03 08/10/2009    Lipase     Component Value Date/Time   LIPASE 24 08/04/2015 1441    Drugs of Abuse  No results found for: LABOPIA, COCAINSCRNUR, LABBENZ, AMPHETMU, THCU, LABBARB   RADIOLOGY STUDIES: US Abdomen Complete  08/04/2015  CLINICAL DATA:  70 year old female with diffuse abdominal pain for 1 day. EXAM: ABDOMEN ULTRASOUND COMPLETE COMPARISON:  08/04/2015 CT FINDINGS: Gallbladder: A 1.6 cm nonmobile gallstone is identified at the neck. Mild gallbladder wall thickening is noted. There is no evidence of pericholecystic fluid or definite sonographic Murphy's sign. Common bile duct: Diameter: 6 mm. No definite intrahepatic biliary dilatation. Liver: Heterogeneous echotexture. The liver is difficult to visualize secondary to body habitus. IVC: Not well visualized Pancreas: Not well visualized Spleen: Size and appearance within normal limits. Right Kidney: Length: 10.1 cm. Echogenicity within normal limits. No mass or hydronephrosis visualized. Left Kidney: Length: 17.1 cm. A 9 cm upper pole cyst is again identified. Echogenicity within normal limits. No mass or hydronephrosis visualized. Abdominal aorta: Not well visualized Other findings: No free fluid IMPRESSION: 1.6 cm nonmobile gallstone at the neck with mild  gallbladder wall thickening -equivocal for acute cholecystitis. Liver, IVC, pancreas and abdominal aorta not well visualized secondary to body habitus. Electronically Signed   By: Margarette Canada M.D.   On: 08/04/2015 18:33   Ct Abdomen Pelvis W Contrast  08/04/2015  CLINICAL DATA:  Acute onset vomiting and generalized abdominal pain earlier today. Hepatitis B. Diverticulosis. EXAM: CT ABDOMEN AND PELVIS WITH CONTRAST TECHNIQUE: Multidetector CT imaging of the abdomen and pelvis was performed using the standard protocol following  bolus administration of intravenous contrast. CONTRAST:  168m ISOVUE-300 IOPAMIDOL (ISOVUE-300) INJECTION 61% COMPARISON:  None. FINDINGS: Lower chest: Dependent atelectasis noted in both lung bases. Small hiatal hernia also seen. Hepatobiliary: No liver masses identified. Gallbladder is distended, however there is no evidence of gallbladder wall thickening or pericholecystic inflammatory changes. There is a possible small stone in the dependent portion the gallbladder. No evidence of biliary ductal dilatation. Pancreas: No mass, inflammatory changes, or other significant abnormality. Spleen: Within normal limits in size and appearance. Adrenals/Urinary Tract: No masses identified. No evidence of hydronephrosis. Large simple left renal cyst seen measuring 9 cm. Stomach/Bowel: No evidence of obstruction, inflammatory process, or abnormal fluid collections. Diverticulosis is seen involving the descending sigmoid colon, however there is no evidence of diverticulitis. Normal appendix visualized. Vascular/Lymphatic: No pathologically enlarged lymph nodes. No evidence of abdominal aortic aneurysm. Reproductive: No mass or other significant abnormality. Other: None. Musculoskeletal:  No suspicious bone lesions identified. IMPRESSION: Distended gallbladder, with possible small gallstone. No radiographic evidence of acute cholecystitis or biliary dilatation. Recommend clinical correlation, and consider abdominal ultrasound for further evaluation if warranted. Colonic diverticulosis. No radiographic evidence of diverticulitis. Small hiatal hernia. Dependent atelectasis in both lung bases. Electronically Signed   By: JEarle GellM.D.   On: 08/04/2015 16:59   Dg Abd Acute W/chest  08/04/2015  CLINICAL DATA:  Shortness of breath with abdominal pain and vomiting EXAM: DG ABDOMEN ACUTE W/ 1V CHEST COMPARISON:  Chest radiograph October 26, 2013 FINDINGS: PA chest: There is cardiomegaly with pulmonary venous hypertension. No edema  or consolidation. No adenopathy. Supine and left lateral decubitus abdomen images: There is fairly diffuse stool throughout the colon. There is no appreciable bowel dilatation or air-fluid level. No free air. No abnormal calcifications. IMPRESSION: Evidence of pulmonary vascular congestion without frank edema or consolidation. There is fairly diffuse stool throughout colon. No bowel obstruction or free air evident. Electronically Signed   By: WLowella GripIII M.D.   On: 08/04/2015 15:39    ENDOSCOPIC STUDIES: 2006 colonoscopy: Dr. EOletta Lamas Excellent prep, to cecum, diverticulosis per notes.  Not finding original procedure report but reference made to this in 2011 GI office note and in "gastro" file.   IMPRESSION:   *  Biliary colic, ? Cholecystitis.  Acute pain and n/v resolved.  Though CBD just 646mon ultrasound, LFTs are rising creating suspicion for stones in CBD. MRCP ordered, hopefully will clarify this question.  *  ? Hep B +.  Do not see the tests that confirm this but do see she is Hep C Ab negative.    PLAN:     *  Await MRCP  *  I finished this consult note I was finally able to locate an office note from 2011 by Dr EdOletta Lamasf EaSadie HaberI.  This note dated 09/06/2009 per Dr MaDyann Kiefcanned Dr EdOletta Lamasffice visit note into files. Pt now recalls seeing Dr EdOletta Lamass an outpatient. Dr StFuller Planill contact [facility] to see if they wish to take  over GI care.    Azucena Freed  08/05/2015, 12:25 PM Pager: [phone]     Attending physician's note   I have taken a history, examined the patient and reviewed the chart. I agree with the Advanced Practitioner's note, impression and recommendations.  Cholelithiasis and cholecystitis. Abnormal LFTs. R/O choledocholithiasis although without biliary dilation (CBD=28m) LFT elevation could be secondary to cholecystitis. MRCP has been ordered to further evaluate. Continue IV antibiotics. Continue to hold Xarelto. General Surgery is following with  plans for cholecystectomy vs cholecystostomy this week. Will contact Dr[facility] SMichail [facility]who [facility].    MLucio Edward MD FMarval [phone]-Fri 8a-5p [phone] 5p, weekends, holidays

## 2015-08-05 NOTE — Progress Notes (Signed)
Patient ID: Barrie Dunkerelestine T Tullis, female   DOB: 06-12-1945, 70 y.o.   MRN: [id number]  Dr. Beckie Saltsivet and I visited Ms. Soderberg around 8pm. Last night, she was given 6L of NS boluses for presumptive sepsis and started on maintenance IV fluids throughout the day. This afternoon she became acutely hypoxic, requiring 15L O2 via Venturi mask. Dr. Kathe Marinerive gave her 60mg  IV lasix around 6pm.  When we saw her, she felt her breathing was improving dramatically since getting IV lasix a few hours prior to our arrival. She was speaking in full sentences and was not dyspneic. Notably, in 2015, she had a massive pulmonary embolus and has been on rivaroxaban since then. She's been on therapeutic IV heparin since her admission. She was oxygenating 96% on 12L Venturi mask, blood pressure 150/60, pulse 100. It's nearly impossible to obtain information from her lung exam due to her morbid obesity but she sounded clear anteriorly.  All in all, I suspect her hypoxia is primarily driven by iatrogenic hypervolemia. Should she continue to be have such an impressive oxygen require despite IV diuresis, however, I think we should get another chest x-ray, give her another dose of IV lasix, and consider getting a CTA chest to rule out pulmonary embolus, particularly given the rapidity of her decompensation.

## 2015-08-06 ENCOUNTER — Telehealth: Payer: Self-pay | Admitting: Licensed Clinical Social Worker

## 2015-08-06 ENCOUNTER — Encounter: Payer: Self-pay | Admitting: *Deleted

## 2015-08-06 ENCOUNTER — Inpatient Hospital Stay (HOSPITAL_COMMUNITY): Payer: Commercial Managed Care - HMO

## 2015-08-06 ENCOUNTER — Other Ambulatory Visit: Payer: Self-pay | Admitting: *Deleted

## 2015-08-06 DIAGNOSIS — J9601 Acute respiratory failure with hypoxia: Secondary | ICD-10-CM | POA: Diagnosis not present

## 2015-08-06 DIAGNOSIS — B9620 Unspecified Escherichia coli [E. coli] as the cause of diseases classified elsewhere: Secondary | ICD-10-CM

## 2015-08-06 DIAGNOSIS — R0609 Other forms of dyspnea: Secondary | ICD-10-CM | POA: Diagnosis not present

## 2015-08-06 DIAGNOSIS — E662 Morbid (severe) obesity with alveolar hypoventilation: Secondary | ICD-10-CM

## 2015-08-06 DIAGNOSIS — J9611 Chronic respiratory failure with hypoxia: Secondary | ICD-10-CM | POA: Diagnosis not present

## 2015-08-06 DIAGNOSIS — E8779 Other fluid overload: Secondary | ICD-10-CM

## 2015-08-06 DIAGNOSIS — R0600 Dyspnea, unspecified: Secondary | ICD-10-CM | POA: Diagnosis not present

## 2015-08-06 LAB — CBC WITH DIFFERENTIAL/PLATELET
BASOS ABS: 0 10*3/uL (ref 0.0–0.1)
Basophils Relative: 0 %
EOS PCT: 0 %
Eosinophils Absolute: 0 10*3/uL (ref 0.0–0.7)
HEMATOCRIT: 41.1 % (ref 36.0–46.0)
Hemoglobin: 13 g/dL (ref 12.0–15.0)
LYMPHS ABS: 1.2 10*3/uL (ref 0.7–4.0)
LYMPHS PCT: 9 %
MCH: 25.7 pg — AB (ref 26.0–34.0)
MCHC: 31.6 g/dL (ref 30.0–36.0)
MCV: 81.2 fL (ref 78.0–100.0)
Monocytes Absolute: 0.5 10*3/uL (ref 0.1–1.0)
Monocytes Relative: 4 %
NEUTROS ABS: 11.9 10*3/uL — AB (ref 1.7–7.7)
Neutrophils Relative %: 87 %
PLATELETS: 168 10*3/uL (ref 150–400)
RBC: 5.06 MIL/uL (ref 3.87–5.11)
RDW: 15 % (ref 11.5–15.5)
WBC: 13.6 10*3/uL — AB (ref 4.0–10.5)

## 2015-08-06 LAB — APTT
aPTT: 68 seconds — ABNORMAL HIGH (ref 24–37)
aPTT: 69 seconds — ABNORMAL HIGH (ref 24–37)

## 2015-08-06 LAB — COMPREHENSIVE METABOLIC PANEL
ALBUMIN: 3 g/dL — AB (ref 3.5–5.0)
ALT: 152 U/L — ABNORMAL HIGH (ref 14–54)
ANION GAP: 11 (ref 5–15)
AST: 134 U/L — AB (ref 15–41)
Alkaline Phosphatase: 142 U/L — ABNORMAL HIGH (ref 38–126)
BUN: 9 mg/dL (ref 6–20)
CHLORIDE: 101 mmol/L (ref 101–111)
CO2: 29 mmol/L (ref 22–32)
Calcium: 8.1 mg/dL — ABNORMAL LOW (ref 8.9–10.3)
Creatinine, Ser: 1.26 mg/dL — ABNORMAL HIGH (ref 0.44–1.00)
GFR calc Af Amer: 50 mL/min — ABNORMAL LOW (ref >60)
GFR, EST NON AFRICAN AMERICAN: 43 mL/min — AB (ref >60)
GLUCOSE: 93 mg/dL (ref 65–99)
POTASSIUM: 3 mmol/L — AB (ref 3.5–5.1)
Sodium: 141 mmol/L (ref 135–145)
Total Bilirubin: 6.8 mg/dL — ABNORMAL HIGH (ref 0.3–1.2)
Total Protein: 7.1 g/dL (ref 6.5–8.1)

## 2015-08-06 LAB — URINE CULTURE: Culture: >=100000 — AB

## 2015-08-06 LAB — LIPASE, BLOOD: LIPASE: 14 U/L (ref 11–51)

## 2015-08-06 LAB — HEPARIN LEVEL (UNFRACTIONATED): HEPARIN UNFRACTIONATED: 0.42 [IU]/mL (ref 0.30–0.70)

## 2015-08-06 MED ORDER — FUROSEMIDE 10 MG/ML IJ SOLN
60.0000 mg | Freq: Once | INTRAMUSCULAR | Status: AC
  Administered 2015-08-06: 60 mg via INTRAVENOUS
  Filled 2015-08-06: qty 6

## 2015-08-06 MED ORDER — TECHNETIUM TO 99M ALBUMIN AGGREGATED
4.4000 | Freq: Once | INTRAVENOUS | Status: AC | PRN
  Administered 2015-08-06: 4 via INTRAVENOUS

## 2015-08-06 MED ORDER — ACETAMINOPHEN 325 MG PO TABS
650.0000 mg | ORAL_TABLET | Freq: Four times a day (QID) | ORAL | Status: DC | PRN
  Administered 2015-08-06: 650 mg via ORAL
  Filled 2015-08-06: qty 2

## 2015-08-06 MED ORDER — POTASSIUM CHLORIDE CRYS ER 20 MEQ PO TBCR
40.0000 meq | EXTENDED_RELEASE_TABLET | Freq: Two times a day (BID) | ORAL | Status: AC
  Administered 2015-08-06: 40 meq via ORAL
  Filled 2015-08-06: qty 2

## 2015-08-06 MED ORDER — TECHNETIUM TC 99M DIETHYLENETRIAME-PENTAACETIC ACID: 30.9000 | Freq: Once | INTRAVENOUS | Status: DC | PRN

## 2015-08-06 NOTE — Progress Notes (Signed)
Subjective: Breathing issue are her main problem this AM.  [facility]. Not much pain.  I spent some time trying to explain what was going on with her gallbladder, the reason for her MRI and how we approach things.    Objective: Vital signs in last 24 hours: Temp:  [97.6 F (36.4 C)-100.6 F (38.1 C)] 98.9 F (37.2 C) (06/05 0714) Pulse Rate:  [94-112] 94 (06/05 0714) Resp:  [25-41] 25 (06/05 0714) BP: (121-149)/(54-84) 140/64 mmHg (06/05 0714) SpO2:  [86 %-94 %] 91 % (06/05 0714) FiO2 (%):  [40 %-55 %] 50 % (06/05 0714) Weight:  [172.3 kg (379 lb 13.6 oz)] 172.3 kg (379 lb 13.6 oz) (06/05 0352) Last BM Date: 08/04/15 870 PO yesterday recorded TM 100.6 yesterday, VSS K+ 3.0 Issues with hypoxia, PO 56 on 50% FM Creatinine is still up, bilirubin is also rising abd US 08/04/15 Shows gallstone impacted in neck of GB, with some thickening of the GB wall MRI pending Intake/Output from previous day: 06/04 0701 - 06/05 0700 In: 2767 [P.O.:870; I.V.:1797; IV Piggyback:100] Out: -  Intake/Output this shift:    General appearance: alert, cooperative, no distress and 50% facemask GI: soft sore, no complaint of pain this AM.  Lab Results:   Recent Labs  08/05/15 0652 08/06/15 0403  WBC 16.0* 13.6*  HGB 12.8 13.0  HCT 40.3 41.1  PLT 168 168    BMET  Recent Labs  08/05/15 0652 08/06/15 0403  NA 138 141  K 3.2* 3.0*  CL 101 101  CO2 27 29  GLUCOSE 91 93  BUN 11 9  CREATININE 1.17* 1.26*  CALCIUM 8.0* 8.1*   PT/INR No results for input(s): LABPROT, INR in the last 72 hours.   Recent Labs Lab 08/04/15 1441 08/05/15 0652 08/06/15 0403  AST 360* 248* 134*  ALT 184* 225* 152*  ALKPHOS 124 111 142*  BILITOT 2.3* 5.6* 6.8*  PROT 8.8* 6.1* 7.1  ALBUMIN 4.4 2.8* 3.0*     Lipase     Component Value Date/Time   LIPASE 24 08/04/2015 1441     Studies/Results: [place] Abdomen Complete  08/04/2015  CLINICAL DATA:   70 year old female with diffuse abdominal pain for 1 day. EXAM: ABDOMEN ULTRASOUND COMPLETE COMPARISON:  08/04/2015 CT FINDINGS: Gallbladder: A 1.6 cm nonmobile gallstone is identified at the neck. Mild gallbladder wall thickening is noted. There is no evidence of pericholecystic fluid or definite sonographic Murphy's sign. Common bile duct: Diameter: 6 mm. No definite intrahepatic biliary dilatation. Liver: Heterogeneous echotexture. The liver is difficult to visualize secondary to body habitus. IVC: Not well visualized Pancreas: Not well visualized Spleen: Size and appearance within normal limits. Right Kidney: Length: 10.1 cm. Echogenicity within normal limits. No mass or hydronephrosis visualized. Left Kidney: Length: 17.1 cm. A 9 cm upper pole cyst is again identified. Echogenicity within normal limits. No mass or hydronephrosis visualized. Abdominal aorta: Not well visualized Other findings: No free fluid IMPRESSION: 1.6 cm nonmobile gallstone at the neck with mild gallbladder wall thickening -equivocal for acute cholecystitis. Liver, IVC, pancreas and abdominal aorta not well visualized secondary to body habitus. Electronically Signed   By: Harmon PierJeffrey  Hu M.D.   On: 08/04/2015 18:33   Ct Abdomen Pelvis W Contrast  08/04/2015  CLINICAL DATA:  Acute onset vomiting and generalized abdominal pain earlier today. Hepatitis B. Diverticulosis. EXAM: CT ABDOMEN AND PELVIS WITH CONTRAST TECHNIQUE: Multidetector CT imaging of the abdomen and pelvis was performed using  the standard protocol following bolus administration of intravenous contrast. CONTRAST:  ISOVUE-300 IOPAMIDOL (ISOVUE-300) INJECTION 61% COMPARISON:  None. FINDINGS: Lower chest: Dependent atelectasis noted in both lung bases. Small hiatal hernia also seen. Hepatobiliary: No liver masses identified. Gallbladder is distended, however there is no evidence of gallbladder wall thickening or pericholecystic inflammatory changes. There is a possible small  stone in the dependent portion the gallbladder. No evidence of biliary ductal dilatation. Pancreas: No mass, inflammatory changes, or other significant abnormality. Spleen: Within normal limits in size and appearance. Adrenals/Urinary Tract: No masses identified. No evidence of hydronephrosis. Large simple left renal cyst seen measuring 9 cm. Stomach/Bowel: No evidence of obstruction, inflammatory process, or abnormal fluid collections. Diverticulosis is seen involving the descending sigmoid colon, however there is no evidence of diverticulitis. Normal appendix visualized. Vascular/Lymphatic: No pathologically enlarged lymph nodes. No evidence of abdominal aortic aneurysm. Reproductive: No mass or other significant abnormality. Other: None. Musculoskeletal:  No suspicious bone lesions identified. IMPRESSION: Distended gallbladder, with possible small gallstone. No radiographic evidence of acute cholecystitis or biliary dilatation. Recommend clinical correlation, and consider abdominal ultrasound for further evaluation if warranted. Colonic diverticulosis. No radiographic evidence of diverticulitis. Small hiatal hernia. Dependent atelectasis in both lung bases. Electronically Signed   By: Myles Rosenthal M.D.   On: 08/04/2015 16:59   Dg Abd Acute W/chest  08/04/2015  CLINICAL DATA:  Shortness of breath with abdominal pain and vomiting EXAM: DG ABDOMEN ACUTE W/ 1V CHEST COMPARISON:  Chest radiograph October 26, 2013 FINDINGS: PA chest: There is cardiomegaly with pulmonary venous hypertension. No edema or consolidation. No adenopathy. Supine and left lateral decubitus abdomen images: There is fairly diffuse stool throughout the colon. There is no appreciable bowel dilatation or air-fluid level. No free air. No abnormal calcifications. IMPRESSION: Evidence of pulmonary vascular congestion without frank edema or consolidation. There is fairly diffuse stool throughout colon. No bowel obstruction or free air evident.  Electronically Signed   By: Bretta Bang III M.D.   On: 08/04/2015 15:39    Medications: . amLODipine  5 mg Oral Daily  . buPROPion  100 mg Oral TID  . mirabegron ER  50 mg Oral Daily  . multivitamin with minerals  1 tablet Oral Daily  . piperacillin-tazobactam (ZOSYN)  IV  3.375 g Intravenous Q8H  . potassium chloride  40 mEq Oral BID  . sodium chloride  1,000 mL Intravenous Once   . heparin 1,900 Units/hr (08/06/15 0540)    Assessment/Plan Acute cholecystitis/cholelithiasis Elevated LFT's Morbid obesity Body mass index is 59.43  Pulmonary Emboli on Xarelto (last dose 08/13/15 @ 12:30 PM) OSA on CPAP Chronic renal insuffiencey Hepatitis B Limited mobility uses a walker  Depression Arthritis  Hypertension FEN:  NPO/ ID:  Day 3 Zosyn, one dose of vancomycin on admit VTE: Heparin drip    Plan:  Await MRI, GI evaluation and Medicine is working on Risk analyst.       LOS: 2 days    Jolinda Pinkstaff 08/06/2015 [id number]

## 2015-08-06 NOTE — Progress Notes (Signed)
Patient ID: [name], female   DOB: 10-14-1945, 70 y.o.   MRN: [id number] Medicine attending: I examined this patient together with resident physician Dr. Venia MinksNicholas Taylor and I concur with his evaluation and management plan which we discussed together. 70 year old woman with acute cholecystitis/cholangitis with initial fever 103.9 rectal. Currently on Zosyn and temp trend down. Fluctuating white blood count currently 13,600. Improvement in transaminase enzymes compared with admission values but progressive rise in bilirubin currently 6.8. Over the last 24 hours she has developed increasing respiratory distress and hypoxia. Of note she is on therapeutic unfractionated heparin. She was on Xarelto prior to admission in view of  massive bilateral pulmonary emboli sustained in August 2015. She did receive a large volume of saline, 6 L, as part of the sepsis protocol in the emergency department. Chest x-ray shows diffuse interstitial edema. No pleural effusion. No cardiomegaly. No prior cardiac history. She does have obesity sleep apnea syndrome. She had a normal EKG on admission. An echocardiogram done November 2015 with left ventricular ejection fraction 55-60 percent. On exam: Blood pressure 133/71, pulse 97, temperature 98.4 F (36.9 C), temperature source Oral, resp. rate 33, height 5\' 7"  (1.702 m), weight 379 lb 13.6 oz (172.3 kg), SpO2 93 %. On 50% FiO2 Dyspneic at rest on oxygen. JVD hard to interpret due to short neck. Rales one third up bilaterally over the lungs. Regular cardiac rhythm, rapid rate, no appreciable gallop. Right calf is tender but supple. Not swollen.  Additional Pertinent lab:  PTT therapeutic at 69 seconds, hemoglobin 13 BUN 9, creatinine 1.3  Impression: Likely fluid overload although she is having a slow response to diuresis and does not have known underlying cardiac disease. I'm concerned with the possibility that she might still be at risk for pulmonary embolus  given her history and despite therapeutic heparin. Since we have been giving her parenteral diuresis and her creatinine is slowly rising, I think the safest test to get at this time would be a nuclear medicine ventilation perfusion scan. Once her respiratory status is stable and better defined, she will be reevaluated for cholecystectomy.

## 2015-08-06 NOTE — Progress Notes (Signed)
Pt kept Cpap on about an hour and then took it c/o its not "smooth" RT explained to pt multiple times how device works and need for device, pt still refused placed back on 40% venni mask @ 10 liters. Pt adamant about getting xanax spoke with Dr Wendie AgresteFloreiz x 2 No new orders given

## 2015-08-06 NOTE — Progress Notes (Signed)
Patient ID: [name], female   DOB: [birth year], 70 y.o.   MRN: [id number]  The day team asked us to evaluate [name] at the beginning of our night shift.  When we walked in the room, she was speaking to the Chaplain, without any increased work of breathing, speaking in complete sentences to us. She was saturating 90% on 8L Venturi mask at 50% FiO2. Her lung exam was again difficult to discern any information from due to her obesity. We notified her that her V/Q scan had ruled out a pulmonary embolus and she was relieved by this news.  At this point, I think her hypoxia is primarily driven by iatrogenic hypervolemia; she put out 2.5L today but but is still net-positive about 2L, and her chest x-ray continues to show interstitial edema. A pulmonary embolus was ruled out with a very low probability V/Q scan. We will diurese her with another dose of furosemide 60mg  IV once. Her creatinine continue to rise slowly; I wonder if this is related to her nearing hypovolemia or perhaps progression of her systemic infection. If she does not improve with diuresis and her creatinine rises suggesting hypovolemia, I think we should consider getting an echocardiogram. In the event her heart is working well, I think that early ARDS should be added to the differential given her rising hyperbilirubinemia in the setting of suspected choledocholithiasis versus cholangitis.

## 2015-08-06 NOTE — Progress Notes (Signed)
ANTICOAGULATION CONSULT NOTE - Follow Up Consult  Pharmacy Consult for heparin Indication: h/o PE  Allergies  Allergen Reactions  . Lisinopril     REACTION: PT with cough    Patient Measurements: Height: 5\' 7"  (170.2 cm) Weight: (!) 379 lb 13.6 oz (172.3 kg) IBW/kg (Calculated) : 61.6 Heparin Dosing Weight: 105kg  Vital Signs: Temp: 98.4 F (36.9 C) (06/05 1131) Temp Source: Oral (06/05 1131) BP: 133/71 mmHg (06/05 1131) Pulse Rate: 97 (06/05 1131)  Labs:  Recent Labs  08/04/15 1441 08/04/15 1452  08/05/15 0652 08/05/15 1749 08/06/15 0403 08/06/15 1031  HGB 16.0*  --   --  12.8  --  13.0  --   HCT 48.5*  --   --  40.3  --  41.1  --   PLT 206  --   --  168  --  168  --   APTT  --   --   < > 59* 53* 68* 69*  HEPARINUNFRC  --   --   --  0.66  --  0.42  --   CREATININE 1.15*  --   --  1.17*  --  1.26*  --   TROPONINI  --  <0.03  --   --   --   --   --   < > = values in this interval not displayed.  Estimated Creatinine Clearance: 71.4 mL/min (by C-G formula based on Cr of 1.26).   Scheduled:  . amLODipine  5 mg Oral Daily  . buPROPion  100 mg Oral TID  . mirabegron ER  50 mg Oral Daily  . multivitamin with minerals  1 tablet Oral Daily  . piperacillin-tazobactam (ZOSYN)  IV  3.375 g Intravenous Q8H  . sodium chloride  1,000 mL Intravenous Once   Infusions:  . heparin 1,900 Units/hr (08/06/15 0859)    Assessment: 70yo female c/o abdominal pain and emesis, admitted w/ probable cholecystitis, to be evaluated by surgeon, to begin heparin bridge while Xarelto on hold for h/o PE; last dose of Xarelto taken 6/2 at 1230. -aPTT=68, 69. Therapeutic x2 with heparin 1900 units/hr  Goal of Therapy:  Heparin level 0.3-0.7 units/ml aPTT 66-102 seconds Monitor platelets by anticoagulation protocol: Yes   Plan:   Continue heparin 1900 units/hr Daily HL and APTT, CBC   Thank you for allowing us to participate in this patients care. Signe Coltonya C Raeley Gilmore, PharmD Pager:  [phone] 08/06/2015 11:45 AM

## 2015-08-06 NOTE — Care Management Important Message (Signed)
Important Message  Patient Details  Name: Barrie DunkerCelestine T Georgiou MRN: [id number] Date of Birth: 04/27/1945   Medicare Important Message Given:  Yes    [name], Oziah Vitanza [name] 08/06/2015, 12:03 PM

## 2015-08-06 NOTE — Progress Notes (Signed)
Spoke with with Dr. Vassie LollFlorez that pt's oxygen needs are still 50% @ 12 liter MD said team would round.

## 2015-08-06 NOTE — Progress Notes (Signed)
Patient shared that it feels like she is having pulmonary emboli. Patient stated that she has had PEs previously and it felt similar. Patient also shared that she is concerned she might not survive. Pt has asked for living will. Paged Dr. Allena KatzPatel and Chaplain services.

## 2015-08-06 NOTE — Telephone Encounter (Signed)
Ambulatory Social Work note: CSW received call from Ms. Pettis.  This phone call was from pt's mobile phone and reception was not at it's best.  Pt states she is currently admitted at [facility] and will potentially need surgery.  However, pt's main concern is her current case with [facility] and wanting to appeal.  Pt states [facility] Ms. Selena Batten said the decision was final.  Pt would like to contact Ms. Pearline Cables (supervisor) to obtain information to appeal.  Ms. Purkey states she met with [facility] CSW on 08/03/15 and would like to add [facility] CSW assessment to her reasonable accommodation request.  CSW informed Ms. Pauli this worker had been informed by [facility] that they are unable to accept attachments and all information had to be on the specific Reasonable Accommodation form.  Pt states "that is a disservice to me."  CSW encouraged Ms. Barman to concentrate on her medical health at this time and this worker will coordinate with [facility] CSW to provide add'l information for Reasonable Accommodation and obtain add'l information as to how to appeal.   In addition, this worker will work with [facility] CSW to prevent duplication of services.

## 2015-08-06 NOTE — Telephone Encounter (Signed)
CS[facility], message left for Ms. Hardy-Orr, [facility] most [facility].  CSW left message inquiring if pt's reasonable accomodation request decision was final, if so could it be appealed.  Ms. Henriette CombsHardy-Orr is out of the office today, will await return call.  CSW will request [facility] to fax new request form for reasonable accommodation if possible.

## 2015-08-06 NOTE — Progress Notes (Signed)
Subjective: Yesterday afternoon, patient became increasingly SOB requiring increased [place] O2 requirement.  Later in the evening, she will still SOB requiring Venti mask.  She was given Lasix 60 mg IV once and patient reported improvement in her breathing.  This morning, she still complains of feeling "kind of bad," but is unable to provide further details. She reports her breathing is overall unchanged from yesterday.  She denies orthopnea or cough.   Objective: Vital signs in last 24 hours: Filed Vitals:   08/06/15 0017 08/06/15 0352 08/06/15 0646 08/06/15 0714  BP: 145/71 138/84  140/64  Pulse: 100 103 96 94  Temp: 100 F (37.8 C) 100.6 F (38.1 C)  98.9 F (37.2 C)  TempSrc: Oral Oral  Oral  Resp: 34 40 28 25  Height:  5\' 7"  (1.702 m)    Weight:  379 lb 13.6 oz (172.3 kg)    SpO2: 86% 89% 91% 91%   Weight change: 26 lb 13.6 oz (12.18 kg)  Intake/Output Summary (Last 24 hours) at 08/06/15 0837 Last data filed at 08/06/15 0540  Gross per 24 hour  Intake   2767 ml  Output      0 ml  Net   2767 ml   Physical Exam  Constitutional: She is oriented to person, place, and time.  Morbidly obese female, lying in bed, NAD.  HENT:  Head: Normocephalic and atraumatic.  Eyes: EOM are normal. No scleral icterus.  Cardiovascular: Regular rhythm and normal heart sounds.   Tachycardic. Heart sounds distant, but no murmurs or gallops appreciated.  Pulmonary/Chest: Effort normal. No stridor. No respiratory distress. She has no rales.  Crackles appreciated in bilateral bases.  No wheezes.  Abdominal: Soft. She exhibits no distension. There is no rebound and no guarding.  Minimally TTP in RUQ and epigastrum.  Musculoskeletal:  Trace pitting edema to midshin.  Neurological: She is alert and oriented to person, place, and time.  Skin: Skin is warm and dry.    Lab Results: Basic Metabolic Panel:  Recent Labs Lab 08/05/15 0652 08/06/15 0403  NA 138 141  K 3.2* 3.0*  CL 101 101  CO2  27 29  GLUCOSE 91 93  BUN 11 9  CREATININE 1.17* 1.26*  CALCIUM 8.0* 8.1*   Liver Function Tests:  Recent Labs Lab 08/05/15 0652 08/06/15 0403  AST 248* 134*  ALT 225* 152*  ALKPHOS 111 142*  BILITOT 5.6* 6.8*  PROT 6.1* 7.1  ALBUMIN 2.8* 3.0*    Recent Labs Lab 08/04/15 1441  LIPASE 24   No results for input(s): AMMONIA in the last 168 hours. CBC:  Recent Labs Lab 08/04/15 1441 08/05/15 0652 08/06/15 0403  WBC 13.1* 16.0* 13.6*  NEUTROABS 12.4*  --  11.9*  HGB 16.0* 12.8 13.0  HCT 48.5* 40.3 41.1  MCV 81.6 81.1 81.2  PLT 206 168 168   Cardiac Enzymes:  Recent Labs Lab 08/04/15 1452  TROPONINI <0.03   BNP: No results for input(s): PROBNP in the last 168 hours. D-Dimer: No results for input(s): DDIMER in the last 168 hours. CBG: No results for input(s): GLUCAP in the last 168 hours. Hemoglobin A1C: No results for input(s): HGBA1C in the last 168 hours. Fasting Lipid Panel: No results for input(s): CHOL, HDL, LDLCALC, TRIG, CHOLHDL, LDLDIRECT in the last 168 hours. Thyroid Function Tests: No results for input(s): TSH, T4TOTAL, FREET4, T3FREE, THYROIDAB in the last 168 hours. Coagulation: No results for input(s): LABPROT, INR in the last 168 hours. Anemia Panel: No  results for input(s): VITAMINB12, FOLATE, FERRITIN, TIBC, IRON, RETICCTPCT in the last 168 hours. Urine Drug Screen: Drugs of Abuse  No results found for: LABOPIA, COCAINSCRNUR, LABBENZ, AMPHETMU, THCU, LABBARB  Alcohol Level: No results for input(s): ETH in the last 168 hours. Urinalysis:  Recent Labs Lab 08/04/15 1700  COLORURINE AMBER*  LABSPEC 1.042*  PHURINE 6.0  GLUCOSEU NEGATIVE  HGBUR NEGATIVE  BILIRUBINUR SMALL*  KETONESUR NEGATIVE  PROTEINUR 30*  NITRITE POSITIVE*  LEUKOCYTESUR NEGATIVE   Misc. Labs:   Micro Results: Recent Results (from the past 240 hour(s))  Urine culture     Status: Abnormal (Preliminary result)   Collection Time: 08/04/15  5:00 PM  Result  Value Ref Range Status   Specimen Description URINE, CATHETERIZED  Final   Special Requests NONE  Final   Culture >=100,000 COLONIES/mL ESCHERICHIA COLI (A)  Final   Report Status PENDING  Incomplete  MRSA PCR Screening     Status: None   Collection Time: 08/04/15 10:52 PM  Result Value Ref Range Status   MRSA by PCR NEGATIVE NEGATIVE Final    Comment:        The GeneXpert MRSA Assay (FDA approved for NASAL specimens only), is one component of a comprehensive MRSA colonization surveillance program. It is not intended to diagnose MRSA infection nor to guide or monitor treatment for MRSA infections.    Studies/Results: [place] Abdomen Complete  08/04/2015  CLINICAL DATA:  70 year old female with diffuse abdominal pain for 1 day. EXAM: ABDOMEN ULTRASOUND COMPLETE COMPARISON:  08/04/2015 CT FINDINGS: Gallbladder: A 1.6 cm nonmobile gallstone is identified at the neck. Mild gallbladder wall thickening is noted. There is no evidence of pericholecystic fluid or definite sonographic Murphy's sign. Common bile duct: Diameter: 6 mm. No definite intrahepatic biliary dilatation. Liver: Heterogeneous echotexture. The liver is difficult to visualize secondary to body habitus. IVC: Not well visualized Pancreas: Not well visualized Spleen: Size and appearance within normal limits. Right Kidney: Length: 10.1 cm. Echogenicity within normal limits. No mass or hydronephrosis visualized. Left Kidney: Length: 17.1 cm. A 9 cm upper pole cyst is again identified. Echogenicity within normal limits. No mass or hydronephrosis visualized. Abdominal aorta: Not well visualized Other findings: No free fluid IMPRESSION: 1.6 cm nonmobile gallstone at the neck with mild gallbladder wall thickening -equivocal for acute cholecystitis. Liver, IVC, pancreas and abdominal aorta not well visualized secondary to body habitus. Electronically Signed   By: Harmon PierJeffrey  Hu M.D.   On: 08/04/2015 18:33   Ct Abdomen Pelvis W Contrast  08/04/2015   CLINICAL DATA:  Acute onset vomiting and generalized abdominal pain earlier today. Hepatitis B. Diverticulosis. EXAM: CT ABDOMEN AND PELVIS WITH CONTRAST TECHNIQUE: Multidetector CT imaging of the abdomen and pelvis was performed using the standard protocol following bolus administration of intravenous contrast. CONTRAST:  100mL ISOVUE-300 IOPAMIDOL (ISOVUE-300) INJECTION 61% COMPARISON:  None. FINDINGS: Lower chest: Dependent atelectasis noted in both lung bases. Small hiatal hernia also seen. Hepatobiliary: No liver masses identified. Gallbladder is distended, however there is no evidence of gallbladder wall thickening or pericholecystic inflammatory changes. There is a possible small stone in the dependent portion the gallbladder. No evidence of biliary ductal dilatation. Pancreas: No mass, inflammatory changes, or other significant abnormality. Spleen: Within normal limits in size and appearance. Adrenals/Urinary Tract: No masses identified. No evidence of hydronephrosis. Large simple left renal cyst seen measuring 9 cm. Stomach/Bowel: No evidence of obstruction, inflammatory process, or abnormal fluid collections. Diverticulosis is seen involving the descending sigmoid colon, however there is no  evidence of diverticulitis. Normal appendix visualized. Vascular/Lymphatic: No pathologically enlarged lymph nodes. No evidence of abdominal aortic aneurysm. Reproductive: No mass or other significant abnormality. Other: None. Musculoskeletal:  No suspicious bone lesions identified. IMPRESSION: Distended gallbladder, with possible small gallstone. No radiographic evidence of acute cholecystitis or biliary dilatation. Recommend clinical correlation, and consider abdominal ultrasound for further evaluation if warranted. Colonic diverticulosis. No radiographic evidence of diverticulitis. Small hiatal hernia. Dependent atelectasis in both lung bases. Electronically Signed   By: Myles Rosenthal M.D.   On: 08/04/2015 16:59   Dg  Abd Acute W/chest  08/04/2015  CLINICAL DATA:  Shortness of breath with abdominal pain and vomiting EXAM: DG ABDOMEN ACUTE W/ 1V CHEST COMPARISON:  Chest radiograph October 26, 2013 FINDINGS: PA chest: There is cardiomegaly with pulmonary venous hypertension. No edema or consolidation. No adenopathy. Supine and left lateral decubitus abdomen images: There is fairly diffuse stool throughout the colon. There is no appreciable bowel dilatation or air-fluid level. No free air. No abnormal calcifications. IMPRESSION: Evidence of pulmonary vascular congestion without frank edema or consolidation. There is fairly diffuse stool throughout colon. No bowel obstruction or free air evident. Electronically Signed   By: Bretta Bang III M.D.   On: 08/04/2015 15:39   Medications: I have reviewed the patient's current medications. Scheduled Meds: . amLODipine  5 mg Oral Daily  . buPROPion  100 mg Oral TID  . furosemide  60 mg Intravenous Once  . mirabegron ER  50 mg Oral Daily  . multivitamin with minerals  1 tablet Oral Daily  . piperacillin-tazobactam (ZOSYN)  IV  3.375 g Intravenous Q8H  . potassium chloride  40 mEq Oral BID  . sodium chloride  1,000 mL Intravenous Once   Continuous Infusions: . heparin 1,900 Units/hr (08/06/15 0540)   PRN Meds:.acetaminophen, ipratropium-albuterol, morphine injection Assessment/Plan: Principal Problem:   Cholecystitis, acute Active Problems:   Hyperlipidemia   Severe obesity (BMI >= 40) (HCC)   Major depression, chronic (HCC)   OSA (obstructive sleep apnea)   HTN (hypertension)   GERD   OA (osteoarthritis) of knee   PULMONARY EMBOLISM, HX OF   Antiphospholipid syndrome (HCC)   Pulmonary hypertension (HCC)   Urge incontinence   Cholelithiasis with cholecystitis   Elevated LFTs   Chronic saddle pulmonary embolism with acute cor pulmonale (HCC)   Essential hypertension   Obesity hypoventilation syndrome (HCC)  Ms. Schweppe is a 70 yo F with a PMHx of  diverticulosis, Hep B, PE, HTN, OSA, HLD, CKD presenting to the hospital complaining of right sided abdominal pain.  Dyspnea: Patient with worsening dyspnea at rest since admission, now requiring 15L Venti mask.  Patient appears comfortable, though obviously tachypneic, desaturating to 83% relatively quickly on RA. Patient did receive ~6L IVF in the ED per their sepsis protocol, and patient reported symptomatic improvement after Lasix IV.  aBG does not demonstrate evidence of chronic CO2 retention.  Repeat CXR this morning shows diffuse interstitial edema vs interstitial infiltrate.  Given her IVF dose, interstitial edema on CXR, response to diuretics, and therapeutic anticoagulation prior to and since admission, we feel her symptoms are more likely due to fluid overload than PE.  However, it is hard to explain her profound hypoxia by fluid overload alone.  No evidence of ARDS on CXR or pancreatitis in lab work or CT abdomen.  Therefore, we will continue diuresis and investigate possibility of PE with V/Q scan to protect her kidneys.  If V/Q results with intermediate risk, patient would likely  need gentile IV hydration and CTA chest. [ ]  V/Q scan - Lasix 60 mg IV once  Cholecystitis and Cholangitis, improving: AST/ALT peaked 6/4. TBili slightly increased today to 6.8, but WBC also trending down.  If patient had obstructing stone, she may have passed it.  GI does not believe MRCP would be useful in the acute setting, especially considering her respiratory distress. They instead recommend intraoperative cholangiogram during cholecystectomy. Will continue Zosyn at this time while we optimize patient's respiratory status for surgery. - Surgery following, appreciate recs.  - GI consult, appreciate rec's - Zosyn [ ]  BCx  Asymptomatic bacteriuria: >100,000 colonies E coli, sensitive to Zosyn - Zosyn as above  History of PE and Antiphospholipid: On Xarelto at home. - Hold Xarelto in setting of probably  surgery - Heparin gtt   HTN: Amlodipine 5 mg daily at home. BP stable.  - HOLD PO meds  HLD: Pravastatin 40 mg daily at home - HOLD PO meds  Depression: Wellbutrin 100 mg TID at home.  - HOLD PO meds  Overactive bladder: Mirabegron at home - HOLD PO meds   DVT ppx: Heparin  Diet: clear liquids  Code: Full (confimed with patient)  Dispo: Disposition is deferred at this time, awaiting improvement of current medical problems.  Anticipated discharge in approximately 3-5 day(s).   The patient does have a current PCP [name], MD) and does need an [facility] hospital follow-up appointment after discharge.  The patient does not have transportation limitations that hinder transportation to clinic appointments.  .Services Needed at time of discharge: Y = Yes, Blank = No PT:   OT:   RN:   Equipment:   Other:     LOS: 2 days   Jana Half, MD 08/06/2015, 8:37 AM

## 2015-08-06 NOTE — Progress Notes (Signed)
Patient ID: [name], female   DOB: 02/22/1946, 70 y.o.   MRN: [id number] [facility]  Patricia DunkerCelestine T Bazen 70 y.o. 02/22/1946   Subjective:  Denies abdominal pain. On venturi mask. Dr. Russella DarStark asked [facility] to assume care today since patient is an established patient of Dr. Randa EvensEdwards.  Objective: Vital signs in last 24 hours: Filed Vitals:   08/06/15 0646 08/06/15 0714  BP:  140/64  Pulse: 96 94  Temp:  98.9 F (37.2 C)  Resp: 28 25    Physical Exam: Gen: alert, mild acute distress, morbidly obese CV: RRR Chest: Coarse breath sounds Abd: RUQ tenderness with guarding, soft, nondistended, +BS Ext: no edema  Lab Results:  Recent Labs  08/05/15 0652 08/06/15 0403  NA 138 141  K 3.2* 3.0*  CL 101 101  CO2 27 29  GLUCOSE 91 93  BUN 11 9  CREATININE 1.17* 1.26*  CALCIUM 8.0* 8.1*    Recent Labs  08/05/15 0652 08/06/15 0403  AST 248* 134*  ALT 225* 152*  ALKPHOS 111 142*  BILITOT 5.6* 6.8*  PROT 6.1* 7.1  ALBUMIN 2.8* 3.0*    Recent Labs  08/04/15 1441 08/05/15 0652 08/06/15 0403  WBC 13.1* 16.0* 13.6*  NEUTROABS 12.4*  --  11.9*  HGB 16.0* 12.8 13.0  HCT 48.5* 40.3 41.1  MCV 81.6 81.1 81.2  PLT 206 168 168   No results for input(s): LABPROT, INR in the last 72 hours.    Assessment/Plan: Acute cholecystitis with transaminitis. LFTs improving and could have passed a stone or due to inflamed gallbladder. Doubt cholangitis. MRCP cancelled due to respiratory status and doubt an adequate study could be obtained. When respiratory status more stable would do MRCP if LFTs do NOT normalize but if LFTs continue to normalize would recommend lap chole with intraoperative cholangiogram and in IOC positive then can do a postop ERCP. Continue IV Abx. Supportive care. Clear liquid diet. Will follow.   Bryanda Mikel C. 08/06/2015, 10:11 AM  Pager [phone]  If no answer or after 5 PM call [phone]

## 2015-08-06 NOTE — Patient Outreach (Signed)
[facility]) Care Management  08/06/2015  [name] February 11, 1946 [id number]  After thorough review of patient's EMR (Electronic Medical Record) in [facility], CSW noted that patient presented to the Emergency Department at [facility] on Saturday, June 3rd due to vomiting and abdominal pain.  Today, CSW received an In [employer] message from Golden Hurter, Licensed Clinical Social Worker with the [facility], stating that patient had been in contact with her to report that she would probably require surgery.  However, patient's main concern was in regards to the appeal process she is initiating with the [employer].  Patient stated to [name] that [name], Section 8 Housing Worker with the [employer], reported to her this morning that the decision was final and non-negotiable, with regards to patient receiving a two-bedroom apartment through [employer]. Patient requested that [name] contact [name], Supervisor of [name], also with the [employer], to obtain information to try and appeal the denial. [name] is already aware of CSW's attempt to write an appeal letter on patient's behalf, but reminded patient that a Reasonable Accommodation form must be initiated and that an appeal letter would not be accepted.  Patient then requested that [name] attach CSW's letter to the Reasonable Accommodation Request. [name] further informed patient, as well as CSW, that she had already been informed by the [facility] that they are unable to accept attachments and all information had to be on the specific Reasonable Accommodation form. [name] placed a call to the [employer], leaving a message for [name].  [name] also agreed to coordinate efforts with this CSW to try and provide additional information necessary for Reasonable  Accommodation form.  In addition, [name] informed patient that she would work with this CSW to try and prevent duplication of services.  Last, [name] has requested that a new Reasonable Accommodation form be faxed to her office and will include information provided by CSW, based on findings from recent home visit    CSW was able to make follow-up call to patient today regarding patient's recent phone conversation with representative from the [employer] pertaining to her request for a two-bedroom apartment through [facility].  Patient continues to report, "I don't want a slightly larger one-bedroom apartment, I want them to realize I need two bedrooms to accommodate all my medical equipment".  CSW voiced understanding, reporting that CSW is currently working with [name] to try and honor patient's request.  CSW agreed to continue to coordinate efforts with [name], but that CSW would allow [name] to communicate directly with patient, to prevent duplication of services.  Patient voiced understanding and was agreeable to this plan.  CSW will perform a case closure on patient, as all goals of treatment have been met from social work standpoint and no additional social work needs have been identified at this time.  CSW will fax a correspondence letter to patient's Primary Care Physician, Dr. Larey [name] to ensure that Dr. Lynnae [name] is aware of CSW's case closure plans.  CSW will submit a case closure request to Lurline Del, Care Management Assistant with [facility], in the form of an In [employer].   [name], BSW, MSW, LCSW  Licensed Education officer, [facility]  Mailing [facility] [address] [place], [place] [address] Physical Priscella Mann  [address], [place], [place] [address] Toll Free Main # [phone] Fax # [phone] Cell # [phone]  Fax # [phone]   [email or web address]

## 2015-08-06 NOTE — Progress Notes (Signed)
Placed patient on CPAP set at 12cm with oxygen set at 6lpm

## 2015-08-06 NOTE — Progress Notes (Signed)
ANTICOAGULATION CONSULT NOTE - Follow Up Consult  Pharmacy Consult for heparin Indication: h/o PE  Labs:  Recent Labs  08/04/15 1441 08/04/15 1452 08/05/15 0652 08/05/15 1749 08/06/15 0403  HGB 16.0*  --  12.8  --  13.0  HCT 48.5*  --  40.3  --  41.1  PLT 206  --  168  --  168  APTT  --   --  59* 53* 68*  HEPARINUNFRC  --   --  0.66  --  0.42  CREATININE 1.15*  --  1.17*  --   --   TROPONINI  --  <0.03  --   --   --      Assessment/Plan:  70yo female therapeutic on heparin after rate changes. Will continue gtt at current rate and confirm stable with additional PTT.   Vernard GamblesVeronda Neetu Carrozza, PharmD, BCPS  08/06/2015,5:39 AM

## 2015-08-07 ENCOUNTER — Inpatient Hospital Stay (HOSPITAL_COMMUNITY): Payer: Commercial Managed Care - HMO

## 2015-08-07 DIAGNOSIS — E785 Hyperlipidemia, unspecified: Secondary | ICD-10-CM

## 2015-08-07 DIAGNOSIS — E8779 Other fluid overload: Secondary | ICD-10-CM

## 2015-08-07 LAB — COMPREHENSIVE METABOLIC PANEL
ALT: 116 U/L — AB (ref 14–54)
ANION GAP: 11 (ref 5–15)
AST: 86 U/L — ABNORMAL HIGH (ref 15–41)
Albumin: 2.9 g/dL — ABNORMAL LOW (ref 3.5–5.0)
Alkaline Phosphatase: 156 U/L — ABNORMAL HIGH (ref 38–126)
BUN: 7 mg/dL (ref 6–20)
CALCIUM: 8.3 mg/dL — AB (ref 8.9–10.3)
CHLORIDE: 96 mmol/L — AB (ref 101–111)
CO2: 30 mmol/L (ref 22–32)
CREATININE: 1.01 mg/dL — AB (ref 0.44–1.00)
GFR, EST NON AFRICAN AMERICAN: 56 mL/min — AB (ref >60)
Glucose, Bld: 112 mg/dL — ABNORMAL HIGH (ref 65–99)
POTASSIUM: 2.9 mmol/L — AB (ref 3.5–5.1)
SODIUM: 137 mmol/L (ref 135–145)
TOTAL PROTEIN: 6.9 g/dL (ref 6.5–8.1)
Total Bilirubin: 7 mg/dL — ABNORMAL HIGH (ref 0.3–1.2)

## 2015-08-07 LAB — CBC WITH DIFFERENTIAL/PLATELET
BASOS ABS: 0 10*3/uL (ref 0.0–0.1)
BASOS PCT: 0 %
EOS ABS: 0.1 10*3/uL (ref 0.0–0.7)
Eosinophils Relative: 1 %
HCT: 40.5 % (ref 36.0–46.0)
HEMOGLOBIN: 13.1 g/dL (ref 12.0–15.0)
Lymphocytes Relative: 11 %
Lymphs Abs: 1.2 10*3/uL (ref 0.7–4.0)
MCH: 26.4 pg (ref 26.0–34.0)
MCHC: 32.3 g/dL (ref 30.0–36.0)
MCV: 81.5 fL (ref 78.0–100.0)
MONOS PCT: 4 %
Monocytes Absolute: 0.5 10*3/uL (ref 0.1–1.0)
NEUTROS ABS: 9.3 10*3/uL — AB (ref 1.7–7.7)
NEUTROS PCT: 84 %
Platelets: 163 10*3/uL (ref 150–400)
RBC: 4.97 MIL/uL (ref 3.87–5.11)
RDW: 14.7 % (ref 11.5–15.5)
WBC: 11 10*3/uL — AB (ref 4.0–10.5)

## 2015-08-07 LAB — HEPARIN LEVEL (UNFRACTIONATED): HEPARIN UNFRACTIONATED: 0.36 [IU]/mL (ref 0.30–0.70)

## 2015-08-07 LAB — APTT: APTT: 68 s — AB (ref 24–37)

## 2015-08-07 MED ORDER — FUROSEMIDE 10 MG/ML IJ SOLN
60.0000 mg | Freq: Once | INTRAMUSCULAR | Status: AC
  Administered 2015-08-07: 60 mg via INTRAVENOUS
  Filled 2015-08-07: qty 6

## 2015-08-07 MED ORDER — POTASSIUM CHLORIDE CRYS ER 20 MEQ PO TBCR
40.0000 meq | EXTENDED_RELEASE_TABLET | Freq: Two times a day (BID) | ORAL | Status: AC
  Administered 2015-08-07: 40 meq via ORAL
  Filled 2015-08-07: qty 2

## 2015-08-07 MED ORDER — GADOBENATE DIMEGLUMINE 529 MG/ML IV SOLN
20.0000 mL | Freq: Once | INTRAVENOUS | Status: AC | PRN
  Administered 2015-08-07: 20 mL via INTRAVENOUS

## 2015-08-07 MED ORDER — POTASSIUM CHLORIDE 10 MEQ/100ML IV SOLN
10.0000 meq | INTRAVENOUS | Status: AC
  Administered 2015-08-07 (×2): 10 meq via INTRAVENOUS
  Filled 2015-08-07 (×2): qty 100

## 2015-08-07 NOTE — Telephone Encounter (Signed)
[facility], Ms. Wallace CullensGray.  Ms. Wallace CullensGray has contacted pt directly.  [facility].  CSW will sign off, pt aware this worker is available to assist as needed following her discussion with Ms. Steele SizerGray, GHA.

## 2015-08-07 NOTE — Progress Notes (Signed)
   08/07/15 1700  Clinical Encounter Type  Visited With Patient  Visit Type Initial;Social support  Referral From Patient  Spiritual Encounters  Spiritual Needs Literature;Prayer;Emotional  Stress Factors  Patient Stress Factors Health changes  Family Stress Factors None identified  Advance Directives (For Healthcare)  Does patient have an advance directive? No  Would patient like information on creating an advanced directive? Yes - Transport plannerducational materials given  Chaplain visited with patient.  She is going into surgery in the morning and requested to have her AD completed.  Chaplain dropped off paperwork with her and will pass information to on call Chaplain. Chaplain advised patient that AD could not be notarized and completed until the morning and she was fine with that. She does want the AD completed before she goes into surgery.  Rosezella FloridaLisa M Trini Christiansen  08/07/2015  5:06 PM

## 2015-08-07 NOTE — Progress Notes (Signed)
ANTICOAGULATION CONSULT NOTE - Follow Up Consult ANTIBIOTIC CONSULT NOTE - Follow Up Consult  Pharmacy Consult for Heparin and Zosyn Indication: hx PE and sepsis  Allergies  Allergen Reactions  . Lisinopril     REACTION: PT with cough    Patient Measurements: Height: 5\' 7"  (170.2 cm) Weight: (!) 379 lb 13.6 oz (172.3 kg) IBW/kg (Calculated) : 61.6 Heparin Dosing Weight: 105kg  Vital Signs: Temp: 98.3 F (36.8 C) (06/06 0726) Temp Source: Axillary (06/06 0726) BP: 121/77 mmHg (06/06 0726) Pulse Rate: 87 (06/06 0726)  Labs:  Recent Labs  08/04/15 1452 08/05/15 0652  08/06/15 0403 08/06/15 1031 08/07/15 0411  HGB  --  12.8  --  13.0  --  13.1  HCT  --  40.3  --  41.1  --  40.5  PLT  --  168  --  168  --  163  APTT  --  59*  < > 68* 69* 68*  HEPARINUNFRC  --  0.66  --  0.42  --  0.36  CREATININE  --  1.17*  --  1.26*  --  1.01*  TROPONINI <0.03  --   --   --   --   --   < > = values in this interval not displayed.  Estimated Creatinine Clearance: 89.1 mL/min (by C-G formula based on Cr of 1.01).   Medications:  Heparin @ 1900 units/hr  Assessment: 70yof on xarelto pta for hx PE, admitted with abdominal pain and found to have acute cholecystitis. Xarelto held (last dose 6/2 @ 1230) and she was transitioned to IV heparin. Heparin level is therapeutic at 0.36. Plan is for MRCP - if CBD stone present will need ERCP prior to lap chole with IOC. She needs ~ 5 day xarelto washout prior to surgery.   Also continues on day #4 zosyn for sepsis 2/2 cholecystisis vs UTI. Renal function stable and dose appropriate.  6/3 >> Vancomycin >>6/4 6/3 >> Zosyn >> 6/3 BCx: ngtd 6/3 UCx: >100,000 E coli, amp resistant 6/3 MRSA PCR negative   Goal of Therapy:  Heparin level 0.3-0.7 units/ml Monitor platelets by anticoagulation protocol: Yes   Plan:  1) Continue heparin at 1900 units/hr 2) Daily heparin level and CBC 3) Continue zosyn 3.375g IV q8 (4 hour infusion)  Fredrik RiggerMarkle,  Zaidan Keeble Sue 08/07/2015,10:38 AM

## 2015-08-07 NOTE — Progress Notes (Addendum)
Patient ID: [name], female   DOB: 1945-03-11, 70 y.o.   MRN: [id number] [facility] Progress [facility]  [name] 70 y.o. 1945-03-11   Subjective: Feels a lot better breathing wise (on nasal cannula this morning). Denies abdominal pain.  Objective: Vital signs in last 24 hours: Filed Vitals:   08/07/15 0326 08/07/15 0330  BP:  121/69  Pulse:  88  Temp: 99.1 F (37.3 C)   Resp:  33    Physical Exam: Gen: alert, no acute distress, obese HEENT: anicteric sclera CV: RRR Chest: Coarse breath sounds anteriorly Abd: soft, nontender, nondistended, +BS  Lab Results:  Recent Labs  08/06/15 0403 08/07/15 0411  NA 141 137  K 3.0* 2.9*  CL 101 96*  CO2 29 30  GLUCOSE 93 112*  BUN 9 7  CREATININE 1.26* 1.01*  CALCIUM 8.1* 8.3*    Recent Labs  08/06/15 0403 08/07/15 0411  AST 134* 86*  ALT 152* 116*  ALKPHOS 142* 156*  BILITOT 6.8* 7.0*  PROT 7.1 6.9  ALBUMIN 3.0* 2.9*    Recent Labs  08/06/15 0403 08/07/15 0411  WBC 13.6* 11.0*  NEUTROABS 11.9* 9.3*  HGB 13.0 13.1  HCT 41.1 40.5  MCV 81.2 81.5  PLT 168 163   No results for input(s): LABPROT, INR in the last 72 hours.    Assessment/Plan: Acute cholecystitis with normalizing AST and ALT and rising TB, ALP. Rise in TB, ALP probably due to lag effect or delay in peaking of these enzymes compared with AST/ALT but since her respiratory status has improved I think she can get an MRCP now (she also thinks she could hold her breath adequately for the study). I would favor an MRCP over an ERCP as the next step since her AST, ALT are improving my clinical suspicion of a CBD stone is low but definitely still possible with rising cholestatic enzymes (TB, ALP). Will order MRCP and if a CBD stone is present then will need a preop ERCP otherwise will recommend surgery with IOC and hold off on an ERCP unless a bile duct stone is seen. Supportive care.   Equan Cogbill C. 08/07/2015, 8:38  AM  Pager [phone]  If no answer or after 5 PM call [phone]

## 2015-08-07 NOTE — Progress Notes (Signed)
Patient ID: [name], female   DOB: 29-Jun-1945, 70 y.o.   MRN: [id number] Medicine attending: I examined this patient this morning together with resident physician Dr. Venia MinksNicholas Taylor and I concur with his evaluation and management plan which we discussed together. Nuclear medicine lung scan very low probability for pulmonary embolus. She continues to improve clinically with parenteral diuresis. Renal function improved with diuresis supporting hypoperfusion of her kidneys from heart failure. Potassium 2.9 which will be replaced. Lungs are now clear. We have been able to titrate her oxygen down. Improving transaminases with plateau of bilirubin at 7 mg percent. White count trending down now 11,000 compared with peak value 16,000 on June 4. GI consultant would like to proceed with a MRCP at this time and then we anticipate surgery on her gallbladder later this week.

## 2015-08-07 NOTE — Progress Notes (Signed)
Subjective: [name].  Patient diureses 5L following Lasix 60 mg IV twice yesterday.  Her oxygen was weaned to 8L Venti mask before transitioning to CPAP at 4L overnight.  She is now on 5L  and denies respiratory distress.  Her incentive spirometry max is increasing.  Her abdominal pain is resolved.  Objective: Vital signs in last 24 hours: Filed Vitals:   08/06/15 2310 08/06/15 2315 08/07/15 0326 08/07/15 0330  BP: 103/62   121/69  Pulse: 97 99  88  Temp:   99.1 F (37.3 C)   TempSrc:   Axillary   Resp: 21 19  33  Height:      Weight:      SpO2: 94% 97%  94%   Weight change:   Intake/Output Summary (Last 24 hours) at 08/07/15 1610 Last data filed at 08/07/15 0331  Gross per 24 hour  Intake 588.33 ml  Output   5100 ml  Net -4511.67 ml   Physical Exam  Constitutional: She is oriented to person, place, and time.  Morbidly obese female, lying in bed, NAD.  HENT:  Head: Normocephalic and atraumatic.  Eyes: EOM are normal. No scleral icterus.  Cardiovascular: Regular rhythm and normal heart sounds.   Tachycardic. Heart sounds distant, but no murmurs or gallops appreciated.  Pulmonary/Chest: Effort normal. No stridor. No respiratory distress. She has no rales.  Crackles appreciated in bilateral bases.  No wheezes.  Abdominal: Soft. She exhibits no distension. There is no tenderness. There is no rebound and no guarding.  Abdomen nontender to palpation.  Musculoskeletal:  Trace pitting edema to midshin.  Neurological: She is alert and oriented to person, place, and time.  Skin: Skin is warm and dry.    Lab Results: Basic Metabolic Panel:  Recent Labs Lab 08/06/15 0403 08/07/15 0411  NA 141 137  K 3.0* 2.9*  CL 101 96*  CO2 29 30  GLUCOSE 93 112*  BUN 9 7  CREATININE 1.26* 1.01*  CALCIUM 8.1* 8.3*   Liver Function Tests:  Recent Labs Lab 08/06/15 0403 08/07/15 0411  AST 134* 86*  ALT 152* 116*  ALKPHOS 142* 156*  BILITOT 6.8* 7.0*  PROT 7.1 6.9    ALBUMIN 3.0* 2.9*    Recent Labs Lab 08/04/15 1441 08/06/15 1031  LIPASE 24 14   No results for input(s): AMMONIA in the last 168 hours. CBC:  Recent Labs Lab 08/06/15 0403 08/07/15 0411  WBC 13.6* 11.0*  NEUTROABS 11.9* 9.3*  HGB 13.0 13.1  HCT 41.1 40.5  MCV 81.2 81.5  PLT 168 163   Cardiac Enzymes:  Recent Labs Lab 08/04/15 1452  TROPONINI <0.03   BNP: No results for input(s): PROBNP in the last 168 hours. D-Dimer: No results for input(s): DDIMER in the last 168 hours. CBG: No results for input(s): GLUCAP in the last 168 hours. Hemoglobin A1C: No results for input(s): HGBA1C in the last 168 hours. Fasting Lipid Panel: No results for input(s): CHOL, HDL, LDLCALC, TRIG, CHOLHDL, LDLDIRECT in the last 168 hours. Thyroid Function Tests: No results for input(s): TSH, T4TOTAL, FREET4, T3FREE, THYROIDAB in the last 168 hours. Coagulation: No results for input(s): LABPROT, INR in the last 168 hours. Anemia Panel: No results for input(s): VITAMINB12, FOLATE, FERRITIN, TIBC, IRON, RETICCTPCT in the last 168 hours. Urine Drug Screen: Drugs of Abuse  No results found for: LABOPIA, COCAINSCRNUR, LABBENZ, AMPHETMU, THCU, LABBARB  Alcohol Level: No results for input(s): ETH in the last 168 hours. Urinalysis:  Recent Labs Lab 08/04/15 1700  COLORURINE AMBER*  LABSPEC 1.042*  PHURINE 6.0  GLUCOSEU NEGATIVE  HGBUR NEGATIVE  BILIRUBINUR SMALL*  KETONESUR NEGATIVE  PROTEINUR 30*  NITRITE POSITIVE*  LEUKOCYTESUR NEGATIVE   Misc. Labs:   Micro Results: Recent Results (from the past 240 hour(s))  Urine culture     Status: Abnormal   Collection Time: 08/04/15  5:00 PM  Result Value Ref Range Status   Specimen Description URINE, CATHETERIZED  Final   Special Requests NONE  Final   Culture >=100,000 COLONIES/mL ESCHERICHIA COLI (A)  Final   Report Status 08/06/2015 FINAL  Final   Organism ID, Bacteria ESCHERICHIA COLI (A)  Final      Susceptibility    Escherichia coli - MIC*    AMPICILLIN >=32 RESISTANT Resistant     CEFAZOLIN <=4 SENSITIVE Sensitive     CEFTRIAXONE <=1 SENSITIVE Sensitive     CIPROFLOXACIN <=0.25 SENSITIVE Sensitive     GENTAMICIN <=1 SENSITIVE Sensitive     IMIPENEM <=0.25 SENSITIVE Sensitive     NITROFURANTOIN <=16 SENSITIVE Sensitive     TRIMETH/SULFA <=20 SENSITIVE Sensitive     AMPICILLIN/SULBACTAM 16 INTERMEDIATE Intermediate     PIP/TAZO <=4 SENSITIVE Sensitive     * >=100,000 COLONIES/mL ESCHERICHIA COLI  Blood Culture (routine x 2)     Status: None (Preliminary result)   Collection Time: 08/04/15  5:50 PM  Result Value Ref Range Status   Specimen Description BLOOD LEFT HAND  Final   Special Requests IN PEDIATRIC BOTTLE 1CC  Final   Culture   Final    NO GROWTH 1 DAY [facility]    Report Status PENDING  Incomplete  Blood Culture (routine x 2)     Status: None (Preliminary result)   Collection Time: 08/04/15  5:57 PM  Result Value Ref Range Status   Specimen Description BLOOD LEFT HAND  Final   Special Requests BOTTLES DRAWN AEROBIC ONLY 5CC  Final   Culture   Final    NO GROWTH 1 DAY [facility]    Report Status PENDING  Incomplete  MRSA PCR Screening     Status: None   Collection Time: 08/04/15 10:52 PM  Result Value Ref Range Status   MRSA by PCR NEGATIVE NEGATIVE Final    Comment:        The GeneXpert MRSA Assay (FDA approved for NASAL specimens only), is one component of a comprehensive MRSA colonization surveillance program. It is not intended to diagnose MRSA infection nor to guide or monitor treatment for MRSA infections.    Studies/Results: Dg Chest 2 View  08/06/2015  CLINICAL DATA:  Shortness of breath, dyspnea EXAM: CHEST  2 VIEW COMPARISON:  08/04/2015 FINDINGS: There is diffuse bilateral interstitial thickening. There is no focal consolidation. There is no pleural effusion or pneumothorax. The heart and mediastinal contours are  unremarkable. The osseous structures are unremarkable. IMPRESSION: Diffuse bilateral interstitial thickening which may reflect interstitial edema versus interstitial infection. Electronically Signed   By: Elige KoHetal  Patel   On: 08/06/2015 09:31   Nm Pulmonary Perf And Vent  08/06/2015  CLINICAL DATA:  Shortness of breath.  Evaluate for pulmonary emboli. EXAM: NUCLEAR MEDICINE VENTILATION - PERFUSION LUNG SCAN TECHNIQUE: Ventilation images were obtained in multiple projections using inhaled aerosol Tc-3217m DTPA. Perfusion images were obtained in multiple projections after intravenous injection of Tc-1117m MAA. RADIOPHARMACEUTICALS:  4.4 mCi Technetium-4517m DTPA aerosol inhalation and 31 mCi Technetium-7517m MAA IV COMPARISON:  Chest x-ray from earlier today FINDINGS: There is  decreased ventilation to the left upper lung relative to perfusion. No suspicious perfusion defects are identified. IMPRESSION: Very low probability V/Q scan. Electronically Signed   By: Gerome Sam III M.D   On: 08/06/2015 19:09   Medications: I have reviewed the patient's current medications. Scheduled Meds: . amLODipine  5 mg Oral Daily  . buPROPion  100 mg Oral TID  . mirabegron ER  50 mg Oral Daily  . multivitamin with minerals  1 tablet Oral Daily  . piperacillin-tazobactam (ZOSYN)  IV  3.375 g Intravenous Q8H  . potassium chloride  10 mEq Intravenous Q1 Hr x 2  . potassium chloride  40 mEq Oral BID  . sodium chloride  1,000 mL Intravenous Once   Continuous Infusions: . heparin 1,900 Units/hr (08/07/15 0000)   PRN Meds:.acetaminophen, ipratropium-albuterol, morphine injection, technetium TC 25M diethylenetriame-pentaacetic acid Assessment/Plan: Principal Problem:   Cholecystitis, acute Active Problems:   Hyperlipidemia   Severe obesity (BMI >= 40) (HCC)   Major depression, chronic (HCC)   OSA (obstructive sleep apnea)   HTN (hypertension)   GERD   OA (osteoarthritis) of knee   PULMONARY EMBOLISM, HX OF    Antiphospholipid syndrome (HCC)   Pulmonary hypertension (HCC)   Urge incontinence   Cholelithiasis with cholecystitis   Elevated LFTs   Chronic saddle pulmonary embolism with acute cor pulmonale (HCC)   Essential hypertension   Obesity hypoventilation syndrome (HCC)   Dyspnea   Acute respiratory failure with hypoxia (HCC)  Ms. Lowenstein is a 70 yo F with a PMHx of diverticulosis, Hep B, PE, HTN, OSA, HLD, CKD presenting to the hospital complaining of right sided abdominal pain.  Dyspnea, improving: Patient with worsening dyspnea at rest since admission, now requiring 15L Venti mask.  Patient appears comfortable, though obviously tachypneic, desaturating to 83% relatively quickly on RA. Patient did receive ~6L IVF in the ED per their sepsis protocol, and patient reported symptomatic improvement after Lasix IV.  aBG does not demonstrate evidence of chronic CO2 retention.  Breathing improved dramatically following IV diuresis yesterday. V/Q scan very low probability of PE.  Therefore, we agree that iatrogenic volume overload is the likely etiology of her dyspnea.  Will continue diuresis today. - Lasix 60 mg IV once - I/Os  Cholecystitis and Cholangitis, improving: AST/ALT peaked 6/4. TBili slightly increased today to 6.8, but WBC also trending down.  If patient had obstructing stone, she may have passed it.  With patient's improved breathing status, we will proceed with MRCP to determine whether ERCP is needed prior to surgery. - Surgery following, appreciate recs.  - GI consult, appreciate rec's - Zosyn  BCx  MRCP  Asymptomatic bacteriuria: >100,000 colonies E coli, sensitive to Zosyn - Zosyn as above  History of PE and Antiphospholipid: On Xarelto at home. - Hold Xarelto in setting of probably surgery - Heparin gtt   HTN: Amlodipine 5 mg daily at home. BP stable.  - HOLD PO meds  HLD: Pravastatin 40 mg daily at home - HOLD PO meds  Depression: Wellbutrin 100 mg TID at home.    - HOLD PO meds  Overactive bladder: Mirabegron at home - HOLD PO meds   DVT ppx: Heparin  Diet: clear liquids  Code: Full (confimed with patient)  Dispo: Disposition is deferred at this time, awaiting improvement of current medical problems.  Anticipated discharge in approximately 3-5 day(s).   The patient does have a current PCP [name], MD) and does need an [facility] hospital follow-up appointment  after discharge.  The patient does not have transportation limitations that hinder transportation to clinic appointments.  .Services Needed at time of discharge: Y = Yes, Blank = No PT:   OT:   RN:   Equipment:   Other:     LOS: 3 days   Jana Half, MD 08/07/2015, 7:22 AM

## 2015-08-07 NOTE — Progress Notes (Signed)
Patient ID: [name], female   DOB: 09/14/1945, 70 y.o.   MRN: [id number]     [facility]      [address] [place], [place] [address]    Phone: [phone] FAX: [phone]     Subjective: k 2.9.  TB increased to 7.  Ast/alt down. Wbc trending down.  Off venti mask, to [facility]  Objective:  Vital signs:  Filed Vitals:   08/06/15 2315 08/07/15 0326 08/07/15 0330 08/07/15 0726  BP:   121/69 121/77  Pulse: 99  88 87  Temp:  99.1 F (37.3 C)  98.3 F (36.8 C)  TempSrc:  Axillary  Axillary  Resp: 19  33 23  Height:      Weight:      SpO2: 97%  94% 93%    Last BM Date: 08/04/15  Intake/Output   Yesterday:  06/05 0701 - 06/06 0700 In: 588.3 [P.O.:240; I.V.:348.3] Out: 5100 [Urine:5100] This shift:  Total I/O In: 690 [P.O.:690] Out: 150 [Urine:150]  Physical Exam: General: Pt awake/alert/oriented x4 in no acute distress  Abdomen: Soft.  Nondistended.  Non tender.  No evidence of peritonitis.  No incarcerated hernias.    Problem List:   Principal Problem:   Cholecystitis, acute Active Problems:   Hyperlipidemia   Severe obesity (BMI >= 40) (HCC)   Major depression, chronic (HCC)   OSA (obstructive sleep apnea)   HTN (hypertension)   GERD   OA (osteoarthritis) of knee   PULMONARY EMBOLISM, HX OF   Antiphospholipid syndrome (HCC)   Pulmonary hypertension (HCC)   Urge incontinence   Cholelithiasis with cholecystitis   Elevated LFTs   Chronic saddle pulmonary embolism with acute cor pulmonale (HCC)   Essential hypertension   Obesity hypoventilation syndrome (HCC)   Dyspnea   Acute respiratory failure with hypoxia ([place])    Results:   Labs: Results for orders placed or performed during the hospital encounter of 08/04/15 (from the past 48 hour(s))  APTT     Status: Abnormal   Collection Time: 08/05/15  5:49 PM  Result Value Ref Range   aPTT 53 (H) 24 - 37 seconds    Comment:        IF BASELINE  aPTT IS ELEVATED, SUGGEST PATIENT RISK ASSESSMENT BE USED TO DETERMINE APPROPRIATE ANTICOAGULANT THERAPY.   Blood gas, arterial     Status: Abnormal (Preliminary result)   Collection Time: 08/05/15  6:35 PM  Result Value Ref Range   FIO2 0.50    Delivery systems VENTURI MASK    pH, Arterial 7.418 7.350 - 7.450   pCO2 arterial 41.4 35.0 - 45.0 mmHg   pO2, Arterial 56.0 (L) 80.0 - 100.0 mmHg   Bicarbonate 26.0 (H) 20.0 - 24.0 mEq/L   TCO2 27.2 0 - 100 mmol/L   Acid-Base Excess 2.0 0.0 - 2.0 mmol/L   O2 Saturation 88.3 %   Patient temperature 100.0    Collection site RIGHT RADIAL    Drawn by [id number]    Sample type ARTERIAL DRAW    Allens test (pass/fail) PENDING PASS  Heparin level (unfractionated)     Status: None   Collection Time: 08/06/15  4:03 AM  Result Value Ref Range   Heparin Unfractionated 0.42 0.30 - 0.70 IU/mL    Comment:        IF HEPARIN RESULTS ARE BELOW EXPECTED VALUES, AND PATIENT DOSAGE HAS BEEN CONFIRMED, SUGGEST FOLLOW UP TESTING OF ANTITHROMBIN III LEVELS.   Comprehensive metabolic panel  Status: Abnormal   Collection Time: 08/06/15  4:03 AM  Result Value Ref Range   Sodium 141 135 - 145 mmol/L   Potassium 3.0 (L) 3.5 - 5.1 mmol/L   Chloride 101 101 - 111 mmol/L   CO2 29 22 - 32 mmol/L   Glucose, Bld 93 65 - 99 mg/dL   BUN 9 6 - 20 mg/dL   Creatinine, Ser 1.26 (H) 0.44 - 1.00 mg/dL   Calcium 8.1 (L) 8.9 - 10.3 mg/dL   Total Protein 7.1 6.5 - 8.1 g/dL   Albumin 3.0 (L) 3.5 - 5.0 g/dL   AST 134 (H) 15 - 41 U/L   ALT 152 (H) 14 - 54 U/L   Alkaline Phosphatase 142 (H) 38 - 126 U/L   Total Bilirubin 6.8 (H) 0.3 - 1.2 mg/dL   GFR calc non Af Amer 43 (L) >60 mL/min   GFR calc Af Amer 50 (L) >60 mL/min    Comment: (NOTE) The eGFR has been calculated using the CKD EPI equation. This calculation has not been validated in all clinical situations. eGFR's persistently <60 mL/min signify possible Chronic Kidney Disease.    Anion gap 11 5 - 15  CBC with  Differential/Platelet     Status: Abnormal   Collection Time: 08/06/15  4:03 AM  Result Value Ref Range   WBC 13.6 (H) 4.0 - 10.5 K/uL   RBC 5.06 3.87 - 5.11 MIL/uL   Hemoglobin 13.0 12.0 - 15.0 g/dL   HCT 41.1 36.0 - 46.0 %   MCV 81.2 78.0 - 100.0 fL   MCH 25.7 (L) 26.0 - 34.0 pg   MCHC 31.6 30.0 - 36.0 g/dL   RDW 15.0 11.5 - 15.5 %   Platelets 168 150 - 400 K/uL   Neutrophils Relative % 87 %   Neutro Abs 11.9 (H) 1.7 - 7.7 K/uL   Lymphocytes Relative 9 %   Lymphs Abs 1.2 0.7 - 4.0 K/uL   Monocytes Relative 4 %   Monocytes Absolute 0.5 0.1 - 1.0 K/uL   Eosinophils Relative 0 %   Eosinophils Absolute 0.0 0.0 - 0.7 K/uL   Basophils Relative 0 %   Basophils Absolute 0.0 0.0 - 0.1 K/uL  APTT     Status: Abnormal   Collection Time: 08/06/15  4:03 AM  Result Value Ref Range   aPTT 68 (H) 24 - 37 seconds    Comment:        IF BASELINE aPTT IS ELEVATED, SUGGEST PATIENT RISK ASSESSMENT BE USED TO DETERMINE APPROPRIATE ANTICOAGULANT THERAPY.   APTT     Status: Abnormal   Collection Time: 08/06/15 10:31 AM  Result Value Ref Range   aPTT 69 (H) 24 - 37 seconds    Comment:        IF BASELINE aPTT IS ELEVATED, SUGGEST PATIENT RISK ASSESSMENT BE USED TO DETERMINE APPROPRIATE ANTICOAGULANT THERAPY.   Lipase, blood     Status: None   Collection Time: 08/06/15 10:31 AM  Result Value Ref Range   Lipase 14 11 - 51 U/L  Heparin level (unfractionated)     Status: None   Collection Time: 08/07/15  4:11 AM  Result Value Ref Range   Heparin Unfractionated 0.36 0.30 - 0.70 IU/mL    Comment:        IF HEPARIN RESULTS ARE BELOW EXPECTED VALUES, AND PATIENT DOSAGE HAS BEEN CONFIRMED, SUGGEST FOLLOW UP TESTING OF ANTITHROMBIN III LEVELS.   Comprehensive metabolic panel     Status: Abnormal  Collection Time: 08/07/15  4:11 AM  Result Value Ref Range   Sodium 137 135 - 145 mmol/L   Potassium 2.9 (L) 3.5 - 5.1 mmol/L   Chloride 96 (L) 101 - 111 mmol/L   CO2 30 22 - 32 mmol/L    Glucose, Bld 112 (H) 65 - 99 mg/dL   BUN 7 6 - 20 mg/dL   Creatinine, Ser 1.01 (H) 0.44 - 1.00 mg/dL   Calcium 8.3 (L) 8.9 - 10.3 mg/dL   Total Protein 6.9 6.5 - 8.1 g/dL   Albumin 2.9 (L) 3.5 - 5.0 g/dL   AST 86 (H) 15 - 41 U/L   ALT 116 (H) 14 - 54 U/L   Alkaline Phosphatase 156 (H) 38 - 126 U/L   Total Bilirubin 7.0 (H) 0.3 - 1.2 mg/dL   GFR calc non Af Amer 56 (L) >60 mL/min   GFR calc Af Amer >60 >60 mL/min    Comment: (NOTE) The eGFR has been calculated using the CKD EPI equation. This calculation has not been validated in all clinical situations. eGFR's persistently <60 mL/min signify possible Chronic Kidney Disease.    Anion gap 11 5 - 15  CBC with Differential/Platelet     Status: Abnormal   Collection Time: 08/07/15  4:11 AM  Result Value Ref Range   WBC 11.0 (H) 4.0 - 10.5 K/uL   RBC 4.97 3.87 - 5.11 MIL/uL   Hemoglobin 13.1 12.0 - 15.0 g/dL   HCT 40.5 36.0 - 46.0 %   MCV 81.5 78.0 - 100.0 fL   MCH 26.4 26.0 - 34.0 pg   MCHC 32.3 30.0 - 36.0 g/dL   RDW 14.7 11.5 - 15.5 %   Platelets 163 150 - 400 K/uL   Neutrophils Relative % 84 %   Neutro Abs 9.3 (H) 1.7 - 7.7 K/uL   Lymphocytes Relative 11 %   Lymphs Abs 1.2 0.7 - 4.0 K/uL   Monocytes Relative 4 %   Monocytes Absolute 0.5 0.1 - 1.0 K/uL   Eosinophils Relative 1 %   Eosinophils Absolute 0.1 0.0 - 0.7 K/uL   Basophils Relative 0 %   Basophils Absolute 0.0 0.0 - 0.1 K/uL  APTT     Status: Abnormal   Collection Time: 08/07/15  4:11 AM  Result Value Ref Range   aPTT 68 (H) 24 - 37 seconds    Comment:        IF BASELINE aPTT IS ELEVATED, SUGGEST PATIENT RISK ASSESSMENT BE USED TO DETERMINE APPROPRIATE ANTICOAGULANT THERAPY.     Imaging / Studies: Dg Chest 2 View  08/06/2015  CLINICAL DATA:  Shortness of breath, dyspnea EXAM: CHEST  2 VIEW COMPARISON:  08/04/2015 FINDINGS: There is diffuse bilateral interstitial thickening. There is no focal consolidation. There is no pleural effusion or pneumothorax. The  heart and mediastinal contours are unremarkable. The osseous structures are unremarkable. IMPRESSION: Diffuse bilateral interstitial thickening which may reflect interstitial edema versus interstitial infection. Electronically Signed   By: Kathreen Devoid   On: 08/06/2015 09:31   Nm Pulmonary Perf And Vent  08/06/2015  CLINICAL DATA:  Shortness of breath.  Evaluate for pulmonary emboli. EXAM: NUCLEAR MEDICINE VENTILATION - PERFUSION LUNG SCAN TECHNIQUE: Ventilation images were obtained in multiple projections using inhaled aerosol Tc-68mDTPA. Perfusion images were obtained in multiple projections after intravenous injection of Tc-986mAA. RADIOPHARMACEUTICALS:  4.4 mCi Technetium-9967mPA aerosol inhalation and 31 mCi Technetium-70m21m IV COMPARISON:  Chest x-ray from earlier today FINDINGS: There is decreased ventilation  to the left upper lung relative to perfusion. No suspicious perfusion defects are identified. IMPRESSION: Very low probability V/Q scan. Electronically Signed   By: Dorise Bullion III M.D   On: 08/06/2015 19:09    Medications / Allergies:  Scheduled Meds: . amLODipine  5 mg Oral Daily  . buPROPion  100 mg Oral TID  . mirabegron ER  50 mg Oral Daily  . multivitamin with minerals  1 tablet Oral Daily  . piperacillin-tazobactam (ZOSYN)  IV  3.375 g Intravenous Q8H  . sodium chloride  1,000 mL Intravenous Once   Continuous Infusions: . heparin 1,900 Units/hr (08/07/15 0919)   PRN Meds:.acetaminophen, ipratropium-albuterol, morphine injection, technetium TC 39M diethylenetriame-pentaacetic acid  Antibiotics: Anti-infectives    Start     Dose/Rate Route Frequency Ordered Stop   08/05/15 1800  vancomycin (VANCOCIN) 1,750 mg in sodium chloride 0.9 % 500 mL IVPB  Status:  Discontinued     1,750 mg 250 mL/hr over 120 Minutes Intravenous Every 24 hours 08/04/15 1740 08/04/15 2337   08/05/15 0000  piperacillin-tazobactam (ZOSYN) IVPB 3.375 g     3.375 g 12.5 mL/hr over 240 Minutes  Intravenous Every 8 hours 08/04/15 1744     08/04/15 1745  vancomycin (VANCOCIN) 2,500 mg in sodium chloride 0.9 % 500 mL IVPB     2,500 mg 250 mL/hr over 120 Minutes Intravenous STAT 08/04/15 1731 08/04/15 2103   08/04/15 1730  piperacillin-tazobactam (ZOSYN) IVPB 3.375 g     3.375 g 100 mL/hr over 30 Minutes Intravenous  Once 08/04/15 1724 08/04/15 1904   08/04/15 1730  vancomycin (VANCOCIN) IVPB 1000 mg/200 mL premix  Status:  Discontinued     1,000 mg 200 mL/hr over 60 Minutes Intravenous  Once 08/04/15 1724 08/04/15 1731        Assessment/Plan Acute calculous cholecystitis  Abnormal LFTs, with rising TB -await MRCP -would need medical clearance prior to surgery given hypoxia, OSA, morbid obesity Pulm-hx PE, xarelto on hold.  OSA, non compliant with CPAP, worsening hypoxia CV-HTN, HLD VTE prophylaxis-Heparin gtt ID-zosyn FEN-NPO p MN.  Supplement k per primary team  Erby Pian, [facility] Surgery Pager [phone](7A-4:30P) For consults and floor pages call [phone](7A-4:30P)  08/07/2015 10:50 AM

## 2015-08-08 DIAGNOSIS — J810 Acute pulmonary edema: Secondary | ICD-10-CM

## 2015-08-08 LAB — CBC WITH DIFFERENTIAL/PLATELET
BASOS PCT: 0 %
Basophils Absolute: 0 10*3/uL (ref 0.0–0.1)
EOS ABS: 0.1 10*3/uL (ref 0.0–0.7)
EOS PCT: 2 %
HCT: 41.2 % (ref 36.0–46.0)
HEMOGLOBIN: 13.4 g/dL (ref 12.0–15.0)
Lymphocytes Relative: 18 %
Lymphs Abs: 1.3 10*3/uL (ref 0.7–4.0)
MCH: 26 pg (ref 26.0–34.0)
MCHC: 32.5 g/dL (ref 30.0–36.0)
MCV: 79.8 fL (ref 78.0–100.0)
MONOS PCT: 6 %
Monocytes Absolute: 0.5 10*3/uL (ref 0.1–1.0)
NEUTROS PCT: 74 %
Neutro Abs: 5.6 10*3/uL (ref 1.7–7.7)
PLATELETS: 184 10*3/uL (ref 150–400)
RBC: 5.16 MIL/uL — AB (ref 3.87–5.11)
RDW: 14.6 % (ref 11.5–15.5)
WBC: 7.6 10*3/uL (ref 4.0–10.5)

## 2015-08-08 LAB — COMPREHENSIVE METABOLIC PANEL
ALK PHOS: 158 U/L — AB (ref 38–126)
ALT: 108 U/L — AB (ref 14–54)
AST: 91 U/L — ABNORMAL HIGH (ref 15–41)
Albumin: 2.7 g/dL — ABNORMAL LOW (ref 3.5–5.0)
Anion gap: 13 (ref 5–15)
BILIRUBIN TOTAL: 3.8 mg/dL — AB (ref 0.3–1.2)
BUN: 7 mg/dL (ref 6–20)
CALCIUM: 8.7 mg/dL — AB (ref 8.9–10.3)
CO2: 28 mmol/L (ref 22–32)
CREATININE: 1.06 mg/dL — AB (ref 0.44–1.00)
Chloride: 96 mmol/L — ABNORMAL LOW (ref 101–111)
GFR, EST NON AFRICAN AMERICAN: 53 mL/min — AB (ref >60)
Glucose, Bld: 93 mg/dL (ref 65–99)
Potassium: 3.1 mmol/L — ABNORMAL LOW (ref 3.5–5.1)
Sodium: 137 mmol/L (ref 135–145)
Total Protein: 7.1 g/dL (ref 6.5–8.1)

## 2015-08-08 LAB — HEPARIN LEVEL (UNFRACTIONATED): Heparin Unfractionated: 0.39 IU/mL (ref 0.30–0.70)

## 2015-08-08 LAB — MAGNESIUM: MAGNESIUM: 2.1 mg/dL (ref 1.7–2.4)

## 2015-08-08 MED ORDER — POTASSIUM CHLORIDE CRYS ER 20 MEQ PO TBCR
40.0000 meq | EXTENDED_RELEASE_TABLET | Freq: Two times a day (BID) | ORAL | Status: AC
  Administered 2015-08-08 – 2015-08-09 (×4): 40 meq via ORAL
  Filled 2015-08-08 (×4): qty 2

## 2015-08-08 MED ORDER — POTASSIUM CHLORIDE CRYS ER 20 MEQ PO TBCR
40.0000 meq | EXTENDED_RELEASE_TABLET | Freq: Once | ORAL | Status: AC
  Administered 2015-08-08: 40 meq via ORAL
  Filled 2015-08-08: qty 2

## 2015-08-08 MED ORDER — POTASSIUM CHLORIDE 10 MEQ/100ML IV SOLN
10.0000 meq | INTRAVENOUS | Status: AC
  Administered 2015-08-08 (×2): 10 meq via INTRAVENOUS
  Filled 2015-08-08 (×2): qty 100

## 2015-08-08 MED ORDER — FUROSEMIDE 10 MG/ML IJ SOLN
60.0000 mg | Freq: Every day | INTRAMUSCULAR | Status: DC
  Administered 2015-08-09 – 2015-08-11 (×3): 60 mg via INTRAVENOUS
  Filled 2015-08-08 (×3): qty 6

## 2015-08-08 MED ORDER — FUROSEMIDE 10 MG/ML IJ SOLN
60.0000 mg | Freq: Two times a day (BID) | INTRAMUSCULAR | Status: DC
  Administered 2015-08-08: 60 mg via INTRAVENOUS
  Filled 2015-08-08: qty 6

## 2015-08-08 MED ORDER — POTASSIUM CHLORIDE 10 MEQ/100ML IV SOLN
10.0000 meq | INTRAVENOUS | Status: DC
  Administered 2015-08-08: 10 meq via INTRAVENOUS
  Filled 2015-08-08: qty 100

## 2015-08-08 NOTE — Progress Notes (Signed)
Patient ID: [name], female   DOB: March 28, 1945, 70 y.o.   MRN: [id number] [facility]  Patricia DunkerCelestine T [name] 70 y.o. March 28, 1945   Subjective: Denies abdominal pain. Feels ok.   Objective: Vital signs in last 24 hours: Filed Vitals:   08/08/15 0748 08/08/15 0749  BP:  116/80  Pulse:  78  Temp: 98 F (36.7 C)   Resp:  24    Physical Exam: Gen: alert, no acute distress, obese HEENT: anicteric sclera CV: RRR Chest: CTA B Abd: RUQ tenderness with guarding, otherwise nontender, soft, nondistended, +BS Ext: no edema  Lab Results:  Recent Labs  08/07/15 0411 08/08/15 0407  NA 137 137  K 2.9* 3.1*  CL 96* 96*  CO2 30 28  GLUCOSE 112* 93  BUN 7 7  CREATININE 1.01* 1.06*  CALCIUM 8.3* 8.7*  MG  --  2.1    Recent Labs  08/07/15 0411 08/08/15 0407  AST 86* 91*  ALT 116* 108*  ALKPHOS 156* 158*  BILITOT 7.0* 3.8*  PROT 6.9 7.1  ALBUMIN 2.9* 2.7*    Recent Labs  08/07/15 0411 08/08/15 0407  WBC 11.0* 7.6  NEUTROABS 9.3* 5.6  HGB 13.1 13.4  HCT 40.5 41.2  MCV 81.5 79.8  PLT 163 184   No results for input(s): LABPROT, INR in the last 72 hours.    Assessment/Plan: Acute cholecystitis and MRCP yesterday negative for CBD stones. TB improving and no significant change in other liver enzymes. Would ask primary team to help decide timing of chest CT with contrast as suggested on MRCP reading. Surgery planned and if IOC positive then call us back otherwise will sign off. Dr. Dulce Sellarutlaw available tomorrow and this weekend if GI needed.   Kierrah Kilbride C. 08/08/2015, 8:46 AM  Pager [phone]  If no answer or after 5 PM call [phone]

## 2015-08-08 NOTE — Progress Notes (Signed)
Subjective: She is feeling much better and has no pain.  Breathing stable.  She says she is ready to go.    Objective: Vital signs in last 24 hours: Temp:  [98 F (36.7 C)-98.7 F (37.1 C)] 98 F (36.7 C) (06/07 0748) Pulse Rate:  [73-93] 78 (06/07 0749) Resp:  [17-36] 24 (06/07 0749) BP: (110-139)/(56-84) 116/80 mmHg (06/07 0749) SpO2:  [91 %-96 %] 93 % (06/07 0749) Weight:  [171.913 kg (379 lb)-174.7 kg (385 lb 2.3 oz)] 174.7 kg (385 lb 2.3 oz) (06/07 0422) Last BM Date: 08/04/15 930 Po Urine  2350 Stool x 1 Afebrile, VSS K+ 3.1 Creatinine better 1.06 Normal WBC LFT's improving MRI:  Study is positive for cholelithiasis. Additionally, there is gallbladder distention, mild gallbladder wall thickening and a trace amount of pericholecystic fluid. Clinical correlation for signs and symptoms of acute cholecystitis is recommended.  No evidence of choledocholithiasis and no findings to suggest biliary tract obstruction at this time.  Intake/Output from previous day: 06/06 0701 - 06/07 0700 In: 1831 [P.O.:930; I.V.:551; IV Piggyback:350] Out: 2350 [Urine:2350] Intake/Output this shift: Total I/O In: 53.2 [I.V.:53.2] Out: 200 [Urine:200]  General appearance: alert, cooperative and no distress GI: soft, non-tender; bowel sounds normal; no masses,  no organomegaly  Lab Results:   Recent Labs  08/07/15 0411 08/08/15 0407  WBC 11.0* 7.6  HGB 13.1 13.4  HCT 40.5 41.2  PLT 163 184    BMET  Recent Labs  08/07/15 0411 08/08/15 0407  NA 137 137  K 2.9* 3.1*  CL 96* 96*  CO2 30 28  GLUCOSE 112* 93  BUN 7 7  CREATININE 1.01* 1.06*  CALCIUM 8.3* 8.7*   PT/INR No results for input(s): LABPROT, INR in the last 72 hours.   Recent Labs Lab 08/04/15 1441 08/05/15 0652 08/06/15 0403 08/07/15 0411 08/08/15 0407  AST 360* 248* 134* 86* 91*  ALT 184* 225* 152* 116* 108*  ALKPHOS 124 111 142* 156* 158*  BILITOT 2.3* 5.6* 6.8* 7.0* 3.8*  PROT 8.8* 6.1* 7.1  6.9 7.1  ALBUMIN 4.4 2.8* 3.0* 2.9* 2.7*     Lipase     Component Value Date/Time   LIPASE 14 08/06/2015 1031     Studies/Results: Dg Chest 2 View  08/06/2015  CLINICAL DATA:  Shortness of breath, dyspnea EXAM: CHEST  2 VIEW COMPARISON:  08/04/2015 FINDINGS: There is diffuse bilateral interstitial thickening. There is no focal consolidation. There is no pleural effusion or pneumothorax. The heart and mediastinal contours are unremarkable. The osseous structures are unremarkable. IMPRESSION: Diffuse bilateral interstitial thickening which may reflect interstitial edema versus interstitial infection. Electronically Signed   By: Hetal  Patel   On: 08/06/2015 09:31   Nm Pulmonary Perf And Vent  08/06/2015  CLINICAL DATA:  Shortness of breath.  Evaluate for pulmonary emboli. EXAM: NUCLEAR MEDICINE VENTILATION - PERFUSION LUNG SCAN TECHNIQUE: Ventilation images were obtained in multiple projections using inhaled aerosol Tc-99m DTPA. Perfusion images were obtained in multiple projections after intravenous injection of Tc-99m MAA. RADIOPHARMACEUTICALS:  4.4 mCi Technetium-99m DTPA aerosol inhalation and 31 mCi Technetium-99m MAA IV COMPARISON:  Chest x-ray from earlier today FINDINGS: There is decreased ventilation to the left upper lung relative to perfusion. No suspicious perfusion defects are identified. IMPRESSION: Very low probability V/Q scan. Electronically Signed   By: David  Williams III M.D   On: 08/06/2015 19:09   Mr Abd W/wo Cm/mrcp  08/07/2015  CLINICAL DATA:  70-year-old female with history of left upper quadrant abdominal pain since   Saturday 08/04/2015. Some vomiting. EXAM: MRI ABDOMEN WITHOUT AND WITH CONTRAST (INCLUDING MRCP) TECHNIQUE: Multiplanar multisequence MR imaging of the abdomen was performed both before and after the administration of intravenous contrast. Heavily T2-weighted images of the biliary and pancreatic ducts were obtained, and three-dimensional MRCP images were rendered by  post processing. CONTRAST:  20mL MULTIHANCE GADOBENATE DIMEGLUMINE 529 MG/ML IV SOLN COMPARISON:  No prior abdominal MRI. CT the abdomen and pelvis 08/04/2015. FINDINGS: Lower chest: There is a suggestion of a mass lesion in the right mid to upper lung (image 1 of series 15) which is incompletely visualized but appears to measure approximately 2.5 x 3.5 cm, and is also visualized on postcontrast imaging (example on image 1 of series 1604). However, this lesion is favored to be artifactual related to phase wrap artifact, as this area is covered on coronal post gadolinium imaging and no nodule or mass is identified. Linear areas of signal intensity are noted in the lung bases bilaterally, favored to reflect areas of subsegmental atelectasis and/or scarring. Trace right pleural effusion lying dependently. Hepatobiliary: Diffuse heterogeneous loss of signal intensity throughout the hepatic parenchyma on out of phase dual echo images, compatible with hepatic steatosis. No suspicious cystic or solid hepatic lesions. All filling defects are noted within the gallbladder, compatible with gallstones. Gallbladder is moderately distended. Trace amount of pericholecystic fluid. Gallbladder wall thickness is slightly increased at 4 mm. MRCP images demonstrate no intra or extrahepatic biliary ductal dilatation. Additionally, there is no filling defect in the common bile duct to suggest choledocholithiasis. Common bile duct is normal in caliber measuring 4 mm in the porta hepatis. Pancreas: No pancreatic mass. No pancreatic ductal dilatation on MRCP images. No pancreatic or peripancreatic fluid or inflammatory changes. Spleen: Unremarkable. Adrenals/Urinary Tract: Bilateral adrenal glands are normal in appearance. In the kidneys bilaterally there are lesions that are low T1 signal intensity, high T2 signal intensity, and do not enhance, compatible with simple cysts, the largest of which is exophytic measuring 9.2 cm in the upper  pole of the left kidney. No suspicious renal lesions. No hydroureteronephrosis in the visualized abdomen. Stomach/Bowel: Visualized portions are unremarkable. Vascular/Lymphatic: No aneurysm identified in the visualized abdominal vasculature. No lymphadenopathy noted in the abdomen. Other: No significant volume of ascites in the visualized peritoneal cavity. Musculoskeletal: No aggressive osseous lesions are identified in the visualized portions of the skeleton. IMPRESSION: 1. Study is positive for cholelithiasis. Additionally, there is gallbladder distention, mild gallbladder wall thickening and a trace amount of pericholecystic fluid. Clinical correlation for signs and symptoms of acute cholecystitis is recommended. 2. No evidence of choledocholithiasis and no findings to suggest biliary tract obstruction at this time. 3. Some sequences demonstrate an apparent mass-like area of increased signal in the mid to upper right lung, as discussed above, however, other pulse sequences demonstrate no such lesion, suggesting that this could be phase wrap artifact at the upper aspect of the acquired images (related to wrap from lower anatomic structures in the low abdomen or pelvis). However, further evaluation with contrast enhanced chest CT is suggested in the near future to exclude underlying pulmonary malignancy. 4. Heterogeneous hepatic steatosis. 5. Simple cysts in the kidneys bilaterally measuring up to 9.2 cm in the upper pole of the left kidney. These results will be called to the ordering clinician or representative by the Radiologist Assistant, and communication documented in the PACS or zVision Dashboard. Electronically Signed   By: Daniel  Entrikin M.D.   On: 08/07/2015 16:11    Medications: .   amLODipine  5 mg Oral Daily  . buPROPion  100 mg Oral TID  . furosemide  60 mg Intravenous BID  . mirabegron ER  50 mg Oral Daily  . multivitamin with minerals  1 tablet Oral Daily  . piperacillin-tazobactam  (ZOSYN)  IV  3.375 g Intravenous Q8H  . potassium chloride  10 mEq Intravenous Q1 Hr x 3  . potassium chloride  40 mEq Oral BID  . sodium chloride  1,000 mL Intravenous Once   . heparin 1,900 Units/hr (08/08/15 0748)   Assessment/Plan   Acute cholecystitis/cholelithiasis Elevated LFT's - improving On O2 - stable   Morbid obesity Body mass index is 59.43  Pulmonary Emboli on Xarelto (last dose 08/13/15 @ 12:30 PM) OSA on CPAP Chronic renal insuffiencey - stable Hypokalemia - being replaced Hepatitis B Limited mobility uses a walker  Depression Arthritis  Hypertension FEN: NPO/ ID: Day 5 Zosyn, one dose of vancomycin on admit VTE: Heparin drip  Plan:  If clear by Medicine we will plan surgery tomorrow. I will put her back on clears for now.  I have ask pharmacy to hold heparin at 4 AM for surgery later in the morning.    LOS: 4 days    Malayia Spizzirri 08/08/2015 [phone]  

## 2015-08-08 NOTE — Evaluation (Signed)
Physical Therapy Evaluation Patient Details Name: [name] MRN: [id number] DOB: 02/12/1946 Today's Date: 08/08/2015   History of Present Illness  Pt admit with cholecystitis.  Plan for surgery 08/09/15.  Hx of PE.    Clinical Impression  Pt admitted with above diagnosis. Pt currently with functional limitations due to the deficits listed below (see PT Problem List). Pt was able to transfer to chair with min assist and RW.  Should progress well.  Will need re-eval after surgery to address home needs.  Will follow acutely.   Pt will benefit from skilled PT to increase their independence and safety with mobility to allow discharge to the venue listed below.      Follow Up Recommendations  (TBA after surgery)    Equipment Recommendations  Other (comment) (TBA after surgery)    Recommendations for Other Services       Precautions / Restrictions Precautions Precautions: Fall Restrictions Weight Bearing Restrictions: No      Mobility  Bed Mobility Overal bed mobility: Needs Assistance Bed Mobility: Supine to Sit     Supine to sit: Min guard     General bed mobility comments: took incr time but was able to complete without physical assist.  Transfers Overall transfer level: Needs assistance Equipment used: Rolling walker (2 wheeled) Transfers: Sit to/from [employer] Sit to Stand: Min assist Stand pivot transfers: Min guard       General transfer comment: min assist to power up.  Able to step around to chair without physical assist.   Ambulation/Gait                Stairs            Wheelchair Mobility    Modified Rankin (Stroke Patients Only)       Balance                                             Pertinent Vitals/Pain Pain Assessment: No/denies pain  VSS    Home Living Family/patient expects to be discharged to:: Private residence Living Arrangements: Alone Available Help at Discharge:  Friend(s);Available PRN/intermittently Type of Home: Apartment Home Access: Level entry     Home Layout: One level Home Equipment: Walker - 2 wheels;Bedside commode Additional Comments: States her apt is so small she can't put her 3N1 beside the bed.     Prior Function Level of Independence: Independent               Hand Dominance        Extremity/Trunk Assessment   Upper Extremity Assessment: Defer to OT evaluation           Lower Extremity Assessment: Generalized weakness      Cervical / Trunk Assessment: Normal  Communication   Communication: No difficulties  Cognition Arousal/Alertness: Awake/alert Behavior During Therapy: WFL for tasks assessed/performed Overall Cognitive Status: Within Functional Limits for tasks assessed                      General Comments      Exercises        Assessment/Plan    PT Assessment Patient needs continued PT services  PT Diagnosis Generalized weakness   PT Problem List Decreased activity tolerance;Decreased balance;Decreased mobility;Decreased knowledge of use of DME;Decreased safety awareness;Decreased knowledge of precautions  PT Treatment Interventions DME instruction;Gait training;Functional mobility  training;Therapeutic activities;Therapeutic exercise;Balance training;Patient/family education   PT Goals (Current goals can be found in the Care Plan section) Acute Rehab PT Goals Patient Stated Goal: to go home PT Goal Formulation: With patient Time For Goal Achievement: 08/22/15 Potential to Achieve Goals: Good    Frequency Min 3X/week   Barriers to discharge Decreased caregiver support      Co-evaluation               End of Session Equipment Utilized During Treatment: Gait belt Activity Tolerance: Patient limited by fatigue Patient left: in chair;with call bell/phone within reach Nurse Communication: Mobility status         Time: [id number] PT Time Calculation (min) (ACUTE  ONLY): 26 min   Charges:   PT Evaluation $PT Eval Moderate Complexity: 1 Procedure PT Treatments $Therapeutic Activity: 8-22 mins   PT G CodesTawni Millers:        Gentri Guardado F 08/08/2015, 1:19 PM Avanell Banwart,PT Acute Rehabilitation [phone] (pager)

## 2015-08-08 NOTE — Progress Notes (Signed)
   08/08/15 0900  Clinical Encounter Type  Visited With Patient  Visit Type Follow-up;Spiritual support  Referral From Patient  Spiritual Encounters  Spiritual Needs Emotional  Stress Factors  Patient Stress Factors None identified  Advance Directives (For Healthcare)  Does patient have an advance directive? Yes  Would patient like information on creating an advanced directive? Yes - Transport plannerducational materials given  Type of Estate agentAdvance Directive Healthcare Power of [place]'s[place]orney;Living will  Does patient want to make changes to advanced directive? Yes - information given  Copy of advanced directive(s) in chart? No - copy requested  Chaplain visited with patient to complete AD paperwork prior to patient going to surgery.  Chaplain provided ministry of presence to patient.  Rosezella FloridaLisa M Enzio Buchler  08/08/2015  9:22 AM

## 2015-08-08 NOTE — Progress Notes (Addendum)
Subjective: [name]  Breathing status continues to improve.  She has been weaned to 2L [name], however, she becomes increasingly hypoxic when lying flat.  She is eager to proceed with surgery.  Objective: Vital signs in last 24 hours: Filed Vitals:   08/08/15 0345 08/08/15 0422 08/08/15 0748 08/08/15 0749  BP:    116/80  Pulse:    78  Temp: 98.6 F (37 C)  98 F (36.7 C)   TempSrc: Oral  Oral   Resp:    24  Height:      Weight:  385 lb 2.3 oz (174.7 kg)    SpO2:    93%   Weight change:   Intake/Output Summary (Last 24 hours) at 08/08/15 0758 Last data filed at 08/08/15 0748  Gross per 24 hour  Intake 1884.2 ml  Output   2400 ml  Net -515.8 ml   Physical Exam  Constitutional: She is oriented to person, place, and time.  Morbidly obese female, lying in bed, NAD.  HENT:  Head: Normocephalic and atraumatic.  Eyes: EOM are normal. No scleral icterus.  Cardiovascular: Normal rate, regular rhythm and normal heart sounds.   Heart sounds distant, but no murmurs or gallops appreciated.  Pulmonary/Chest: Effort normal. No stridor. No respiratory distress. She has no rales.  Lungs clear in anterior zones.  Abdominal: Soft. She exhibits no distension. There is no tenderness. There is no rebound and no guarding.  Minimal tenderness to deep palpation.  Musculoskeletal:  Trace pitting edema to midshin.  Neurological: She is alert and oriented to person, place, and time.  Skin: Skin is warm and dry.    Lab Results: Basic Metabolic Panel:  Recent Labs Lab 08/07/15 0411 08/08/15 0407  NA 137 137  K 2.9* 3.1*  CL 96* 96*  CO2 30 28  GLUCOSE 112* 93  BUN 7 7  CREATININE 1.01* 1.06*  CALCIUM 8.3* 8.7*  MG  --  2.1   Liver Function Tests:  Recent Labs Lab 08/07/15 0411 08/08/15 0407  AST 86* 91*  ALT 116* 108*  ALKPHOS 156* 158*  BILITOT 7.0* 3.8*  PROT 6.9 7.1  ALBUMIN 2.9* 2.7*    Recent Labs Lab 08/04/15 1441 08/06/15 1031  LIPASE 24 14   No results for  input(s): AMMONIA in the last 168 hours. CBC:  Recent Labs Lab 08/07/15 0411 08/08/15 0407  WBC 11.0* 7.6  NEUTROABS 9.3* 5.6  HGB 13.1 13.4  HCT 40.5 41.2  MCV 81.5 79.8  PLT 163 184   Cardiac Enzymes:  Recent Labs Lab 08/04/15 1452  TROPONINI <0.03   BNP: No results for input(s): PROBNP in the last 168 hours. D-Dimer: No results for input(s): DDIMER in the last 168 hours. CBG: No results for input(s): GLUCAP in the last 168 hours. Hemoglobin A1C: No results for input(s): HGBA1C in the last 168 hours. Fasting Lipid Panel: No results for input(s): CHOL, HDL, LDLCALC, TRIG, CHOLHDL, LDLDIRECT in the last 168 hours. Thyroid Function Tests: No results for input(s): TSH, T4TOTAL, FREET4, T3FREE, THYROIDAB in the last 168 hours. Coagulation: No results for input(s): LABPROT, INR in the last 168 hours. Anemia Panel: No results for input(s): VITAMINB12, FOLATE, FERRITIN, TIBC, IRON, RETICCTPCT in the last 168 hours. Urine Drug Screen: Drugs of Abuse  No results found for: LABOPIA, COCAINSCRNUR, LABBENZ, AMPHETMU, THCU, LABBARB  Alcohol Level: No results for input(s): ETH in the last 168 hours. Urinalysis:  Recent Labs Lab 08/04/15 1700  COLORURINE AMBER*  LABSPEC 1.042*  PHURINE 6.0  GLUCOSEU NEGATIVE  HGBUR NEGATIVE  BILIRUBINUR SMALL*  KETONESUR NEGATIVE  PROTEINUR 30*  NITRITE POSITIVE*  LEUKOCYTESUR NEGATIVE   Misc. Labs:   Micro Results: Recent Results (from the past 240 hour(s))  Urine culture     Status: Abnormal   Collection Time: 08/04/15  5:00 PM  Result Value Ref Range Status   Specimen Description URINE, CATHETERIZED  Final   Special Requests NONE  Final   Culture >=100,000 COLONIES/mL ESCHERICHIA COLI (A)  Final   Report Status 08/06/2015 FINAL  Final   Organism ID, Bacteria ESCHERICHIA COLI (A)  Final      Susceptibility   Escherichia coli - MIC*    AMPICILLIN >=32 RESISTANT Resistant     CEFAZOLIN <=4 SENSITIVE Sensitive      CEFTRIAXONE <=1 SENSITIVE Sensitive     CIPROFLOXACIN <=0.25 SENSITIVE Sensitive     GENTAMICIN <=1 SENSITIVE Sensitive     IMIPENEM <=0.25 SENSITIVE Sensitive     NITROFURANTOIN <=16 SENSITIVE Sensitive     TRIMETH/SULFA <=20 SENSITIVE Sensitive     AMPICILLIN/SULBACTAM 16 INTERMEDIATE Intermediate     PIP/TAZO <=4 SENSITIVE Sensitive     * >=100,000 COLONIES/mL ESCHERICHIA COLI  Blood Culture (routine x 2)     Status: None (Preliminary result)   Collection Time: 08/04/15  5:50 PM  Result Value Ref Range Status   Specimen Description BLOOD LEFT HAND  Final   Special Requests IN PEDIATRIC BOTTLE 1CC  Final   Culture   Final    NO GROWTH 2 DAYS Performed at [facility]    Report Status PENDING  Incomplete  Blood Culture (routine x 2)     Status: None (Preliminary result)   Collection Time: 08/04/15  5:57 PM  Result Value Ref Range Status   Specimen Description BLOOD LEFT HAND  Final   Special Requests BOTTLES DRAWN AEROBIC ONLY 5CC  Final   Culture   Final    NO GROWTH 2 DAYS Performed at [facility]    Report Status PENDING  Incomplete  MRSA PCR Screening     Status: None   Collection Time: 08/04/15 10:52 PM  Result Value Ref Range Status   MRSA by PCR NEGATIVE NEGATIVE Final    Comment:        The GeneXpert MRSA Assay (FDA approved for NASAL specimens only), is one component of a comprehensive MRSA colonization surveillance program. It is not intended to diagnose MRSA infection nor to guide or monitor treatment for MRSA infections.    Studies/Results: Dg Chest 2 View  08/06/2015  CLINICAL DATA:  Shortness of breath, dyspnea EXAM: CHEST  2 VIEW COMPARISON:  08/04/2015 FINDINGS: There is diffuse bilateral interstitial thickening. There is no focal consolidation. There is no pleural effusion or pneumothorax. The heart and mediastinal contours are unremarkable. The osseous structures are unremarkable. IMPRESSION: Diffuse bilateral interstitial thickening  which may reflect interstitial edema versus interstitial infection. Electronically Signed   By: Elige Ko   On: 08/06/2015 09:31   Nm Pulmonary Perf And Vent  08/06/2015  CLINICAL DATA:  Shortness of breath.  Evaluate for pulmonary emboli. EXAM: NUCLEAR MEDICINE VENTILATION - PERFUSION LUNG SCAN TECHNIQUE: Ventilation images were obtained in multiple projections using inhaled aerosol Tc-46m DTPA. Perfusion images were obtained in multiple projections after intravenous injection of Tc-8m MAA. RADIOPHARMACEUTICALS:  4.4 mCi Technetium-63m DTPA aerosol inhalation and 31 mCi Technetium-41m MAA IV COMPARISON:  Chest x-ray from earlier today FINDINGS: There is decreased ventilation to the left upper lung relative to  perfusion. No suspicious perfusion defects are identified. IMPRESSION: Very low probability V/Q scan. Electronically Signed   By: Gerome Sam III M.D   On: 08/06/2015 19:09   Mr Abd W/wo Cm/mrcp  08/07/2015  CLINICAL DATA:  70 year old female with history of left upper quadrant abdominal pain since Saturday 08/04/2015. Some vomiting. EXAM: MRI ABDOMEN WITHOUT AND WITH CONTRAST (INCLUDING MRCP) TECHNIQUE: Multiplanar multisequence MR imaging of the abdomen was performed both before and after the administration of intravenous contrast. Heavily T2-weighted images of the biliary and pancreatic ducts were obtained, and three-dimensional MRCP images were rendered by post processing. CONTRAST:  20mL MULTIHANCE GADOBENATE DIMEGLUMINE 529 MG/ML IV SOLN COMPARISON:  No prior abdominal MRI. CT the abdomen and pelvis 08/04/2015. FINDINGS: Lower chest: There is a suggestion of a mass lesion in the right mid to upper lung (image 1 of series 15) which is incompletely visualized but appears to measure approximately 2.5 x 3.5 cm, and is also visualized on postcontrast imaging (example on image 1 of series 1604). However, this lesion is favored to be artifactual related to phase wrap artifact, as this area is  covered on coronal post gadolinium imaging and no nodule or mass is identified. Linear areas of signal intensity are noted in the lung bases bilaterally, favored to reflect areas of subsegmental atelectasis and/or scarring. Trace right pleural effusion lying dependently. Hepatobiliary: Diffuse heterogeneous loss of signal intensity throughout the hepatic parenchyma on out of phase dual echo images, compatible with hepatic steatosis. No suspicious cystic or solid hepatic lesions. All filling defects are noted within the gallbladder, compatible with gallstones. Gallbladder is moderately distended. Trace amount of pericholecystic fluid. Gallbladder wall thickness is slightly increased at 4 mm. MRCP images demonstrate no intra or extrahepatic biliary ductal dilatation. Additionally, there is no filling defect in the common bile duct to suggest choledocholithiasis. Common bile duct is normal in caliber measuring 4 mm in the porta hepatis. Pancreas: No pancreatic mass. No pancreatic ductal dilatation on MRCP images. No pancreatic or peripancreatic fluid or inflammatory changes. Spleen: Unremarkable. Adrenals/Urinary Tract: Bilateral adrenal glands are normal in appearance. In the kidneys bilaterally there are lesions that are low T1 signal intensity, high T2 signal intensity, and do not enhance, compatible with simple cysts, the largest of which is exophytic measuring 9.2 cm in the upper pole of the left kidney. No suspicious renal lesions. No hydroureteronephrosis in the visualized abdomen. Stomach/Bowel: Visualized portions are unremarkable. Vascular/Lymphatic: No aneurysm identified in the visualized abdominal vasculature. No lymphadenopathy noted in the abdomen. Other: No significant volume of ascites in the visualized peritoneal cavity. Musculoskeletal: No aggressive osseous lesions are identified in the visualized portions of the skeleton. IMPRESSION: 1. Study is positive for cholelithiasis. Additionally, there is  gallbladder distention, mild gallbladder wall thickening and a trace amount of pericholecystic fluid. Clinical correlation for signs and symptoms of acute cholecystitis is recommended. 2. No evidence of choledocholithiasis and no findings to suggest biliary tract obstruction at this time. 3. Some sequences demonstrate an apparent mass-like area of increased signal in the mid to upper right lung, as discussed above, however, other pulse sequences demonstrate no such lesion, suggesting that this could be phase wrap artifact at the upper aspect of the acquired images (related to wrap from lower anatomic structures in the low abdomen or pelvis). However, further evaluation with contrast enhanced chest CT is suggested in the near future to exclude underlying pulmonary malignancy. 4. Heterogeneous hepatic steatosis. 5. Simple cysts in the kidneys bilaterally measuring up to 9.2 cm  in the upper pole of the left kidney. These results will be called to the ordering clinician or representative by the Radiologist Assistant, and communication documented in the PACS or zVision Dashboard. Electronically Signed   By: Trudie Reed M.D.   On: 08/07/2015 16:11   Medications: I have reviewed the patient's current medications. Scheduled Meds: . amLODipine  5 mg Oral Daily  . buPROPion  100 mg Oral TID  . furosemide  60 mg Intravenous BID  . mirabegron ER  50 mg Oral Daily  . multivitamin with minerals  1 tablet Oral Daily  . piperacillin-tazobactam (ZOSYN)  IV  3.375 g Intravenous Q8H  . potassium chloride  10 mEq Intravenous Q1 Hr x 3  . potassium chloride  40 mEq Oral BID  . sodium chloride  1,000 mL Intravenous Once   Continuous Infusions: . heparin 1,900 Units/hr (08/08/15 0748)   PRN Meds:.acetaminophen, ipratropium-albuterol, morphine injection, technetium TC 43M diethylenetriame-pentaacetic acid Assessment/Plan: Principal Problem:   Cholecystitis, acute Active Problems:   Hyperlipidemia   Severe  obesity (BMI >= 40) (HCC)   Major depression, chronic (HCC)   OSA (obstructive sleep apnea)   HTN (hypertension)   GERD   OA (osteoarthritis) of knee   PULMONARY EMBOLISM, HX OF   Antiphospholipid syndrome (HCC)   Pulmonary hypertension (HCC)   Urge incontinence   Cholelithiasis with cholecystitis   Elevated LFTs   Chronic saddle pulmonary embolism with acute cor pulmonale (HCC)   Essential hypertension   Obesity hypoventilation syndrome (HCC)   Dyspnea   Acute respiratory failure with hypoxia (HCC)   Other fluid overload  [name] is a 70 yo F with a PMHx of diverticulosis, Hep B, PE, HTN, OSA, HLD, CKD presenting to the hospital complaining of right sided abdominal pain.  Pulmonary Edema, improving: Dyspnea likely iatrogenic volume overload due to sepsis protocol fluids.  Net neutral yesterday with Lasix x1.  Will continue IV diuresis and K supplementation, as we continue to wean back to room air.  Patient's respiratory status currently appears stable enough to proceed with surgery.  MRCP noted a "suggestion of mass lesion" thought to be artifact, but CT chest with contrast was recommended to exclude malignancy.  Given her relatively aggressive diuresis and probable artifactual nature of the lesion, will defer CT scan until outpatient follow up. - Lasix 60 mg IV once - I/Os - Wean to RA - CT chest with contrast post-discharge  Cholecystitis and Cholangitis, improving: AST/ALT, TBili, and WBC peaked and trending down.  MRCP shows gallstones without obstructing stones.  GI recommending IOC.  Patient's heparin should be stopped 6 hours prior to surgery, or per surgical team.  Due to her past large PE, it should be restarted 12 hours after surgery if no complications during the procedure and low risk of bleeding. - Surgery following, appreciate recs.  - GI consult, appreciate rec's - Zosyn  BCx NGTD  Asymptomatic bacteriuria: >100,000 colonies E coli, sensitive to Zosyn - Zosyn  as above  History of PE and Antiphospholipid: On Xarelto at home. - Hold Xarelto in setting of probably surgery - Heparin gtt   HTN: Amlodipine 5 mg daily at home. BP stable.  - HOLD PO meds  HLD: Pravastatin 40 mg daily at home - HOLD PO meds  Depression: Wellbutrin 100 mg TID at home.  - HOLD PO meds  Overactive bladder: Mirabegron at home - HOLD PO meds   DVT ppx: Heparin  Diet: clear liquids  Code: Full (confimed with patient)  Dispo: Disposition is deferred at this time, awaiting improvement of current medical problems.  Anticipated discharge in approximately 3-5 day(s).   The patient does have a current PCP [name], MD) and does need an [facility] hospital follow-up appointment after discharge.  The patient does not have transportation limitations that hinder transportation to clinic appointments.  .Services Needed at time of discharge: Y = Yes, Blank = No PT:   OT:   RN:   Equipment:   Other:     LOS: 4 days   Patricia Half, MD 08/08/2015, 7:58 AM

## 2015-08-08 NOTE — Discharge Summary (Signed)
Name: [name] MRN: [id number] DOB: 10-28-1945 70 y.o. PCP: [name], MD  Date of Admission: 08/04/2015  2:10 PM Date of Discharge: 08/11/2015 Attending Physician: No att. providers found  Discharge Diagnosis: 1. Acute Cholecystitis and Cholangitis   Principal Problem:   Cholecystitis, acute with cholelithiasis Active Problems:   Hyperlipidemia   Severe obesity (BMI >= 40) (HCC)   Major depression, chronic (HCC)   OSA (obstructive sleep apnea)   HTN (hypertension)   GERD   OA (osteoarthritis) of knee   PULMONARY EMBOLISM, HX OF   Pulmonary hypertension (HCC)   Chronic kidney disease (CKD) stage G2/A1, mildly decreased glomerular filtration rate (GFR) between 60-89 mL/min/1.73 square meter and albuminuria creatinine ratio less than 30 mg/g   Urge incontinence   Obesity hypoventilation syndrome (HCC)   Acute respiratory failure with hypoxia (HCC)   Chronic anticoagulation  Discharge Medications:   Medication List    STOP taking these medications        penicillin v potassium 500 MG tablet  Commonly known as:  VEETID      TAKE these medications        ALPRAZolam 1 MG tablet  Commonly known as:  XANAX  Take 1 mg by mouth at bedtime as needed for sleep.     amLODipine 10 MG tablet  Commonly known as:  NORVASC  Take 0.5 tablets (5 mg total) by mouth daily.     buPROPion 100 MG tablet  Commonly known as:  WELLBUTRIN  Take 100 mg by mouth 3 (three) times daily.     hydrochlorothiazide 25 MG tablet  Commonly known as:  HYDRODIURIL  Take 1 tablet (25 mg total) by mouth daily as needed (edema).     mirabegron ER 50 MG Tb24 tablet  Commonly known as:  MYRBETRIQ  Take 1 tablet (50 mg total) by mouth daily.     multivitamin tablet  Take 1 tablet by mouth daily.     oxyCODONE-acetaminophen 5-325 MG tablet  Commonly known as:  PERCOCET/ROXICET  Take 1-2 tablets by mouth every 6 (six) hours as needed for moderate pain.     polyethylene glycol  packet  Commonly known as:  MIRALAX / GLYCOLAX  Take 17 g by mouth daily.     potassium chloride 10 MEQ tablet  Commonly known as:  K-DUR  Take 1 tablet (10 mEq total) by mouth 2 (two) times daily.     pravastatin 40 MG tablet  Commonly known as:  PRAVACHOL  TAKE 1 TABLET EVERY DAY     senna-docusate 8.6-50 MG tablet  Commonly known as:  Senokot-S  Take 2 tablets by mouth at bedtime as needed (constipation).     XARELTO 20 MG Tabs tablet  Generic drug:  rivaroxaban  TAKE 1 TABLET EVERY DAY WITH SUPPER        Disposition and follow-up:   Ms.Karletta T Hillegass was discharged from [facility] in Stable condition.  At the hospital follow up visit please address:  1.  Healing of cholecystectomy, respiratory and volume status, CT chest  2.  Labs / imaging needed at time of follow-up: CT chest with contrast  3.  Pending labs/ test needing follow-up: none  Follow-up Appointments: Follow-up Information    Follow up with [facility].   Why:  home health PT arranged   Contact information:   [address] [place] [address] [phone]       Discharge Instructions: Discharge Instructions  Call MD for:  difficulty breathing, headache or visual disturbances    Complete by:  As directed      Call MD for:  persistant nausea and vomiting    Complete by:  As directed      Call MD for:  severe uncontrolled pain    Complete by:  As directed      Discharge instructions    Complete by:  As directed   For constipation, please take MiraLAX daily and Senokot S 2 tablets at bedtime to ensure you are moving your bowel.  We will work on scheduling a follow-up appointment with you.     Increase activity slowly    Complete by:  As directed            Consultations: Treatment Team:  Md Ccs, MD  Procedures Performed:  Dg Chest 2 View  08/06/2015  CLINICAL DATA:  Shortness of breath, dyspnea EXAM: CHEST  2 VIEW COMPARISON:  08/04/2015  FINDINGS: There is diffuse bilateral interstitial thickening. There is no focal consolidation. There is no pleural effusion or pneumothorax. The heart and mediastinal contours are unremarkable. The osseous structures are unremarkable. IMPRESSION: Diffuse bilateral interstitial thickening which may reflect interstitial edema versus interstitial infection. Electronically Signed   By: Elige KoHetal  Patel   On: 08/06/2015 09:31   Dg Cholangiogram Operative  08/09/2015  CLINICAL DATA:  Cholelithiasis, acute cholecystitis EXAM: INTRAOPERATIVE CHOLANGIOGRAM TECHNIQUE: Cholangiographic images from the C-arm fluoroscopic device were submitted for interpretation post-operatively. Please see the procedural report for the amount of contrast and the fluoroscopy time utilized. COMPARISON:  MR 08/07/2015 FINDINGS: No persistent filling defects in the common duct. Intrahepatic ducts are incompletely visualized, appearing decompressed centrally. Contrast passes into the duodenum. : Negative for retained common duct stone. Electronically Signed   By: Corlis Leak  Hassell M.D.   On: 08/09/2015 13:49   [place] Abdomen Complete  08/04/2015  CLINICAL DATA:  70 year old female with diffuse abdominal pain for 1 day. EXAM: ABDOMEN ULTRASOUND COMPLETE COMPARISON:  08/04/2015 CT FINDINGS: Gallbladder: A 1.6 cm nonmobile gallstone is identified at the neck. Mild gallbladder wall thickening is noted. There is no evidence of pericholecystic fluid or definite sonographic Murphy's sign. Common bile duct: Diameter: 6 mm. No definite intrahepatic biliary dilatation. Liver: Heterogeneous echotexture. The liver is difficult to visualize secondary to body habitus. IVC: Not well visualized Pancreas: Not well visualized Spleen: Size and appearance within normal limits. Right Kidney: Length: 10.1 cm. Echogenicity within normal limits. No mass or hydronephrosis visualized. Left Kidney: Length: 17.1 cm. A 9 cm upper pole cyst is again identified. Echogenicity within normal  limits. No mass or hydronephrosis visualized. Abdominal aorta: Not well visualized Other findings: No free fluid IMPRESSION: 1.6 cm nonmobile gallstone at the neck with mild gallbladder wall thickening -equivocal for acute cholecystitis. Liver, IVC, pancreas and abdominal aorta not well visualized secondary to body habitus. Electronically Signed   By: Harmon PierJeffrey  Hu M.D.   On: 08/04/2015 18:33   Ct Abdomen Pelvis W Contrast  08/04/2015  CLINICAL DATA:  Acute onset vomiting and generalized abdominal pain earlier today. Hepatitis B. Diverticulosis. EXAM: CT ABDOMEN AND PELVIS WITH CONTRAST TECHNIQUE: Multidetector CT imaging of the abdomen and pelvis was performed using the standard protocol following bolus administration of intravenous contrast. CONTRAST:  100mL ISOVUE-300 IOPAMIDOL (ISOVUE-300) INJECTION 61% COMPARISON:  None. FINDINGS: Lower chest: Dependent atelectasis noted in both lung bases. Small hiatal hernia also seen. Hepatobiliary: No liver masses identified. Gallbladder is distended, however there is no evidence of gallbladder wall  thickening or pericholecystic inflammatory changes. There is a possible small stone in the dependent portion the gallbladder. No evidence of biliary ductal dilatation. Pancreas: No mass, inflammatory changes, or other significant abnormality. Spleen: Within normal limits in size and appearance. Adrenals/Urinary Tract: No masses identified. No evidence of hydronephrosis. Large simple left renal cyst seen measuring 9 cm. Stomach/Bowel: No evidence of obstruction, inflammatory process, or abnormal fluid collections. Diverticulosis is seen involving the descending sigmoid colon, however there is no evidence of diverticulitis. Normal appendix visualized. Vascular/Lymphatic: No pathologically enlarged lymph nodes. No evidence of abdominal aortic aneurysm. Reproductive: No mass or other significant abnormality. Other: None. Musculoskeletal:  No suspicious bone lesions identified.  IMPRESSION: Distended gallbladder, with possible small gallstone. No radiographic evidence of acute cholecystitis or biliary dilatation. Recommend clinical correlation, and consider abdominal ultrasound for further evaluation if warranted. Colonic diverticulosis. No radiographic evidence of diverticulitis. Small hiatal hernia. Dependent atelectasis in both lung bases. Electronically Signed   By: Myles Rosenthal M.D.   On: 08/04/2015 16:59   Nm Pulmonary Perf And Vent  08/06/2015  CLINICAL DATA:  Shortness of breath.  Evaluate for pulmonary emboli. EXAM: NUCLEAR MEDICINE VENTILATION - PERFUSION LUNG SCAN TECHNIQUE: Ventilation images were obtained in multiple projections using inhaled aerosol Tc-24m DTPA. Perfusion images were obtained in multiple projections after intravenous injection of Tc-68m MAA. RADIOPHARMACEUTICALS:  4.4 mCi Technetium-36m DTPA aerosol inhalation and 31 mCi Technetium-45m MAA IV COMPARISON:  Chest x-ray from earlier today FINDINGS: There is decreased ventilation to the left upper lung relative to perfusion. No suspicious perfusion defects are identified. IMPRESSION: Very low probability V/Q scan. Electronically Signed   By: Gerome Sam III M.D   On: 08/06/2015 19:09   Dg Chest Port 1 View  08/09/2015  CLINICAL DATA:  Postop foreign body.  Rule out aspiration of tooth. EXAM: PORTABLE CHEST 1 VIEW COMPARISON:  August 06, 2015 FINDINGS: The cardiomediastinal silhouette is stable. Persistent edema is mildly improved. More focal rounded opacity is seen in the right costophrenic angle, incompletely evaluated. No other interval changes or acute abnormalities. IMPRESSION: 1. Cardiomegaly and mild edema. 2. Rounded opacity project over the right costophrenic angle. The study was performed to evaluate for an aspirated tooth. A PA and lateral chest x-ray could better evaluate the right costophrenic angle. Electronically Signed   By: Gerome Sam III M.D   On: 08/09/2015 15:24   Dg Abd Acute  W/chest  08/04/2015  CLINICAL DATA:  Shortness of breath with abdominal pain and vomiting EXAM: DG ABDOMEN ACUTE W/ 1V CHEST COMPARISON:  Chest radiograph October 26, 2013 FINDINGS: PA chest: There is cardiomegaly with pulmonary venous hypertension. No edema or consolidation. No adenopathy. Supine and left lateral decubitus abdomen images: There is fairly diffuse stool throughout the colon. There is no appreciable bowel dilatation or air-fluid level. No free air. No abnormal calcifications. IMPRESSION: Evidence of pulmonary vascular congestion without frank edema or consolidation. There is fairly diffuse stool throughout colon. No bowel obstruction or free air evident. Electronically Signed   By: Bretta Bang III M.D.   On: 08/04/2015 15:39   Mr Roe Coombs W/wo Cm/mrcp  08/07/2015  CLINICAL DATA:  70 year old female with history of left upper quadrant abdominal pain since Saturday 08/04/2015. Some vomiting. EXAM: MRI ABDOMEN WITHOUT AND WITH CONTRAST (INCLUDING MRCP) TECHNIQUE: Multiplanar multisequence MR imaging of the abdomen was performed both before and after the administration of intravenous contrast. Heavily T2-weighted images of the biliary and pancreatic ducts were obtained, and three-dimensional MRCP images were rendered by  post processing. CONTRAST:  20mL MULTIHANCE GADOBENATE DIMEGLUMINE 529 MG/ML IV SOLN COMPARISON:  No prior abdominal MRI. CT the abdomen and pelvis 08/04/2015. FINDINGS: Lower chest: There is a suggestion of a mass lesion in the right mid to upper lung (image 1 of series 15) which is incompletely visualized but appears to measure approximately 2.5 x 3.5 cm, and is also visualized on postcontrast imaging (example on image 1 of series 1604). However, this lesion is favored to be artifactual related to phase wrap artifact, as this area is covered on coronal post gadolinium imaging and no nodule or mass is identified. Linear areas of signal intensity are noted in the lung bases bilaterally,  favored to reflect areas of subsegmental atelectasis and/or scarring. Trace right pleural effusion lying dependently. Hepatobiliary: Diffuse heterogeneous loss of signal intensity throughout the hepatic parenchyma on out of phase dual echo images, compatible with hepatic steatosis. No suspicious cystic or solid hepatic lesions. All filling defects are noted within the gallbladder, compatible with gallstones. Gallbladder is moderately distended. Trace amount of pericholecystic fluid. Gallbladder wall thickness is slightly increased at 4 mm. MRCP images demonstrate no intra or extrahepatic biliary ductal dilatation. Additionally, there is no filling defect in the common bile duct to suggest choledocholithiasis. Common bile duct is normal in caliber measuring 4 mm in the porta hepatis. Pancreas: No pancreatic mass. No pancreatic ductal dilatation on MRCP images. No pancreatic or peripancreatic fluid or inflammatory changes. Spleen: Unremarkable. Adrenals/Urinary Tract: Bilateral adrenal glands are normal in appearance. In the kidneys bilaterally there are lesions that are low T1 signal intensity, high T2 signal intensity, and do not enhance, compatible with simple cysts, the largest of which is exophytic measuring 9.2 cm in the upper pole of the left kidney. No suspicious renal lesions. No hydroureteronephrosis in the visualized abdomen. Stomach/Bowel: Visualized portions are unremarkable. Vascular/Lymphatic: No aneurysm identified in the visualized abdominal vasculature. No lymphadenopathy noted in the abdomen. Other: No significant volume of ascites in the visualized peritoneal cavity. Musculoskeletal: No aggressive osseous lesions are identified in the visualized portions of the skeleton. IMPRESSION: 1. Study is positive for cholelithiasis. Additionally, there is gallbladder distention, mild gallbladder wall thickening and a trace amount of pericholecystic fluid. Clinical correlation for signs and symptoms of acute  cholecystitis is recommended. 2. No evidence of choledocholithiasis and no findings to suggest biliary tract obstruction at this time. 3. Some sequences demonstrate an apparent mass-like area of increased signal in the mid to upper right lung, as discussed above, however, other pulse sequences demonstrate no such lesion, suggesting that this could be phase wrap artifact at the upper aspect of the acquired images (related to wrap from lower anatomic structures in the low abdomen or pelvis). However, further evaluation with contrast enhanced chest CT is suggested in the near future to exclude underlying pulmonary malignancy. 4. Heterogeneous hepatic steatosis. 5. Simple cysts in the kidneys bilaterally measuring up to 9.2 cm in the upper pole of the left kidney. These results will be called to the ordering clinician or representative by the Radiologist Assistant, and communication documented in the PACS or zVision Dashboard. Electronically Signed   By: Trudie Reed M.D.   On: 08/07/2015 16:11    2D Echo:   Cardiac Cath:   Admission HPI: Patient is a 70 yo F with a PMHx of diverticulosis, Hep B, PE, HTN, OSA, HLD, CKD presenting to the hospital complaining of R sided abdominal pain. States the pain started at 4 am in the morning and woke up her  up from her sleep because it was "paralyzing." States she vomited the ice-cream she had for dinner and called EMS. Denies any prior history of abdominal pain or abdominal surgeries. Denies having any fevers or chills. Denies having any CP or SOB. Denies having any hematemesis or hematochezia. Reports having a history of urinary incontinence but is not complaining of any dysuria or flank pain.  Patient was transferred here from [facility] ED. She was given IVF there as her lactic acid was initially elevated. Also given IV antibiotics as her abdominal CT was concerning for cholecystitis and a subsequent ultrasound confirmed the diagnosis. Repeat lactic acid was more  elevated. [facility] ED physician spoke to general surgery and patient was transferred to [facility] Upon admission here, patient was found to be febrile (T 103.32F), tachycardic (HR 100s-120s), and tachypnic (RR 20s-30s).   Hospital Course by problem list: Principal Problem:   Cholecystitis, acute with cholelithiasis Active Problems:   Hyperlipidemia   Severe obesity (BMI >= 40) (HCC)   Major depression, chronic (HCC)   OSA (obstructive sleep apnea)   HTN (hypertension)   GERD   OA (osteoarthritis) of knee   PULMONARY EMBOLISM, HX OF   Pulmonary hypertension (HCC)   Chronic kidney disease (CKD) stage G2/A1, mildly decreased glomerular filtration rate (GFR) between 60-89 mL/min/1.73 square meter and albuminuria creatinine ratio less than 30 mg/g   Urge incontinence   Obesity hypoventilation syndrome (HCC)   Acute respiratory failure with hypoxia (HCC)   Chronic anticoagulation   Cholecystitis and Cholangitis: Patient presented with RUQ pain, fever, elevated AST/ALT/TBili and WBC, and CT and [place] evidence of acute cholecystitis.  Patient was made NPO and started on Zosyn.  Her labs peaked on hospital day 2-3 and trended down.  Prior to surgery, an MRCP was negative for obstructing stone but confirmed cholelithiasis.  She underwent surgery on 6/8.  Antibiotics were continued for 48 hours after surgery.  Patient tolerated diet and was discharged on her home medications  Pulmonary Edema: Patient was given ~6L NS and started on Vancomycin and Zosyn in the ED per sepsis protocol.  She then began to experience worsening dyspnea eventually requiring 15L Venti mask at 50% O2 to maintain O2 sats > 88%.  With her history of PE, V/Q scan was obtained and resulted as very low probability of PE. She was maintained on anticoagulation throughout admission with IV Heparin.  She was diureses with Lasix 60 mg IV over 3 days, with improvement in her respiratory status.  She was weaned to RA and Xarelto was restarted at  discharge.  Discharge Vitals:   BP 158/81 mmHg  Pulse 89  Temp(Src) 99.1 F (37.3 C) (Oral)  Resp 18  Ht 5\' 7"  (1.702 m)  Wt 370 lb (167.831 kg)  BMI 57.94 kg/m2  SpO2 90%  Discharge Labs:  No results found for this or any previous visit (from the past 24 hour(s)).  Signed: Jana Half, MD 08/13/2015, 6:49 AM    Services Ordered on Discharge: none Equipment Ordered on Discharge: none

## 2015-08-08 NOTE — Clinical Social Work Note (Signed)
CSW acknowledges consult for advanced directives. Will notify chaplain.  CSW signing off. Consult if any other social work needs arise.  Charlynn CourtSarah Jaqlyn Gruenhagen, CSW [phone]

## 2015-08-08 NOTE — Progress Notes (Signed)
Patient ID: [name], female   DOB: 1945/08/18, 70 y.o.   MRN: [id number] Medicine attending: I examined this patient today together with resident physician Dr. Silva BandyNicholas Taylor Anaya test to the accuracy of his evaluation and management plan which we discussed together. Her respiratory status has stabilized significantly. Lungs are clear. No respiratory distress at rest. Oxygen titrated down to 2 L nasal cannula with saturation currently 93%. She remains afebrile on Unasyn. Liver functions improving. Bilirubin now falling. 3.8 today. White count has normalized MRCP shows no obstructing stones. Incidentally noted is a typical mass versus artifact right mid-upper lung. This can be followed at an interval after the patient has her gallbladder removed and is otherwise clinically stable. She is medically cleared to proceed with cholecystectomy. Please note: In view of massive, unprovoked, pulmonary emboli 2 years ago she needs tight anticoagulation around the surgery. Recommend stopping unfractionated heparin 6 hours preop and if the surgery goes smoothly and wound dressings are dry then resume full dose heparin 12 hours postop.          Cephas DarbyJames Granfortuna, MD, FACP  Hematology-Oncology/Internal Medicine

## 2015-08-08 NOTE — Progress Notes (Signed)
Pt refuse NIV at this time, pt stated that she just wants to rest tonight.

## 2015-08-08 NOTE — Progress Notes (Signed)
ANTICOAGULATION CONSULT NOTE - Follow Up Consult  Pharmacy Consult for heparin Indication: h/o PE  Allergies  Allergen Reactions  . Lisinopril     REACTION: PT with cough    Patient Measurements: Height: 5\' 7"  (170.2 cm) Weight: (!) 385 lb 2.3 oz (174.7 kg) (pt refused removal of extra pillows for proper weighing) IBW/kg (Calculated) : 61.6 Heparin Dosing Weight: 105kg  Vital Signs: Temp: 98 F (36.7 C) (06/07 0748) Temp Source: Oral (06/07 0748) BP: 116/80 mmHg (06/07 0749) Pulse Rate: 78 (06/07 0749)  Labs:  Recent Labs  08/06/15 0403 08/06/15 1031 08/07/15 0411 08/08/15 0407  HGB 13.0  --  13.1 13.4  HCT 41.1  --  40.5 41.2  PLT 168  --  163 184  APTT 68* 69* 68*  --   HEPARINUNFRC 0.42  --  0.36 0.39  CREATININE 1.26*  --  1.01* 1.06*    Estimated Creatinine Clearance: 85.6 mL/min (by C-G formula based on Cr of 1.06).   Scheduled:  . amLODipine  5 mg Oral Daily  . buPROPion  100 mg Oral TID  . furosemide  60 mg Intravenous BID  . mirabegron ER  50 mg Oral Daily  . multivitamin with minerals  1 tablet Oral Daily  . piperacillin-tazobactam (ZOSYN)  IV  3.375 g Intravenous Q8H  . potassium chloride  10 mEq Intravenous Q1 Hr x 3  . potassium chloride  40 mEq Oral BID  . sodium chloride  1,000 mL Intravenous Once   Infusions:  . heparin 1,900 Units/hr (08/08/15 0748)    Assessment: 70yo female c/o abdominal pain and emesis, admitted w/ probable cholecystitis, to be evaluated by surgeon, to begin heparin bridge while Xarelto on hold for h/o PE; last dose of Xarelto taken 6/2 at 1230.  Heparin level is therapeutic at 0.39. Plan is for surgery tomorrow. 5 day xarelto washout will be complete tomorrow.   Goal of Therapy:  Heparin level 0.3-0.7 units/ml aPTT 66-102 seconds Monitor platelets by anticoagulation protocol: Yes   Plan:   Continue heparin 1900 units/hr Heparin to be stopped 6/8 at 0400 in preparation for surgery-order end time entered Follow  up restart heparin/DOAC Daily HL, CBC   Thank you for allowing us to participate in this patients care. Signe [name], PharmD Pager: [phone] 08/08/2015 8:36 AM

## 2015-08-09 ENCOUNTER — Encounter (HOSPITAL_COMMUNITY): Payer: Self-pay | Admitting: Surgery

## 2015-08-09 ENCOUNTER — Inpatient Hospital Stay (HOSPITAL_COMMUNITY): Payer: Commercial Managed Care - HMO

## 2015-08-09 ENCOUNTER — Encounter (HOSPITAL_COMMUNITY)
Admission: EM | Disposition: A | Discharge status: Home / Self Care | Payer: Self-pay | Source: Home / Self Care | Attending: Oncology

## 2015-08-09 ENCOUNTER — Inpatient Hospital Stay (HOSPITAL_COMMUNITY): Payer: Commercial Managed Care - HMO | Admitting: Certified Registered Nurse Anesthetist

## 2015-08-09 HISTORY — PX: CHOLECYSTECTOMY: SHX55

## 2015-08-09 LAB — COMPREHENSIVE METABOLIC PANEL
ALT: 123 U/L — AB (ref 14–54)
AST: 134 U/L — AB (ref 15–41)
Albumin: 2.9 g/dL — ABNORMAL LOW (ref 3.5–5.0)
Alkaline Phosphatase: 159 U/L — ABNORMAL HIGH (ref 38–126)
Anion gap: 10 (ref 5–15)
BUN: 9 mg/dL (ref 6–20)
CHLORIDE: 99 mmol/L — AB (ref 101–111)
CO2: 25 mmol/L (ref 22–32)
CREATININE: 0.99 mg/dL (ref 0.44–1.00)
Calcium: 9 mg/dL (ref 8.9–10.3)
GFR calc Af Amer: >60 mL/min (ref >60)
GFR calc non Af Amer: 57 mL/min — ABNORMAL LOW (ref >60)
GLUCOSE: 93 mg/dL (ref 65–99)
Potassium: 4.5 mmol/L (ref 3.5–5.1)
SODIUM: 134 mmol/L — AB (ref 135–145)
Total Bilirubin: 3.1 mg/dL — ABNORMAL HIGH (ref 0.3–1.2)
Total Protein: 7.1 g/dL (ref 6.5–8.1)

## 2015-08-09 LAB — CBC WITH DIFFERENTIAL/PLATELET
BASOS ABS: 0 10*3/uL (ref 0.0–0.1)
Basophils Relative: 0 %
EOS ABS: 0.2 10*3/uL (ref 0.0–0.7)
Eosinophils Relative: 2 %
HCT: 40 % (ref 36.0–46.0)
HEMOGLOBIN: 13 g/dL (ref 12.0–15.0)
LYMPHS ABS: 1.6 10*3/uL (ref 0.7–4.0)
Lymphocytes Relative: 24 %
MCH: 25.8 pg — ABNORMAL LOW (ref 26.0–34.0)
MCHC: 32.5 g/dL (ref 30.0–36.0)
MCV: 79.4 fL (ref 78.0–100.0)
Monocytes Absolute: 0.5 10*3/uL (ref 0.1–1.0)
Monocytes Relative: 7 %
Neutro Abs: 4.5 10*3/uL (ref 1.7–7.7)
Neutrophils Relative %: 67 %
PLATELETS: 210 10*3/uL (ref 150–400)
RBC: 5.04 MIL/uL (ref 3.87–5.11)
RDW: 14.9 % (ref 11.5–15.5)
WBC: 6.7 10*3/uL (ref 4.0–10.5)

## 2015-08-09 LAB — HEPARIN LEVEL (UNFRACTIONATED): HEPARIN UNFRACTIONATED: 0.46 [IU]/mL (ref 0.30–0.70)

## 2015-08-09 LAB — GLUCOSE, CAPILLARY: Glucose-Capillary: 118 mg/dL — ABNORMAL HIGH (ref 65–99)

## 2015-08-09 SURGERY — LAPAROSCOPIC CHOLECYSTECTOMY WITH INTRAOPERATIVE CHOLANGIOGRAM
Anesthesia: General | Site: Abdomen

## 2015-08-09 MED ORDER — NEOSTIGMINE METHYLSULFATE 5 MG/5ML IV SOSY
PREFILLED_SYRINGE | INTRAVENOUS | Status: AC
  Filled 2015-08-09: qty 5

## 2015-08-09 MED ORDER — ONDANSETRON HCL 4 MG/2ML IJ SOLN
INTRAMUSCULAR | Status: DC | PRN
  Administered 2015-08-09: 4 mg via INTRAVENOUS

## 2015-08-09 MED ORDER — IOPAMIDOL (ISOVUE-300) INJECTION 61%
INTRAVENOUS | Status: AC
  Filled 2015-08-09: qty 50

## 2015-08-09 MED ORDER — GLYCOPYRROLATE 0.2 MG/ML IV SOSY
PREFILLED_SYRINGE | INTRAVENOUS | Status: AC
  Filled 2015-08-09: qty 3

## 2015-08-09 MED ORDER — PROPOFOL 10 MG/ML IV BOLUS
INTRAVENOUS | Status: DC | PRN
  Administered 2015-08-09: 200 mg via INTRAVENOUS

## 2015-08-09 MED ORDER — SUCCINYLCHOLINE CHLORIDE 200 MG/10ML IV SOSY
PREFILLED_SYRINGE | INTRAVENOUS | Status: AC
  Filled 2015-08-09: qty 10

## 2015-08-09 MED ORDER — MIDAZOLAM HCL 2 MG/2ML IJ SOLN
INTRAMUSCULAR | Status: AC
  Filled 2015-08-09: qty 2

## 2015-08-09 MED ORDER — GLYCOPYRROLATE 0.2 MG/ML IJ SOLN
INTRAMUSCULAR | Status: DC | PRN
  Administered 2015-08-09: 0.2 mg via INTRAVENOUS
  Administered 2015-08-09: .8 mg via INTRAVENOUS

## 2015-08-09 MED ORDER — SODIUM CHLORIDE 0.9 % IV SOLN
INTRAVENOUS | Status: DC | PRN
  Administered 2015-08-09: 60 mL

## 2015-08-09 MED ORDER — METOPROLOL TARTRATE 5 MG/5ML IV SOLN
INTRAVENOUS | Status: AC
  Filled 2015-08-09: qty 5

## 2015-08-09 MED ORDER — SODIUM CHLORIDE 0.9 % IR SOLN
Status: DC | PRN
  Administered 2015-08-09: 1000 mL

## 2015-08-09 MED ORDER — FENTANYL CITRATE (PF) 250 MCG/5ML IJ SOLN
INTRAMUSCULAR | Status: DC | PRN
  Administered 2015-08-09: 100 ug via INTRAVENOUS
  Administered 2015-08-09: 50 ug via INTRAVENOUS
  Administered 2015-08-09: 100 ug via INTRAVENOUS

## 2015-08-09 MED ORDER — ROCURONIUM BROMIDE 100 MG/10ML IV SOLN
INTRAVENOUS | Status: DC | PRN
  Administered 2015-08-09: 50 mg via INTRAVENOUS

## 2015-08-09 MED ORDER — HEPARIN (PORCINE) IN NACL 100-0.45 UNIT/ML-% IJ SOLN
1900.0000 [IU]/h | INTRAMUSCULAR | Status: DC
  Administered 2015-08-10 – 2015-08-11 (×2): 1900 [IU]/h via INTRAVENOUS
  Filled 2015-08-09 (×3): qty 250

## 2015-08-09 MED ORDER — PROPOFOL 10 MG/ML IV BOLUS
INTRAVENOUS | Status: AC
Start: 2015-08-09 — End: 2015-08-09
  Filled 2015-08-09: qty 20

## 2015-08-09 MED ORDER — METOPROLOL TARTARATE 1 MG/ML SYRINGE (5ML)
Status: DC | PRN
  Administered 2015-08-09 (×2): 2.5 mg via INTRAVENOUS

## 2015-08-09 MED ORDER — ROCURONIUM BROMIDE 50 MG/5ML IV SOLN
INTRAVENOUS | Status: AC
  Filled 2015-08-09: qty 1

## 2015-08-09 MED ORDER — LIDOCAINE 2% (20 MG/ML) 5 ML SYRINGE
INTRAMUSCULAR | Status: AC
  Filled 2015-08-09: qty 5

## 2015-08-09 MED ORDER — BUPIVACAINE-EPINEPHRINE 0.25% -1:200000 IJ SOLN
INTRAMUSCULAR | Status: DC | PRN
  Administered 2015-08-09: 4 mL

## 2015-08-09 MED ORDER — NEOSTIGMINE METHYLSULFATE 10 MG/10ML IV SOLN
INTRAVENOUS | Status: DC | PRN
  Administered 2015-08-09: 5 mg via INTRAVENOUS

## 2015-08-09 MED ORDER — LACTATED RINGERS IV SOLN
INTRAVENOUS | Status: DC | PRN
Start: 2015-08-09 — End: 2015-08-09
  Administered 2015-08-09 (×2): via INTRAVENOUS

## 2015-08-09 MED ORDER — ARTIFICIAL TEARS OP OINT
TOPICAL_OINTMENT | OPHTHALMIC | Status: AC
  Filled 2015-08-09: qty 3.5

## 2015-08-09 MED ORDER — FENTANYL CITRATE (PF) 250 MCG/5ML IJ SOLN
INTRAMUSCULAR | Status: AC
  Filled 2015-08-09: qty 5

## 2015-08-09 MED ORDER — HYDROMORPHONE HCL 1 MG/ML IJ SOLN: 0.2500 mg | INTRAMUSCULAR | Status: DC | PRN

## 2015-08-09 MED ORDER — METOCLOPRAMIDE HCL 5 MG/ML IJ SOLN: 10.0000 mg | Freq: Once | INTRAMUSCULAR | Status: DC | PRN

## 2015-08-09 MED ORDER — SUCCINYLCHOLINE CHLORIDE 20 MG/ML IJ SOLN
INTRAMUSCULAR | Status: DC | PRN
  Administered 2015-08-09: 150 mg via INTRAVENOUS

## 2015-08-09 MED ORDER — ALPRAZOLAM 0.5 MG PO TABS
1.0000 mg | ORAL_TABLET | Freq: Every evening | ORAL | Status: DC | PRN
  Administered 2015-08-09 – 2015-08-10 (×2): 1 mg via ORAL
  Filled 2015-08-09 (×2): qty 2

## 2015-08-09 MED ORDER — 0.9 % SODIUM CHLORIDE (POUR BTL) OPTIME
TOPICAL | Status: DC | PRN
  Administered 2015-08-09 (×2): 1000 mL

## 2015-08-09 MED ORDER — ONDANSETRON HCL 4 MG/2ML IJ SOLN
INTRAMUSCULAR | Status: AC
  Filled 2015-08-09: qty 2

## 2015-08-09 MED ORDER — PHENYLEPHRINE 40 MCG/ML (10ML) SYRINGE FOR IV PUSH (FOR BLOOD PRESSURE SUPPORT)
PREFILLED_SYRINGE | INTRAVENOUS | Status: AC
  Filled 2015-08-09: qty 10

## 2015-08-09 MED ORDER — PHENYLEPHRINE HCL 10 MG/ML IJ SOLN
INTRAMUSCULAR | Status: DC | PRN
  Administered 2015-08-09: 120 ug via INTRAVENOUS

## 2015-08-09 MED ORDER — OXYCODONE-ACETAMINOPHEN 5-325 MG PO TABS
1.0000 | ORAL_TABLET | ORAL | Status: DC | PRN
  Administered 2015-08-09 – 2015-08-10 (×2): 2 via ORAL
  Filled 2015-08-09 (×2): qty 2

## 2015-08-09 SURGICAL SUPPLY — 53 items
APPLIER CLIP ROT 10 11.4 M/L (STAPLE) ×3
APR CLP MED LRG 11.4X10 (STAPLE) ×1
BAG SPEC RTRVL LRG 6X4 10 (ENDOMECHANICALS) ×1
BLADE SURG ROTATE 9660 (MISCELLANEOUS) IMPLANT
CANISTER SUCTION 2500CC (MISCELLANEOUS) ×3 IMPLANT
CHLORAPREP W/TINT 26ML (MISCELLANEOUS) ×3 IMPLANT
CLIP APPLIE ROT 10 11.4 M/L (STAPLE) ×1 IMPLANT
COVER MAYO STAND STRL (DRAPES) ×1 IMPLANT
COVER SURGICAL LIGHT HANDLE (MISCELLANEOUS) ×3 IMPLANT
DRAPE C-ARM 42X72 X-RAY (DRAPES) ×1 IMPLANT
DRAPE WARM FLUID 44X44 (DRAPE) ×3 IMPLANT
ELECT REM PT RETURN 9FT ADLT (ELECTROSURGICAL) ×3
ELECTRODE REM PT RTRN 9FT ADLT (ELECTROSURGICAL) ×1 IMPLANT
ENDOLOOP SUT PDS II  0 18 (SUTURE) ×2
ENDOLOOP SUT PDS II 0 18 (SUTURE) IMPLANT
GLOVE BIO SURGEON STRL SZ 6.5 (GLOVE) ×2 IMPLANT
GLOVE BIO SURGEON STRL SZ7.5 (GLOVE) ×2 IMPLANT
GLOVE BIO SURGEON STRL SZ8 (GLOVE) ×3 IMPLANT
GLOVE BIO SURGEONS STRL SZ 6.5 (GLOVE) ×2
GLOVE BIOGEL PI IND STRL 6.5 (GLOVE) IMPLANT
GLOVE BIOGEL PI IND STRL 7.0 (GLOVE) IMPLANT
GLOVE BIOGEL PI IND STRL 7.5 (GLOVE) IMPLANT
GLOVE BIOGEL PI IND STRL 8 (GLOVE) ×1 IMPLANT
GLOVE BIOGEL PI INDICATOR 6.5 (GLOVE) ×2
GLOVE BIOGEL PI INDICATOR 7.0 (GLOVE) ×4
GLOVE BIOGEL PI INDICATOR 7.5 (GLOVE) ×2
GLOVE BIOGEL PI INDICATOR 8 (GLOVE) ×6
GLOVE ECLIPSE 7.5 STRL STRAW (GLOVE) ×4 IMPLANT
GLOVE ORTHOPEDIC STR SZ6.5 (GLOVE) ×2 IMPLANT
GOWN STRL REUS W/ TWL LRG LVL3 (GOWN DISPOSABLE) ×2 IMPLANT
GOWN STRL REUS W/ TWL XL LVL3 (GOWN DISPOSABLE) ×1 IMPLANT
GOWN STRL REUS W/TWL LRG LVL3 (GOWN DISPOSABLE) ×6
GOWN STRL REUS W/TWL XL LVL3 (GOWN DISPOSABLE) ×9
HOVERMATT SINGLE USE (MISCELLANEOUS) ×2 IMPLANT
KIT BASIN OR (CUSTOM PROCEDURE TRAY) ×3 IMPLANT
KIT ROOM TURNOVER OR (KITS) ×3 IMPLANT
LIQUID BAND (GAUZE/BANDAGES/DRESSINGS) ×3 IMPLANT
NS IRRIG 1000ML POUR BTL (IV SOLUTION) ×3 IMPLANT
PAD ARMBOARD 7.5X6 YLW CONV (MISCELLANEOUS) ×3 IMPLANT
POUCH SPECIMEN RETRIEVAL 10MM (ENDOMECHANICALS) ×3 IMPLANT
SCISSORS LAP 5X35 DISP (ENDOMECHANICALS) ×3 IMPLANT
SET CHOLANGIOGRAPH 5 50 .035 (SET/KITS/TRAYS/PACK) ×1 IMPLANT
SET IRRIG TUBING LAPAROSCOPIC (IRRIGATION / IRRIGATOR) ×3 IMPLANT
SLEEVE ENDOPATH XCEL 5M (ENDOMECHANICALS) ×3 IMPLANT
SPECIMEN JAR SMALL (MISCELLANEOUS) ×3 IMPLANT
SUT MNCRL AB 4-0 PS2 18 (SUTURE) ×3 IMPLANT
TOWEL OR 17X24 6PK STRL BLUE (TOWEL DISPOSABLE) ×1 IMPLANT
TOWEL OR 17X26 10 PK STRL BLUE (TOWEL DISPOSABLE) ×3 IMPLANT
TRAY LAPAROSCOPIC MC (CUSTOM PROCEDURE TRAY) ×3 IMPLANT
TROCAR XCEL BLUNT TIP 100MML (ENDOMECHANICALS) ×3 IMPLANT
TROCAR XCEL NON-BLD 11X100MML (ENDOMECHANICALS) ×3 IMPLANT
TROCAR XCEL NON-BLD 5MMX100MML (ENDOMECHANICALS) ×3 IMPLANT
TUBING INSUFFLATION (TUBING) ×3 IMPLANT

## 2015-08-09 NOTE — Anesthesia Preprocedure Evaluation (Addendum)
Anesthesia Evaluation  Patient identified by MRN, date of birth, ID band Patient awake    Reviewed: Allergy & Precautions, H&P , NPO status , Patient's Chart, lab work & pertinent test results  History of Anesthesia Complications Negative for: history of anesthetic complications  Airway Mallampati: II  TM Distance: >3 FB Neck ROM: full    Dental no notable dental hx. (+) Missing, Poor Dentition, Loose, Dental Advisory Given,    Pulmonary sleep apnea , former smoker,    Pulmonary exam normal breath sounds clear to auscultation       Cardiovascular hypertension, negative cardio ROS Normal cardiovascular exam Rhythm:regular Rate:Normal     Neuro/Psych Depression negative neurological ROS     GI/Hepatic Neg liver ROS, GERD  ,  Endo/Other  Morbid obesity  Renal/GU      Musculoskeletal  (+) Arthritis ,   Abdominal   Peds  Hematology negative hematology ROS (+)   Anesthesia Other Findings Hx of PE  Reproductive/Obstetrics negative OB ROS                            Anesthesia Physical Anesthesia Plan  ASA: III  Anesthesia Plan: General   Post-op Pain Management:    Induction: Intravenous  Airway Management Planned: Oral ETT  Additional Equipment:   Intra-op Plan:   Post-operative Plan: Extubation in OR  Informed Consent: I have reviewed the patients History and Physical, chart, labs and discussed the procedure including the risks, benefits and alternatives for the proposed anesthesia with the patient or authorized representative who has indicated his/her understanding and acceptance.   Dental Advisory Given  Plan Discussed with: Anesthesiologist, CRNA and Surgeon  Anesthesia Plan Comments:         Anesthesia Quick Evaluation

## 2015-08-09 NOTE — H&P (View-Only) (Signed)
Subjective: She is feeling much better and has no pain.  Breathing stable.  She says she is ready to go.    Objective: Vital signs in last 24 hours: Temp:  [98 F (36.7 C)-98.7 F (37.1 C)] 98 F (36.7 C) (06/07 0748) Pulse Rate:  [73-93] 78 (06/07 0749) Resp:  [17-36] 24 (06/07 0749) BP: (110-139)/(56-84) 116/80 mmHg (06/07 0749) SpO2:  [91 %-96 %] 93 % (06/07 0749) Weight:  [171.913 kg (379 lb)-174.7 kg (385 lb 2.3 oz)] 174.7 kg (385 lb 2.3 oz) (06/07 0422) Last BM Date: 08/04/15 930 Po Urine  2350 Stool x 1 Afebrile, VSS K+ 3.1 Creatinine better 1.06 Normal WBC LFT's improving MRI:  Study is positive for cholelithiasis. Additionally, there is gallbladder distention, mild gallbladder wall thickening and a trace amount of pericholecystic fluid. Clinical correlation for signs and symptoms of acute cholecystitis is recommended.  No evidence of choledocholithiasis and no findings to suggest biliary tract obstruction at this time.  Intake/Output from previous day: 06/06 0701 - 06/07 0700 In: 1831 [P.O.:930; I.V.:551; IV Piggyback:350] Out: 2350 [Urine:2350] Intake/Output this shift: Total I/O In: 53.2 [I.V.:53.2] Out: 200 [Urine:200]  General appearance: alert, cooperative and no distress GI: soft, non-tender; bowel sounds normal; no masses,  no organomegaly  Lab Results:   Recent Labs  08/07/15 0411 08/08/15 0407  WBC 11.0* 7.6  HGB 13.1 13.4  HCT 40.5 41.2  PLT 163 184    BMET  Recent Labs  08/07/15 0411 08/08/15 0407  NA 137 137  K 2.9* 3.1*  CL 96* 96*  CO2 30 28  GLUCOSE 112* 93  BUN 7 7  CREATININE 1.01* 1.06*  CALCIUM 8.3* 8.7*   PT/INR No results for input(s): LABPROT, INR in the last 72 hours.   Recent Labs Lab 08/04/15 1441 08/05/15 0652 08/06/15 0403 08/07/15 0411 08/08/15 0407  AST 360* 248* 134* 86* 91*  ALT 184* 225* 152* 116* 108*  ALKPHOS 124 111 142* 156* 158*  BILITOT 2.3* 5.6* 6.8* 7.0* 3.8*  PROT 8.8* 6.1* 7.1  6.9 7.1  ALBUMIN 4.4 2.8* 3.0* 2.9* 2.7*     Lipase     Component Value Date/Time   LIPASE 14 08/06/2015 1031     Studies/Results: Dg Chest 2 View  08/06/2015  CLINICAL DATA:  Shortness of breath, dyspnea EXAM: CHEST  2 VIEW COMPARISON:  08/04/2015 FINDINGS: There is diffuse bilateral interstitial thickening. There is no focal consolidation. There is no pleural effusion or pneumothorax. The heart and mediastinal contours are unremarkable. The osseous structures are unremarkable. IMPRESSION: Diffuse bilateral interstitial thickening which may reflect interstitial edema versus interstitial infection. Electronically Signed   By: Elige Ko   On: 08/06/2015 09:31   Nm Pulmonary Perf And Vent  08/06/2015  CLINICAL DATA:  Shortness of breath.  Evaluate for pulmonary emboli. EXAM: NUCLEAR MEDICINE VENTILATION - PERFUSION LUNG SCAN TECHNIQUE: Ventilation images were obtained in multiple projections using inhaled aerosol Tc-24m DTPA. Perfusion images were obtained in multiple projections after intravenous injection of Tc-32m MAA. RADIOPHARMACEUTICALS:  4.4 mCi Technetium-54m DTPA aerosol inhalation and 31 mCi Technetium-57m MAA IV COMPARISON:  Chest x-ray from earlier today FINDINGS: There is decreased ventilation to the left upper lung relative to perfusion. No suspicious perfusion defects are identified. IMPRESSION: Very low probability V/Q scan. Electronically Signed   By: Gerome Sam III M.D   On: 08/06/2015 19:09   Mr Abd W/wo Cm/mrcp  08/07/2015  CLINICAL DATA:  70 year old female with history of left upper quadrant abdominal pain since  Saturday 08/04/2015. Some vomiting. EXAM: MRI ABDOMEN WITHOUT AND WITH CONTRAST (INCLUDING MRCP) TECHNIQUE: Multiplanar multisequence MR imaging of the abdomen was performed both before and after the administration of intravenous contrast. Heavily T2-weighted images of the biliary and pancreatic ducts were obtained, and three-dimensional MRCP images were rendered by  post processing. CONTRAST:  20mL MULTIHANCE GADOBENATE DIMEGLUMINE 529 MG/ML IV SOLN COMPARISON:  No prior abdominal MRI. CT the abdomen and pelvis 08/04/2015. FINDINGS: Lower chest: There is a suggestion of a mass lesion in the right mid to upper lung (image 1 of series 15) which is incompletely visualized but appears to measure approximately 2.5 x 3.5 cm, and is also visualized on postcontrast imaging (example on image 1 of series 1604). However, this lesion is favored to be artifactual related to phase wrap artifact, as this area is covered on coronal post gadolinium imaging and no nodule or mass is identified. Linear areas of signal intensity are noted in the lung bases bilaterally, favored to reflect areas of subsegmental atelectasis and/or scarring. Trace right pleural effusion lying dependently. Hepatobiliary: Diffuse heterogeneous loss of signal intensity throughout the hepatic parenchyma on out of phase dual echo images, compatible with hepatic steatosis. No suspicious cystic or solid hepatic lesions. All filling defects are noted within the gallbladder, compatible with gallstones. Gallbladder is moderately distended. Trace amount of pericholecystic fluid. Gallbladder wall thickness is slightly increased at 4 mm. MRCP images demonstrate no intra or extrahepatic biliary ductal dilatation. Additionally, there is no filling defect in the common bile duct to suggest choledocholithiasis. Common bile duct is normal in caliber measuring 4 mm in the porta hepatis. Pancreas: No pancreatic mass. No pancreatic ductal dilatation on MRCP images. No pancreatic or peripancreatic fluid or inflammatory changes. Spleen: Unremarkable. Adrenals/Urinary Tract: Bilateral adrenal glands are normal in appearance. In the kidneys bilaterally there are lesions that are low T1 signal intensity, high T2 signal intensity, and do not enhance, compatible with simple cysts, the largest of which is exophytic measuring 9.2 cm in the upper  pole of the left kidney. No suspicious renal lesions. No hydroureteronephrosis in the visualized abdomen. Stomach/Bowel: Visualized portions are unremarkable. Vascular/Lymphatic: No aneurysm identified in the visualized abdominal vasculature. No lymphadenopathy noted in the abdomen. Other: No significant volume of ascites in the visualized peritoneal cavity. Musculoskeletal: No aggressive osseous lesions are identified in the visualized portions of the skeleton. IMPRESSION: 1. Study is positive for cholelithiasis. Additionally, there is gallbladder distention, mild gallbladder wall thickening and a trace amount of pericholecystic fluid. Clinical correlation for signs and symptoms of acute cholecystitis is recommended. 2. No evidence of choledocholithiasis and no findings to suggest biliary tract obstruction at this time. 3. Some sequences demonstrate an apparent mass-like area of increased signal in the mid to upper right lung, as discussed above, however, other pulse sequences demonstrate no such lesion, suggesting that this could be phase wrap artifact at the upper aspect of the acquired images (related to wrap from lower anatomic structures in the low abdomen or pelvis). However, further evaluation with contrast enhanced chest CT is suggested in the near future to exclude underlying pulmonary malignancy. 4. Heterogeneous hepatic steatosis. 5. Simple cysts in the kidneys bilaterally measuring up to 9.2 cm in the upper pole of the left kidney. These results will be called to the ordering clinician or representative by the Radiologist Assistant, and communication documented in the PACS or zVision Dashboard. Electronically Signed   By: Trudie Reedaniel  Entrikin M.D.   On: 08/07/2015 16:11    Medications: .  amLODipine  5 mg Oral Daily  . buPROPion  100 mg Oral TID  . furosemide  60 mg Intravenous BID  . mirabegron ER  50 mg Oral Daily  . multivitamin with minerals  1 tablet Oral Daily  . piperacillin-tazobactam  (ZOSYN)  IV  3.375 g Intravenous Q8H  . potassium chloride  10 mEq Intravenous Q1 Hr x 3  . potassium chloride  40 mEq Oral BID  . sodium chloride  1,000 mL Intravenous Once   . heparin 1,900 Units/hr (08/08/15 0748)   Assessment/Plan   Acute cholecystitis/cholelithiasis Elevated LFT's - improving On O2 - stable   Morbid obesity Body mass index is 59.43  Pulmonary Emboli on Xarelto (last dose 08/13/15 @ 12:30 PM) OSA on CPAP Chronic renal insuffiencey - stable Hypokalemia - being replaced Hepatitis B Limited mobility uses a walker  Depression Arthritis  Hypertension FEN: NPO/ ID: Day 5 Zosyn, one dose of vancomycin on admit VTE: Heparin drip  Plan:  If clear by Medicine we will plan surgery tomorrow. I will put her back on clears for now.  I have ask pharmacy to hold heparin at 4 AM for surgery later in the morning.    LOS: 4 days    [name] 08/08/2015 [phone]

## 2015-08-09 NOTE — Progress Notes (Signed)
Subjective: [name]  Patient denies abdominal pain unless her abdomen is being palpated.  Breathing continues to improve.  Objective: Vital signs in last 24 hours: Filed Vitals:   08/08/15 1935 08/08/15 2323 08/09/15 0355 08/09/15 0405  BP: 125/91 137/87 132/77   Pulse: 86 79 81   Temp: 98.7 F (37.1 C) 98.3 F (36.8 C) 99 F (37.2 C)   TempSrc: Oral Oral Axillary   Resp: 20 17 20    Height:      Weight:    381 lb 9.9 oz (173.1 kg)  SpO2: 93% 97% 95%    Weight change: -3 lb 8.4 oz (-1.6 kg)  Intake/Output Summary (Last 24 hours) at 08/09/15 0717 Last data filed at 08/09/15 0355  Gross per 24 hour  Intake 1214.78 ml  Output   2125 ml  Net -910.22 ml   Physical Exam  Constitutional: She is oriented to person, place, and time.  Morbidly obese female, lying in bed, NAD.  HENT:  Head: Normocephalic and atraumatic.  Eyes: EOM are normal. No scleral icterus.  Cardiovascular: Normal rate, regular rhythm and normal heart sounds.   Heart sounds distant, but no murmurs or gallops appreciated.  Pulmonary/Chest: Effort normal. No stridor. No respiratory distress. She has no rales.  Mild crackles in bilateral bases.  Abdominal: Soft. She exhibits no distension. There is no tenderness. There is no rebound and no guarding.  Minimal tenderness to deep palpation in RUQ.  Musculoskeletal:  Trace pitting edema to midshin.  Neurological: She is alert and oriented to person, place, and time.  Skin: Skin is warm and dry.    Lab Results: Basic Metabolic Panel:  Recent Labs Lab 08/08/15 0407 08/09/15 0347  NA 137 134*  K 3.1* 4.5  CL 96* 99*  CO2 28 25  GLUCOSE 93 93  BUN 7 9  CREATININE 1.06* 0.99  CALCIUM 8.7* 9.0  MG 2.1  --    Liver Function Tests:  Recent Labs Lab 08/08/15 0407 08/09/15 0347  AST 91* 134*  ALT 108* 123*  ALKPHOS 158* 159*  BILITOT 3.8* 3.1*  PROT 7.1 7.1  ALBUMIN 2.7* 2.9*    Recent Labs Lab 08/04/15 1441 08/06/15 1031  LIPASE 24 14    No results for input(s): AMMONIA in the last 168 hours. CBC:  Recent Labs Lab 08/08/15 0407 08/09/15 0347  WBC 7.6 6.7  NEUTROABS 5.6 4.5  HGB 13.4 13.0  HCT 41.2 40.0  MCV 79.8 79.4  PLT 184 210   Cardiac Enzymes:  Recent Labs Lab 08/04/15 1452  TROPONINI <0.03   BNP: No results for input(s): PROBNP in the last 168 hours. D-Dimer: No results for input(s): DDIMER in the last 168 hours. CBG: No results for input(s): GLUCAP in the last 168 hours. Hemoglobin A1C: No results for input(s): HGBA1C in the last 168 hours. Fasting Lipid Panel: No results for input(s): CHOL, HDL, LDLCALC, TRIG, CHOLHDL, LDLDIRECT in the last 168 hours. Thyroid Function Tests: No results for input(s): TSH, T4TOTAL, FREET4, T3FREE, THYROIDAB in the last 168 hours. Coagulation: No results for input(s): LABPROT, INR in the last 168 hours. Anemia Panel: No results for input(s): VITAMINB12, FOLATE, FERRITIN, TIBC, IRON, RETICCTPCT in the last 168 hours. Urine Drug Screen: Drugs of Abuse  No results found for: LABOPIA, COCAINSCRNUR, LABBENZ, AMPHETMU, THCU, LABBARB  Alcohol Level: No results for input(s): ETH in the last 168 hours. Urinalysis:  Recent Labs Lab 08/04/15 1700  COLORURINE AMBER*  LABSPEC 1.042*  PHURINE 6.0  GLUCOSEU NEGATIVE  HGBUR NEGATIVE  BILIRUBINUR SMALL*  KETONESUR NEGATIVE  PROTEINUR 30*  NITRITE POSITIVE*  LEUKOCYTESUR NEGATIVE   Misc. Labs:   Micro Results: Recent Results (from the past 240 hour(s))  Urine culture     Status: Abnormal   Collection Time: 08/04/15  5:00 PM  Result Value Ref Range Status   Specimen Description URINE, CATHETERIZED  Final   Special Requests NONE  Final   Culture >=100,000 COLONIES/mL ESCHERICHIA COLI (A)  Final   Report Status 08/06/2015 FINAL  Final   Organism ID, Bacteria ESCHERICHIA COLI (A)  Final      Susceptibility   Escherichia coli - MIC*    AMPICILLIN >=32 RESISTANT Resistant     CEFAZOLIN <=4 SENSITIVE  Sensitive     CEFTRIAXONE <=1 SENSITIVE Sensitive     CIPROFLOXACIN <=0.25 SENSITIVE Sensitive     GENTAMICIN <=1 SENSITIVE Sensitive     IMIPENEM <=0.25 SENSITIVE Sensitive     NITROFURANTOIN <=16 SENSITIVE Sensitive     TRIMETH/SULFA <=20 SENSITIVE Sensitive     AMPICILLIN/SULBACTAM 16 INTERMEDIATE Intermediate     PIP/TAZO <=4 SENSITIVE Sensitive     * >=100,000 COLONIES/mL ESCHERICHIA COLI  Blood Culture (routine x 2)     Status: None (Preliminary result)   Collection Time: 08/04/15  5:50 PM  Result Value Ref Range Status   Specimen Description BLOOD LEFT HAND  Final   Special Requests IN PEDIATRIC BOTTLE 1CC  Final   Culture   Final    NO GROWTH 3 DAYS Performed at [facility]    Report Status PENDING  Incomplete  Blood Culture (routine x 2)     Status: None (Preliminary result)   Collection Time: 08/04/15  5:57 PM  Result Value Ref Range Status   Specimen Description BLOOD LEFT HAND  Final   Special Requests BOTTLES DRAWN AEROBIC ONLY 5CC  Final   Culture   Final    NO GROWTH 3 DAYS Performed at [facility]    Report Status PENDING  Incomplete  MRSA PCR Screening     Status: None   Collection Time: 08/04/15 10:52 PM  Result Value Ref Range Status   MRSA by PCR NEGATIVE NEGATIVE Final    Comment:        The GeneXpert MRSA Assay (FDA approved for NASAL specimens only), is one component of a comprehensive MRSA colonization surveillance program. It is not intended to diagnose MRSA infection nor to guide or monitor treatment for MRSA infections.    Studies/Results: Mr [name] W/wo Cm/mrcp  08/07/2015  CLINICAL DATA:  70 year old female with history of left upper quadrant abdominal pain since Saturday 08/04/2015. Some vomiting. EXAM: MRI ABDOMEN WITHOUT AND WITH CONTRAST (INCLUDING MRCP) TECHNIQUE: Multiplanar multisequence MR imaging of the abdomen was performed both before and after the administration of intravenous contrast. Heavily T2-weighted images of  the biliary and pancreatic ducts were obtained, and three-dimensional MRCP images were rendered by post processing. CONTRAST:  20mL MULTIHANCE GADOBENATE DIMEGLUMINE 529 MG/ML IV SOLN COMPARISON:  No prior abdominal MRI. CT the abdomen and pelvis 08/04/2015. FINDINGS: Lower chest: There is a suggestion of a mass lesion in the right mid to upper lung (image 1 of series 15) which is incompletely visualized but appears to measure approximately 2.5 x 3.5 cm, and is also visualized on postcontrast imaging (example on image 1 of series 1604). However, this lesion is favored to be artifactual related to phase wrap artifact, as this area is covered on coronal post gadolinium imaging and no  nodule or mass is identified. Linear areas of signal intensity are noted in the lung bases bilaterally, favored to reflect areas of subsegmental atelectasis and/or scarring. Trace right pleural effusion lying dependently. Hepatobiliary: Diffuse heterogeneous loss of signal intensity throughout the hepatic parenchyma on out of phase dual echo images, compatible with hepatic steatosis. No suspicious cystic or solid hepatic lesions. All filling defects are noted within the gallbladder, compatible with gallstones. Gallbladder is moderately distended. Trace amount of pericholecystic fluid. Gallbladder wall thickness is slightly increased at 4 mm. MRCP images demonstrate no intra or extrahepatic biliary ductal dilatation. Additionally, there is no filling defect in the common bile duct to suggest choledocholithiasis. Common bile duct is normal in caliber measuring 4 mm in the porta hepatis. Pancreas: No pancreatic mass. No pancreatic ductal dilatation on MRCP images. No pancreatic or peripancreatic fluid or inflammatory changes. Spleen: Unremarkable. Adrenals/Urinary Tract: Bilateral adrenal glands are normal in appearance. In the kidneys bilaterally there are lesions that are low T1 signal intensity, high T2 signal intensity, and do not  enhance, compatible with simple cysts, the largest of which is exophytic measuring 9.2 cm in the upper pole of the left kidney. No suspicious renal lesions. No hydroureteronephrosis in the visualized abdomen. Stomach/Bowel: Visualized portions are unremarkable. Vascular/Lymphatic: No aneurysm identified in the visualized abdominal vasculature. No lymphadenopathy noted in the abdomen. Other: No significant volume of ascites in the visualized peritoneal cavity. Musculoskeletal: No aggressive osseous lesions are identified in the visualized portions of the skeleton. IMPRESSION: 1. Study is positive for cholelithiasis. Additionally, there is gallbladder distention, mild gallbladder wall thickening and a trace amount of pericholecystic fluid. Clinical correlation for signs and symptoms of acute cholecystitis is recommended. 2. No evidence of choledocholithiasis and no findings to suggest biliary tract obstruction at this time. 3. Some sequences demonstrate an apparent mass-like area of increased signal in the mid to upper right lung, as discussed above, however, other pulse sequences demonstrate no such lesion, suggesting that this could be phase wrap artifact at the upper aspect of the acquired images (related to wrap from lower anatomic structures in the low abdomen or pelvis). However, further evaluation with contrast enhanced chest CT is suggested in the near future to exclude underlying pulmonary malignancy. 4. Heterogeneous hepatic steatosis. 5. Simple cysts in the kidneys bilaterally measuring up to 9.2 cm in the upper pole of the left kidney. These results will be called to the ordering clinician or representative by the Radiologist Assistant, and communication documented in the PACS or zVision Dashboard. Electronically Signed   By: Trudie Reedaniel  Entrikin M.D.   On: 08/07/2015 16:11   Medications: I have reviewed the patient's current medications. Scheduled Meds: . amLODipine  5 mg Oral Daily  . buPROPion  100 mg  Oral TID  . furosemide  60 mg Intravenous Daily  . mirabegron ER  50 mg Oral Daily  . multivitamin with minerals  1 tablet Oral Daily  . piperacillin-tazobactam (ZOSYN)  IV  3.375 g Intravenous Q8H  . potassium chloride  40 mEq Oral BID   Continuous Infusions:   PRN Meds:.acetaminophen, ipratropium-albuterol, morphine injection, technetium TC 15M diethylenetriame-pentaacetic acid Assessment/Plan: Principal Problem:   Cholecystitis, acute Active Problems:   Hyperlipidemia   Severe obesity (BMI >= 40) (HCC)   Major depression, chronic (HCC)   OSA (obstructive sleep apnea)   HTN (hypertension)   GERD   OA (osteoarthritis) of knee   PULMONARY EMBOLISM, HX OF   Pulmonary hypertension (HCC)   Urge incontinence   Cholelithiasis with  cholecystitis   Elevated LFTs   Chronic saddle pulmonary embolism with acute cor pulmonale (HCC)   Essential hypertension   Obesity hypoventilation syndrome (HCC)   Dyspnea   Acute respiratory failure with hypoxia (HCC)   Other fluid overload   Chronic anticoagulation  Ms. Grossi is a 70 yo F with a PMHx of diverticulosis, Hep B, PE, HTN, OSA, HLD, CKD presenting to the hospital complaining of right sided abdominal pain.  Cholecystitis and Cholangitis, improving: MRCP shows gallstones without obstructing stones.  GI recommending IOC.  Due to her past large PE, it should be restarted 12 hours after surgery if no complications during the procedure and low risk of bleeding. - Surgery following, appreciate recs.  - GI consult, appreciate rec's - Zosyn [ ]  BCx NGTD  Pulmonary Edema, resolved: Dyspnea likely iatrogenic volume overload due to sepsis protocol fluids.  O2 sat now >92% on RA.  MRCP noted a "suggestion of mass lesion" thought to be artifact, but CT chest with contrast was recommended to exclude malignancy.  Given her relatively aggressive diuresis and probable artifactual nature of the lesion, will defer CT scan until outpatient follow up. -  I/Os - Incentive Spirometry - CT chest with contrast post-discharge  Asymptomatic bacteriuria: >100,000 colonies E coli, sensitive to Zosyn - Zosyn as above  History of PE and Antiphospholipid: On Xarelto at home. - Hold Xarelto in setting of probably surgery - Heparin gtt   HTN: Amlodipine 5 mg daily at home. BP stable.  - HOLD PO meds  HLD: Pravastatin 40 mg daily at home - HOLD PO meds  Depression: Wellbutrin 100 mg TID at home.  - HOLD PO meds  Overactive bladder: Mirabegron at home - HOLD PO meds   DVT ppx: Heparin  Diet: clear liquids  Code: Full (confimed with patient)  Dispo: Disposition is deferred at this time, awaiting improvement of current medical problems.  Anticipated discharge in approximately 3-5 day(s).   The patient does have a current PCP [name], MD) and does need an [facility] follow-up appointment after discharge.  The patient does not have transportation limitations that hinder transportation to clinic appointments.  .Services Needed at time of discharge: Y = Yes, Blank = No PT:   OT:   RN:   Equipment:   Other:     LOS: 5 days   Jana Half, MD 08/09/2015, 7:17 AM

## 2015-08-09 NOTE — Progress Notes (Signed)
Patient ID: [name], female   DOB: 10-19-1945, 70 y.o.   MRN: [id number] Medicine attending: I examined this patient together with resident physician Dr. Venia MinksNicholas Taylor and I concur with his evaluation and management plan which we discussed together. Minimal basilar rales. Oxygenation continues to improve. Lab parameters stable. She is medically stable to proceed with cholecystectomy today.

## 2015-08-09 NOTE — Care Management Note (Addendum)
Case Management Note  Patient Details  Name: [name] MRN: [id number] Date of Birth: 04-10-1945  Subjective/Objective:    Calculus of gallbladder with acute cholecystitis, Laparoscopic Cholecystectomy 08/09/2015           Action/Plan: Discharge Planning:  [facility] spoke to pt and [facility] She lives at home alone in subsidized apartment. States [facility] did [facility] [employer] [employer] [facility]. She has wide RW at home. She receives disability but her income is limited.   Dennard NipPCP-Elizabeth Butcher MD  08/10/2015 [facility] Waiting PT recommendations for home. Pt has RW and 3n1 at home. Will wait to see if oxygen is needed at dc.    Expected Discharge Date:  08/11/2015             Expected Discharge Plan:  Home w Home Health Services  In-House Referral:  NA  Discharge planning Services  CM Consult  Post Acute Care Choice:  Home Health, Resumption of Svcs/PTA Provider Choice offered to:  Patient  DME Arranged:  N/A DME Agency:  NA  HH Arranged:    HH Agency:     Status of Service:  In process, will continue to follow  Medicare Important Message Given:  Yes Date Medicare IM Given:    Medicare IM give by:    Date Additional Medicare IM Given:    Additional Medicare Important Message give by:     If discussed at Long Length of Stay Meetings, dates discussed:    Additional Comments:  Elliot CousinShavis, Gretchen Weinfeld Ellen, RN 08/09/2015, 4:04 PM

## 2015-08-09 NOTE — Transfer of Care (Signed)
Immediate Anesthesia Transfer of Care Note  Patient: Danni T Sharol HarnessSimmons  Procedure(s) Performed: Procedure(s): LAPAROSCOPIC CHOLECYSTECTOMY WITH INTRAOPERATIVE CHOLANGIOGRAM (N/A)  Patient Location: PACU  Anesthesia Type:General  Level of Consciousness: awake, alert , oriented and patient cooperative  Airway & Oxygen Therapy: Patient Spontanous Breathing and Patient connected to face mask oxygen  Post-op Assessment: Report given to RN, Post -op Vital signs reviewed and stable, Patient moving all extremities X 4 and Patient able to stick tongue midline  Post vital signs: Reviewed and stable  Last Vitals:  Filed Vitals:   08/09/15 0355 08/09/15 0700  BP: 132/77 127/73  Pulse: 81 86  Temp: 37.2 C 36.9 C  Resp: 20 20    Last Pain:  Filed Vitals:   08/09/15 0901  PainSc: 0-No pain         Complications: Front tooth that was extremely loose and moved in all directions. Missing at time of admission into PACU.  Dr Randa EvensEdwards updated and order for chest x-ray noted. vss

## 2015-08-09 NOTE — Progress Notes (Signed)
ANTICOAGULATION CONSULT NOTE - Follow Up Consult  Pharmacy Consult for heparin Indication: h/o PE  Allergies  Allergen Reactions  . Lisinopril     REACTION: PT with cough    Patient Measurements: Height: 5\' 7"  (170.2 cm) Weight: (!) 381 lb 9.9 oz (173.1 kg) IBW/kg (Calculated) : 61.6 Heparin Dosing Weight: 105kg  Vital Signs: Temp: 98.4 F (36.9 C) (06/08 0700) Temp Source: Oral (06/08 0700) BP: 127/73 mmHg (06/08 0700) Pulse Rate: 86 (06/08 0700)  Labs:  Recent Labs  08/06/15 1031  08/07/15 0411 08/08/15 0407 08/09/15 0347  HGB  --   < > 13.1 13.4 13.0  HCT  --   --  40.5 41.2 40.0  PLT  --   --  163 184 210  APTT 69*  --  68*  --   --   HEPARINUNFRC  --   --  0.36 0.39 0.46  CREATININE  --   --  1.01* 1.06* 0.99  < > = values in this interval not displayed.  Estimated Creatinine Clearance: 91.2 mL/min (by C-G formula based on Cr of 0.99).   Scheduled:  . amLODipine  5 mg Oral Daily  . buPROPion  100 mg Oral TID  . furosemide  60 mg Intravenous Daily  . mirabegron ER  50 mg Oral Daily  . multivitamin with minerals  1 tablet Oral Daily  . piperacillin-tazobactam (ZOSYN)  IV  3.375 g Intravenous Q8H  . potassium chloride  40 mEq Oral BID   Assessment: 70yo female c/o abdominal pain and emesis, admitted w/ probable cholecystitis, to be evaluated by surgeon, to begin heparin bridge while Xarelto on hold for h/o PE; last dose of Xarelto taken 6/2 at 1230.  Heparin level was therapeutic at 0.46. Plan is for surgery today. 5 day xarelto washout complete today.   Goal of Therapy:  Heparin level 0.3-0.7 units/ml aPTT 66-102 seconds Monitor platelets by anticoagulation protocol: Yes   Plan:   Heparin stopped 6/8 at 0400 in preparation for surgery, if surgery goes well and wound dressings dry, plan to resume heparin 12 hours post op. Plan to restart heparin at 1900 units/hr Follow up restart heparin/DOAC Daily HL, CBC   Thank you for allowing us to  participate in this patients care. Signe Coltonya C Matayah Reyburn, PharmD Pager: [phone] 08/09/2015 9:49 AM

## 2015-08-09 NOTE — Anesthesia Procedure Notes (Signed)
Procedure Name: Intubation Date/Time: 08/09/2015 12:01 PM Performed by: Salomon MastWALL, Taryn Nave COREY Pre-anesthesia Checklist: Patient identified, Emergency Drugs available, Suction available and Patient being monitored Patient Re-evaluated:Patient Re-evaluated prior to inductionOxygen Delivery Method: Circle system utilized Preoxygenation: Pre-oxygenation with 100% oxygen Intubation Type: IV induction Grade View: Grade I Number of attempts: 1 Airway Equipment and Method: Video-laryngoscopy Placement Confirmation: ETT inserted through vocal cords under direct vision,  positive ETCO2 and breath sounds checked- equal and bilateral Secured at: 24 cm Tube secured with: Tape Difficulty Due To: Difficulty was anticipated

## 2015-08-09 NOTE — Op Note (Signed)
Laparoscopic Cholecystectomy with IOC Procedure Note  Indications: This patient presents with symptomatic gallbladder disease and will undergo laparoscopic cholecystectomy.The procedure has been discussed with the patient. Operative and non operative treatments have been discussed. Risks of surgery include bleeding, infection,  Common bile duct injury,  Injury to the stomach,liver, colon,small intestine, abdominal wall,  Diaphragm,  Major blood vessels,  And the need for an open procedure.  Other risks include worsening of medical problems, death,  DVT and pulmonary embolism, and cardiovascular events.   Medical options have also been discussed. The patient has been informed of long term expectations of surgery and non surgical options,  The patient agrees to proceed.    Pre-operative Diagnosis: Calculus of gallbladder with acute cholecystitis, without mention of obstruction  Post-operative Diagnosis: Same  Surgeon: Sydnei Ohaver A.   Assistants: Orson SlickBowman RNFA   Anesthesia: General endotracheal anesthesia and Local anesthesia 0.25.% bupivacaine, with epinephrine  ASA Class: 3  Procedure Details  The patient was seen again in the Holding Room. The risks, benefits, complications, treatment options, and expected outcomes were discussed with the patient. The possibilities of reaction to medication, pulmonary aspiration, perforation of viscus, bleeding, recurrent infection, finding a normal gallbladder, the need for additional procedures, failure to diagnose a condition, the possible need to convert to an open procedure, and creating a complication requiring transfusion or operation were discussed with the patient. The patient and/or family concurred with the proposed plan, giving informed consent. The site of surgery properly noted/marked. The patient was taken to Operating Room, identified as [name] and the procedure verified as Laparoscopic Cholecystectomy with Intraoperative  Cholangiograms. A Time Out was held and the above information confirmed.  Prior to the induction of general anesthesia, antibiotic prophylaxis was administered. General endotracheal anesthesia was then administered and tolerated well. After the induction, the abdomen was prepped in the usual sterile fashion. The patient was positioned in the supine position with the left arm comfortably tucked, along with some reverse Trendelenburg.  Local anesthetic agent was injected into the skin near the umbilicus and an incision made. The midline fascia was incised and the Hasson technique was used to introduce a 12 mm port under direct vision. It was secured with a figure of eight Vicryl suture placed in the usual fashion. Pneumoperitoneum was then created with CO2 and tolerated well without any adverse changes in the patient's vital signs. Additional trocars were introduced under direct vision with an 11 mm trocar in the epigastrium and TWO  5 mm trocars in the right upper quadrant. All skin incisions were infiltrated with a local anesthetic agent before making the incision and placing the trocars.   The gallbladder was identified, the fundus grasped and retracted cephalad. Adhesions were lysed bluntly and with the electrocautery where indicated, taking care not to injure any adjacent organs or viscus. The infundibulum was grasped and retracted laterally, exposing the peritoneum overlying the triangle of Calot. This was then divided and exposed in a blunt fashion. The cystic duct was clearly identified and bluntly dissected circumferentially. The junctions of the gallbladder, cystic duct and common bile duct were clearly identified prior to the division of any linear structure.   An incision was made in the cystic duct and the cholangiogram catheter introduced. The catheter was secured using an endoclip. The study showed no stones and good visualization of the distal and proximal biliary tree. The catheter was then  removed.   The cystic duct was then  ligated with surgical clips and Endoloop  on the patient side and  clipped on the gallbladder side and divided. The cystic artery was identified, dissected free, ligated with clips and divided as well. Posterior cystic artery clipped and divided.  The gallbladder was dissected from the liver bed in retrograde fashion with the electrocautery. The gallbladder was removed. The liver bed was irrigated and inspected. Hemostasis was achieved with the electrocautery. Copious irrigation was utilized and was repeatedly aspirated until clear all particulate matter. Hemostasis was achieved with no signs  Of bleeding or bile leakage.  Pneumoperitoneum was completely reduced after viewing removal of the trocars under direct vision. The wound was thoroughly irrigated and the fascia was then closed with a figure of eight suture; the skin was then closed with 4 O monocryl   and a sterile dressing of Liquid adhesive  was applied.  Instrument, sponge, and needle counts were correct at closure and at the conclusion of the case.   Findings: Cholecystitis with Cholelithiasis  Estimated Blood Loss: less than 50 mL         Drains: none          Total IV Fluids: 800 mL         Specimens: Gallbladder           Complications: None; patient tolerated the procedure well.         Disposition: PACU - hemodynamically stable.         Condition: stable

## 2015-08-09 NOTE — Care Management Important Message (Signed)
Important Message  Patient Details  Name: [name] MRN: [id number] Date of Birth: 1945/05/20   Medicare Important Message Given:  Yes    [name], [name] [name] 08/09/2015, 10:39 AM

## 2015-08-09 NOTE — Interval H&P Note (Signed)
History and Physical Interval Note:  08/09/2015 11:19 AM  [name]  has presented today for surgery, with the diagnosis of ACUTE CHOLECYSTITIS/CHOLELITHIASIS  The various methods of treatment have been discussed with the patient and family. After consideration of risks, benefits and other options for treatment, the patient has consented to  Procedure(s): LAPAROSCOPIC CHOLECYSTECTOMY WITH INTRAOPERATIVE CHOLANGIOGRAM (N/A) as a surgical intervention .  The patient's history has been reviewed, patient examined, no change in status, stable for surgery.  I have reviewed the patient's chart and labs.  Questions were answered to the patient's satisfaction.     Misa Fedorko A.

## 2015-08-10 ENCOUNTER — Encounter (HOSPITAL_COMMUNITY): Payer: Self-pay | Admitting: Surgery

## 2015-08-10 DIAGNOSIS — K810 Acute cholecystitis: Secondary | ICD-10-CM | POA: Diagnosis present

## 2015-08-10 DIAGNOSIS — J9601 Acute respiratory failure with hypoxia: Secondary | ICD-10-CM

## 2015-08-10 DIAGNOSIS — Z9049 Acquired absence of other specified parts of digestive tract: Secondary | ICD-10-CM

## 2015-08-10 LAB — CBC WITH DIFFERENTIAL/PLATELET
BASOS ABS: 0.1 10*3/uL (ref 0.0–0.1)
Basophils Relative: 1 %
EOS PCT: 2 %
Eosinophils Absolute: 0.2 10*3/uL (ref 0.0–0.7)
HEMATOCRIT: 39.5 % (ref 36.0–46.0)
HEMOGLOBIN: 12.8 g/dL (ref 12.0–15.0)
LYMPHS ABS: 1.7 10*3/uL (ref 0.7–4.0)
Lymphocytes Relative: 20 %
MCH: 26.1 pg (ref 26.0–34.0)
MCHC: 32.4 g/dL (ref 30.0–36.0)
MCV: 80.6 fL (ref 78.0–100.0)
Monocytes Absolute: 0.6 10*3/uL (ref 0.1–1.0)
Monocytes Relative: 7 %
Neutro Abs: 6.1 10*3/uL (ref 1.7–7.7)
Neutrophils Relative %: 70 %
Platelets: 195 10*3/uL (ref 150–400)
RBC: 4.9 MIL/uL (ref 3.87–5.11)
RDW: 15.4 % (ref 11.5–15.5)
WBC: 8.7 10*3/uL (ref 4.0–10.5)

## 2015-08-10 LAB — COMPREHENSIVE METABOLIC PANEL
ALBUMIN: 2.9 g/dL — AB (ref 3.5–5.0)
ALK PHOS: 143 U/L — AB (ref 38–126)
ALT: 131 U/L — ABNORMAL HIGH (ref 14–54)
AST: 116 U/L — ABNORMAL HIGH (ref 15–41)
Anion gap: 7 (ref 5–15)
BILIRUBIN TOTAL: 2 mg/dL — AB (ref 0.3–1.2)
BUN: 11 mg/dL (ref 6–20)
CHLORIDE: 102 mmol/L (ref 101–111)
CO2: 26 mmol/L (ref 22–32)
CREATININE: 1.1 mg/dL — AB (ref 0.44–1.00)
Calcium: 8.8 mg/dL — ABNORMAL LOW (ref 8.9–10.3)
GFR calc Af Amer: 58 mL/min — ABNORMAL LOW (ref >60)
GFR, EST NON AFRICAN AMERICAN: 50 mL/min — AB (ref >60)
GLUCOSE: 97 mg/dL (ref 65–99)
Potassium: 4.8 mmol/L (ref 3.5–5.1)
SODIUM: 135 mmol/L (ref 135–145)
Total Protein: 6.6 g/dL (ref 6.5–8.1)

## 2015-08-10 LAB — CULTURE, BLOOD (ROUTINE X 2)
CULTURE: NO GROWTH
Culture: NO GROWTH

## 2015-08-10 LAB — HEPARIN LEVEL (UNFRACTIONATED): HEPARIN UNFRACTIONATED: 0.43 [IU]/mL (ref 0.30–0.70)

## 2015-08-10 NOTE — Progress Notes (Addendum)
Patient ID: [name], female   DOB: Dec 31, 1945, 70 y.o.   MRN: [id number] Medicine attending: I examined this patient today together with resident physician Dr. Venia MinksNicholas Taylor and I concur with his evaluation and management plan. She underwent uncomplicated cholecystectomy yesterday. She reports that she lost one of her teeth in the process which she coughed up after the procedure. Respiratory status is stable but she is still requiring oxygen currently on 3 L nasal cannula with oxygen saturation 95%. She remains afebrile they 6 Zosyn. White blood count has normalized. Liver chemistries stable with falling bilirubin. Impression: #1. Acute cholecystitis now by mouth day 1 laparoscopic cholecystectomy. #2. Acute hypoxic respiratory failure in a morbidly obese lady with likely obesity hypoventilation syndrome following initial aggressive fluid resuscitation given in the emergency department as part of sepsis protocol. We anticipate being able to stop IV antibiotics tomorrow. Continue to taper off oxygen. As needed diuretics. #3. Chronic anticoagulation 2 years status post unprovoked massive bilateral pulmonary emboli. Full dose unfractionated heparin resume 12 hours postop. We should be able to transition her back to oral anticoagulation tomorrow. Postop hemoglobin stable at 12.8 compared with preop value of 13.

## 2015-08-10 NOTE — Progress Notes (Signed)
Patient ID: [name], female   DOB: 04-10-1945, 70 y.o.   MRN: [id number]     [facility]      [address] [place], [place] [address]    Phone: [phone] FAX: [phone]     Subjective: Passing flatus. No n/v.  Coughed up tooth.  VSS.  Afebrile. LFTs trending down, IOC negative. No OOB yet.    Objective:  Vital signs:  Filed Vitals:   08/10/15 0310 08/10/15 0314 08/10/15 0500 08/10/15 0800  BP: 127/73     Pulse: 86     Temp:  98.6 F (37 C)  98 F (36.7 C)  TempSrc:  Oral  Oral  Resp: 17     Height:      Weight:   172.5 kg (380 lb 4.7 oz)   SpO2: 95%       Last BM Date: 08/08/15  Intake/Output   Yesterday:  06/08 0701 - 06/09 0700 In: 1918.2 [P.O.:240; I.V.:1578.2; IV Piggyback:100] Out: 5993 [Urine:1722; Blood:50] This shift:  Total I/O In: -  Out: 175 [Urine:175]   Physical Exam: General: Pt awake/alert/oriented x4 in no acute distress  Abdomen: Soft.  Nondistended.  Mildly tender at incisions only.  No evidence of peritonitis.  No incarcerated hernias.   Problem List:   Principal Problem:   Cholecystitis, acute Active Problems:   Hyperlipidemia   Severe obesity (BMI >= 40) (HCC)   Major depression, chronic (HCC)   OSA (obstructive sleep apnea)   HTN (hypertension)   GERD   OA (osteoarthritis) of knee   PULMONARY EMBOLISM, HX OF   Pulmonary hypertension (HCC)   Urge incontinence   Cholelithiasis with cholecystitis   Elevated LFTs   Chronic saddle pulmonary embolism with acute cor pulmonale (HCC)   Essential hypertension   Obesity hypoventilation syndrome (HCC)   Dyspnea   Acute respiratory failure with hypoxia (HCC)   Other fluid overload   Chronic anticoagulation    Results:   Labs: Results for orders placed or performed during the hospital encounter of 08/04/15 (from the past 48 hour(s))  Heparin level (unfractionated)     Status: None   Collection Time: 08/09/15  3:47  AM  Result Value Ref Range   Heparin Unfractionated 0.46 0.30 - 0.70 IU/mL    Comment:        IF HEPARIN RESULTS ARE BELOW EXPECTED VALUES, AND PATIENT DOSAGE HAS BEEN CONFIRMED, SUGGEST FOLLOW UP TESTING OF ANTITHROMBIN III LEVELS.   Comprehensive metabolic panel     Status: Abnormal   Collection Time: 08/09/15  3:47 AM  Result Value Ref Range   Sodium 134 (L) 135 - 145 mmol/L   Potassium 4.5 3.5 - 5.1 mmol/L    Comment: DELTA CHECK NOTED   Chloride 99 (L) 101 - 111 mmol/L   CO2 25 22 - 32 mmol/L   Glucose, Bld 93 65 - 99 mg/dL   BUN 9 6 - 20 mg/dL   Creatinine, Ser 0.99 0.44 - 1.00 mg/dL   Calcium 9.0 8.9 - 10.3 mg/dL   Total Protein 7.1 6.5 - 8.1 g/dL   Albumin 2.9 (L) 3.5 - 5.0 g/dL   AST 134 (H) 15 - 41 U/L   ALT 123 (H) 14 - 54 U/L   Alkaline Phosphatase 159 (H) 38 - 126 U/L   Total Bilirubin 3.1 (H) 0.3 - 1.2 mg/dL   GFR calc non Af Amer 57 (L) >60 mL/min   GFR calc Af Amer >60 >60  mL/min    Comment: (NOTE) The eGFR has been calculated using the CKD EPI equation. This calculation has not been validated in all clinical situations. eGFR's persistently <60 mL/min signify possible Chronic Kidney Disease.    Anion gap 10 5 - 15  CBC with Differential/Platelet     Status: Abnormal   Collection Time: 08/09/15  3:47 AM  Result Value Ref Range   WBC 6.7 4.0 - 10.5 K/uL   RBC 5.04 3.87 - 5.11 MIL/uL   Hemoglobin 13.0 12.0 - 15.0 g/dL   HCT 40.0 36.0 - 46.0 %   MCV 79.4 78.0 - 100.0 fL   MCH 25.8 (L) 26.0 - 34.0 pg   MCHC 32.5 30.0 - 36.0 g/dL   RDW 14.9 11.5 - 15.5 %   Platelets 210 150 - 400 K/uL   Neutrophils Relative % 67 %   Neutro Abs 4.5 1.7 - 7.7 K/uL   Lymphocytes Relative 24 %   Lymphs Abs 1.6 0.7 - 4.0 K/uL   Monocytes Relative 7 %   Monocytes Absolute 0.5 0.1 - 1.0 K/uL   Eosinophils Relative 2 %   Eosinophils Absolute 0.2 0.0 - 0.7 K/uL   Basophils Relative 0 %   Basophils Absolute 0.0 0.0 - 0.1 K/uL  Glucose, capillary     Status: Abnormal    Collection Time: 08/09/15  7:23 AM  Result Value Ref Range   Glucose-Capillary 118 (H) 65 - 99 mg/dL   Comment 1 Notify RN    Comment 2 Document in Chart   Comprehensive metabolic panel     Status: Abnormal   Collection Time: 08/10/15  4:03 AM  Result Value Ref Range   Sodium 135 135 - 145 mmol/L   Potassium 4.8 3.5 - 5.1 mmol/L   Chloride 102 101 - 111 mmol/L   CO2 26 22 - 32 mmol/L   Glucose, Bld 97 65 - 99 mg/dL   BUN 11 6 - 20 mg/dL   Creatinine, Ser 1.10 (H) 0.44 - 1.00 mg/dL   Calcium 8.8 (L) 8.9 - 10.3 mg/dL   Total Protein 6.6 6.5 - 8.1 g/dL   Albumin 2.9 (L) 3.5 - 5.0 g/dL   AST 116 (H) 15 - 41 U/L   ALT 131 (H) 14 - 54 U/L   Alkaline Phosphatase 143 (H) 38 - 126 U/L   Total Bilirubin 2.0 (H) 0.3 - 1.2 mg/dL   GFR calc non Af Amer 50 (L) >60 mL/min   GFR calc Af Amer 58 (L) >60 mL/min    Comment: (NOTE) The eGFR has been calculated using the CKD EPI equation. This calculation has not been validated in all clinical situations. eGFR's persistently <60 mL/min signify possible Chronic Kidney Disease.    Anion gap 7 5 - 15  CBC with Differential/Platelet     Status: None   Collection Time: 08/10/15  4:03 AM  Result Value Ref Range   WBC 8.7 4.0 - 10.5 K/uL   RBC 4.90 3.87 - 5.11 MIL/uL   Hemoglobin 12.8 12.0 - 15.0 g/dL   HCT 39.5 36.0 - 46.0 %   MCV 80.6 78.0 - 100.0 fL   MCH 26.1 26.0 - 34.0 pg   MCHC 32.4 30.0 - 36.0 g/dL   RDW 15.4 11.5 - 15.5 %   Platelets 195 150 - 400 K/uL   Neutrophils Relative % 70 %   Lymphocytes Relative 20 %   Monocytes Relative 7 %   Eosinophils Relative 2 %   Basophils Relative 1 %  Neutro Abs 6.1 1.7 - 7.7 K/uL   Lymphs Abs 1.7 0.7 - 4.0 K/uL   Monocytes Absolute 0.6 0.1 - 1.0 K/uL   Eosinophils Absolute 0.2 0.0 - 0.7 K/uL   Basophils Absolute 0.1 0.0 - 0.1 K/uL   RBC Morphology POLYCHROMASIA PRESENT    WBC Morphology ATYPICAL LYMPHOCYTES     Imaging / Studies: Dg Cholangiogram Operative  08/09/2015  CLINICAL DATA:   Cholelithiasis, acute cholecystitis EXAM: INTRAOPERATIVE CHOLANGIOGRAM TECHNIQUE: Cholangiographic images from the C-arm fluoroscopic device were submitted for interpretation post-operatively. Please see the procedural report for the amount of contrast and the fluoroscopy time utilized. COMPARISON:  MR 08/07/2015 FINDINGS: No persistent filling defects in the common duct. Intrahepatic ducts are incompletely visualized, appearing decompressed centrally. Contrast passes into the duodenum. : Negative for retained common duct stone. Electronically Signed   By: Lucrezia Europe M.D.   On: 08/09/2015 13:49   Dg Chest Port 1 View  08/09/2015  CLINICAL DATA:  Postop foreign body.  Rule out aspiration of tooth. EXAM: PORTABLE CHEST 1 VIEW COMPARISON:  August 06, 2015 FINDINGS: The cardiomediastinal silhouette is stable. Persistent edema is mildly improved. More focal rounded opacity is seen in the right costophrenic angle, incompletely evaluated. No other interval changes or acute abnormalities. IMPRESSION: 1. Cardiomegaly and mild edema. 2. Rounded opacity project over the right costophrenic angle. The study was performed to evaluate for an aspirated tooth. A PA and lateral chest x-ray could better evaluate the right costophrenic angle. Electronically Signed   By: Dorise Bullion III M.D   On: 08/09/2015 15:24    Medications / Allergies:  Scheduled Meds: . amLODipine  5 mg Oral Daily  . buPROPion  100 mg Oral TID  . furosemide  60 mg Intravenous Daily  . mirabegron ER  50 mg Oral Daily  . multivitamin with minerals  1 tablet Oral Daily  . piperacillin-tazobactam (ZOSYN)  IV  3.375 g Intravenous Q8H   Continuous Infusions: . heparin 1,900 Units/hr (08/10/15 0153)   PRN Meds:.acetaminophen, ALPRAZolam, ipratropium-albuterol, morphine injection, oxyCODONE-acetaminophen, technetium TC 37M diethylenetriame-pentaacetic acid  Antibiotics: Anti-infectives    Start     Dose/Rate Route Frequency Ordered Stop   08/05/15  1800  vancomycin (VANCOCIN) 1,750 mg in sodium chloride 0.9 % 500 mL IVPB  Status:  Discontinued     1,750 mg 250 mL/hr over 120 Minutes Intravenous Every 24 hours 08/04/15 1740 08/04/15 2337   08/05/15 0000  piperacillin-tazobactam (ZOSYN) IVPB 3.375 g     3.375 g 12.5 mL/hr over 240 Minutes Intravenous Every 8 hours 08/04/15 1744     08/04/15 1745  vancomycin (VANCOCIN) 2,500 mg in sodium chloride 0.9 % 500 mL IVPB     2,500 mg 250 mL/hr over 120 Minutes Intravenous STAT 08/04/15 1731 08/04/15 2103   08/04/15 1730  piperacillin-tazobactam (ZOSYN) IVPB 3.375 g     3.375 g 100 mL/hr over 30 Minutes Intravenous  Once 08/04/15 1724 08/04/15 1904   08/04/15 1730  vancomycin (VANCOCIN) IVPB 1000 mg/200 mL premix  Status:  Discontinued     1,000 mg 200 mL/hr over 60 Minutes Intravenous  Once 08/04/15 1724 08/04/15 1731        Assessment/Plan Acute calculous cholecystitis PO#1 laparoscopic cholecystectomy with IOC---Dr. Brantley Stage -advance diet, needs to be mobilized, encourage PO pain meds, IS -can dc foley from a surgical standpoint DVT prophylaxis-heparin gtt, may resume Xarelto tomorrow  ID-dc zosyn   Erby Pian, ANP-BC [facility] Pager [phone](7A-4:30P) For consults and floor pages call [phone](7A-4:30P)  08/10/2015 9:35 AM

## 2015-08-10 NOTE — Progress Notes (Signed)
ANTICOAGULATION CONSULT NOTE - Follow Up Consult  Pharmacy Consult for heparin Indication: h/o PE  Allergies  Allergen Reactions  . Lisinopril     REACTION: PT with cough    Patient Measurements: Height: 5\' 7"  (170.2 cm) Weight: (!) 380 lb 4.7 oz (172.5 kg) IBW/kg (Calculated) : 61.6 Heparin Dosing Weight: 105kg  Vital Signs: Temp: 98 F (36.7 C) (06/09 0800) Temp Source: Oral (06/09 0800) BP: 127/73 mmHg (06/09 0310) Pulse Rate: 86 (06/09 0310)  Labs:  Recent Labs  08/08/15 0407 08/09/15 0347 08/10/15 0403 08/10/15 0944  HGB 13.4 13.0 12.8  --   HCT 41.2 40.0 39.5  --   PLT 184 210 195  --   HEPARINUNFRC 0.39 0.46  --  0.43  CREATININE 1.06* 0.99 1.10*  --     Estimated Creatinine Clearance: 81.9 mL/min (by C-G formula based on Cr of 1.1).   Scheduled:  . amLODipine  5 mg Oral Daily  . buPROPion  100 mg Oral TID  . furosemide  60 mg Intravenous Daily  . mirabegron ER  50 mg Oral Daily  . multivitamin with minerals  1 tablet Oral Daily   Assessment: 70yo female c/o abdominal pain and emesis, admitted w/ probable cholecystitis, to be evaluated by surgeon, to begin heparin bridge while Xarelto on hold for h/o PE; last dose of Xarelto taken 6/2 at 1230.  Heparin level therapeutic at 0.43. CBC stable. S/p surgery 6/8. 5 day xarelto washout completed prior to surgery.   Goal of Therapy:  Heparin level 0.3-0.7 units/ml aPTT 66-102 seconds Monitor platelets by anticoagulation protocol: Yes   Plan:   Heparin restarted overnight 12 hours post op Continue heparin at 1900 units/hr Plan to restart Xarelto on discharge (Start 1 hour after heparin discontinuation) Daily HL, CBC   Thank you for allowing us to participate in this patients care. Signe Coltonya C Eudora Guevarra, PharmD Pager: [phone] 08/10/2015 10:53 AM

## 2015-08-10 NOTE — Progress Notes (Signed)
Pt due to void since foley dc'd this evening. Pt bladder scanned around 2300 and had 216mL of urine. Internal medicine teaching service paged. Dr. Earnest ConroyFlores made aware and stated to monitor pt and wait a little while longer before In/Out cath. Will continue to monitor pt.

## 2015-08-10 NOTE — Evaluation (Signed)
Physical Therapy Evaluation Patient Details Name: [name] MRN: [id number] DOB: Aug 04, 1945 Today's Date: 08/10/2015   History of Present Illness  Pt admit with cholecystitis.  S/p surgery 08/09/15.    Clinical Impression  Patient seen for reassessment post op. Patient demonstrates deficits in functional mobility as indicated below. Will need continued skilled PT to address deficits and maximize function. Will see as indicated and progress as tolerated. Given level of functional mobility and decreased caregiver support, highly recommend ST SNF upon acute discharge as patient lives alone and does not have anyone to assist.     Follow Up Recommendations SNF;Supervision for mobility/OOB    Equipment Recommendations  Rolling walker with 5" wheels    Recommendations for Other Services       Precautions / Restrictions Precautions Precautions: Fall Restrictions Weight Bearing Restrictions: No      Mobility  Bed Mobility Overal bed mobility: Needs Assistance Bed Mobility: Supine to Sit;Sit to Supine     Supine to sit: Min assist Sit to supine: Mod assist   General bed mobility comments: Increased time to perform secondary to pain. Moderate assist to return to supine with VCs for splinting. Increased effort for all aspects of mobility  Transfers Overall transfer level: Needs assistance Equipment used: Rolling walker (2 wheeled) Transfers: Sit to/from Stand Sit to Stand: Min assist         General transfer comment: Min assist for stability, increased effort and time to come to upright, limited by pain  Ambulation/Gait Ambulation/Gait assistance: Min assist Ambulation Distance (Feet): 90 Feet Assistive device: Rolling walker (2 wheeled) Gait Pattern/deviations: Step-through pattern;Decreased stride length;Drifts right/left;Trunk flexed;Wide base of support Gait velocity: decreased Gait velocity interpretation: Below normal speed for age/gender General Gait Details:  patient with decreased activity tolerance, increased pain with mobility. Ambulated on 3 liters )2 with sturations in low 90s, patient diaphoretic. HR stable 100s  Stairs            Wheelchair Mobility    Modified Rankin (Stroke Patients Only)       Balance                                             Pertinent Vitals/Pain Pain Assessment: 0-10 Pain Score: 6  Pain Location: abdominal pain Pain Descriptors / Indicators: Aching;Discomfort;Guarding;Operative site guarding Pain Intervention(s): Limited activity within patient's tolerance;Monitored during session;Premedicated before session    Home Living Family/patient expects to be discharged to:: Private residence Living Arrangements: Alone Available Help at Discharge: Friend(s);Available PRN/intermittently Type of Home: Apartment Home Access: Level entry     Home Layout: One level Home Equipment: Walker - 2 wheels;Bedside commode Additional Comments: States her apt is so small she can't put her 3N1 beside the bed.     Prior Function Level of Independence: Independent               Hand Dominance        Extremity/Trunk Assessment               Lower Extremity Assessment: Generalized weakness      Cervical / Trunk Assessment: Normal  Communication   Communication: No difficulties  Cognition Arousal/Alertness: Awake/alert Behavior During Therapy: WFL for tasks assessed/performed Overall Cognitive Status: Within Functional Limits for tasks assessed  General Comments      Exercises        Assessment/Plan    PT Assessment Patient needs continued PT services  PT Diagnosis Generalized weakness   PT Problem List Decreased activity tolerance;Decreased balance;Decreased mobility;Decreased knowledge of use of DME;Decreased safety awareness;Decreased knowledge of precautions  PT Treatment Interventions DME instruction;Gait training;Functional  mobility training;Therapeutic activities;Therapeutic exercise;Balance training;Patient/family education   PT Goals (Current goals can be found in the Care Plan section) Acute Rehab PT Goals Patient Stated Goal: to go home PT Goal Formulation: With patient Time For Goal Achievement: 08/22/15 Potential to Achieve Goals: Good    Frequency Min 3X/week   Barriers to discharge Decreased caregiver support      Co-evaluation               End of Session Equipment Utilized During Treatment: Gait belt Activity Tolerance: Patient limited by fatigue Patient left: in bed;with call bell/phone within reach;with nursing/sitter in room Nurse Communication: Mobility status         Time: [id number] PT Time Calculation (min) (ACUTE ONLY): 24 min   Charges:   PT Evaluation $PT Re-evaluation: 1 Procedure PT Treatments $Gait Training: 8-22 mins   PT G CodesFabio Asa:        Kambrea Carrasco J 08/10/2015, 5:01 PM Charlotte Crumbevon Tory Mckissack, PT DPT  [phone]

## 2015-08-10 NOTE — Progress Notes (Signed)
Subjective: [name].  Patient reports improved abdominal pain after surgery.  She has flatulated. She has tolerated clear liquid diet.  Objective: Vital signs in last 24 hours: Filed Vitals:   08/09/15 2326 08/10/15 0310 08/10/15 0314 08/10/15 0500  BP:  127/73    Pulse:  86    Temp: 99.3 F (37.4 C)  98.6 F (37 C)   TempSrc: Oral  Oral   Resp:  17    Height:      Weight:    380 lb 4.7 oz (172.5 kg)  SpO2:  95%     Weight change: -1 lb 5.2 oz (-0.6 kg)  Intake/Output Summary (Last 24 hours) at 08/10/15 0659 Last data filed at 08/10/15 0600  Gross per 24 hour  Intake 1918.22 ml  Output   1772 ml  Net 146.22 ml   Physical Exam  Constitutional: She is oriented to person, place, and time.  Morbidly obese female, lying in bed, NAD.  HENT:  Head: Normocephalic and atraumatic.  Eyes: EOM are normal. No scleral icterus.  Cardiovascular: Normal rate, regular rhythm and normal heart sounds.   Heart sounds distant, but no murmurs or gallops appreciated.  Pulmonary/Chest: Effort normal. No stridor. No respiratory distress. She has no rales.  Mild crackles in bilateral bases.  Abdominal: Soft. She exhibits no distension. There is no tenderness. There is no rebound and no guarding.  Abdomen nontender. Three 1 cm incisions C/D/I without signs of infection.  Musculoskeletal:  Trace pitting edema to midshin.  Neurological: She is alert and oriented to person, place, and time.  Skin: Skin is warm and dry.    Lab Results: Basic Metabolic Panel:  Recent Labs Lab 08/08/15 0407 08/09/15 0347 08/10/15 0403  NA 137 134* 135  K 3.1* 4.5 4.8  CL 96* 99* 102  CO2 28 25 26   GLUCOSE 93 93 97  BUN 7 9 11   CREATININE 1.06* 0.99 1.10*  CALCIUM 8.7* 9.0 8.8*  MG 2.1  --   --    Liver Function Tests:  Recent Labs Lab 08/09/15 0347 08/10/15 0403  AST 134* 116*  ALT 123* 131*  ALKPHOS 159* 143*  BILITOT 3.1* 2.0*  PROT 7.1 6.6  ALBUMIN 2.9* 2.9*    Recent Labs Lab  08/04/15 1441 08/06/15 1031  LIPASE 24 14   No results for input(s): AMMONIA in the last 168 hours. CBC:  Recent Labs Lab 08/09/15 0347 08/10/15 0403  WBC 6.7 8.7  NEUTROABS 4.5 6.1  HGB 13.0 12.8  HCT 40.0 39.5  MCV 79.4 80.6  PLT 210 195   Cardiac Enzymes:  Recent Labs Lab 08/04/15 1452  TROPONINI <0.03   BNP: No results for input(s): PROBNP in the last 168 hours. D-Dimer: No results for input(s): DDIMER in the last 168 hours. CBG:  Recent Labs Lab 08/09/15 0723  GLUCAP 118*   Hemoglobin A1C: No results for input(s): HGBA1C in the last 168 hours. Fasting Lipid Panel: No results for input(s): CHOL, HDL, LDLCALC, TRIG, CHOLHDL, LDLDIRECT in the last 168 hours. Thyroid Function Tests: No results for input(s): TSH, T4TOTAL, FREET4, T3FREE, THYROIDAB in the last 168 hours. Coagulation: No results for input(s): LABPROT, INR in the last 168 hours. Anemia Panel: No results for input(s): VITAMINB12, FOLATE, FERRITIN, TIBC, IRON, RETICCTPCT in the last 168 hours. Urine Drug Screen: Drugs of Abuse  No results found for: LABOPIA, COCAINSCRNUR, LABBENZ, AMPHETMU, THCU, LABBARB  Alcohol Level: No results for input(s): ETH in the last 168 hours. Urinalysis:  Recent  Labs Lab 08/04/15 1700  COLORURINE AMBER*  LABSPEC 1.042*  PHURINE 6.0  GLUCOSEU NEGATIVE  HGBUR NEGATIVE  BILIRUBINUR SMALL*  KETONESUR NEGATIVE  PROTEINUR 30*  NITRITE POSITIVE*  LEUKOCYTESUR NEGATIVE   Misc. Labs:   Micro Results: Recent Results (from the past 240 hour(s))  Urine culture     Status: Abnormal   Collection Time: 08/04/15  5:00 PM  Result Value Ref Range Status   Specimen Description URINE, CATHETERIZED  Final   Special Requests NONE  Final   Culture >=100,000 COLONIES/mL ESCHERICHIA COLI (A)  Final   Report Status 08/06/2015 FINAL  Final   Organism ID, Bacteria ESCHERICHIA COLI (A)  Final      Susceptibility   Escherichia coli - MIC*    AMPICILLIN >=32 RESISTANT  Resistant     CEFAZOLIN <=4 SENSITIVE Sensitive     CEFTRIAXONE <=1 SENSITIVE Sensitive     CIPROFLOXACIN <=0.25 SENSITIVE Sensitive     GENTAMICIN <=1 SENSITIVE Sensitive     IMIPENEM <=0.25 SENSITIVE Sensitive     NITROFURANTOIN <=16 SENSITIVE Sensitive     TRIMETH/SULFA <=20 SENSITIVE Sensitive     AMPICILLIN/SULBACTAM 16 INTERMEDIATE Intermediate     PIP/TAZO <=4 SENSITIVE Sensitive     * >=100,000 COLONIES/mL ESCHERICHIA COLI  Blood Culture (routine x 2)     Status: None (Preliminary result)   Collection Time: 08/04/15  5:50 PM  Result Value Ref Range Status   Specimen Description BLOOD LEFT HAND  Final   Special Requests IN PEDIATRIC BOTTLE 1CC  Final   Culture   Final    NO GROWTH 4 DAYS [facility]    Report Status PENDING  Incomplete  Blood Culture (routine x 2)     Status: None (Preliminary result)   Collection Time: 08/04/15  5:57 PM  Result Value Ref Range Status   Specimen Description BLOOD LEFT HAND  Final   Special Requests BOTTLES DRAWN AEROBIC ONLY 5CC  Final   Culture   Final    NO GROWTH 4 DAYS Performed at [facility]    Report Status PENDING  Incomplete  MRSA PCR Screening     Status: None   Collection Time: 08/04/15 10:52 PM  Result Value Ref Range Status   MRSA by PCR NEGATIVE NEGATIVE Final    Comment:        The GeneXpert MRSA Assay (FDA approved for NASAL specimens only), is one component of a comprehensive MRSA colonization surveillance program. It is not intended to diagnose MRSA infection nor to guide or monitor treatment for MRSA infections.    Studies/Results: Dg Cholangiogram Operative  08/09/2015  CLINICAL DATA:  Cholelithiasis, acute cholecystitis EXAM: INTRAOPERATIVE CHOLANGIOGRAM TECHNIQUE: Cholangiographic images from the C-arm fluoroscopic device were submitted for interpretation post-operatively. Please see the procedural report for the amount of contrast and the fluoroscopy time utilized. COMPARISON:   MR 08/07/2015 FINDINGS: No persistent filling defects in the common duct. Intrahepatic ducts are incompletely visualized, appearing decompressed centrally. Contrast passes into the duodenum. : Negative for retained common duct stone. Electronically Signed   By: Corlis Leak  Hassell M.D.   On: 08/09/2015 13:49   Dg Chest Port 1 View  08/09/2015  CLINICAL DATA:  Postop foreign body.  Rule out aspiration of tooth. EXAM: PORTABLE CHEST 1 VIEW COMPARISON:  August 06, 2015 FINDINGS: The cardiomediastinal silhouette is stable. Persistent edema is mildly improved. More focal rounded opacity is seen in the right costophrenic angle, incompletely evaluated. No other interval changes or acute abnormalities.  IMPRESSION: 1. Cardiomegaly and mild edema. 2. Rounded opacity project over the right costophrenic angle. The study was performed to evaluate for an aspirated tooth. A PA and lateral chest x-ray could better evaluate the right costophrenic angle. Electronically Signed   By: Gerome Sam III M.D   On: 08/09/2015 15:24   Medications: I have reviewed the patient's current medications. Scheduled Meds: . amLODipine  5 mg Oral Daily  . buPROPion  100 mg Oral TID  . furosemide  60 mg Intravenous Daily  . mirabegron ER  50 mg Oral Daily  . multivitamin with minerals  1 tablet Oral Daily  . piperacillin-tazobactam (ZOSYN)  IV  3.375 g Intravenous Q8H   Continuous Infusions: . heparin 1,900 Units/hr (08/10/15 0153)   PRN Meds:.acetaminophen, ALPRAZolam, ipratropium-albuterol, morphine injection, oxyCODONE-acetaminophen, technetium TC 52M diethylenetriame-pentaacetic acid Assessment/Plan: Principal Problem:   Cholecystitis, acute Active Problems:   Hyperlipidemia   Severe obesity (BMI >= 40) (HCC)   Major depression, chronic (HCC)   OSA (obstructive sleep apnea)   HTN (hypertension)   GERD   OA (osteoarthritis) of knee   PULMONARY EMBOLISM, HX OF   Pulmonary hypertension (HCC)   Urge incontinence   Cholelithiasis  with cholecystitis   Elevated LFTs   Chronic saddle pulmonary embolism with acute cor pulmonale (HCC)   Essential hypertension   Obesity hypoventilation syndrome (HCC)   Dyspnea   Acute respiratory failure with hypoxia (HCC)   Other fluid overload   Chronic anticoagulation  Ms. Besecker is a 70 yo F with a PMHx of diverticulosis, Hep B, PE, HTN, OSA, HLD, CKD presenting to the hospital complaining of right sided abdominal pain.  Cholecystitis and Cholangitis s/p Cholecystectomy: MRCP shows gallstones without obstructing stones.  Cholecystectomy 6/8 without complication. Heparin restarted 12 hours after surgery.  AST/ALT stable.  Will continue Zosyn for 48 hours after surgery, at which point patient can likely be discharge if tolerating diet and on RA. - Surgery following, appreciate recs.  - GI consult, appreciate rec's - Zosyn (6/3 - 6/10)  BCx NGTD  Pulmonary Edema, improved: Dyspnea likely iatrogenic volume overload due to sepsis protocol fluids.  MRCP noted a "suggestion of mass lesion" thought to be artifact, but CT chest with contrast was recommended to exclude malignancy.  Given her relatively aggressive diuresis and probable artifactual nature of the lesion, will defer CT scan until outpatient follow up. - I/Os - Incentive Spirometry - CT chest with contrast post-discharge  Asymptomatic bacteriuria: >100,000 colonies E coli, sensitive to Zosyn - Zosyn as above  History of PE and Antiphospholipid: On Xarelto at home. - Heparin gtt. Restart Xarelto on discharge.  HTN: Amlodipine 5 mg daily at home. BP stable.  - HOLD PO meds  HLD: Pravastatin 40 mg daily at home - HOLD PO meds  Depression: Wellbutrin 100 mg TID at home.  - HOLD PO meds  Overactive bladder: Mirabegron at home - HOLD PO meds   DVT ppx: Heparin  Diet: clear liquids  Code: Full (confimed with patient)  Dispo: Disposition is deferred at this time, awaiting improvement of current medical problems.   Anticipated discharge in approximately 3-5 day(s).   The patient does have a current PCP [name], MD) and does need an [facility] hospital follow-up appointment after discharge.  The patient does not have transportation limitations that hinder transportation to clinic appointments.  .Services Needed at time of discharge: Y = Yes, Blank = No PT:   OT:   RN:   Equipment:  Other:     LOS: 6 days   Jana Half, MD 08/10/2015, 6:59 AM

## 2015-08-10 NOTE — Procedures (Signed)
Placed patient on CPAP for the night.  Patient is tolerating well at this time. 

## 2015-08-10 NOTE — Anesthesia Postprocedure Evaluation (Signed)
Anesthesia Post Note  Patient: [name]  Procedure(s) Performed: Procedure(s) (LRB): LAPAROSCOPIC CHOLECYSTECTOMY WITH INTRAOPERATIVE CHOLANGIOGRAM (N/A)  Patient location during evaluation: PACU Anesthesia Type: General Level of consciousness: awake Pain management: pain level controlled Vital Signs Assessment: post-procedure vital signs reviewed and stable Respiratory status: spontaneous breathing Cardiovascular status: stable Anesthetic complications: no    Last Vitals:  Filed Vitals:   08/10/15 1500 08/10/15 1810  BP: 120/79 151/82  Pulse: 87 86  Temp: 37.2 C 37.2 C  Resp: 20 20    Last Pain:  Filed Vitals:   08/10/15 1816  PainSc: 2                  EDWARDS,Aleecia Tapia

## 2015-08-10 NOTE — Progress Notes (Signed)
Anesthesiology Follow-up:  As noted [name] underwent laparoscopic cholecystectomy on 08/09/15 by Dr. Davina Pokeornet. Pre-operatively she was noted to have a loose lower front tooth. She was given a dental advisory prior to surgery as well. Anesthetic induction and intubation were noted to be uneventful. However yesterday afternoon following surgery, she coughed up a tooth which she identified as the loose lower tooth noted prior to surgery.  I explained to [name] that the tooth was most likely dislodged during the intubation and that it was fortunate that she coughed it up. She stated that she plans to have all of her teeth pulled and get full dentures. All questions were answered and I gave her my contact information. She expressed no dissatisfaction with her anesthetic care.  Kipp Broodavid Suzie Vandam, MD

## 2015-08-10 NOTE — Discharge Instructions (Signed)
Information on my medicine - XARELTO (rivaroxaban)  This medication education was reviewed with me or my healthcare representative as part of my discharge preparation.  WHY WAS XARELTO PRESCRIBED FOR YOU? Xarelto was prescribed to treat blood clots that may have been found in the veins of your legs (deep vein thrombosis) or in your lungs (pulmonary embolism) and to reduce the risk of them occurring again.  What do you need to know about Xarelto? The starting dose is one 20 mg tablet taken ONCE A DAY with your evening meal.  DO NOT stop taking Xarelto without talking to the health care provider who prescribed the medication.  Refill your prescription for 20 mg tablets before you run out.  After discharge, you should have regular check-up appointments with your healthcare provider that is prescribing your Xarelto.  In the future your dose may need to be changed if your kidney function changes by a significant amount.  What do you do if you miss a dose? If you are taking Xarelto TWICE DAILY and you miss a dose, take it as soon as you remember. You may take two 15 mg tablets (total 30 mg) at the same time then resume your regularly scheduled 15 mg twice daily the next day.  If you are taking Xarelto ONCE DAILY and you miss a dose, take it as soon as you remember on the same day then continue your regularly scheduled once daily regimen the next day. Do not take two doses of Xarelto at the same time.   Important Safety Information Xarelto is a blood thinner medicine that can cause bleeding. You should call your healthcare provider right away if you experience any of the following: Bleeding from an injury or your nose that does not stop. Unusual colored urine (red or dark brown) or unusual colored stools (red or black). Unusual bruising for unknown reasons. A serious fall or if you hit your head (even if there is no bleeding).  Some medicines may interact with Xarelto and might increase  your risk of bleeding while on Xarelto. To help avoid this, consult your healthcare provider or pharmacist prior to using any new prescription or non-prescription medications, including herbals, vitamins, non-steroidal anti-inflammatory drugs (NSAIDs) and supplements.  This website has more information on Xarelto: www.xarelto.com.  

## 2015-08-10 NOTE — Progress Notes (Signed)
Report received from PortiaWhitney, [place] for transfer to [phone]

## 2015-08-10 NOTE — Addendum Note (Signed)
Addendum  created 08/10/15 1953 by Kipp Broodavid Aleyssa Pike, MD   Modules edited: Notes Section   Notes Section:  File: [id number]; Pend: [id number]; Pend: [id number]

## 2015-08-10 NOTE — Progress Notes (Signed)
NURSING PROGRESS NOTE  Patricia DunkerCelestine T [name] [id number] Transfer Data: 08/10/2015 6:17 PM Attending Provider: Levert FeinsteinJames M Granfortuna, MD GEX:BMWUXLK,GMWNUUVOZPCP:BUTCHER,ELIZABETH, MD Code Status: FULL  Patricia DunkerCelestine T [name] is a 70 y.o. female patient transferred from 3S -No acute distress noted.  -No complaints of shortness of breath.  -No complaints of chest pain.   Cardiac Monitoring: Box # [id number] in place. Cardiac monitor yields:normal sinus rhythm.  Blood pressure 151/82, pulse 86, temperature 99 F (37.2 C), temperature source Oral, resp. rate 20, height 5\' 7"  (1.702 m), weight 172.5 kg (380 lb 4.7 oz), SpO2 95 %.   IV Fluids:  IV in place, occlusive dsg intact without redness, IV cath forearm right, condition patent and no redness normal saline and heparin drip  Allergies:  Lisinopril  Past Medical History:   has a past medical history of Depression; Diverticulosis of colon; History of hepatitis B; Hypertension; Arthritis; History of pulmonary embolism (01/2002); Obesity; Fibrocystic breast disease; Obstructive sleep apnea; Hyperlipidemia; Chronic kidney disease; Left shoulder pain; Neck nodule; and Pulmonary nodule.  Past Surgical History:   has past surgical history that includes GYN surgery and Cholecystectomy (N/A, 08/09/2015).  Social History:   reports that she quit smoking about 25 years ago. Her smoking use included Cigarettes. She has a 20 pack-year smoking history. She does not have any smokeless tobacco history on file. She reports that she does not drink alcohol or use illicit drugs.  Skin: lap sites with skin glue clean dry intact.   Patient/Family orientated to room. Information packet given to patient/family. Admission inpatient armband information verified with patient/family to include name and date of birth and placed on patient arm. Side rails up x 2, fall assessment and education completed with patient/family. Patient/family able to verbalize understanding of risk associated with falls and  verbalized understanding to call for assistance before getting out of bed. Call light within reach. Patient/family able to voice and demonstrate understanding of unit orientation instructions.    Will continue to evaluate and treat per MD orders.

## 2015-08-11 LAB — CBC WITH DIFFERENTIAL/PLATELET
BASOS ABS: 0.1 10*3/uL (ref 0.0–0.1)
Basophils Relative: 1 %
EOS ABS: 0.2 10*3/uL (ref 0.0–0.7)
Eosinophils Relative: 2 %
HCT: 42.7 % (ref 36.0–46.0)
Hemoglobin: 13.4 g/dL (ref 12.0–15.0)
LYMPHS PCT: 22 %
Lymphs Abs: 2 10*3/uL (ref 0.7–4.0)
MCH: 25.7 pg — ABNORMAL LOW (ref 26.0–34.0)
MCHC: 31.4 g/dL (ref 30.0–36.0)
MCV: 81.8 fL (ref 78.0–100.0)
MONOS PCT: 7 %
Monocytes Absolute: 0.7 10*3/uL (ref 0.1–1.0)
NEUTROS ABS: 6.3 10*3/uL (ref 1.7–7.7)
NEUTROS PCT: 68 %
PLATELETS: 206 10*3/uL (ref 150–400)
RBC: 5.22 MIL/uL — AB (ref 3.87–5.11)
RDW: 15.6 % — AB (ref 11.5–15.5)
WBC: 9.3 10*3/uL (ref 4.0–10.5)

## 2015-08-11 LAB — COMPREHENSIVE METABOLIC PANEL
ALBUMIN: 3.1 g/dL — AB (ref 3.5–5.0)
ALT: 121 U/L — ABNORMAL HIGH (ref 14–54)
ANION GAP: 12 (ref 5–15)
AST: 75 U/L — ABNORMAL HIGH (ref 15–41)
Alkaline Phosphatase: 138 U/L — ABNORMAL HIGH (ref 38–126)
BILIRUBIN TOTAL: 1.3 mg/dL — AB (ref 0.3–1.2)
BUN: 15 mg/dL (ref 6–20)
CHLORIDE: 96 mmol/L — AB (ref 101–111)
CO2: 26 mmol/L (ref 22–32)
Calcium: 9.4 mg/dL (ref 8.9–10.3)
Creatinine, Ser: 1.03 mg/dL — ABNORMAL HIGH (ref 0.44–1.00)
GFR calc Af Amer: >60 mL/min (ref >60)
GFR calc non Af Amer: 55 mL/min — ABNORMAL LOW (ref >60)
GLUCOSE: 111 mg/dL — AB (ref 65–99)
POTASSIUM: 4 mmol/L (ref 3.5–5.1)
Sodium: 134 mmol/L — ABNORMAL LOW (ref 135–145)
TOTAL PROTEIN: 7.4 g/dL (ref 6.5–8.1)

## 2015-08-11 LAB — HEPARIN LEVEL (UNFRACTIONATED): HEPARIN UNFRACTIONATED: 0.61 [IU]/mL (ref 0.30–0.70)

## 2015-08-11 MED ORDER — SENNOSIDES-DOCUSATE SODIUM 8.6-50 MG PO TABS: 2.0000 | ORAL_TABLET | Freq: Every evening | ORAL | Status: DC | PRN

## 2015-08-11 MED ORDER — POLYETHYLENE GLYCOL 3350 17 G PO PACK: 17.0000 g | PACK | Freq: Every day | ORAL | Status: DC

## 2015-08-11 MED ORDER — OXYCODONE-ACETAMINOPHEN 5-325 MG PO TABS: 1.0000 | ORAL_TABLET | Freq: Four times a day (QID) | ORAL | Status: DC | PRN

## 2015-08-11 MED ORDER — DOCUSATE SODIUM 100 MG PO CAPS
100.0000 mg | ORAL_CAPSULE | Freq: Two times a day (BID) | ORAL | Status: DC
  Administered 2015-08-11: 100 mg via ORAL
  Filled 2015-08-11: qty 1

## 2015-08-11 MED ORDER — POLYETHYLENE GLYCOL 3350 17 G PO PACK
17.0000 g | PACK | Freq: Every day | ORAL | Status: DC
  Administered 2015-08-11: 17 g via ORAL
  Filled 2015-08-11: qty 1

## 2015-08-11 NOTE — Progress Notes (Addendum)
Subjective: No evidence overnight. This morning, she does not report any complaints. She is not interested in going to short-term rehabilitation would like to go home. She denies any excessive pain and would like to have an abdominal binder prior to discharge.  Objective: Vital signs in last 24 hours: Filed Vitals:   08/10/15 1810 08/10/15 2126 08/10/15 2208 08/11/15 0523  BP: 151/82 147/82  148/78  Pulse: 86 83 80 82  Temp: 99 F (37.2 C) 99.3 F (37.4 C)  98.1 F (36.7 C)  TempSrc: Oral Oral  Axillary  Resp: 20 17 17 18   Height:      Weight:    370 lb (167.831 kg)  SpO2: 95% 95% 90% 90%   Weight change: -10 lb 4.7 oz (-4.669 kg)  Intake/Output Summary (Last 24 hours) at 08/11/15 1036 Last data filed at 08/11/15 1030  Gross per 24 hour  Intake    300 ml  Output   1275 ml  Net   -975 ml   Physical Exam  Constitutional: She is oriented to person, place, and time.  Morbidly obese female, lying in bed, NAD.  HENT:  Head: Normocephalic and atraumatic.  Eyes: EOM are normal. No scleral icterus.  Cardiovascular: Normal rate, regular rhythm and normal heart sounds.   Heart sounds distant, but no murmurs or gallops appreciated.  Pulmonary/Chest: Effort normal. No stridor. No respiratory distress. She has no rales.  Mild crackles in bilateral bases.  Abdominal: Soft. She exhibits no distension. There is no tenderness. There is no rebound and no guarding.  Abdomen nontender. Three 1 cm incisions C/D/I without signs of infection.  Musculoskeletal:  Trace pitting edema to midshin.  Neurological: She is alert and oriented to person, place, and time.  Skin: Skin is warm and dry.    Lab Results: Basic Metabolic Panel:  Recent Labs Lab 08/08/15 0407  08/10/15 0403 08/11/15 0527  NA 137  < > 135 134*  K 3.1*  < > 4.8 4.0  CL 96*  < > 102 96*  CO2 28  < > 26 26  GLUCOSE 93  < > 97 111*  BUN 7  < > 11 15  CREATININE 1.06*  < > 1.10* 1.03*  CALCIUM 8.7*  < > 8.8* 9.4  MG  2.1  --   --   --   < > = values in this interval not displayed. Liver Function Tests:  Recent Labs Lab 08/10/15 0403 08/11/15 0527  AST 116* 75*  ALT 131* 121*  ALKPHOS 143* 138*  BILITOT 2.0* 1.3*  PROT 6.6 7.4  ALBUMIN 2.9* 3.1*    Recent Labs Lab 08/04/15 1441 08/06/15 1031  LIPASE 24 14   No results for input(s): AMMONIA in the last 168 hours. CBC:  Recent Labs Lab 08/10/15 0403 08/11/15 0527  WBC 8.7 9.3  NEUTROABS 6.1 6.3  HGB 12.8 13.4  HCT 39.5 42.7  MCV 80.6 81.8  PLT 195 206   Cardiac Enzymes:  Recent Labs Lab 08/04/15 1452  TROPONINI <0.03   CBG:  Recent Labs Lab 08/09/15 0723  GLUCAP 118*   Urinalysis:  Recent Labs Lab 08/04/15 1700  COLORURINE AMBER*  LABSPEC 1.042*  PHURINE 6.0  GLUCOSEU NEGATIVE  HGBUR NEGATIVE  BILIRUBINUR SMALL*  KETONESUR NEGATIVE  PROTEINUR 30*  NITRITE POSITIVE*  LEUKOCYTESUR NEGATIVE   Misc. Labs:   Micro Results: Recent Results (from the past 240 hour(s))  Urine culture     Status: Abnormal   Collection Time: 08/04/15  5:00 PM  Result Value Ref Range Status   Specimen Description URINE, CATHETERIZED  Final   Special Requests NONE  Final   Culture >=100,000 COLONIES/mL ESCHERICHIA COLI (A)  Final   Report Status 08/06/2015 FINAL  Final   Organism ID, Bacteria ESCHERICHIA COLI (A)  Final      Susceptibility   Escherichia coli - MIC*    AMPICILLIN >=32 RESISTANT Resistant     CEFAZOLIN <=4 SENSITIVE Sensitive     CEFTRIAXONE <=1 SENSITIVE Sensitive     CIPROFLOXACIN <=0.25 SENSITIVE Sensitive     GENTAMICIN <=1 SENSITIVE Sensitive     IMIPENEM <=0.25 SENSITIVE Sensitive     NITROFURANTOIN <=16 SENSITIVE Sensitive     TRIMETH/SULFA <=20 SENSITIVE Sensitive     AMPICILLIN/SULBACTAM 16 INTERMEDIATE Intermediate     PIP/TAZO <=4 SENSITIVE Sensitive     * >=100,000 COLONIES/mL ESCHERICHIA COLI  Blood Culture (routine x 2)     Status: None   Collection Time: 08/04/15  5:50 PM  Result Value Ref  Range Status   Specimen Description BLOOD LEFT HAND  Final   Special Requests IN PEDIATRIC BOTTLE 1CC  Final   Culture   Final    NO GROWTH 5 DAYS [facility]    Report Status 08/10/2015 FINAL  Final  Blood Culture (routine x 2)     Status: None   Collection Time: 08/04/15  5:57 PM  Result Value Ref Range Status   Specimen Description BLOOD LEFT HAND  Final   Special Requests BOTTLES DRAWN AEROBIC ONLY 5CC  Final   Culture   Final    NO GROWTH 5 DAYS Performed at [facility]    Report Status 08/10/2015 FINAL  Final  MRSA PCR Screening     Status: None   Collection Time: 08/04/15 10:52 PM  Result Value Ref Range Status   MRSA by PCR NEGATIVE NEGATIVE Final    Comment:        The GeneXpert MRSA Assay (FDA approved for NASAL specimens only), is one component of a comprehensive MRSA colonization surveillance program. It is not intended to diagnose MRSA infection nor to guide or monitor treatment for MRSA infections.    Studies/Results: Dg Cholangiogram Operative  08/09/2015  CLINICAL DATA:  Cholelithiasis, acute cholecystitis EXAM: INTRAOPERATIVE CHOLANGIOGRAM TECHNIQUE: Cholangiographic images from the C-arm fluoroscopic device were submitted for interpretation post-operatively. Please see the procedural report for the amount of contrast and the fluoroscopy time utilized. COMPARISON:  MR 08/07/2015 FINDINGS: No persistent filling defects in the common duct. Intrahepatic ducts are incompletely visualized, appearing decompressed centrally. Contrast passes into the duodenum. : Negative for retained common duct stone. Electronically Signed   By: Corlis Leak  Hassell M.D.   On: 08/09/2015 13:49   Dg Chest Port 1 View  08/09/2015  CLINICAL DATA:  Postop foreign body.  Rule out aspiration of tooth. EXAM: PORTABLE CHEST 1 VIEW COMPARISON:  August 06, 2015 FINDINGS: The cardiomediastinal silhouette is stable. Persistent edema is mildly improved. More focal rounded opacity is  seen in the right costophrenic angle, incompletely evaluated. No other interval changes or acute abnormalities. IMPRESSION: 1. Cardiomegaly and mild edema. 2. Rounded opacity project over the right costophrenic angle. The study was performed to evaluate for an aspirated tooth. A PA and lateral chest x-ray could better evaluate the right costophrenic angle. Electronically Signed   By: Gerome Samavid  Williams III M.D   On: 08/09/2015 15:24   Medications: I have reviewed the patient's current medications. Scheduled Meds: .  amLODipine  5 mg Oral Daily  . buPROPion  100 mg Oral TID  . docusate sodium  100 mg Oral BID  . furosemide  60 mg Intravenous Daily  . mirabegron ER  50 mg Oral Daily  . multivitamin with minerals  1 tablet Oral Daily  . polyethylene glycol  17 g Oral Daily   Continuous Infusions: . heparin 1,900 Units/hr (08/11/15 0437)   PRN Meds:.acetaminophen, ALPRAZolam, ipratropium-albuterol, morphine injection, oxyCODONE-acetaminophen, technetium TC 102M diethylenetriame-pentaacetic acid Assessment/Plan: Principal Problem:   Cholecystitis, acute with cholelithiasis Active Problems:   Hyperlipidemia   Severe obesity (BMI >= 40) (HCC)   Major depression, chronic (HCC)   OSA (obstructive sleep apnea)   HTN (hypertension)   GERD   OA (osteoarthritis) of knee   PULMONARY EMBOLISM, HX OF   Pulmonary hypertension (HCC)   Chronic kidney disease (CKD) stage G2/A1, mildly decreased glomerular filtration rate (GFR) between 60-89 mL/min/1.73 square meter and albuminuria creatinine ratio less than 30 mg/g   Urge incontinence   Obesity hypoventilation syndrome (HCC)   Acute respiratory failure with hypoxia (HCC)   Chronic anticoagulation  Ms. Myhand is a 70 yo F with a PMHx of diverticulosis, Hep B, PE HTN, OSA, HLD, CKD presenting to the hospital complaining of right sided abdominal pain.  Cholecystitis and Cholangitis s/p Cholecystectomy POD 2: MRCP showed gallstones without obstructing  stones. Tolerated some physical activity yesterday though not in agreement with SNF recommendation. Zosyn discontinued yesterday per General Surgery. Blood cultures without growth to date since 6/3. Tolerating solids well. - Abdominal binder per Surgery - Recommended incentive spirometry at discharge  Pulmonary Edema, improved: Dyspnea likely iatrogenic volume overload due to sepsis protocol fluids.  MRCP noted a "suggestion of mass lesion" thought to be artifact, but CT chest with contrast was recommended to exclude malignancy.  Given her relatively aggressive diuresis and probable artifactual nature of the lesion, will defer CT scan until outpatient follow up. - I/Os - Incentive Spirometry - CT chest with contrast post-discharge  Asymptomatic bacteriuria: >100,000 colonies E coli, sensitive to Zosyn. Suspect it should be resolved on IV antibiotic therapy.  History of PE and Antiphospholipid: Restart Xarelto on discharge.  HTN: BP trending 120-151/79-82. Resume amlodipine 5 mg daily at home.   HLD: Resume pravastatin 40 mg daily at home  Depression: Resume Wellbutrin 100 mg TID at home.   Overactive bladder: Resume Mirabegron at home - HOLD PO meds   DVT ppx: Heparin  Diet: Heart Healthy  Code: Full (confimed with patient)  Dispo: Discharge today.  The patient does have a current PCP [name], MD) and does need an [facility] hospital follow-up appointment after discharge.  The patient does not have transportation limitations that hinder transportation to clinic appointments.  .Services Needed at time of discharge: Y = Yes, Blank = No PT:   OT:   RN:   Equipment:   Other:     LOS: 7 days   Beather Arbour, MD 08/11/2015, 10:36 AM

## 2015-08-11 NOTE — Progress Notes (Signed)
Patient was discharged home with home health by MD order; discharged instructions  review and give to patient with care notes and prescriptions; IV DIC; patient will be escorted to the car by nurse tech via wheelchair.  

## 2015-08-11 NOTE — Clinical Social Work Note (Signed)
Met with patient. She does not want to pursue SNF placement at this time. She requests HH. CSW signing off.  Liz Beach MSW, [place], [place], [phone]

## 2015-08-11 NOTE — Progress Notes (Signed)
2 Days Post-Op  Subjective: Sore, tol diet  Objective: Vital signs in last 24 hours: Temp:  [98.1 F (36.7 C)-99.3 F (37.4 C)] 98.1 F (36.7 C) (06/10 0523) Pulse Rate:  [80-87] 82 (06/10 0523) Resp:  [17-22] 18 (06/10 0523) BP: (120-151)/(78-86) 148/78 mmHg (06/10 0523) SpO2:  [90 %-95 %] 90 % (06/10 0523) Weight:  [161.096 kg (370 lb)] 167.831 kg (370 lb) (06/10 0523) Last BM Date: 08/08/15  Intake/Output from previous day: 06/09 0701 - 06/10 0700 In: -  Out: 1450 [Urine:1450] Intake/Output this shift:    GI: soft approp tender incisions clean  Lab Results:   Recent Labs  08/10/15 0403 08/11/15 0527  WBC 8.7 9.3  HGB 12.8 13.4  HCT 39.5 42.7  PLT 195 206   BMET  Recent Labs  08/10/15 0403 08/11/15 0527  NA 135 134*  K 4.8 4.0  CL 102 96*  CO2 26 26  GLUCOSE 97 111*  BUN 11 15  CREATININE 1.10* 1.03*  CALCIUM 8.8* 9.4   PT/INR No results for input(s): LABPROT, INR in the last 72 hours. ABG No results for input(s): PHART, HCO3 in the last 72 hours.  Invalid input(s): PCO2, PO2  Studies/Results: Dg Cholangiogram Operative  08/09/2015  CLINICAL DATA:  Cholelithiasis, acute cholecystitis EXAM: INTRAOPERATIVE CHOLANGIOGRAM TECHNIQUE: Cholangiographic images from the C-arm fluoroscopic device were submitted for interpretation post-operatively. Please see the procedural report for the amount of contrast and the fluoroscopy time utilized. COMPARISON:  MR 08/07/2015 FINDINGS: No persistent filling defects in the common duct. Intrahepatic ducts are incompletely visualized, appearing decompressed centrally. Contrast passes into the duodenum. : Negative for retained common duct stone. Electronically Signed   By: Corlis Leak M.D.   On: 08/09/2015 13:49   Dg Chest Port 1 View  08/09/2015  CLINICAL DATA:  Postop foreign body.  Rule out aspiration of tooth. EXAM: PORTABLE CHEST 1 VIEW COMPARISON:  August 06, 2015 FINDINGS: The cardiomediastinal silhouette is stable.  Persistent edema is mildly improved. More focal rounded opacity is seen in the right costophrenic angle, incompletely evaluated. No other interval changes or acute abnormalities. IMPRESSION: 1. Cardiomegaly and mild edema. 2. Rounded opacity project over the right costophrenic angle. The study was performed to evaluate for an aspirated tooth. A PA and lateral chest x-ray could better evaluate the right costophrenic angle. Electronically Signed   By: Gerome Sam III M.D   On: 08/09/2015 15:24    Anti-infectives: Anti-infectives    Start     Dose/Rate Route Frequency Ordered Stop   08/05/15 1800  vancomycin (VANCOCIN) 1,750 mg in sodium chloride 0.9 % 500 mL IVPB  Status:  Discontinued     1,750 mg 250 mL/hr over 120 Minutes Intravenous Every 24 hours 08/04/15 1740 08/04/15 2337   08/05/15 0000  piperacillin-tazobactam (ZOSYN) IVPB 3.375 g  Status:  Discontinued     3.375 g 12.5 mL/hr over 240 Minutes Intravenous Every 8 hours 08/04/15 1744 08/10/15 0934   08/04/15 1745  vancomycin (VANCOCIN) 2,500 mg in sodium chloride 0.9 % 500 mL IVPB     2,500 mg 250 mL/hr over 120 Minutes Intravenous STAT 08/04/15 1731 08/04/15 2103   08/04/15 1730  piperacillin-tazobactam (ZOSYN) IVPB 3.375 g     3.375 g 100 mL/hr over 30 Minutes Intravenous  Once 08/04/15 1724 08/04/15 1904   08/04/15 1730  vancomycin (VANCOCIN) IVPB 1000 mg/200 mL premix  Status:  Discontinued     1,000 mg 200 mL/hr over 60 Minutes Intravenous  Once 08/04/15 1724 08/04/15  1731      Assessment/Plan: Acute calculous cholecystitis POD#2 laparoscopic cholecystectomy with IOC---Dr. Luisa Hartornett -advance diet, needs to be mobilized more, encourage PO pain meds, IS -will add  Dc home when cleared by medicine Will add abd binder today per pt request  [facility],Diago Haik 08/11/2015

## 2015-08-11 NOTE — Care Management Note (Signed)
Case Management Note  Patient Details  Name: Barrie DunkerCelestine T Portman MRN: [id number] Date of Birth: 01/25/1946  Subjective/Objective:                    Action/Plan:  Plan is to d/c to home today with home health services (PT).   Expected Discharge Date:  08/07/15               Expected Discharge Plan:  Home with home health services  In-House Referral:  NA  Discharge planning Services  CM Consult  Post Acute Care Choice:  Home Health, Resumption of Svcs/PTA Provider Choice offered to:  Patient  DME Arranged:  N/A DME Agency:  NA  HH Arranged:  PT HH Agency:   [facility] [place] [phone]  Status of Service:  Completed, signed off  Medicare Important Message Given:  Yes Date Medicare IM Given:    Medicare IM give by:    Date Additional Medicare IM Given:    Additional Medicare Important Message give by:     If discussed at Long Length of Stay Meetings, dates discussed:    Additional Comments:  Epifanio LeschesCole, Arwa Yero Hudson, [place]RN,BSN,CM [phone] 08/11/2015, 12:09 PM

## 2015-08-11 NOTE — Progress Notes (Signed)
  Date: 08/11/2015  Patient name: Barrie DunkerCelestine T Peale  Medical record number: [id number]  Date of birth: 30-Jun-1945   This patient has been seen and the plan of care was discussed with the house staff. Please see Dr. Eliane DecreePatel's note for complete details. I concur with his findings with the following additions/corrections:   Planned discharge today to home.   Inez CatalinaEmily B Langford Carias, MD 08/11/2015, 12:30 PM

## 2015-08-11 NOTE — Progress Notes (Signed)
NT went to pts room to see if pt had voided and if not would re-bladder scan pt. Pt stated "Can you turn my light off" NT stated "I came in to do your bladder scan". Pt stated "I went a few minutes ago". The NT then asked the pt if she had been cleaned up and the pt told her no, refused peri care, and stated "I do not want to be bothered. I just want to get some sleep". Will follow up with pt.

## 2015-08-13 LAB — BLOOD GAS, ARTERIAL
Acid-Base Excess: 2 mmol/L (ref 0.0–2.0)
Bicarbonate: 26 mEq/L — ABNORMAL HIGH (ref 20.0–24.0)
DRAWN BY: 213381
FIO2: 0.5
O2 SAT: 88.3 %
Patient temperature: 100
TCO2: 27.2 mmol/L (ref 0–100)
pCO2 arterial: 41.4 mmHg (ref 35.0–45.0)
pH, Arterial: 7.418 (ref 7.350–7.450)
pO2, Arterial: 56 mmHg — ABNORMAL LOW (ref 80.0–100.0)

## 2015-08-14 DIAGNOSIS — E119 Type 2 diabetes mellitus without complications: Secondary | ICD-10-CM | POA: Diagnosis not present

## 2015-08-14 DIAGNOSIS — I130 Hypertensive heart and chronic kidney disease with heart failure and stage 1 through stage 4 chronic kidney disease, or unspecified chronic kidney disease: Secondary | ICD-10-CM | POA: Diagnosis not present

## 2015-08-14 DIAGNOSIS — Z48815 Encounter for surgical aftercare following surgery on the digestive system: Secondary | ICD-10-CM | POA: Diagnosis not present

## 2015-08-14 DIAGNOSIS — D631 Anemia in chronic kidney disease: Secondary | ICD-10-CM | POA: Diagnosis not present

## 2015-08-14 DIAGNOSIS — N184 Chronic kidney disease, stage 4 (severe): Secondary | ICD-10-CM | POA: Diagnosis not present

## 2015-08-14 DIAGNOSIS — I5022 Chronic systolic (congestive) heart failure: Secondary | ICD-10-CM | POA: Diagnosis not present

## 2015-08-20 DIAGNOSIS — D631 Anemia in chronic kidney disease: Secondary | ICD-10-CM | POA: Diagnosis not present

## 2015-08-20 DIAGNOSIS — I5022 Chronic systolic (congestive) heart failure: Secondary | ICD-10-CM | POA: Diagnosis not present

## 2015-08-20 DIAGNOSIS — I130 Hypertensive heart and chronic kidney disease with heart failure and stage 1 through stage 4 chronic kidney disease, or unspecified chronic kidney disease: Secondary | ICD-10-CM | POA: Diagnosis not present

## 2015-08-20 DIAGNOSIS — E119 Type 2 diabetes mellitus without complications: Secondary | ICD-10-CM | POA: Diagnosis not present

## 2015-08-20 DIAGNOSIS — N184 Chronic kidney disease, stage 4 (severe): Secondary | ICD-10-CM | POA: Diagnosis not present

## 2015-08-20 DIAGNOSIS — Z48815 Encounter for surgical aftercare following surgery on the digestive system: Secondary | ICD-10-CM | POA: Diagnosis not present

## 2015-08-21 ENCOUNTER — Ambulatory Visit: Payer: Commercial Managed Care - HMO | Admitting: *Deleted

## 2015-08-21 ENCOUNTER — Ambulatory Visit (INDEPENDENT_AMBULATORY_CARE_PROVIDER_SITE_OTHER): Payer: Commercial Managed Care - HMO | Admitting: Internal Medicine

## 2015-08-21 ENCOUNTER — Encounter: Payer: Self-pay | Admitting: Internal Medicine

## 2015-08-21 VITALS — BP 139/66 | HR 90 | Temp 98.6°F | Ht 67.0 in | Wt 361.2 lb

## 2015-08-21 DIAGNOSIS — J9601 Acute respiratory failure with hypoxia: Secondary | ICD-10-CM

## 2015-08-21 DIAGNOSIS — R911 Solitary pulmonary nodule: Secondary | ICD-10-CM

## 2015-08-21 DIAGNOSIS — Z09 Encounter for follow-up examination after completed treatment for conditions other than malignant neoplasm: Secondary | ICD-10-CM | POA: Diagnosis not present

## 2015-08-21 DIAGNOSIS — Z9181 History of falling: Secondary | ICD-10-CM

## 2015-08-21 DIAGNOSIS — Z9049 Acquired absence of other specified parts of digestive tract: Secondary | ICD-10-CM | POA: Diagnosis not present

## 2015-08-21 DIAGNOSIS — W19XXXA Unspecified fall, initial encounter: Secondary | ICD-10-CM

## 2015-08-21 NOTE — Assessment & Plan Note (Signed)
Patient reports one fall when bending over to put the leash on her dog. She denies CP, SOB, palpitations, lightheadedness, or dizziness. She was able to work with PT later that day and has been ambulating with 4 point cane since that time.   A/P: Fall, likely 2/2 deconditioning following acute illness and inability to control her weight when leaning forward.  Patient has home health PT and will continue to follow her progress for improvement. - HH PT

## 2015-08-21 NOTE — Assessment & Plan Note (Addendum)
Patient doing well after cholecystectomy.  She reports her sutures are healing well.  She endorses some diarrhea.  Her diet hasn't been the best, eating ice cream and cake and other fatty foods. She denies abdominal pain, fever, or chills.  She is still taking the Senna she was discharged with.  A/P: s/p Cholecystectomy. Patient recovering well, though is having some diarrhea 2/2 high fat diet and continued Senna.  Patient counseled on need for low fat diet following choley and will switch to Benefiber to bulk the stool. - STOP Senna - Benefiber - Low fat diet.

## 2015-08-21 NOTE — Progress Notes (Signed)
Patient ID: Barrie Dunkerelestine T Muratalla, female   DOB: Jan 04, 1946, 70 y.o.   MRN: [id number]   Subjective:   Patient ID: Barrie DunkerCelestine T Gaubert female   DOB: Jan 04, 1946 70 y.o.   MRN: [id number]  HPI: Ms.Aletheia T Sharol HarnessSimmons is a 70 y.o. female with PMH as below, here for hospital f/u of cholecystitis s/p cholecystectomy.  Please see Problem-Based charting for the status of the patient's chronic medical issues.     Past Medical History  Diagnosis Date  . Depression     followed by Dr. Evelene CroonKaur  . Diverticulosis of colon   . History of hepatitis B   . Hypertension   . Arthritis     osteoarthritis , kness.  [name]. History of pulmonary embolism 01/2002  . Obesity   . Fibrocystic breast disease   . Obstructive sleep apnea     refuses CPAP.  [name]. Hyperlipidemia   . Chronic kidney disease     Renal insuffucinecy, cr-1.33 in 10/2005.  [name]. Left shoulder pain     2/2 fall  . Neck nodule     not erythematous, movable like cyst located at the base of posterior neck on the left side(not painful), will need follow up for   . Pulmonary nodule     Stable on repeat imaging. LLL nodule 5 mm   Current Outpatient Prescriptions  Medication Sig Dispense Refill  . ALPRAZolam (XANAX) 1 MG tablet Take 1 mg by mouth at bedtime as needed for sleep.     [name]. amLODipine (NORVASC) 10 MG tablet Take 0.5 tablets (5 mg total) by mouth daily. 90 tablet 3  . buPROPion (WELLBUTRIN) 100 MG tablet Take 100 mg by mouth 3 (three) times daily.    . hydrochlorothiazide (HYDRODIURIL) 25 MG tablet Take 1 tablet (25 mg total) by mouth daily as needed (edema). 90 tablet 3  . mirabegron ER (MYRBETRIQ) 50 MG TB24 tablet Take 1 tablet (50 mg total) by mouth daily. 90 tablet 3  . Multiple Vitamin (MULTIVITAMIN) tablet Take 1 tablet by mouth daily. 30 tablet 3  . oxyCODONE-acetaminophen (PERCOCET/ROXICET) 5-325 MG tablet Take 1-2 tablets by mouth every 6 (six) hours as needed for moderate pain. 10 tablet 0  . potassium chloride (K-DUR) 10 MEQ tablet  Take 1 tablet (10 mEq total) by mouth 2 (two) times daily. 180 tablet 3  . pravastatin (PRAVACHOL) 40 MG tablet TAKE 1 TABLET EVERY DAY 90 tablet 3  . XARELTO 20 MG TABS tablet TAKE 1 TABLET EVERY DAY WITH SUPPER 90 tablet 3   No current facility-administered medications for this visit.   Family History  Problem Relation Age of Onset  . Hypertension      family history  . Kidney disease Maternal Uncle    Social History   Social History  . Marital Status: Divorced    Spouse Name: N/A  . Number of Children: N/A  . Years of Education: N/A   Social History Main Topics  . Smoking status: Former Smoker -- 1.00 packs/day for 20 years    Types: Cigarettes    Quit date: 03/03/1990  . Smokeless tobacco: None  . Alcohol Use: No  . Drug Use: No  . Sexual Activity: Not Asked   Other Topics Concern  . None   Social History Narrative   Grew up in government housing. Received college education, had career Education officer, community(accountant, Veterinary surgeonrealtor, others), and own home before depression dx. Now back in government housing. Has dog with whom she is close.   Review of Systems: No  dizziness, lightheadedness, CP, SOB, palpitations, or constipation. Patient endorses diarrhea. Objective:  Physical Exam: Filed Vitals:   08/21/15 1557  BP: 139/66  Pulse: 90  Temp: 98.6 F (37 C)  TempSrc: Oral  Height:  (1.702 m)  Weight: 361 lb 3.2 oz (163.839 kg)  SpO2: 100%   Physical Exam  Constitutional: She is oriented to person, place, and time.  Morbidly obese female, sitting in chair, NAD  HENT:  Head: Normocephalic and atraumatic.  Eyes: EOM are normal. No scleral icterus.  Neck: No tracheal deviation present.  Cardiovascular: Normal rate and regular rhythm.   Heart sounds distant, but no murmurs appreciated.  Pulmonary/Chest: Effort normal and breath sounds normal. No stridor. No respiratory distress. She has no wheezes. She has no rales.  Abdominal: Soft. She exhibits no distension. There is no  tenderness. There is no rebound and no guarding.  Laparoscopy scars well appearing, C/D/I.  Neurological: She is alert and oriented to person, place, and time.  Skin: Skin is warm and dry.     Assessment & Plan:   Patient and case were discussed with Dr. Rogelia Boga.  Please refer to Problem Based charting for further documentation.

## 2015-08-21 NOTE — Assessment & Plan Note (Signed)
Patient denies CP, SOB, or orthopnea. She has no complaints with her breathing.  MRCP noted likely artifact in inferior lung zones and recommended CT chest with contrast.  A/P: Pulmonary lesion, likely artifact. - CT chest with contrast

## 2015-08-21 NOTE — Patient Instructions (Signed)
1. STOP taking Senna and Miralax.  Start Benefiber in your drink or food once a day. 2. Decrease the amount of fat you eat in your food, as it will contribute to diarrhea. 3. Return to clinic in 2 months.  Laparoscopic Cholecystectomy Laparoscopic cholecystectomy is surgery to remove the gallbladder. The gallbladder is located in the upper right part of the abdomen, behind the liver. It is a storage sac for bile, which is produced in the liver. Bile aids in the digestion and absorption of fats. Cholecystectomy is often done for inflammation of the gallbladder (cholecystitis). This condition is usually caused by a buildup of gallstones (cholelithiasis) in the gallbladder. Gallstones can block the flow of bile, and that can result in inflammation and pain. In severe cases, emergency surgery may be required. If emergency surgery is not required, you will have time to prepare for the procedure. Laparoscopic surgery is an alternative to open surgery. Laparoscopic surgery has a shorter recovery time. Your common bile duct may also need to be examined during the procedure. If stones are found in the common bile duct, they may be removed. LET [facility] PROVID[facility] KNOW ABOUT:  Any allergies you have.  All medicines you are taking, including vitamins, herbs, eye drops, creams, and over-the-counter medicines.  Previous problems you or members of your family have had with the use of anesthetics.  Any blood disorders you have.  Previous surgeries you have had.  Any medical conditions you have. RISKS AND COMPLICATIONS Generally, this is a safe procedure. However, problems may occur, including:  Infection.  Bleeding.  Allergic reactions to medicines.  Damage to other structures or organs.  A stone remaining in the common bile duct.  A bile leak from the cyst duct that is clipped when your gallbladder is removed.  The need to convert to open surgery, which requires a larger incision in the  abdomen. This may be necessary if your surgeon thinks that it is not safe to continue with a laparoscopic procedure. BEFORE THE PROCEDURE  Ask your health care provider about:  Changing or stopping your regular medicines. This is especially important if you are taking diabetes medicines or blood thinners.  Taking medicines such as aspirin and ibuprofen. These medicines can thin your blood. Do not take these medicines before your procedure if your health care provider instructs you not to.  Follow instructions from your health care provider about eating or drinking restrictions.  Let your health care provider know if you develop a cold or an infection before surgery.  Plan to have someone take you home after the procedure.  Ask your health care provider how your surgical site will be marked or identified.  You may be given antibiotic medicine to help prevent infection. PROCEDURE  To reduce your risk of infection:  Your health care team will wash or sanitize their hands.  Your skin will be washed with soap.  An IV tube may be inserted into one of your veins.  You will be given a medicine to make you fall asleep (general anesthetic).  A breathing tube will be placed in your mouth.  The surgeon will make several small cuts (incisions) in your abdomen.  A thin, lighted tube (laparoscope) that has a tiny camera on the end will be inserted through one of the small incisions. The camera on the laparoscope will send a picture to a TV screen (monitor) in the operating room. This will give the surgeon a good view inside your abdomen.  A gas will be pumped into your abdomen. This will expand your abdomen to give the surgeon more room to perform the surgery.  Other tools that are needed for the procedure will be inserted through the other incisions. The gallbladder will be removed through one of the incisions.  After your gallbladder has been removed, the incisions will be closed with  stitches (sutures), staples, or skin glue.  Your incisions may be covered with a bandage (dressing). The procedure may vary among health care providers and hospitals. AFTER THE PROCEDURE  Your blood pressure, heart rate, breathing rate, and blood oxygen level will be monitored often until the medicines you were given have worn off.  You will be given medicines as needed to control your pain.   This information is not intended to replace advice given to you by your health care provider. Make sure you discuss any questions you have with your health care provider.   Document Released: 02/17/2005 Document Revised: 11/08/2014 Document Reviewed: 09/29/2012 Elsevier Interactive Patient Education [employer]

## 2015-08-22 DIAGNOSIS — I5022 Chronic systolic (congestive) heart failure: Secondary | ICD-10-CM | POA: Diagnosis not present

## 2015-08-22 DIAGNOSIS — N184 Chronic kidney disease, stage 4 (severe): Secondary | ICD-10-CM | POA: Diagnosis not present

## 2015-08-22 DIAGNOSIS — Z48815 Encounter for surgical aftercare following surgery on the digestive system: Secondary | ICD-10-CM | POA: Diagnosis not present

## 2015-08-22 DIAGNOSIS — I130 Hypertensive heart and chronic kidney disease with heart failure and stage 1 through stage 4 chronic kidney disease, or unspecified chronic kidney disease: Secondary | ICD-10-CM | POA: Diagnosis not present

## 2015-08-22 DIAGNOSIS — E119 Type 2 diabetes mellitus without complications: Secondary | ICD-10-CM | POA: Diagnosis not present

## 2015-08-22 DIAGNOSIS — D631 Anemia in chronic kidney disease: Secondary | ICD-10-CM | POA: Diagnosis not present

## 2015-08-22 NOTE — Progress Notes (Signed)
Internal Medicine Clinic Attending  I saw and evaluated the patient.  I personally confirmed the key portions of the history and exam documented by Dr. Ladona Ridgelaylor and I reviewed pertinent patient test results.  The assessment, diagnosis, and plan were formulated together and I agree with the documentation in the resident's note. We discussed her fall and her desire for 2 bedroom apartment. She cannot fit a bedside table in her bedroom bc she has a recliner in that location. We discussed the importance of using her CPAP but she does not want to relocate the recliner. The recliner is not medically necessary and therefore, I cannot supply letter stating that she needs a second bedroom to house the recliner.

## 2015-08-23 ENCOUNTER — Telehealth: Payer: Self-pay

## 2015-08-23 NOTE — Telephone Encounter (Signed)
[name] is a 70 y.o. female who was contacted via telephone for monitoring of rivaroxaban (Xarelto) therapy.    ASSESSMENT Indication(s): PE Duration: indefinite  Labs:    Component Value Date/Time   AST 75* 08/11/2015 0527   ALT 121* 08/11/2015 0527   NA 134* 08/11/2015 0527   NA 142 07/12/2015 0926   K 4.0 08/11/2015 0527   CL 96* 08/11/2015 0527   CO2 26 08/11/2015 0527   GLUCOSE 111* 08/11/2015 0527   GLUCOSE 115* 07/12/2015 0926   HGBA1C 5.7 04/05/2015 1035   HGBA1C 6.2* 10/27/2013 0350   BUN 15 08/11/2015 0527   BUN 15 07/12/2015 0926   CREATININE 1.03* 08/11/2015 0527   CREATININE 1.10 07/27/2014 1105   CALCIUM 9.4 08/11/2015 0527   GFRNONAA 55* 08/11/2015 0527   GFRNONAA 52* 07/27/2014 1105   GFRAA >60 08/11/2015 0527   GFRAA 60 07/27/2014 1105   WBC 9.3 08/11/2015 0527   HGB 13.4 08/11/2015 0527   HCT 42.7 08/11/2015 0527   PLT 206 08/11/2015 0527    rivaroxaban (Xarelto) Dose: 20 mg daily  Safety: Patient has not had recent bleeding/thromboembolic events. Patient reports no recent signs or symptoms of bleeding, no signs of symptoms of thromboembolism. Medication changes: no.   Adherence: Patient reports no known adherence challenges. Patient does correctly recite the dose. Contacted pharmacy and records indicate refills are consistent. Last refills: 03/14/15, 06/18/15 (90 day supply).  Patient Instructions: Patient advised to contact clinic or seek medical attention if signs/symptoms of bleeding or thromboembolism occur. Patient verbalized understanding by repeating back information.  Follow-up 09/03/15 follow up appointment with Internal Med.  Duane LopeHailey L Jamese Trauger PharmD Candidate  08/23/2015, 12:23 PM

## 2015-08-28 DIAGNOSIS — E119 Type 2 diabetes mellitus without complications: Secondary | ICD-10-CM | POA: Diagnosis not present

## 2015-08-28 DIAGNOSIS — I5022 Chronic systolic (congestive) heart failure: Secondary | ICD-10-CM | POA: Diagnosis not present

## 2015-08-28 DIAGNOSIS — N184 Chronic kidney disease, stage 4 (severe): Secondary | ICD-10-CM | POA: Diagnosis not present

## 2015-08-28 DIAGNOSIS — Z48815 Encounter for surgical aftercare following surgery on the digestive system: Secondary | ICD-10-CM | POA: Diagnosis not present

## 2015-08-28 DIAGNOSIS — I130 Hypertensive heart and chronic kidney disease with heart failure and stage 1 through stage 4 chronic kidney disease, or unspecified chronic kidney disease: Secondary | ICD-10-CM | POA: Diagnosis not present

## 2015-08-28 DIAGNOSIS — D631 Anemia in chronic kidney disease: Secondary | ICD-10-CM | POA: Diagnosis not present

## 2015-08-30 ENCOUNTER — Ambulatory Visit (HOSPITAL_COMMUNITY)
Admission: RE | Admit: 2015-08-30 | Discharge: 2015-08-30 | Disposition: A | Discharge status: Home / Self Care | Payer: Commercial Managed Care - HMO | Source: Ambulatory Visit | Attending: Internal Medicine | Admitting: Internal Medicine

## 2015-08-30 ENCOUNTER — Encounter (HOSPITAL_COMMUNITY): Payer: Self-pay

## 2015-08-30 DIAGNOSIS — J432 Centrilobular emphysema: Secondary | ICD-10-CM | POA: Insufficient documentation

## 2015-08-30 DIAGNOSIS — J439 Emphysema, unspecified: Secondary | ICD-10-CM | POA: Diagnosis not present

## 2015-08-30 DIAGNOSIS — J9601 Acute respiratory failure with hypoxia: Secondary | ICD-10-CM | POA: Diagnosis not present

## 2015-08-30 DIAGNOSIS — I2510 Atherosclerotic heart disease of native coronary artery without angina pectoris: Secondary | ICD-10-CM | POA: Diagnosis not present

## 2015-08-30 MED ORDER — IOPAMIDOL (ISOVUE-300) INJECTION 61%
INTRAVENOUS | Status: AC
  Administered 2015-08-30: 75 mL
  Filled 2015-08-30: qty 75

## 2015-08-31 DIAGNOSIS — D631 Anemia in chronic kidney disease: Secondary | ICD-10-CM | POA: Diagnosis not present

## 2015-08-31 DIAGNOSIS — E119 Type 2 diabetes mellitus without complications: Secondary | ICD-10-CM | POA: Diagnosis not present

## 2015-08-31 DIAGNOSIS — N184 Chronic kidney disease, stage 4 (severe): Secondary | ICD-10-CM | POA: Diagnosis not present

## 2015-08-31 DIAGNOSIS — Z48815 Encounter for surgical aftercare following surgery on the digestive system: Secondary | ICD-10-CM | POA: Diagnosis not present

## 2015-08-31 DIAGNOSIS — I130 Hypertensive heart and chronic kidney disease with heart failure and stage 1 through stage 4 chronic kidney disease, or unspecified chronic kidney disease: Secondary | ICD-10-CM | POA: Diagnosis not present

## 2015-08-31 DIAGNOSIS — I5022 Chronic systolic (congestive) heart failure: Secondary | ICD-10-CM | POA: Diagnosis not present

## 2015-09-03 ENCOUNTER — Ambulatory Visit: Payer: Commercial Managed Care - HMO

## 2015-09-03 ENCOUNTER — Other Ambulatory Visit: Payer: Self-pay | Admitting: Internal Medicine

## 2015-09-03 DIAGNOSIS — N184 Chronic kidney disease, stage 4 (severe): Secondary | ICD-10-CM | POA: Diagnosis not present

## 2015-09-03 DIAGNOSIS — D631 Anemia in chronic kidney disease: Secondary | ICD-10-CM | POA: Diagnosis not present

## 2015-09-03 DIAGNOSIS — E119 Type 2 diabetes mellitus without complications: Secondary | ICD-10-CM | POA: Diagnosis not present

## 2015-09-03 DIAGNOSIS — I130 Hypertensive heart and chronic kidney disease with heart failure and stage 1 through stage 4 chronic kidney disease, or unspecified chronic kidney disease: Secondary | ICD-10-CM | POA: Diagnosis not present

## 2015-09-03 DIAGNOSIS — I5022 Chronic systolic (congestive) heart failure: Secondary | ICD-10-CM | POA: Diagnosis not present

## 2015-09-03 DIAGNOSIS — Z48815 Encounter for surgical aftercare following surgery on the digestive system: Secondary | ICD-10-CM | POA: Diagnosis not present

## 2015-09-04 NOTE — Telephone Encounter (Signed)
Pls sch appt with me Nov / Dec HTN F/U HCTZ filled one yr May 2017

## 2015-09-06 DIAGNOSIS — E119 Type 2 diabetes mellitus without complications: Secondary | ICD-10-CM | POA: Diagnosis not present

## 2015-09-06 DIAGNOSIS — D631 Anemia in chronic kidney disease: Secondary | ICD-10-CM | POA: Diagnosis not present

## 2015-09-06 DIAGNOSIS — Z48815 Encounter for surgical aftercare following surgery on the digestive system: Secondary | ICD-10-CM | POA: Diagnosis not present

## 2015-09-06 DIAGNOSIS — I130 Hypertensive heart and chronic kidney disease with heart failure and stage 1 through stage 4 chronic kidney disease, or unspecified chronic kidney disease: Secondary | ICD-10-CM | POA: Diagnosis not present

## 2015-09-06 DIAGNOSIS — N184 Chronic kidney disease, stage 4 (severe): Secondary | ICD-10-CM | POA: Diagnosis not present

## 2015-09-06 DIAGNOSIS — I5022 Chronic systolic (congestive) heart failure: Secondary | ICD-10-CM | POA: Diagnosis not present

## 2015-09-14 ENCOUNTER — Encounter: Payer: Self-pay | Admitting: Internal Medicine

## 2015-09-28 ENCOUNTER — Other Ambulatory Visit: Payer: Self-pay | Admitting: Internal Medicine

## 2015-09-28 DIAGNOSIS — I10 Essential (primary) hypertension: Secondary | ICD-10-CM

## 2015-10-01 NOTE — Telephone Encounter (Signed)
I sent yr supply to [facility] in May

## 2015-11-21 ENCOUNTER — Telehealth: Payer: Self-pay | Admitting: Internal Medicine

## 2015-11-21 NOTE — Telephone Encounter (Signed)
APT. REMINDER CALL, LMTCB °

## 2015-11-22 ENCOUNTER — Ambulatory Visit (INDEPENDENT_AMBULATORY_CARE_PROVIDER_SITE_OTHER): Payer: Commercial Managed Care - HMO | Admitting: Internal Medicine

## 2015-11-22 ENCOUNTER — Encounter: Payer: Self-pay | Admitting: Licensed Clinical Social Worker

## 2015-11-22 DIAGNOSIS — Z86718 Personal history of other venous thrombosis and embolism: Secondary | ICD-10-CM

## 2015-11-22 DIAGNOSIS — Z0000 Encounter for general adult medical examination without abnormal findings: Secondary | ICD-10-CM

## 2015-11-22 DIAGNOSIS — Z6841 Body Mass Index (BMI) 40.0 and over, adult: Secondary | ICD-10-CM

## 2015-11-22 DIAGNOSIS — E785 Hyperlipidemia, unspecified: Secondary | ICD-10-CM

## 2015-11-22 DIAGNOSIS — N3941 Urge incontinence: Secondary | ICD-10-CM

## 2015-11-22 DIAGNOSIS — F329 Major depressive disorder, single episode, unspecified: Secondary | ICD-10-CM

## 2015-11-22 DIAGNOSIS — Z86711 Personal history of pulmonary embolism: Secondary | ICD-10-CM

## 2015-11-22 DIAGNOSIS — Z9049 Acquired absence of other specified parts of digestive tract: Secondary | ICD-10-CM

## 2015-11-22 DIAGNOSIS — Z7901 Long term (current) use of anticoagulants: Secondary | ICD-10-CM

## 2015-11-22 DIAGNOSIS — I10 Essential (primary) hypertension: Secondary | ICD-10-CM

## 2015-11-22 DIAGNOSIS — G4733 Obstructive sleep apnea (adult) (pediatric): Secondary | ICD-10-CM

## 2015-11-22 DIAGNOSIS — Z23 Encounter for immunization: Secondary | ICD-10-CM | POA: Diagnosis not present

## 2015-11-22 DIAGNOSIS — Z87891 Personal history of nicotine dependence: Secondary | ICD-10-CM

## 2015-11-22 DIAGNOSIS — Z9989 Dependence on other enabling machines and devices: Secondary | ICD-10-CM

## 2015-11-22 DIAGNOSIS — Z79899 Other long term (current) drug therapy: Secondary | ICD-10-CM

## 2015-11-22 NOTE — Assessment & Plan Note (Signed)
She is on Norvasc 10 mg and hydrochlorothiazide 25 mg.  BP Readings from Last 3 Encounters:  11/22/15 140/73  08/21/15 139/66  08/11/15 (!) 158/81   PLAN:  Cont current meds

## 2015-11-22 NOTE — Assessment & Plan Note (Signed)
She is not using her CPAP because she cannot have it next to her bedside because of other items in her bedroom.  Plan: Encouraged her to use CPAP

## 2015-11-22 NOTE — Assessment & Plan Note (Signed)
She continues to take her pravastatin 40 mg. She has no side effects.  PLAN:  Cont current meds

## 2015-11-22 NOTE — Assessment & Plan Note (Signed)
She sees a mental health provider who prescribes her Wellbutrin 100 mg 3 times a day.she has also on alprazolam when necessary. She states that today is her son's B day but that he died in October. She states that this is a little bit tougher to deal with right now. She states until recently she has been doing well, washing dishes,cooking, re-potting her plants. She also states that she has been complaining and throwing away items.[facility] had stated that excess belongings were problematic interfering with her ability to be functional.  PLAN:  Cont current meds

## 2015-11-22 NOTE — Assessment & Plan Note (Signed)
She is having to use incontinence supplies every day. She does feel that Mirabegron is helping and would like to stay on this medication. She'll definitely need a bedside toilet but I was informed that there is room in her bedroom for the equipment if she were to remove some of her other belonging.  PLAN:  Cont current meds

## 2015-11-22 NOTE — Assessment & Plan Note (Signed)
She continues to use her xeralto.  PLAN:  Cont current meds,

## 2015-11-22 NOTE — Progress Notes (Signed)
Ms. Lyvers requesting to speak with CSW during her scheduled [facility] appointment.  CSW met with Ms. Skyles following her scheduled [facility] appointment.  Pt states she is advocating for herself to obtain a reasonable accommodation of a 2 bedroom apartment.  Ms. Landau request for an apartment with a tub was approved.  Ms. Quirion discussed with PCP today during visit.  Pt provided CSW with a typed version request for Reasonable Accommodation.  CSW explained to Ms. Rosita Fire, HUD/GHA Regulations prevent requests to be sent on any other forms than the signed approved HUD form.  CSW encouraged Ms. Rosita Fire to contact [facility], Ms. Selena Batten and request a Reasonable Accommodation for 2 bedroom to be sent to both pt's PCP and behavioral health provider.  CSW offered to discuss reasonable accommodation request with pt's behavioral health provider, Ms. Loeb provided contact information to behavioral health provider.  Ms. Hendon requesting a 2 bedroom apartment as the bedrooms as larger and will provide additional space for current medical equipment: Adjustable Queen sized electric bed, CPAP (which pt is not using at this time due to the inability to place the machine on the left side of the bed), Bedside commode, walker and cane.  In addition to the DME listed above, Ms. Battin states she also utilizes an artifical sun floor lamp and rocking chair to help manage her behavioral health symptoms.  Pt aware CSW will need appropriate forms from [facility] prior to moving forward with request.  Ms. Leveille provided with CSW contact information and requested forms to be faxed to [place] attention.  Pt agreeable.

## 2015-11-22 NOTE — Patient Instructions (Signed)
1. Continue all of your medicines 2. I will get blood work next appt 3. You got your flu shot today

## 2015-11-22 NOTE — Assessment & Plan Note (Signed)
Her weight increase from 361-366. She states that she is eating better. We discussed how her weight is impacting her health.  PLAN : cont encouragement

## 2015-11-22 NOTE — Assessment & Plan Note (Signed)
She states that she cannot sleep more than 4-5 hours at a time. However she is able to fall back asleep and is getting overall quality of sleep. She normally wakes up because she has to go to the bathroom. She drinking 1 gallon of ice cold water a day.  She had gotten a copy of her CT chest report..She was concerned because it was read as mild emphysematous changes.it was also read as mild coronary artery atherosclerosis. I discussed the implications of reading too much into these 2 diagnoses. She is a former smoker but I explained that any damage from the smoking is not going to progress but that she will get a continual slight decrease in her breathing just due to aging.  She also brought up her desire that I write a statement that it is medically necessary that she had a 2 bedroom apartment. She wants a second bedroom so that she can move her rocker and a lamp into the second bedroom so that she can then have room for a nightstand and her CPAP and her bedside commode. I explained that I cannot certify that a rocker and lamp are medically necessary and if she feels that it is necessary for her depression that she needs to get her psychiatrist to write this letter.  I discussed that her LFTs have always been normal until June when she had gallbladder issues. We have not documented normalization of her LFTs after her cholecystectomy. I stated I was not overly concerned but I will check LFTs and a hepatitis B surface antibody at her next blood draw.  I encouraged her to schedule a follow-up appointment in 6 months.

## 2015-11-22 NOTE — Progress Notes (Signed)
   Subjective:    Patient ID: [name], female    DOB: 23-Aug-1945, 70 y.o.   MRN: [id number]  HPI  [name] is here for HTN F/U. Please see the A&P for the status of the pt's chronic medical problems.  ROS : per ROS section and in problem oriented charting. All other systems are negative.  PMHx, Soc hx, and / or Fam hx : She lives in subsidized housing in a 1 bedroom apartment.she sees a mental health provider but has to pay out of pocket for this service as her provider does not accept her insurance and she does not want to change providers.  Review of Systems  Constitutional: Positive for unexpected weight change.  Respiratory: Positive for cough.        +DOE  Cardiovascular: Negative for chest pain.  Genitourinary:       + urge and stress incontinence + nocturia  Musculoskeletal: Negative for arthralgias and gait problem.  Psychiatric/Behavioral: Positive for behavioral problems, dysphoric mood and sleep disturbance.       Objective:   Physical Exam  Constitutional: She appears well-developed and well-nourished. No distress.  HENT:  Head: Normocephalic and atraumatic.  Right Ear: External ear normal.  Left Ear: External ear normal.  Nose: Nose normal.  Eyes: Conjunctivae and EOM are normal.  Skin: She is not diaphoretic.  Psychiatric: She has a normal mood and affect. Her speech is normal and behavior is normal. Judgment and thought content normal. Cognition and memory are normal.          Assessment & Plan:

## 2015-12-28 ENCOUNTER — Other Ambulatory Visit: Payer: Self-pay | Admitting: Internal Medicine

## 2016-01-29 ENCOUNTER — Telehealth: Payer: Self-pay | Admitting: Internal Medicine

## 2016-01-29 DIAGNOSIS — R69 Illness, unspecified: Secondary | ICD-10-CM | POA: Diagnosis not present

## 2016-01-29 NOTE — Telephone Encounter (Signed)
Rec'd call from Medical Student [facility] requesting information abou a medication for patient's dental procedure.  Please call him back.

## 2016-01-29 NOTE — Telephone Encounter (Signed)
Student called back, informed he needed a release of information from pt, he did not know what that is, explained and referred him to either his office manager, clinic supervisor or his immediate attending. Gave him fax# and ph#

## 2016-01-29 NOTE — Telephone Encounter (Signed)
Lm for rtc 

## 2016-02-08 NOTE — Telephone Encounter (Signed)
Per dr Midwifebutcher I have called pt and given her instructions, pt to have procedure 12/14 She is to HOLD xarelto on the evening of the 13th If bleeding is acceptable, normal on the 14th she may resume the xarelto that evening Pt repeated the instructions back to me and I reinforced by repeating to pt Faxed letter to [facility] at [phone] Gave letter to chilon boone for medical records

## 2016-02-14 DIAGNOSIS — R69 Illness, unspecified: Secondary | ICD-10-CM | POA: Diagnosis not present

## 2016-03-04 DIAGNOSIS — R69 Illness, unspecified: Secondary | ICD-10-CM | POA: Diagnosis not present

## 2016-03-30 ENCOUNTER — Other Ambulatory Visit: Payer: Self-pay | Admitting: Internal Medicine

## 2016-05-06 DIAGNOSIS — H25013 Cortical age-related cataract, bilateral: Secondary | ICD-10-CM | POA: Diagnosis not present

## 2016-05-14 ENCOUNTER — Telehealth: Payer: Self-pay | Admitting: Internal Medicine

## 2016-05-14 NOTE — Telephone Encounter (Signed)
APT. REMINDER CALL, LMTCB °

## 2016-05-15 ENCOUNTER — Ambulatory Visit (INDEPENDENT_AMBULATORY_CARE_PROVIDER_SITE_OTHER): Payer: Commercial Managed Care - HMO | Admitting: Internal Medicine

## 2016-05-15 DIAGNOSIS — R197 Diarrhea, unspecified: Secondary | ICD-10-CM | POA: Diagnosis not present

## 2016-05-15 DIAGNOSIS — Z87891 Personal history of nicotine dependence: Secondary | ICD-10-CM

## 2016-05-15 DIAGNOSIS — I10 Essential (primary) hypertension: Secondary | ICD-10-CM

## 2016-05-15 DIAGNOSIS — K5900 Constipation, unspecified: Secondary | ICD-10-CM | POA: Diagnosis not present

## 2016-05-15 DIAGNOSIS — F329 Major depressive disorder, single episode, unspecified: Secondary | ICD-10-CM

## 2016-05-15 DIAGNOSIS — F339 Major depressive disorder, recurrent, unspecified: Secondary | ICD-10-CM | POA: Diagnosis not present

## 2016-05-15 DIAGNOSIS — E785 Hyperlipidemia, unspecified: Secondary | ICD-10-CM

## 2016-05-15 DIAGNOSIS — N3941 Urge incontinence: Secondary | ICD-10-CM

## 2016-05-15 DIAGNOSIS — H539 Unspecified visual disturbance: Secondary | ICD-10-CM

## 2016-05-15 DIAGNOSIS — Z6841 Body Mass Index (BMI) 40.0 and over, adult: Secondary | ICD-10-CM

## 2016-05-15 DIAGNOSIS — Z7901 Long term (current) use of anticoagulants: Secondary | ICD-10-CM

## 2016-05-15 DIAGNOSIS — R251 Tremor, unspecified: Secondary | ICD-10-CM | POA: Diagnosis not present

## 2016-05-15 DIAGNOSIS — R350 Frequency of micturition: Secondary | ICD-10-CM | POA: Diagnosis not present

## 2016-05-15 DIAGNOSIS — K08109 Complete loss of teeth, unspecified cause, unspecified class: Secondary | ICD-10-CM

## 2016-05-15 DIAGNOSIS — R109 Unspecified abdominal pain: Secondary | ICD-10-CM | POA: Diagnosis not present

## 2016-05-15 DIAGNOSIS — Z86718 Personal history of other venous thrombosis and embolism: Secondary | ICD-10-CM

## 2016-05-15 DIAGNOSIS — M7989 Other specified soft tissue disorders: Secondary | ICD-10-CM | POA: Diagnosis not present

## 2016-05-15 DIAGNOSIS — Z79899 Other long term (current) drug therapy: Secondary | ICD-10-CM

## 2016-05-15 DIAGNOSIS — Z86711 Personal history of pulmonary embolism: Secondary | ICD-10-CM

## 2016-05-15 MED ORDER — AMLODIPINE BESYLATE 10 MG PO TABS: 10.0000 mg | ORAL_TABLET | Freq: Every day | ORAL | 3 refills | Status: DC

## 2016-05-15 NOTE — Assessment & Plan Note (Signed)
This problem is chronic and stable. She takes mirabegron  50 daily. She is not dependent on adult underwear. [facility] and losing urine which soaked through the adult underwear and soaked her car seat. She does feel that the medication is providing some relief and would like to continue it. She is having no side effects to the medication.  PLAN:  Cont current meds

## 2016-05-15 NOTE — Patient Instructions (Addendum)
1. I will look into whether tumeric and garlic interferes with xarelto 2. See me in 1 month 3. Take one full novasc pill 4. BP goal is less than 140/90

## 2016-05-15 NOTE — Assessment & Plan Note (Signed)
This problem is chronic and stable. Her regimen is amlodipine 5 mg and HCTZ 25.  She does have urinary frequency and incontinence although it is doubtful it is related to the HCTZ. Due to uncontrolled hypertension I am going to increase her amlodipine to 10 mg.  [name].PLAN :  Increase amlodipine to 10 Return to clinic in 1 month

## 2016-05-15 NOTE — Assessment & Plan Note (Signed)
This problem is chronic and stable. She thought her weight would be down as she feels her clothes are a little loose recently. Overall her weight is stable. I offered her referral to [name] today and she declined. She recently had all of her teeth pulled and has been taking mostly Progresso soups up to 4 cans a day and "skins" because so called Dr. Neil Crouchz said this was the healthiest snack out there. She also eats potatoes and sour cream and oatmeal cookies that she makes. Her diet certainly is not married nor the healthiest. She was screened for diabetes and February 2017 her A1c was 5.7.  PLAN :  Continue to follow and offer nutrition referral  Wt Readings from Last 3 Encounters:  05/15/16 (!) 366 lb 6.4 oz (166.2 kg)  11/22/15 (!) 366 lb 3.2 oz (166.1 kg)  08/21/15 (!) 361 lb 3.2 oz (163.8 kg)

## 2016-05-15 NOTE — Progress Notes (Signed)
   Subjective:    Patient ID: [name], female    DOB: 1946-02-07, 71 y.o.   MRN: [id number]  HPI  [name] is here for HTN F/U. Please see the A&P for the status of the pt's chronic medical problems.  ROS : per ROS section and in problem oriented charting. All other systems are negative.  PMHx, Soc hx, and / or Fam hx : Her psychiatrist is weaning her off of xanax and plans to start trazodone. [place] [facility].  Review of Systems  Eyes: Positive for visual disturbance.  Cardiovascular: Positive for leg swelling.  Gastrointestinal: Positive for abdominal pain, constipation and diarrhea. Negative for anal bleeding.  Hematological: Does not bruise/bleed easily.       Objective:   Physical Exam  Constitutional: She is oriented to person, place, and time. She appears well-developed and well-nourished. No distress.  HENT:  Head: Normocephalic and atraumatic.  Right Ear: External ear normal.  Left Ear: External ear normal.  Nose: Nose normal.  Eyes: Conjunctivae and EOM are normal.  Cardiovascular: Normal rate, regular rhythm and normal heart sounds.   Pulmonary/Chest: Effort normal.  Abdominal: Soft. Bowel sounds are normal.  Musculoskeletal: She exhibits edema. She exhibits no tenderness or deformity.  Trace edema B  Neurological: She is alert and oriented to person, place, and time.  Fine tremor hands and face  Skin: Skin is warm and dry. She is not diaphoretic.  Psychiatric: She has a normal mood and affect. Her behavior is normal. Judgment and thought content normal.          Assessment & Plan:

## 2016-05-15 NOTE — Assessment & Plan Note (Signed)
This problem is chronic and stable. She is on pravastatin 40 mg a moderate intensity statin for primary prevention. She has no side effects to this medication.  PLAN:  Cont current meds

## 2016-05-15 NOTE — Assessment & Plan Note (Addendum)
This problem is chronic and stable. She is seeing her psychiatrist every 6 months who has started a xanax taper. The patient also states that she will be started on trazodone at some point in the not too distant future. Once she is off the Xanax, she will only need to see her psychiatrist yearly. The patient ask whether I will be prescribing the trazodone and I informed her that would be via her psychiatrist.  PLAN : cont to follow pysch's rescs. Meds to be refilled by pysch

## 2016-05-15 NOTE — Assessment & Plan Note (Signed)
Problem is chronic and stable. She is on lifelong anticoagulation and is currently on Xarelto without any bleeding or side effects. I will continue this medication lifelong as long as the benefits outweigh the risk.  PLAN:  Cont current meds

## 2016-06-12 ENCOUNTER — Ambulatory Visit (INDEPENDENT_AMBULATORY_CARE_PROVIDER_SITE_OTHER): Payer: Medicare HMO | Admitting: Internal Medicine

## 2016-06-12 DIAGNOSIS — Z79899 Other long term (current) drug therapy: Secondary | ICD-10-CM

## 2016-06-12 DIAGNOSIS — F329 Major depressive disorder, single episode, unspecified: Secondary | ICD-10-CM

## 2016-06-12 DIAGNOSIS — Z87891 Personal history of nicotine dependence: Secondary | ICD-10-CM

## 2016-06-12 DIAGNOSIS — F339 Major depressive disorder, recurrent, unspecified: Secondary | ICD-10-CM | POA: Diagnosis not present

## 2016-06-12 DIAGNOSIS — I10 Essential (primary) hypertension: Secondary | ICD-10-CM | POA: Diagnosis not present

## 2016-06-12 DIAGNOSIS — Z0279 Encounter for issue of other medical certificate: Secondary | ICD-10-CM | POA: Diagnosis not present

## 2016-06-12 DIAGNOSIS — Z0000 Encounter for general adult medical examination without abnormal findings: Secondary | ICD-10-CM

## 2016-06-12 NOTE — Assessment & Plan Note (Signed)
Today she asked again for a letter regarding her desire to get a 2 bedroom apartment. As I have documented before, it is medically necessary that she has adequate space for her bed, bedside toilet, CPAP machine, and her cane. I have sent physical therapy and [facility] to her home to evaluate the living situation and [facility] reported that she had a lot of items in her bedroom which prevented her from having a nightstand with her CPAP on it and her bedside commode next to the bed. At one appointment, the patient stated she needed a second bedroom so that she can move her rocking chair and her sunlamp into the second bedroom so that she would then have room in her bedroom for her CPAP machine. I explained to her that I cannot provide a letter stating this as I am not treating her depression and I have not looked into the literature regarding using a sunlamp to treat depression. Today the patient states that her psychiatrist supplied her a letter stating that the sunlamp in the rocker were medically necessary to treat her depression. She requested that I complete the housing authority form stating that she needs her electric bed, CPAP, bedside toilet, walker, and cane. I have no problem doing so that I have not received the housing authority form.  In addition she wants a letter stating the medical city of a bathtub. Physical therapy had evaluated in her home in March 2017 as I was concerned about her ability to safely enter and exit a wet tub. They suggested that she install a bidet for her toilet to facilitate cleaning which she declined. Physical therapy then took her next door to her neighbor's apartment and patient was able to demonstrate that she can enter and exit a tub safely and stated that she could safely use a tub if it was the exact size as her neighbors and had the exact same grab bars. I will be happy to state this and the Housing Authority's letter.  I will not be able to use the explicit language that she  medically requires a 2 bedroom apartment.

## 2016-06-12 NOTE — Assessment & Plan Note (Signed)
This problem is chronic and uncontrolled. She is on HCTZ 25 and amlodipine 10. Today's visit was a return visit to see what her blood pressure is doing after having her amlodipine dose maxed. Her blood pressure still uncontrolled and patient states it is because she is stressed over her apartment situation. She thinks that if she can get her apartment straightened out and get into a 2 bedroom apartment that her blood pressure will be lower. She states that she cannot enjoy her apartment currently. I discussed that her mental state is unlikely to change significantly in the near future and that blood pressure can increase as we age.  Plan : Continue current medication Recheck blood pressure in 3 months If elevated at that time, she will need a third agent

## 2016-06-12 NOTE — Progress Notes (Signed)
   Subjective:    Patient ID: [name], female    DOB: 1946/02/07, 71 y.o.   MRN: [id number]  HPI  [name] is here for HTN F/U. Please see the A&P for the status of the pt's chronic medical problems.  ROS : per ROS section and in problem oriented charting. All other systems are negative.  PMHx, Soc hx, and / or Fam hx : She lives in a one bedroom apartment that is seeking a 2 bedroom apartment. She requires the use of a bedside toilet, CPAP machine, cane. She also uses a sun lamp which is not a medical prescription.  Review of Systems  Constitutional: Negative for unexpected weight change.  HENT: Positive for dental problem. Negative for mouth sores.   Psychiatric/Behavioral: The patient is nervous/anxious.        Objective:   Physical Exam  Constitutional: She appears well-developed and well-nourished. No distress.  HENT:  Head: Normocephalic and atraumatic.  Right Ear: External ear normal.  Left Ear: External ear normal.  Nose: Nose normal.  Eyes: Conjunctivae and EOM are normal.  Skin: She is not diaphoretic.  Psychiatric: She has a normal mood and affect. Her behavior is normal. Judgment and thought content normal.          Assessment & Plan:

## 2016-06-12 NOTE — Patient Instructions (Signed)
1. Please see me 3 months 2. I will complete forms when I receive them

## 2016-06-12 NOTE — Assessment & Plan Note (Signed)
This problem is chronic and stable. She continues to see her psychiatrist and states the psychiatrist has applied a note stating that her rocking chair and her sunlamp are medically necessary.  PLAN : Continue to follow psychiatry's notes

## 2016-07-01 ENCOUNTER — Other Ambulatory Visit: Payer: Self-pay | Admitting: Internal Medicine

## 2016-07-07 ENCOUNTER — Other Ambulatory Visit: Payer: Self-pay

## 2016-07-07 DIAGNOSIS — I10 Essential (primary) hypertension: Secondary | ICD-10-CM

## 2016-07-07 NOTE — Telephone Encounter (Signed)
Ome yr supply ordered in [facility]

## 2016-07-08 ENCOUNTER — Other Ambulatory Visit: Payer: Self-pay | Admitting: Internal Medicine

## 2016-07-08 NOTE — Telephone Encounter (Signed)
Call patient back to come to clinic to pick up samples from Dr. Selena BattenKim stated she is on her way will park at ER and call when she gets here

## 2016-07-08 NOTE — Telephone Encounter (Signed)
Call from patient reports that she missed taking xarelto yesterday is waiting on mail order would [facility]

## 2016-07-09 ENCOUNTER — Other Ambulatory Visit: Payer: Self-pay | Admitting: Pharmacist

## 2016-07-09 NOTE — Progress Notes (Signed)
Medication Samples have been provided to the patient.  Drug: rivaroxaban (Xarelto) Strength: 20 mg Qty: 1 bottle LOT: [id number] Exp.Date: April 2020 Dosing instructions: 1 tablet daily  The patient has been instructed regarding the correct time, dose, and frequency of taking this medication, including desired effects and most common side effects.   Marzetta BoardJennifer Eleasha Cataldo 3:46 PM 07/09/2016

## 2016-07-11 IMAGING — CT CT CHEST W/ CM
2 of 3 series · 15 of 36 positions shown, 18 images · IV contrast (Omni 300)
Comparison: 11/09/2015 and prior radiographs, 10/26/2013 CT, and
08/07/2015 abdominal MR

CLINICAL DATA: 70-year-old female with possible right lung mass
identified on recent abdominal MR. History of respiratory failure
and hypoxia.

EXAM:
CT CHEST WITH CONTRAST
TECHNIQUE: Multidetector CT imaging of the chest was performed during
intravenous contrast administration.
CONTRAST:  75mL YQ72LY-QRR IOPAMIDOL (YQ72LY-QRR) INJECTION 61%

[Series 2: chest with 2mm st · axial · 0.73mm/px · z∈[+1271,+1531]mm · 12 of 154 slices shown, 15 images]
[im 12/154  mediastinal]
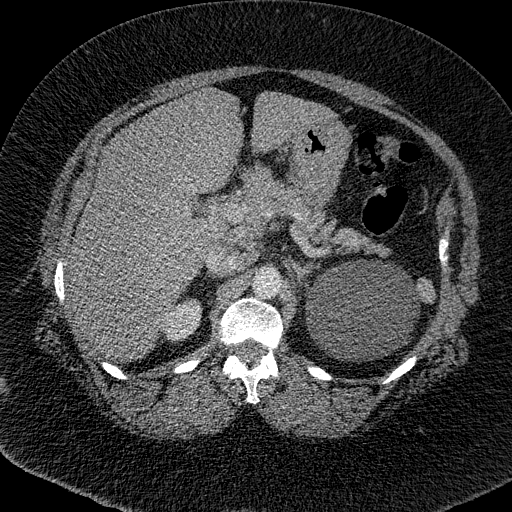
[im 12/154  lung]
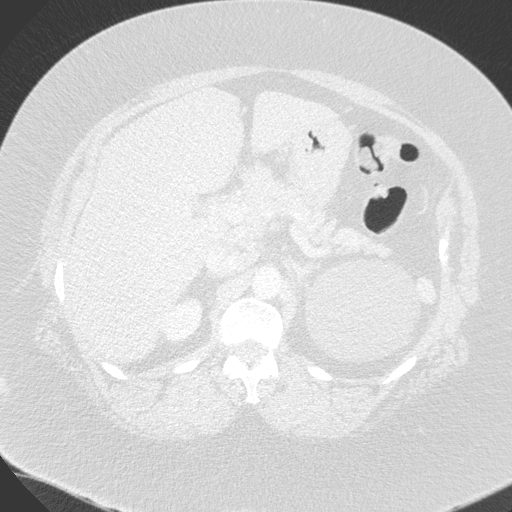
[im 23/154  lung]
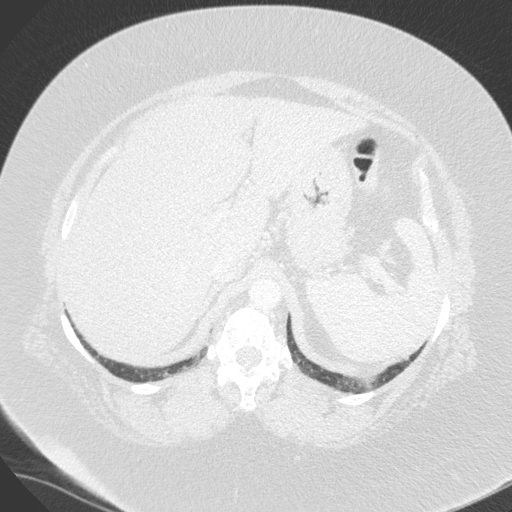
[im 35/154  lung]
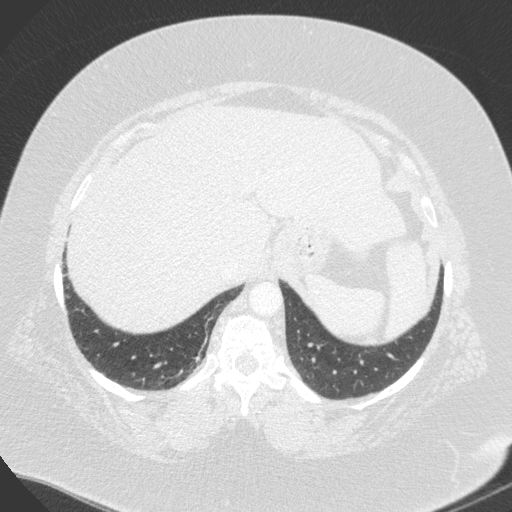
[im 46/154  lung]
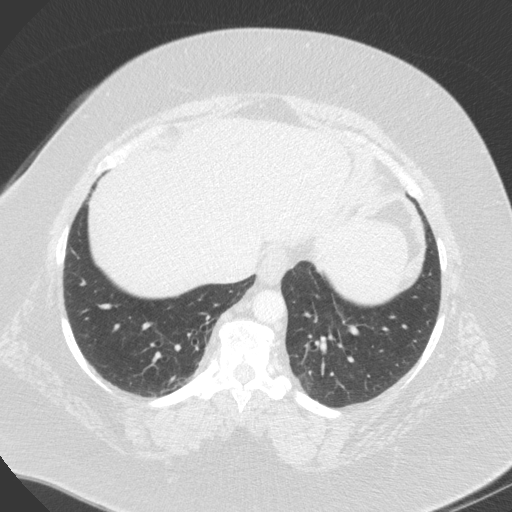
[im 57/154  mediastinal]
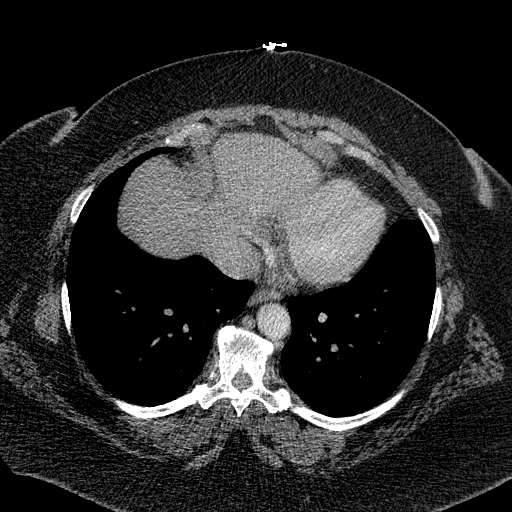
[im 57/154  lung]
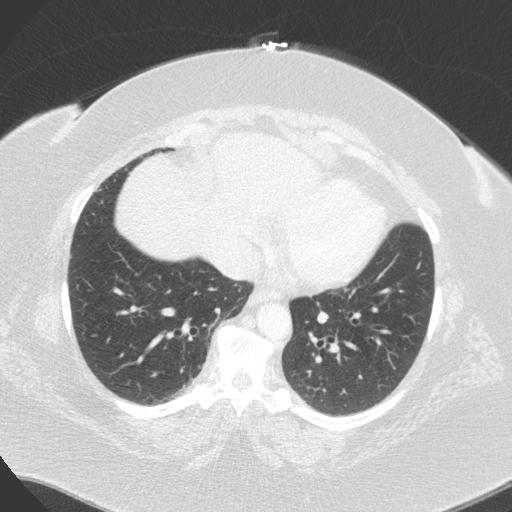
[im 69/154  lung]
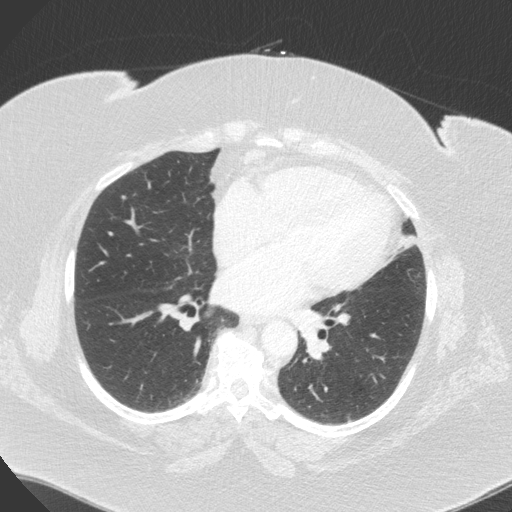
[im 86/154  lung]
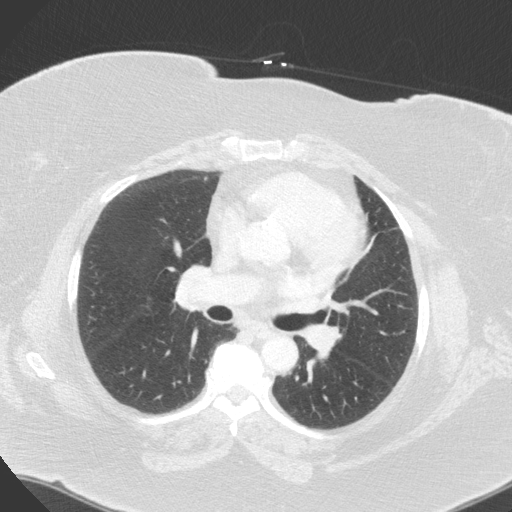
[im 97/154  lung]
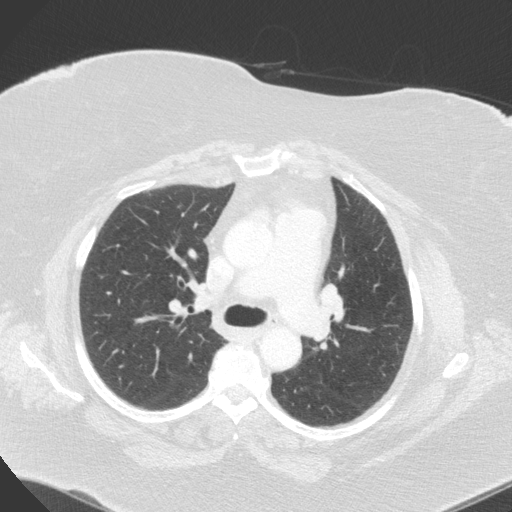
[im 108/154  mediastinal]
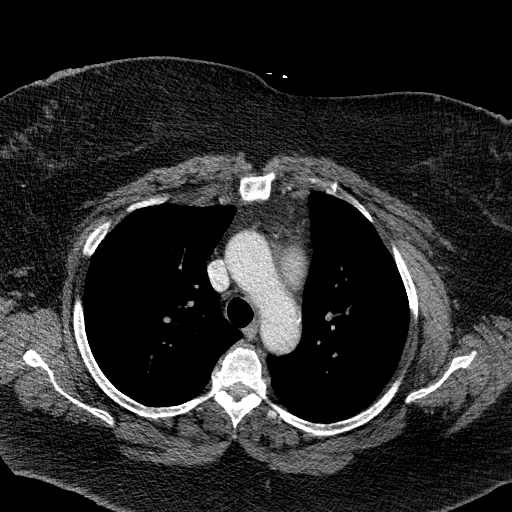
[im 108/154  lung]
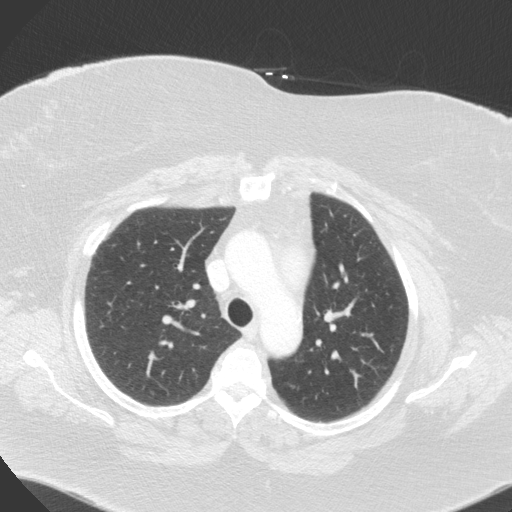
[im 120/154  lung]
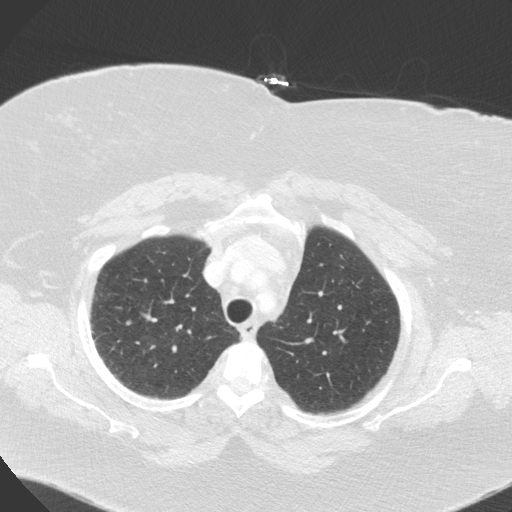
[im 131/154  lung]
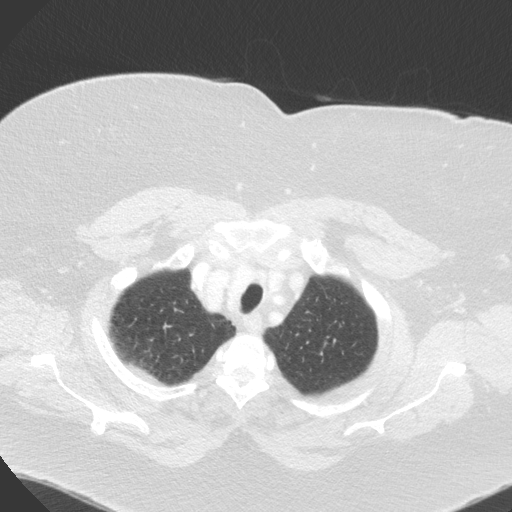
[im 142/154  lung]
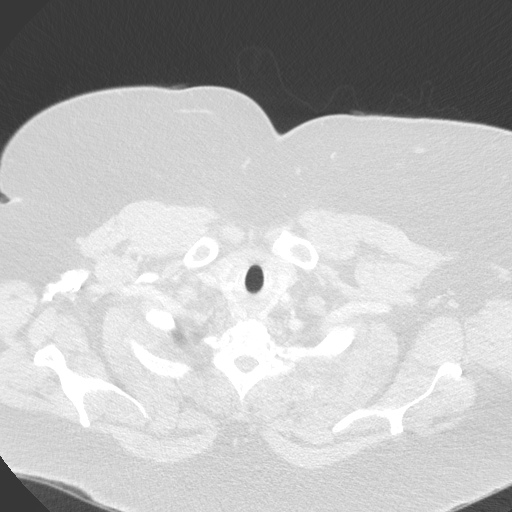

[Series 602: coronal · coronal · 0.73mm/px · 3 of 135 slices shown]
[im 27/135  lung]
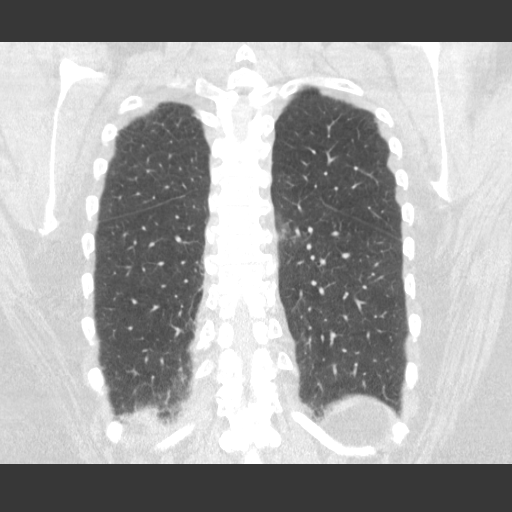
[im 54/135  lung]
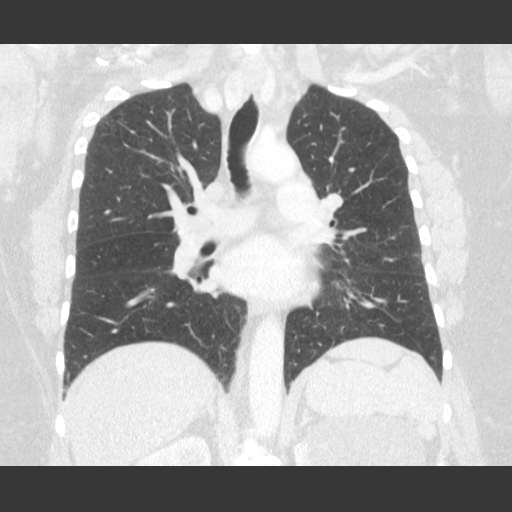
[im 81/135  lung]
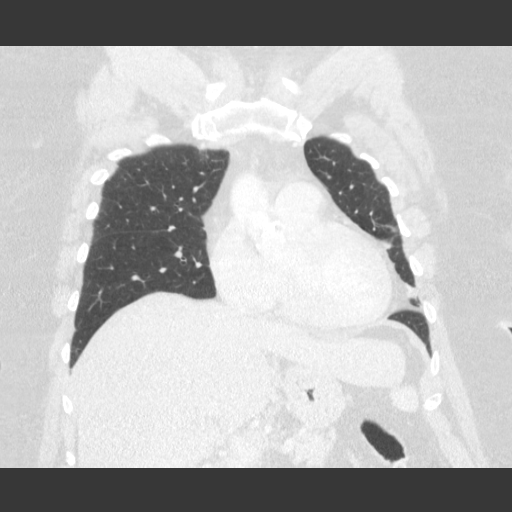

[15 of 36 positions shown; findings below may reference images not displayed]

FINDINGS: Cardiovascular: There is no evidence of thoracic aortic aneurysm.
Mild coronary artery calcifications again noted.

Mediastinum/Nodes: No enlarged lymph nodes, mediastinal mass or
pericardial effusion.

Lungs/Pleura: Mild centrilobular emphysema is identified. A 4 mm
left lower lobe nodule (image 101) is unchanged from 10/26/2013.

There is no evidence of pulmonary mass, suspicious nodule, airspace
disease or consolidation. No right lung abnormality is noted in the
area identified on recent MR.

Upper Abdomen: No acute abnormality. A large left renal cyst is
again noted.

Musculoskeletal: No acute or suspicious abnormalities.
IMPRESSION: No right lung abnormality in the area questioned on recent abdominal
MR.

Mild centrilobular emphysema.

Coronary artery disease.

## 2016-08-27 ENCOUNTER — Encounter: Payer: Self-pay | Admitting: Internal Medicine

## 2016-08-27 DIAGNOSIS — K760 Fatty (change of) liver, not elsewhere classified: Secondary | ICD-10-CM | POA: Insufficient documentation

## 2016-08-28 ENCOUNTER — Ambulatory Visit (INDEPENDENT_AMBULATORY_CARE_PROVIDER_SITE_OTHER): Payer: Medicare HMO | Admitting: Internal Medicine

## 2016-08-28 VITALS — BP 144/66 | HR 86 | Temp 98.2°F | Wt 377.8 lb

## 2016-08-28 DIAGNOSIS — Z79899 Other long term (current) drug therapy: Secondary | ICD-10-CM

## 2016-08-28 DIAGNOSIS — R635 Abnormal weight gain: Secondary | ICD-10-CM | POA: Diagnosis not present

## 2016-08-28 DIAGNOSIS — F332 Major depressive disorder, recurrent severe without psychotic features: Secondary | ICD-10-CM

## 2016-08-28 DIAGNOSIS — F329 Major depressive disorder, single episode, unspecified: Secondary | ICD-10-CM | POA: Diagnosis not present

## 2016-08-28 DIAGNOSIS — I129 Hypertensive chronic kidney disease with stage 1 through stage 4 chronic kidney disease, or unspecified chronic kidney disease: Secondary | ICD-10-CM

## 2016-08-28 DIAGNOSIS — N182 Chronic kidney disease, stage 2 (mild): Secondary | ICD-10-CM | POA: Diagnosis not present

## 2016-08-28 DIAGNOSIS — K760 Fatty (change of) liver, not elsewhere classified: Secondary | ICD-10-CM | POA: Diagnosis not present

## 2016-08-28 DIAGNOSIS — I10 Essential (primary) hypertension: Secondary | ICD-10-CM

## 2016-08-28 DIAGNOSIS — Z87891 Personal history of nicotine dependence: Secondary | ICD-10-CM

## 2016-08-28 DIAGNOSIS — Z8619 Personal history of other infectious and parasitic diseases: Secondary | ICD-10-CM | POA: Diagnosis not present

## 2016-08-28 LAB — GLUCOSE, CAPILLARY: Glucose-Capillary: 106 mg/dL — ABNORMAL HIGH (ref 65–99)

## 2016-08-28 LAB — POCT GLYCOSYLATED HEMOGLOBIN (HGB A1C): Hemoglobin A1C: 6

## 2016-08-28 MED ORDER — OLMESARTAN MEDOXOMIL 20 MG PO TABS: 20.0000 mg | ORAL_TABLET | Freq: Every day | ORAL | 1 refills | Status: DC

## 2016-08-28 NOTE — Assessment & Plan Note (Signed)
This problem is chronic and uncontrolled. She is tearful today and states that when something frustrates her (not getting the 2 bedroom) but she doesn't understand why (her pysch MD and I provided letters) that her depression gets worse. Her depression is worse and is causing her to eat uncontrollably which has caused her to gain weight. She has been on Wellbutrin for some time and I wonder if she needs to try other pharmaceuticals. She pays out of pocket to see a psychiatrist twice a year to get her Xanax prescription. I discussed some various options and she agreed to be referred [facility] to get a second opinion regarding her uncontrolled depression. I also offered to refer to [name] for health coaching but she declined. She does attend support groups and recently went to the first overeaters anonymous meeting that when she got home she had not healthy food in her home and continues to eat healthy. She denies suicidality but has admitted past that sometimes she wonders why she continues to wake up. She stated that her dog is now 69 years old and is declining healthwise and I expressed my concern about her ability to cope when he dies. She states that since he is declining slowly, she is coming to terms with that and she thinks that she will do better once he dies bc she will then be free to go exercise and do other activities.  PLAN : cont wellbutrin Refer to [facility]

## 2016-08-28 NOTE — Assessment & Plan Note (Signed)
This problem is chronic and stable. Years ago she was told when she went to give blood that she had hepatitis but it was not infectious. She does not know any other details. She had hepatitis C antibody negative recently. I am going to check hepatitis B surface antibody and antigen.  PLAn : Hep B S Ag and Ab

## 2016-08-28 NOTE — Assessment & Plan Note (Signed)
This problem is chronic and uncontrolled. She is on HCTZ 25 and amlodipine 10 mg. She has a blood pressure cuff at home but her arm is too big and she has to use her other hand to hold it around her arm. She states her home blood pressure is 120/70 something but I wonder about that accuracy. Her blood pressure has been consistently elevated in the clinic. She has been on an ACE inhibitor in the past but had a cough. Today I am adding irbesartan 150 mg. I chose this ARB because it is part of tribenzor and it would be best to get her on a single pill triple therapy. However, it doesn't come in the dosages I need so I am going to start them separately and then combine them if she tolerates.  PLAN : HCTZ 25 Amlodipine 10 Irbesartan 150

## 2016-08-28 NOTE — Patient Instructions (Signed)
1. Please schedule an appointment in one month for hypertension follow-up 2 I started a new medication for high blood pressure called omlasartan 3. Please bring you blood pressure cuff at your next appointment.  4. I referred [facility] to [facility] 5. I will you your blood test results

## 2016-08-28 NOTE — Assessment & Plan Note (Signed)
This problem is chronic and stable. We reviewed her creatinine trend and I explained that her creatinine had not worsened over the past 10 years which was reassuring. I explained that if her creatinine started to trend up, this would be concerning. She took 1 ibuprofen recently for knee osteoarthritis and we discussed that an occasional ibuprofen would be okay but not daily.  PLAN : Check creatinine today

## 2016-08-28 NOTE — Assessment & Plan Note (Signed)
This problem is new. Her LFTs are elevated in June 2017. She had gallbladder issues at this time and I am hoping on recheck today that they will have normalized. However, in review of her abdominal imaging an MRI that was done during that time showed hepatic steatosis. She is morbidly obese, knows that, but her depression is preventing her from controlling her weight.  PLAN : CMP, platelets today

## 2016-08-28 NOTE — Progress Notes (Signed)
   Subjective:    Patient ID: Barrie Dunkerelestine T Baack, female    DOB: 27-Jul-1945, 71 y.o.   MRN: [id number]  HPI  Keiarah T Sharol HarnessSimmons is here for HTN F/U. Please see the A&P for the status of the pt's chronic medical problems.  ROS : per ROS section and in problem oriented charting. All other systems are negative.  PMHx, Soc hx, and / or Fam hx : She was turned down for the 2 bedroom apartment and has contacted legal aid to take on her case. Her dog Ricki RodriguezCutie is now 69 years old and is declining healthwise.   Review of Systems  Constitutional: Negative for appetite change and unexpected weight change.  Endocrine: Positive for polydipsia.  Musculoskeletal: Positive for arthralgias.  Psychiatric/Behavioral: Positive for dysphoric mood.       Objective:   Physical Exam  Constitutional: She appears well-developed and well-nourished. No distress.  HENT:  Head: Normocephalic and atraumatic.  Right Ear: External ear normal.  Left Ear: External ear normal.  Nose: Nose normal.  Eyes: Conjunctivae and EOM are normal. Right eye exhibits no discharge. Left eye exhibits no discharge. No scleral icterus.  Skin: She is not diaphoretic.  Psychiatric: She has a normal mood and affect. Her behavior is normal. Judgment and thought content normal.          Assessment & Plan:

## 2016-08-29 LAB — CBC
HEMATOCRIT: 41.2 % (ref 34.0–46.6)
Hemoglobin: 14.1 g/dL (ref 11.1–15.9)
MCH: 26.4 pg — ABNORMAL LOW (ref 26.6–33.0)
MCHC: 34.2 g/dL (ref 31.5–35.7)
MCV: 77 fL — ABNORMAL LOW (ref 79–97)
Platelets: 230 10*3/uL (ref 150–379)
RBC: 5.34 x10E6/uL — ABNORMAL HIGH (ref 3.77–5.28)
RDW: 15.6 % — ABNORMAL HIGH (ref 12.3–15.4)
WBC: 5.9 10*3/uL (ref 3.4–10.8)

## 2016-08-29 LAB — BMP8+ANION GAP
Anion Gap: 19 mmol/L — ABNORMAL HIGH (ref 10.0–18.0)
BUN/Creatinine Ratio: 13 (ref 12–28)
BUN: 14 mg/dL (ref 8–27)
CHLORIDE: 98 mmol/L (ref 96–106)
CO2: 23 mmol/L (ref 20–29)
Calcium: 9.2 mg/dL (ref 8.7–10.3)
Creatinine, Ser: 1.06 mg/dL — ABNORMAL HIGH (ref 0.57–1.00)
GFR calc Af Amer: 62 mL/min/{1.73_m2} (ref >59)
GFR calc non Af Amer: 54 mL/min/{1.73_m2} — ABNORMAL LOW (ref >59)
GLUCOSE: 106 mg/dL — AB (ref 65–99)
Potassium: 4 mmol/L (ref 3.5–5.2)
SODIUM: 140 mmol/L (ref 134–144)

## 2016-08-29 LAB — HEPATITIS B SURFACE ANTIGEN: HEP B S AG: NEGATIVE

## 2016-08-29 LAB — TSH: TSH: 2.57 u[IU]/mL (ref 0.450–4.500)

## 2016-08-29 LAB — HEPATITIS B SURFACE ANTIBODY,QUALITATIVE: Hep B Surface Ab, Qual: REACTIVE

## 2016-08-30 ENCOUNTER — Other Ambulatory Visit: Payer: Self-pay | Admitting: Internal Medicine

## 2016-09-01 LAB — SPECIMEN STATUS REPORT

## 2016-09-01 LAB — HEPATIC FUNCTION PANEL
ALT: 24 IU/L (ref 0–32)
AST: 28 IU/L (ref 0–40)
Albumin: 4.6 g/dL (ref 3.6–4.8)
Alkaline Phosphatase: 88 IU/L (ref 39–117)
BILIRUBIN, DIRECT: 0.13 mg/dL (ref 0.00–0.40)
Bilirubin Total: 0.3 mg/dL (ref 0.0–1.2)
TOTAL PROTEIN: 7.5 g/dL (ref 6.0–8.5)

## 2016-09-05 ENCOUNTER — Encounter: Payer: Self-pay | Admitting: Internal Medicine

## 2016-09-09 ENCOUNTER — Telehealth: Payer: Self-pay

## 2016-09-09 NOTE — Telephone Encounter (Signed)
Questions about med. Please call pt back.  

## 2016-09-25 ENCOUNTER — Ambulatory Visit (INDEPENDENT_AMBULATORY_CARE_PROVIDER_SITE_OTHER): Payer: Medicare HMO | Admitting: Internal Medicine

## 2016-09-25 VITALS — BP 124/55 | HR 81 | Wt 371.0 lb

## 2016-09-25 DIAGNOSIS — F329 Major depressive disorder, single episode, unspecified: Secondary | ICD-10-CM

## 2016-09-25 DIAGNOSIS — Z8619 Personal history of other infectious and parasitic diseases: Secondary | ICD-10-CM

## 2016-09-25 DIAGNOSIS — F341 Dysthymic disorder: Secondary | ICD-10-CM | POA: Diagnosis not present

## 2016-09-25 DIAGNOSIS — I10 Essential (primary) hypertension: Secondary | ICD-10-CM | POA: Diagnosis not present

## 2016-09-25 DIAGNOSIS — N3941 Urge incontinence: Secondary | ICD-10-CM

## 2016-09-25 DIAGNOSIS — Z0000 Encounter for general adult medical examination without abnormal findings: Secondary | ICD-10-CM

## 2016-09-25 MED ORDER — OLMESARTAN MEDOXOMIL 20 MG PO TABS: 20.0000 mg | ORAL_TABLET | Freq: Every day | ORAL | 2 refills | Status: DC

## 2016-09-25 NOTE — Progress Notes (Signed)
   Subjective:    Patient ID: [name], female    DOB: 08-Oct-1945, 71 y.o.   MRN: [id number]  HPI  [name] is here for HTN F/U. Please see the A&P for the status of the pt's chronic medical problems.  ROS : per ROS section and in problem oriented charting. All other systems are negative.  PMHx, Soc hx, and / or Fam hx : Has an appointment with [facility] in September  Review of Systems + charley horses for the past month, hand numbness which is not new, vague left lower back/side pain which is worse with moving and like a catch, occasional fall. Negative for lightheadedness or dizziness.     Objective:   Physical Exam  Constitutional: She appears well-developed and well-nourished. No distress.  HENT:  Head: Normocephalic and atraumatic.  Right Ear: External ear normal.  Left Ear: External ear normal.  Nose: Nose normal.  Eyes: Conjunctivae and EOM are normal.  Skin: She is not diaphoretic.  Psychiatric: She has a normal mood and affect. Her behavior is normal. Judgment and thought content normal.          Assessment & Plan:

## 2016-09-25 NOTE — Assessment & Plan Note (Signed)
This problem is chronic and improved. Her medications were Norvasc 10 and HCTZ 25. At her last appointment, I added omlesartan 20. She has no side effects to this new medication. Her blood pressure is not at goal. She requested I refill it for a 90 day supply at her mail order pharmacy.  PLAN:  Cont current meds   BP Readings from Last 3 Encounters:  09/25/16 (!) 124/55  08/28/16 (!) 144/66  06/12/16 (!) 149/98

## 2016-09-25 NOTE — Assessment & Plan Note (Signed)
I have updated her medication list to include over-the-counter Fish oil with omega-3 and vitamin D3.

## 2016-09-25 NOTE — Assessment & Plan Note (Signed)
We reviewed her lab test results and her immunity to hepatitis B.

## 2016-09-25 NOTE — Assessment & Plan Note (Signed)
This problem is stable. She has an appointment in September with [facility]. This was the earliest they could see her.  PLAN:  Cont current meds F/U after she sees [facility]

## 2016-09-25 NOTE — Patient Instructions (Signed)
1. Great job on your blood pressure 2.

## 2016-09-25 NOTE — Assessment & Plan Note (Signed)
She is no longer getting any benefit to Mirabegron. We discussed that she can stop it. I offered a referral to gynecology to talk about pessaries or surgery and she declined at this time. I encouraged her to let me know if she ever wants to be referred.  PLAN : D/C Mirabegron

## 2016-09-26 LAB — BMP8+ANION GAP
ANION GAP: 18 mmol/L (ref 10.0–18.0)
BUN/Creatinine Ratio: 15 (ref 12–28)
BUN: 20 mg/dL (ref 8–27)
CHLORIDE: 98 mmol/L (ref 96–106)
CO2: 24 mmol/L (ref 20–29)
Calcium: 9.6 mg/dL (ref 8.7–10.3)
Creatinine, Ser: 1.33 mg/dL — ABNORMAL HIGH (ref 0.57–1.00)
GFR calc Af Amer: 47 mL/min/{1.73_m2} — ABNORMAL LOW (ref >59)
GFR calc non Af Amer: 41 mL/min/{1.73_m2} — ABNORMAL LOW (ref >59)
GLUCOSE: 95 mg/dL (ref 65–99)
POTASSIUM: 4.6 mmol/L (ref 3.5–5.2)
Sodium: 140 mmol/L (ref 134–144)

## 2016-09-29 ENCOUNTER — Other Ambulatory Visit: Payer: Self-pay | Admitting: Internal Medicine

## 2016-09-29 ENCOUNTER — Encounter: Payer: Self-pay | Admitting: Internal Medicine

## 2016-09-29 DIAGNOSIS — N182 Chronic kidney disease, stage 2 (mild): Secondary | ICD-10-CM

## 2016-09-30 ENCOUNTER — Ambulatory Visit (INDEPENDENT_AMBULATORY_CARE_PROVIDER_SITE_OTHER): Payer: Medicare HMO | Admitting: Internal Medicine

## 2016-09-30 ENCOUNTER — Encounter: Payer: Self-pay | Admitting: Internal Medicine

## 2016-09-30 ENCOUNTER — Telehealth: Payer: Self-pay | Admitting: *Deleted

## 2016-09-30 VITALS — BP 111/58 | HR 74 | Temp 98.3°F | Ht 67.5 in | Wt 371.0 lb

## 2016-09-30 DIAGNOSIS — R52 Pain, unspecified: Secondary | ICD-10-CM | POA: Diagnosis not present

## 2016-09-30 DIAGNOSIS — T148XXA Other injury of unspecified body region, initial encounter: Secondary | ICD-10-CM

## 2016-09-30 MED ORDER — CYCLOBENZAPRINE HCL 5 MG PO TABS: 5.0000 mg | ORAL_TABLET | Freq: Three times a day (TID) | ORAL | 0 refills | Status: DC | PRN

## 2016-09-30 MED ORDER — TRAMADOL HCL 50 MG PO TABS: 50.0000 mg | ORAL_TABLET | Freq: Four times a day (QID) | ORAL | 0 refills | Status: DC | PRN

## 2016-09-30 NOTE — Patient Instructions (Signed)
[name],  It was a pleasure meeting you today. Please start taking Flexeril and tramadol to help with your muscle strain. Please return to the clinic if your symptoms do not improve or worsen.

## 2016-09-30 NOTE — Telephone Encounter (Addendum)
Prescriptions for Cyclobenzaprine 5 mg tablets .  Take 1 tablet by mouth 3 times daily as needed for muscle spasms.  Dispense # 30 with no refills.  Tramadol 50 mg tablets Dispense # 20.  Take 1 tablet by mouth every 6 hours as needed was called [facility] per order of Dr. Geralyn CorwinJessica Hoffman. [name], RN 09/30/2016 11:55 AM.

## 2016-09-30 NOTE — Progress Notes (Signed)
   CC: right sided pain  HPI:  [name] is a 71 y.o. female with history noted below that presents to the acute care clinic for several week history of worsening right sided pain worse with movement specifically turning from side to side.  She first noticed the pain when getting out of bed. Patient has not tried anything for pain.  She denies radiating pain, weakness, or falls/trauma to the area.   Past Medical History:  Diagnosis Date  . Arthritis    osteoarthritis , kness.  . Chronic kidney disease    Renal insuffucinecy, cr-1.33 in 10/2005.  [name]. Depression    followed by Dr. Evelene CroonKaur  . Diverticulosis of colon   . Fibrocystic breast disease   . History of hepatitis B   . History of pulmonary embolism 01/2002  . Hyperlipidemia   . Hypertension   . Left shoulder pain    2/2 fall  . Neck nodule    not erythematous, movable like cyst located at the base of posterior neck on the left side(not painful), will need follow up for   . Obesity   . Obstructive sleep apnea    refuses CPAP.  [name]. Pulmonary nodule    Stable on repeat imaging. LLL nodule 5 mm    Review of Systems:  Review of Systems  Constitutional: Negative for chills and fever.  Respiratory: Negative for sputum production.   Cardiovascular: Negative for chest pain.  Gastrointestinal: Negative for abdominal pain, nausea and vomiting.  Genitourinary: Negative for flank pain.  Musculoskeletal: Negative for falls.     Physical Exam:  Vitals:   09/30/16 1110  BP: (!) 111/58  Pulse: 74  Temp: 98.3 F (36.8 C)  TempSrc: Oral  SpO2: 96%  Weight: (!) 371 lb (168.3 kg)  Height: 5' 7.5" (1.715 m)   Physical Exam  Cardiovascular: Normal rate, regular rhythm and normal heart sounds.   Pulmonary/Chest: Effort normal and breath sounds normal. No respiratory distress. She has no wheezes. She has no rales.  Abdominal: Soft. She exhibits no distension and no mass. There is no tenderness. There is no rebound and no  guarding.  Musculoskeletal:  Tenderness and tightness to palpation to right side  Neurological: Gait normal.  5/5 motor strength  Skin: Skin is warm and dry.    Assessment & Plan:   See encounters tab for problem based medical decision making.    Patient discussed with Dr. Oswaldo DoneVincent

## 2016-10-02 DIAGNOSIS — T148XXA Other injury of unspecified body region, initial encounter: Principal | ICD-10-CM

## 2016-10-02 NOTE — Assessment & Plan Note (Signed)
Assessment:  Right sided muscle strain Patient presents with worsening side pain, worse with movement.  I suspect patient had a right sided muscle strain, most likely internal oblique muscle strain.  Will try a trial of flexeril and pain medication.  Patient's GFR is 47.  Will avoid NSAIDs.  Will try a short course of tramadol.  Plan -flexeril - tramadol - heating pads

## 2016-10-03 NOTE — Progress Notes (Signed)
Internal Medicine Clinic Attending  Case discussed with Dr. Hoffman at the time of the visit.  We reviewed the resident's history and exam and pertinent patient test results.  I agree with the assessment, diagnosis, and plan of care documented in the resident's note.  

## 2016-10-06 ENCOUNTER — Other Ambulatory Visit: Payer: Self-pay | Admitting: Internal Medicine

## 2016-10-06 DIAGNOSIS — I10 Essential (primary) hypertension: Secondary | ICD-10-CM

## 2016-10-13 ENCOUNTER — Other Ambulatory Visit: Payer: Self-pay | Admitting: *Deleted

## 2016-10-13 ENCOUNTER — Ambulatory Visit: Payer: Medicare HMO | Admitting: Pharmacist

## 2016-10-13 ENCOUNTER — Telehealth: Payer: Self-pay | Admitting: *Deleted

## 2016-10-13 ENCOUNTER — Telehealth: Payer: Self-pay | Admitting: Dietician

## 2016-10-13 DIAGNOSIS — E6609 Other obesity due to excess calories: Secondary | ICD-10-CM

## 2016-10-13 DIAGNOSIS — Z79899 Other long term (current) drug therapy: Secondary | ICD-10-CM

## 2016-10-13 NOTE — Telephone Encounter (Addendum)
Has been out of xarelto x 2 nights, HCTZ x 3 days. [facility] was sent to [facility]7 but patient has not received it yet. Requesting few samples til refilled. She's coming this morning. I just sent refill request to MD for xarelto so she may need 30 day supply, thanks!

## 2016-10-13 NOTE — Telephone Encounter (Signed)
She got yr supply [facility]to in Jan at [facility] Do I need to fill either med aet local pharmacy for 30 day supply?

## 2016-10-13 NOTE — Telephone Encounter (Signed)
I sent yr supply in Jan to [facility]

## 2016-10-13 NOTE — Telephone Encounter (Signed)
Patient called to request an appointment for MNT per Dr. Donnelly StagerButcher's suggestion. She scheduled one for this Friday. Request referral.

## 2016-10-13 NOTE — Progress Notes (Signed)
Patient requested a few days supply of rivaroxaban and hydrochlorothiazide.   Medication Samples have been provided to the patient.  Drug: rivaroxaban (Xarelto) Strength: 20 mg Qty: 7 LOT: [id number] Exp.Date: March 2020 Dosing instructions: take 1 tablet by mouth daily  Drug: HCTZ Strength: 25 mg Qty: 4 LOT: [id number] Exp.Date: 12/2017 Dosing instructions: take 1 tablet by mouth daily  Marzetta BoardJennifer Tehillah Cipriani 10:03 AM 10/13/2016

## 2016-10-15 ENCOUNTER — Ambulatory Visit (INDEPENDENT_AMBULATORY_CARE_PROVIDER_SITE_OTHER): Payer: Medicare HMO | Admitting: Psychiatry

## 2016-10-15 ENCOUNTER — Encounter (HOSPITAL_COMMUNITY): Payer: Self-pay | Admitting: Psychiatry

## 2016-10-15 DIAGNOSIS — Z87891 Personal history of nicotine dependence: Secondary | ICD-10-CM | POA: Diagnosis not present

## 2016-10-15 DIAGNOSIS — F330 Major depressive disorder, recurrent, mild: Secondary | ICD-10-CM

## 2016-10-15 DIAGNOSIS — Z79899 Other long term (current) drug therapy: Secondary | ICD-10-CM

## 2016-10-15 DIAGNOSIS — F339 Major depressive disorder, recurrent, unspecified: Secondary | ICD-10-CM | POA: Diagnosis not present
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
## 2016-10-17 ENCOUNTER — Ambulatory Visit (INDEPENDENT_AMBULATORY_CARE_PROVIDER_SITE_OTHER): Payer: Medicare HMO | Admitting: Dietician

## 2016-10-17 ENCOUNTER — Encounter: Payer: Self-pay | Admitting: Dietician

## 2016-10-17 DIAGNOSIS — Z724 Inappropriate diet and eating habits: Secondary | ICD-10-CM

## 2016-10-17 DIAGNOSIS — N182 Chronic kidney disease, stage 2 (mild): Secondary | ICD-10-CM | POA: Diagnosis not present

## 2016-10-17 DIAGNOSIS — Z713 Dietary counseling and surveillance: Secondary | ICD-10-CM | POA: Diagnosis not present

## 2016-10-17 DIAGNOSIS — F329 Major depressive disorder, single episode, unspecified: Secondary | ICD-10-CM

## 2016-10-17 NOTE — Patient Instructions (Signed)
I will wait to hear from you after your dental surgery and you start the Optifast program.  I am happy to talk to the Optifast program as needed. Please bring a Optifast shake container or how many calories they want you to have a day along with the shakes.   Lupita Leash [phone]

## 2016-10-17 NOTE — Progress Notes (Signed)
  Medical Nutrition Therapy:  Appt start time: 1305 end time:  1410. Visit # 1  Assessment:  Primary concerns today: weight loss/overall health.  Ms. Uhlmann has lot's of questions today. She had been to an [employer] today. She asks about emotional eating and general nutrition. She is animated and engaged. She reports problems with depression and emotional binge eating. She regularly attends depression support groups and has had training as a peer support leader.  When she is depressed she tends to lay in bed/sleep for long periods of time. Is computer literate.  Preferred Learning Style: No preference indicated  Learning Readiness: Contemplating  ANTHROPOMETRICS: weight-371.4#, height-67.5#, BMI-57.31 SLEEP:not assessed today Depression: appeared well controlled today MEDICATIONS: noted DIETARY INTAKE: Pork rinds, knorr's rice mixes with meat and frozen vegetables, cake, candy when eating emotionally Beverages: diet soda, water  Usual physical activity: not discussed today  Estimated daily energy needs to be 350# by 04/2017: 2514 calories 125 -140g protein 200-250 g protein 70g fat Progress Towards Goal(s):  In progress.   Nutritional Diagnosis:  NI-1.5 Excessive energy intake As related to emotional eating, depression causing competing values and decreased activity.  As evidenced by her report.    Intervention:  Nutrition education and counseling about difficulties and need for support to make lifestyle changes. Reviewed labs per her request. Coordinate with optifast to modify diet for renal needs.  Coordination of care: with optifast as needed  Teaching Method Utilized:Visual,Auditory,Hands on Handouts given during visit include: AVS, emailed her emotional eating slides Barriers to learning/adherence to lifestyle change: competing values and depresssion Demonstrated degree of understanding via:  Teach Back   Monitoring/Evaluation:  Dietary intake, exercise, and body weight  prn. Penne Rosenstock, Lupita Leash, RD 10/17/2016 4:06 PM.

## 2016-10-27 DIAGNOSIS — Z6841 Body Mass Index (BMI) 40.0 and over, adult: Secondary | ICD-10-CM | POA: Diagnosis not present

## 2016-10-27 DIAGNOSIS — E669 Obesity, unspecified: Secondary | ICD-10-CM | POA: Diagnosis not present

## 2016-10-27 DIAGNOSIS — R635 Abnormal weight gain: Secondary | ICD-10-CM | POA: Diagnosis not present

## 2016-11-04 DIAGNOSIS — F322 Major depressive disorder, single episode, severe without psychotic features: Secondary | ICD-10-CM | POA: Diagnosis not present

## 2016-11-04 DIAGNOSIS — I10 Essential (primary) hypertension: Secondary | ICD-10-CM | POA: Diagnosis not present

## 2016-11-04 DIAGNOSIS — Z6841 Body Mass Index (BMI) 40.0 and over, adult: Secondary | ICD-10-CM | POA: Diagnosis not present

## 2016-11-04 DIAGNOSIS — G4733 Obstructive sleep apnea (adult) (pediatric): Secondary | ICD-10-CM | POA: Diagnosis not present

## 2016-11-04 DIAGNOSIS — N3941 Urge incontinence: Secondary | ICD-10-CM | POA: Diagnosis not present

## 2016-11-04 DIAGNOSIS — E785 Hyperlipidemia, unspecified: Secondary | ICD-10-CM | POA: Diagnosis not present

## 2016-11-14 ENCOUNTER — Ambulatory Visit (HOSPITAL_COMMUNITY): Payer: Medicare HMO | Admitting: Psychiatry

## 2016-11-20 ENCOUNTER — Telehealth: Payer: Self-pay | Admitting: Dietician

## 2016-11-20 ENCOUNTER — Encounter: Payer: Self-pay | Admitting: Dietician

## 2016-11-20 NOTE — Telephone Encounter (Signed)
Received this email from [name]:  To: [name], RD,CDE, LD, Diabetes Program Coordinator at [facility]  From:  [name] Re: Information on my weight management   Aug 28, 2016 - Highest weight this year was 377 lbs. (Situations in my life started me emotional eating which contributed to my gaining this weight)   Oct 17, 2016 - Went to the [facility] "Medical Weight Management" Seminar Oct 17, 2016 - [facility] with you at [facility]. Oct 27, 2016 - Had my "Initial Evaluation Results", at [facility]:    Actual Resting Metabolic Rate - 8,887 calories used while at rest   Body Weight - 365 lbs.  (This weight is questionable)   % Body Fat - 56 % Nov 04, 2016 - Officially joined [employer] [facility] Program (By Therapist, nutritional, 6 month program) - Body Weight when I joined the Program was 373 lbs.  Not using the Optifast supplements, they told me the [place] would work just as well and would be cheaper. I'm taking 2 Protein Shakes along with 3 snack meals every 3 hours. Nov 19, 2016 - Current weight today is 363.3 lbs. . Saw that I missed that "Living Well: Managing Emotional Eating", 4 week class again.  Nov 28, 2016, Friday - Will be [facility] for Lab work and my Flu shot at 9:30.  LET ME KNOW IF YOU WANT ME TO VISIT WITH YOU WHILE I'M THERE.   [name], patient of Dr. Chinita Pester [phone]

## 2016-11-27 DIAGNOSIS — Z6841 Body Mass Index (BMI) 40.0 and over, adult: Secondary | ICD-10-CM | POA: Diagnosis not present

## 2016-11-27 DIAGNOSIS — Z713 Dietary counseling and surveillance: Secondary | ICD-10-CM | POA: Diagnosis not present

## 2016-11-28 ENCOUNTER — Other Ambulatory Visit (INDEPENDENT_AMBULATORY_CARE_PROVIDER_SITE_OTHER): Payer: Medicare HMO

## 2016-11-28 ENCOUNTER — Ambulatory Visit (INDEPENDENT_AMBULATORY_CARE_PROVIDER_SITE_OTHER): Payer: Medicare HMO | Admitting: *Deleted

## 2016-11-28 DIAGNOSIS — Z23 Encounter for immunization: Secondary | ICD-10-CM | POA: Diagnosis not present

## 2016-11-28 DIAGNOSIS — N182 Chronic kidney disease, stage 2 (mild): Secondary | ICD-10-CM | POA: Diagnosis not present

## 2016-11-29 LAB — BMP8+ANION GAP
Anion Gap: 16 mmol/L (ref 10.0–18.0)
BUN / CREAT RATIO: 18 (ref 12–28)
BUN: 22 mg/dL (ref 8–27)
CALCIUM: 9.2 mg/dL (ref 8.7–10.3)
CO2: 22 mmol/L (ref 20–29)
CREATININE: 1.2 mg/dL — AB (ref 0.57–1.00)
Chloride: 100 mmol/L (ref 96–106)
GFR calc Af Amer: 53 mL/min/{1.73_m2} — ABNORMAL LOW (ref >59)
GFR calc non Af Amer: 46 mL/min/{1.73_m2} — ABNORMAL LOW (ref >59)
Glucose: 117 mg/dL — ABNORMAL HIGH (ref 65–99)
Potassium: 4.2 mmol/L (ref 3.5–5.2)
Sodium: 138 mmol/L (ref 134–144)

## 2016-12-01 ENCOUNTER — Encounter: Payer: Self-pay | Admitting: Internal Medicine

## 2016-12-02 ENCOUNTER — Encounter (HOSPITAL_COMMUNITY): Payer: Self-pay | Admitting: Emergency Medicine

## 2016-12-02 ENCOUNTER — Emergency Department (HOSPITAL_COMMUNITY)
Admission: EM | Admit: 2016-12-02 | Discharge: 2016-12-03 | Disposition: A | Discharge status: Other Acute Inpatient Hospital | Payer: Medicare HMO | Attending: Emergency Medicine | Admitting: Emergency Medicine

## 2016-12-02 DIAGNOSIS — Z7901 Long term (current) use of anticoagulants: Secondary | ICD-10-CM | POA: Insufficient documentation

## 2016-12-02 DIAGNOSIS — R45851 Suicidal ideations: Secondary | ICD-10-CM | POA: Insufficient documentation

## 2016-12-02 DIAGNOSIS — N182 Chronic kidney disease, stage 2 (mild): Secondary | ICD-10-CM | POA: Insufficient documentation

## 2016-12-02 DIAGNOSIS — Z01818 Encounter for other preprocedural examination: Secondary | ICD-10-CM

## 2016-12-02 DIAGNOSIS — F332 Major depressive disorder, recurrent severe without psychotic features: Secondary | ICD-10-CM

## 2016-12-02 DIAGNOSIS — Z87891 Personal history of nicotine dependence: Secondary | ICD-10-CM | POA: Insufficient documentation

## 2016-12-02 DIAGNOSIS — R079 Chest pain, unspecified: Secondary | ICD-10-CM | POA: Diagnosis not present

## 2016-12-02 DIAGNOSIS — Z79899 Other long term (current) drug therapy: Secondary | ICD-10-CM | POA: Diagnosis not present

## 2016-12-02 DIAGNOSIS — Z046 Encounter for general psychiatric examination, requested by authority: Secondary | ICD-10-CM | POA: Diagnosis not present

## 2016-12-02 DIAGNOSIS — R9431 Abnormal electrocardiogram [ECG] [EKG]: Secondary | ICD-10-CM | POA: Diagnosis not present

## 2016-12-02 DIAGNOSIS — G4700 Insomnia, unspecified: Secondary | ICD-10-CM | POA: Diagnosis not present

## 2016-12-02 DIAGNOSIS — I129 Hypertensive chronic kidney disease with stage 1 through stage 4 chronic kidney disease, or unspecified chronic kidney disease: Secondary | ICD-10-CM | POA: Insufficient documentation

## 2016-12-02 DIAGNOSIS — F329 Major depressive disorder, single episode, unspecified: Secondary | ICD-10-CM | POA: Diagnosis present

## 2016-12-02 LAB — RAPID URINE DRUG SCREEN, HOSP PERFORMED
AMPHETAMINES: NOT DETECTED
Barbiturates: NOT DETECTED
Benzodiazepines: POSITIVE — AB
Cocaine: NOT DETECTED
OPIATES: NOT DETECTED
Tetrahydrocannabinol: NOT DETECTED

## 2016-12-02 MED ORDER — ALPRAZOLAM 1 MG PO TABS
2.0000 mg | ORAL_TABLET | ORAL | Status: AC
Start: 2016-12-02 — End: 2016-12-02
  Administered 2016-12-02: 2 mg via ORAL
  Filled 2016-12-02: qty 2

## 2016-12-02 MED ORDER — BUPROPION HCL 100 MG PO TABS
200.0000 mg | ORAL_TABLET | Freq: Every day | ORAL | Status: DC
  Administered 2016-12-03: 200 mg via ORAL
  Filled 2016-12-02: qty 2

## 2016-12-02 MED ORDER — TRAMADOL HCL 50 MG PO TABS: 50.0000 mg | ORAL_TABLET | Freq: Four times a day (QID) | ORAL | Status: DC | PRN

## 2016-12-02 MED ORDER — AMLODIPINE BESYLATE 5 MG PO TABS
10.0000 mg | ORAL_TABLET | Freq: Every day | ORAL | Status: DC
  Administered 2016-12-02 – 2016-12-03 (×2): 10 mg via ORAL
  Filled 2016-12-02 (×2): qty 2

## 2016-12-02 MED ORDER — RIVAROXABAN 20 MG PO TABS
20.0000 mg | ORAL_TABLET | Freq: Every day | ORAL | Status: DC
  Administered 2016-12-02: 20 mg via ORAL
  Filled 2016-12-02: qty 1

## 2016-12-02 MED ORDER — ADULT MULTIVITAMIN W/MINERALS CH
1.0000 | ORAL_TABLET | Freq: Every day | ORAL | Status: DC
  Administered 2016-12-03: 1 via ORAL
  Filled 2016-12-02: qty 1

## 2016-12-02 MED ORDER — ALPRAZOLAM 1 MG PO TABS: 1.0000 mg | ORAL_TABLET | Freq: Three times a day (TID) | ORAL | Status: DC | PRN

## 2016-12-02 MED ORDER — POTASSIUM CHLORIDE CRYS ER 10 MEQ PO TBCR
10.0000 meq | EXTENDED_RELEASE_TABLET | Freq: Two times a day (BID) | ORAL | Status: DC
  Administered 2016-12-02 – 2016-12-03 (×2): 10 meq via ORAL
  Filled 2016-12-02 (×2): qty 1

## 2016-12-02 MED ORDER — PRAVASTATIN SODIUM 40 MG PO TABS
40.0000 mg | ORAL_TABLET | Freq: Every day | ORAL | Status: DC
  Administered 2016-12-02: 40 mg via ORAL
  Filled 2016-12-02: qty 1

## 2016-12-02 MED ORDER — HYDROCHLOROTHIAZIDE 25 MG PO TABS
25.0000 mg | ORAL_TABLET | Freq: Every day | ORAL | Status: DC
  Administered 2016-12-03: 25 mg via ORAL
  Filled 2016-12-02: qty 1

## 2016-12-02 MED ORDER — ALPRAZOLAM 1 MG PO TABS
1.0000 mg | ORAL_TABLET | Freq: Every evening | ORAL | Status: DC | PRN
  Filled 2016-12-02: qty 1

## 2016-12-02 MED ORDER — BUPROPION HCL 100 MG PO TABS
100.0000 mg | ORAL_TABLET | Freq: Every day | ORAL | Status: DC
  Administered 2016-12-02: 100 mg via ORAL
  Filled 2016-12-02: qty 1

## 2016-12-02 MED ORDER — IRBESARTAN 150 MG PO TABS
150.0000 mg | ORAL_TABLET | Freq: Every day | ORAL | Status: DC
  Administered 2016-12-03: 150 mg via ORAL
  Filled 2016-12-02: qty 1
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
## 2016-12-02 NOTE — ED Notes (Signed)
Bed: GN56 Expected date:  Expected time:  Means of arrival:  Comments: Pt coming from [facility]

## 2016-12-02 NOTE — BHH Counselor (Addendum)
Clinician spoke to [place], [place] and noted the pt has not seen by EDP. Clinician asked RN to contact her once EDP has seen the pt. Clinician provided RN with contact information.    Redmond Pulling, MS, [facility], [facility] Triage Specialist [phone]

## 2016-12-02 NOTE — ED Provider Notes (Signed)
[facility]-EMERGENCY DEPT Provider Note   CSN: [id number] Arrival date & time: 12/02/16  2111     History   Chief Complaint Chief Complaint  Patient presents with  . IVC    HPI [name] is a 71 y.o. female.  HPI Pt brought to ER by [employer] after IVC paperwork was taken out by mobile crisis team. Pt reports she was having a bad evening and called mobile crisis. Reports possibly stated she was suicidal. No suicidal ideation at this time. Would like to go home at this time. No hallucinations   Past Medical History:  Diagnosis Date  . Arthritis    osteoarthritis , kness.  . Chronic kidney disease    Renal insuffucinecy, cr-1.33 in 10/2005.  [name] Depression    followed by Dr. Evelene Croon  . Diverticulosis of colon   . Fibrocystic breast disease   . History of hepatitis B   . History of pulmonary embolism 01/2002  . Hyperlipidemia   . Hypertension   . Left shoulder pain    2/2 fall  . Neck nodule    not erythematous, movable like cyst located at the base of posterior neck on the left side(not painful), will need follow up for   . Obesity   . Obstructive sleep apnea    refuses CPAP.  [name] Pulmonary nodule    Stable on repeat imaging. LLL nodule 5 mm    Patient Active Problem List   Diagnosis Date Noted  . Muscle strain 10/02/2016  . Hepatic steatosis 08/27/2016  . Urge incontinence 04/05/2015  . Chronic kidney disease (CKD) stage G2/A1, mildly decreased glomerular filtration rate (GFR) between 60-89 mL/min/1.73 square meter and albuminuria creatinine ratio less than 30 mg/g 07/28/2014  . Osteopenia 03/08/2014  . Pulmonary hypertension (HCC) 11/18/2013  . Preventative health care 10/21/2010  . GERD 10/26/2006  . Hyperlipidemia 03/07/2006  . Severe obesity (BMI >= 40) (HCC) 01/19/2006  . Major depression, chronic 01/19/2006  . OSA (obstructive sleep apnea) 01/19/2006  . HTN (hypertension) 01/19/2006  . OA (osteoarthritis) of knee 01/19/2006  . History of hepatitis B  01/19/2006  . PULMONARY EMBOLISM, HX OF 01/19/2006    Past Surgical History:  Procedure Laterality Date  . CHOLECYSTECTOMY N/A 08/09/2015   Procedure: LAPAROSCOPIC CHOLECYSTECTOMY WITH INTRAOPERATIVE CHOLANGIOGRAM;  Surgeon: Harriette Bouillon, MD;  Location: [facility] OR;  Service: General;  Laterality: N/A;  . GYN surgery     s/p excison of vulvar cyst 10/18/04 by dr. Ilene QuaChristell Constant    OB History    No data available       Home Medications    Prior to Admission medications   Medication Sig Start Date End Date Taking? Authorizing Provider  ALPRAZolam [name]) 1 MG tablet Take 1-2 mg by mouth 4 (four) times daily as needed for anxiety or sleep. Pt takes 1 tablet up to 3 times a day and 2 tablets at bedtime   Yes [provider]  amLODipine (NORVASC) 10 MG tablet Take 1 tablet (10 mg total) by mouth daily. 10/07/16  Yes [name], MD  buPROPion (WELLBUTRIN) 100 MG tablet Take 100-200 mg by mouth 2 (two) times daily. Takes 200 Mg in the AM and 100 mg in the evening   Yes [provider]  cholecalciferol (VITAMIN D) 1000 units tablet Take 2,000 Units by mouth daily.   Yes [provider]  hydrochlorothiazide (HYDRODIURIL) 25 MG tablet Take 1 tablet (25 mg total) by mouth daily. 10/07/16  Yes [name], MD  Multiple Vitamin (MULTIVITAMIN) tablet Take 1 tablet by mouth daily. 07/11/13  Yes [name], MD  olmesartan (BENICAR) 20 MG tablet Take 1 tablet (20 mg total) by mouth daily. 09/25/16  Yes [name], MD  Omega-3 Fatty Acids (OMEGA-3 FISH OIL PO) Take 2 capsules by mouth daily.    Yes [provider]  potassium chloride (K-DUR,KLOR-CON) 10 MEQ tablet TAKE 1 TABLET TWICE DAILY 10/07/16  Yes [name], MD  pravastatin (PRAVACHOL) 40 MG tablet TAKE 1 TABLET EVERY DAY 10/07/16  Yes [name], MD  vitamin B-12 (CYANOCOBALAMIN) 100 MCG tablet Take 100 mcg by mouth daily.   Yes [provider]  XARELTO 20  MG TABS tablet TAKE 1 TABLET EVERY DAY WITH SUPPER 03/31/16  Yes [name], MD    Family History Family History  Problem Relation Age of Onset  . Hypertension Unknown        family history  . Kidney disease Maternal Uncle     Social History Social History  Substance Use Topics  . Smoking status: Former Smoker    Packs/day: 1.00    Years: 20.00    Types: Cigarettes    Quit date: 03/03/1990  . Smokeless tobacco: Never Used  . Alcohol use No     Allergies   Lisinopril   Review of Systems Review of Systems  All other systems reviewed and are negative.    Physical Exam Updated Vital Signs There were no vitals taken for this visit.  Physical Exam  Constitutional: She is oriented to person, place, and time. She appears well-developed and well-nourished.  HENT:  Head: Normocephalic.  Eyes: EOM are normal.  Neck: Normal range of motion.  Cardiovascular: Normal rate.   Pulmonary/Chest: Effort normal.  Abdominal: She exhibits no distension.  Musculoskeletal: Normal range of motion.  Neurological: She is alert and oriented to person, place, and time.  Psychiatric: She has a normal mood and affect.  Nursing note and vitals reviewed.    ED Treatments / Results  Labs (all labs ordered are listed, but only abnormal results are displayed) Labs Reviewed  RAPID URINE DRUG SCREEN, HOSP PERFORMED - Abnormal; Notable for the following:       Result Value   Benzodiazepines POSITIVE (*)    All other components within normal limits  COMPREHENSIVE METABOLIC PANEL  ETHANOL  SALICYLATE LEVEL  ACETAMINOPHEN LEVEL  CBC    EKG  EKG Interpretation None       Radiology No results found.  Procedures Procedures (including critical care time)  Medications Ordered in ED Medications  ALPRAZolam (XANAX) tablet 1 mg (not administered)  amLODipine (NORVASC) tablet 10 mg (not administered)  buPROPion (WELLBUTRIN) tablet 100 mg (not administered)    hydrochlorothiazide (HYDRODIURIL) tablet 25 mg (not administered)  multivitamin tablet 1 tablet (not administered)  pravastatin (PRAVACHOL) tablet 40 mg (not administered)  potassium chloride (K-DUR,KLOR-CON) CR tablet 10 mEq (not administered)  irbesartan (AVAPRO) tablet 150 mg (not administered)  traMADol (ULTRAM) tablet 50 mg (not administered)  rivaroxaban (XARELTO) tablet 20 mg (not administered)     Initial Impression / Assessment and Plan / ED Course  I have reviewed the triage vital signs and the nursing notes.  Pertinent labs & imaging results that were available during my care of the patient were reviewed by me and considered in my medical decision making (see chart for details).     Good insight. Well appearing. Medically clear. TTS to evaluate. Restarted home meds  Final Clinical  Impressions(s) / ED Diagnoses   Final diagnoses:  None    New Prescriptions New Prescriptions   No medications on file     Azalia Bilis, MD 12/02/16 2219

## 2016-12-02 NOTE — ED Triage Notes (Addendum)
Patient escorted by [employer] after picking up patient from [facility]. Patient was IVCd per [facility] due to patient "refusing to partake in assessment, refusing services. Per MD order, client inappropriate for unit admission." IVC papers state that "call made to the mobile crisis management advising she was suicidal. Once in her residence she expressed feelings she didn't want to live. She has access to a firearm but didn't produce it but she said she would take an overdose of pills to take herself out instead. She express she wanted to put her affairs in order so if she killed herself her dog would be ok. At times, she became agitated during the assessment. She expressed feelings that no one cared about her. She [facility] Patient is denying SI. States she is becoming more anxious due to [facility] having her sit there for 2 hours and not having any medications. Pt states she needs Xanax to sleep, her xeralto and potassium pills.

## 2016-12-02 NOTE — BHH Counselor (Signed)
TTS to see pt.   Redmond Pulling, MS, [facility], [facility] Triage Specialist [phone]

## 2016-12-03 ENCOUNTER — Encounter (HOSPITAL_COMMUNITY): Payer: Self-pay | Admitting: *Deleted

## 2016-12-03 ENCOUNTER — Telehealth: Payer: Self-pay | Admitting: *Deleted

## 2016-12-03 ENCOUNTER — Emergency Department (HOSPITAL_COMMUNITY): Payer: Medicare HMO

## 2016-12-03 DIAGNOSIS — I10 Essential (primary) hypertension: Secondary | ICD-10-CM | POA: Diagnosis not present

## 2016-12-03 DIAGNOSIS — Z79899 Other long term (current) drug therapy: Secondary | ICD-10-CM | POA: Diagnosis not present

## 2016-12-03 DIAGNOSIS — G4700 Insomnia, unspecified: Secondary | ICD-10-CM | POA: Diagnosis not present

## 2016-12-03 DIAGNOSIS — R45851 Suicidal ideations: Secondary | ICD-10-CM

## 2016-12-03 DIAGNOSIS — G4730 Sleep apnea, unspecified: Secondary | ICD-10-CM | POA: Diagnosis not present

## 2016-12-03 DIAGNOSIS — Z7901 Long term (current) use of anticoagulants: Secondary | ICD-10-CM | POA: Diagnosis not present

## 2016-12-03 DIAGNOSIS — F332 Major depressive disorder, recurrent severe without psychotic features: Secondary | ICD-10-CM | POA: Diagnosis not present

## 2016-12-03 DIAGNOSIS — E785 Hyperlipidemia, unspecified: Secondary | ICD-10-CM | POA: Diagnosis not present

## 2016-12-03 DIAGNOSIS — Z87891 Personal history of nicotine dependence: Secondary | ICD-10-CM

## 2016-12-03 DIAGNOSIS — R079 Chest pain, unspecified: Secondary | ICD-10-CM | POA: Diagnosis not present

## 2016-12-03 DIAGNOSIS — Z6841 Body Mass Index (BMI) 40.0 and over, adult: Secondary | ICD-10-CM | POA: Diagnosis not present

## 2016-12-03 DIAGNOSIS — M170 Bilateral primary osteoarthritis of knee: Secondary | ICD-10-CM | POA: Diagnosis not present

## 2016-12-03 DIAGNOSIS — Z046 Encounter for general psychiatric examination, requested by authority: Secondary | ICD-10-CM | POA: Diagnosis not present

## 2016-12-03 DIAGNOSIS — N182 Chronic kidney disease, stage 2 (mild): Secondary | ICD-10-CM | POA: Diagnosis not present

## 2016-12-03 DIAGNOSIS — Z86711 Personal history of pulmonary embolism: Secondary | ICD-10-CM | POA: Diagnosis not present

## 2016-12-03 DIAGNOSIS — N3000 Acute cystitis without hematuria: Secondary | ICD-10-CM | POA: Diagnosis not present

## 2016-12-03 DIAGNOSIS — I129 Hypertensive chronic kidney disease with stage 1 through stage 4 chronic kidney disease, or unspecified chronic kidney disease: Secondary | ICD-10-CM | POA: Diagnosis not present

## 2016-12-03 DIAGNOSIS — K219 Gastro-esophageal reflux disease without esophagitis: Secondary | ICD-10-CM | POA: Diagnosis not present

## 2016-12-03 LAB — COMPREHENSIVE METABOLIC PANEL
ALBUMIN: 3.6 g/dL (ref 3.5–5.0)
ALT: 23 U/L (ref 14–54)
ANION GAP: 8 (ref 5–15)
AST: 26 U/L (ref 15–41)
Alkaline Phosphatase: 65 U/L (ref 38–126)
BUN: 13 mg/dL (ref 6–20)
CHLORIDE: 104 mmol/L (ref 101–111)
CO2: 26 mmol/L (ref 22–32)
Calcium: 9.2 mg/dL (ref 8.9–10.3)
Creatinine, Ser: 1.15 mg/dL — ABNORMAL HIGH (ref 0.44–1.00)
GFR calc non Af Amer: 47 mL/min — ABNORMAL LOW (ref >60)
GFR, EST AFRICAN AMERICAN: 55 mL/min — AB (ref >60)
GLUCOSE: 108 mg/dL — AB (ref 65–99)
POTASSIUM: 3.4 mmol/L — AB (ref 3.5–5.1)
SODIUM: 138 mmol/L (ref 135–145)
Total Bilirubin: 0.4 mg/dL (ref 0.3–1.2)
Total Protein: 7 g/dL (ref 6.5–8.1)

## 2016-12-03 LAB — CBC
HEMATOCRIT: 39.3 % (ref 36.0–46.0)
HEMOGLOBIN: 13.1 g/dL (ref 12.0–15.0)
MCH: 26.6 pg (ref 26.0–34.0)
MCHC: 33.3 g/dL (ref 30.0–36.0)
MCV: 79.9 fL (ref 78.0–100.0)
Platelets: 189 10*3/uL (ref 150–400)
RBC: 4.92 MIL/uL (ref 3.87–5.11)
RDW: 14.6 % (ref 11.5–15.5)
WBC: 7.1 10*3/uL (ref 4.0–10.5)

## 2016-12-03 LAB — URINALYSIS, ROUTINE W REFLEX MICROSCOPIC
Bilirubin Urine: NEGATIVE
GLUCOSE, UA: NEGATIVE mg/dL
HGB URINE DIPSTICK: NEGATIVE
KETONES UR: NEGATIVE mg/dL
NITRITE: POSITIVE — AB
PROTEIN: NEGATIVE mg/dL
Specific Gravity, Urine: 1.012 (ref 1.005–1.030)
pH: 6 (ref 5.0–8.0)

## 2016-12-03 LAB — ACETAMINOPHEN LEVEL

## 2016-12-03 LAB — SALICYLATE LEVEL

## 2016-12-03 LAB — ETHANOL: Alcohol, Ethyl (B): <10 mg/dL (ref <10)

## 2016-12-03 MED ORDER — GABAPENTIN 100 MG PO CAPS
200.0000 mg | ORAL_CAPSULE | Freq: Two times a day (BID) | ORAL | Status: DC
  Administered 2016-12-03: 200 mg via ORAL
  Filled 2016-12-03: qty 2

## 2016-12-03 MED ORDER — HYDROXYZINE HCL 10 MG PO TABS
10.0000 mg | ORAL_TABLET | Freq: Two times a day (BID) | ORAL | Status: DC
  Filled 2016-12-03: qty 1

## 2016-12-03 MED ORDER — HYDROXYZINE HCL 25 MG PO TABS: 25.0000 mg | ORAL_TABLET | Freq: Two times a day (BID) | ORAL | Status: DC

## 2016-12-03 NOTE — Telephone Encounter (Signed)
Pt called and stated she needed someone to help her, that she needed to be home taking care of her dog, she states she has been depressed because her dog has gone blind and she worries about the dog, states she does not want to harm self or others. She was reassured and advised that she must cooperate and hopefully she would be home soon w/ her dog. She wanted dr Midwife to know.

## 2016-12-03 NOTE — ED Notes (Signed)
Off floor for testing 

## 2016-12-03 NOTE — ED Notes (Addendum)
Report given to Dortha Schwalbe, RN-sheriff to transport to [facility]

## 2016-12-03 NOTE — Consult Note (Signed)
[facility] Consult   Reason for Consult:  Suicidal ideation with plan Referring Physician:  EDP Patient Identification: [name] MRN:  [id number] Principal Diagnosis: Major depressive disorder, recurrent episode, severe ([place]) Diagnosis:   Patient Active Problem List   Diagnosis Date Noted  . Major depressive disorder, recurrent episode, severe ([place]) [F33.2] 12/03/2016    Priority: High  . Muscle strain [T14.8XXA] 10/02/2016  . Hepatic steatosis [K76.0] 08/27/2016  . Urge incontinence [N39.41] 04/05/2015  . Chronic kidney disease (CKD) stage G2/A1, mildly decreased glomerular filtration rate (GFR) between 60-89 mL/min/1.73 square meter and albuminuria creatinine ratio less than 30 mg/g [N18.2] 07/28/2014  . Osteopenia [M85.80] 03/08/2014  . Pulmonary hypertension ([place]) [I27.20] 11/18/2013  . Preventative health care [Z00.00] 10/21/2010  . GERD [K21.9] 10/26/2006  . Hyperlipidemia [E78.5] 03/07/2006  . Severe obesity (BMI >= 40) ([place]) [E66.01] 01/19/2006  . Major depression, chronic [F34.1] 01/19/2006  . OSA (obstructive sleep apnea) [G47.33] 01/19/2006  . HTN (hypertension) [I10] 01/19/2006  . OA (osteoarthritis) of knee [M17.10] 01/19/2006  . History of hepatitis B [Z86.19] 01/19/2006  . PULMONARY EMBOLISM, HX OF [Z86.718] 01/19/2006    Total Time spent with patient: 45 minutes  Subjective:   [name] is a 71 y.o. female patient admitted with suicidal ideation with plan to overdose.  HPI:  Patient with long history of Major depressive disorder who was brought under IVC paper taking out on her by [facility]. 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 Currently, she reports being hopeless, depressed, suicidal with plan to use her firearm or overdose on pills. Patient  gets agitated easily but denies psychosis or delusional thinking.  Past Psychiatric History: as above  Risk to Self: Suicidal Ideation: Yes-Currently Present (Per IVC, pt reported, passive SI. ) Suicidal Intent: Yes-Currently Present (Per IVC but pt denies. ) Is patient at risk for suicide?: Yes Suicidal Plan?: Yes-Currently Present (Pt reported needing a bathtub. ) Specify Current Suicidal Plan: Pt reported, she could take Xanax. Access to Means: Yes Specify Access to Suicidal Means: Pt reported, she is prescribed Xanax.  What has been your use of drugs/alcohol within the last 12 months?: Benzodiazepines.  How many times?: 0 Other Self Harm Risks: Pt denies.  Triggers for Past Attempts: None known Intentional Self Injurious Behavior: None (Pt denies. ) Risk to Others: Homicidal Ideation: No (Pt denies. ) Thoughts of Harm to Others: No Current Homicidal Intent: No Current Homicidal Plan: No Access to Homicidal Means: No Identified Victim: NA History of harm to others?: No Assessment of Violence: None Noted Violent Behavior Description: NA Does patient have access to weapons?: No (Gun) Criminal Charges Pending?: No Does patient have a court date: No Prior Inpatient Therapy: Prior Inpatient Therapy: Yes Prior Therapy Dates: Per chart, 2003 and 2011. Reason for Treatment: [facility]. Prior Outpatient Therapy: Prior Outpatient Therapy: Yes Prior Therapy Dates: Current Prior Therapy Facilty/Provider(s): Dr. Toy Care and [facility]. Reason for Treatment: Medication management and counseling.  Does patient have an ACCT team?: No Does patient have Intensive In-House Services?  : No Does patient have [facility] services? : No Does patient have P4CC services?: No  Past Medical History:  Past Medical History:  Diagnosis Date  . Arthritis    osteoarthritis , kness.  . Chronic kidney disease    Renal insuffucinecy, cr-1.33 in 10/2005.  [name]  Depression    followed by Dr. Toy Care  .  Diverticulosis of colon   . Fibrocystic breast disease   . History of hepatitis B   . History of pulmonary embolism 01/2002  . Hyperlipidemia   . Hypertension   . Left shoulder pain    2/2 fall  . Neck nodule    not erythematous, movable like cyst located at the base of posterior neck on the left side(not painful), will need follow up for   . Obesity   . Obstructive sleep apnea    refuses CPAP.  [name] Pulmonary nodule    Stable on repeat imaging. LLL nodule 5 mm    Past Surgical History:  Procedure Laterality Date  . CHOLECYSTECTOMY N/A 08/09/2015   Procedure: LAPAROSCOPIC CHOLECYSTECTOMY WITH INTRAOPERATIVE CHOLANGIOGRAM;  Surgeon: Erroll Luna, MD;  Location: [facility];  Service: General;  Laterality: N/A;  . GYN surgery     s/p excison of vulvar cyst 10/18/04 by dr. Chrissie NoaLaurance Flatten   Family History:  Family History  Problem Relation Age of Onset  . Hypertension Unknown        family history  . Kidney disease Maternal Uncle    Family Psychiatric  History:  Social History:  History  Alcohol Use No     History  Drug Use No    Social History   Social History  . Marital status: Divorced    Spouse name: N/A  . Number of children: N/A  . Years of education: N/A   Social History Main Topics  . Smoking status: Former Smoker    Packs/day: 1.00    Years: 20.00    Types: Cigarettes    Quit date: 03/03/1990  . Smokeless tobacco: Never Used  . Alcohol use No  . Drug use: No  . Sexual activity: No   Other Topics Concern  . None   Social History Narrative   Grew up in [place] housing. 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 Now back in [place]. Has dog with whom she is close.   Additional Social History:    Allergies:   Allergies  Allergen Reactions  . Lisinopril     REACTION: PT with cough    Labs:  Results for orders placed or performed during the hospital encounter of 12/02/16 (from  the past 48 hour(s))  Rapid urine drug screen (hospital performed)     Status: Abnormal   Collection Time: 12/02/16  9:29 PM  Result Value Ref Range   Opiates NONE DETECTED NONE DETECTED   Cocaine NONE DETECTED NONE DETECTED   Benzodiazepines POSITIVE (A) NONE DETECTED   Amphetamines NONE DETECTED NONE DETECTED   Tetrahydrocannabinol NONE DETECTED NONE DETECTED   Barbiturates NONE DETECTED NONE DETECTED    Comment:        DRUG SCREEN FOR MEDICAL PURPOSES ONLY.  IF CONFIRMATION IS NEEDED FOR ANY PURPOSE, NOTIFY LAB WITHIN 5 DAYS.        LOWEST DETECTABLE LIMITS FOR URINE DRUG SCREEN Drug Class       Cutoff (ng/mL) Amphetamine      1000 Barbiturate      200 Benzodiazepine   127 Tricyclics       517 Opiates          300 Cocaine          300 THC              50   Comprehensive metabolic panel  Status: Abnormal   Collection Time: 12/03/16 12:24 AM  Result Value Ref Range   Sodium 138 135 - 145 mmol/L   Potassium 3.4 (L) 3.5 - 5.1 mmol/L   Chloride 104 101 - 111 mmol/L   CO2 26 22 - 32 mmol/L   Glucose, Bld 108 (H) 65 - 99 mg/dL   BUN 13 6 - 20 mg/dL   Creatinine, Ser 1.15 (H) 0.44 - 1.00 mg/dL   Calcium 9.2 8.9 - 10.3 mg/dL   Total Protein 7.0 6.5 - 8.1 g/dL   Albumin 3.6 3.5 - 5.0 g/dL   AST 26 15 - 41 U/L   ALT 23 14 - 54 U/L   Alkaline Phosphatase 65 38 - 126 U/L   Total Bilirubin 0.4 0.3 - 1.2 mg/dL   GFR calc non Af Amer 47 (L) >60 mL/min   GFR calc Af Amer 55 (L) >60 mL/min    Comment: (NOTE) The eGFR has been calculated using the CKD EPI equation. This calculation has not been validated in all clinical situations. eGFR's persistently <60 mL/min signify possible Chronic Kidney Disease.    Anion gap 8 5 - 15  Ethanol     Status: None   Collection Time: 12/03/16 12:24 AM  Result Value Ref Range   Alcohol, Ethyl (B) <10 <10 mg/dL    Comment:        LOWEST DETECTABLE LIMIT FOR SERUM ALCOHOL IS 10 mg/dL FOR MEDICAL PURPOSES ONLY Please note change in  reference range.   Salicylate level     Status: None   Collection Time: 12/03/16 12:24 AM  Result Value Ref Range   Salicylate Lvl <6.8 2.8 - 30.0 mg/dL  Acetaminophen level     Status: Abnormal   Collection Time: 12/03/16 12:24 AM  Result Value Ref Range   Acetaminophen (Tylenol), Serum <10 (L) 10 - 30 ug/mL    Comment:        THERAPEUTIC CONCENTRATIONS VARY SIGNIFICANTLY. A RANGE OF 10-30 ug/mL MAY BE AN EFFECTIVE CONCENTRATION FOR MANY PATIENTS. HOWEVER, SOME ARE BEST TREATED AT CONCENTRATIONS OUTSIDE THIS RANGE. ACETAMINOPHEN CONCENTRATIONS >150 ug/mL AT 4 HOURS AFTER INGESTION AND >50 ug/mL AT 12 HOURS AFTER INGESTION ARE OFTEN ASSOCIATED WITH TOXIC REACTIONS.   cbc     Status: None   Collection Time: 12/03/16 12:24 AM  Result Value Ref Range   WBC 7.1 4.0 - 10.5 K/uL   RBC 4.92 3.87 - 5.11 MIL/uL   Hemoglobin 13.1 12.0 - 15.0 g/dL   HCT 39.3 36.0 - 46.0 %   MCV 79.9 78.0 - 100.0 fL   MCH 26.6 26.0 - 34.0 pg   MCHC 33.3 30.0 - 36.0 g/dL   RDW 14.6 11.5 - 15.5 %   Platelets 189 150 - 400 K/uL    Current Facility-Administered Medications  Medication Dose Route Frequency Provider Last Rate Last Dose  . amLODipine (NORVASC) tablet 10 mg  10 mg Oral Daily Jola Schmidt, MD   10 mg at 12/03/16 1020  . buPROPion [facility]) tablet 200 mg  200 mg Oral Daily Jola Schmidt, MD   200 mg at 12/03/16 1023  . gabapentin (NEURONTIN) capsule 200 mg  200 mg Oral BID Yonna Alwin, MD      . hydrochlorothiazide (HYDRODIURIL) tablet 25 mg  25 mg Oral Daily Jola Schmidt, MD   25 mg at 12/03/16 1022  . hydrOXYzine (ATARAX/VISTARIL) tablet 25 mg  25 mg Oral BID Elige Shouse, MD      . irbesartan (AVAPRO) tablet 150  mg  150 mg Oral Daily Jola Schmidt, MD   150 mg at 12/03/16 1021  . multivitamin with minerals tablet 1 tablet  1 tablet Oral Daily Jola Schmidt, MD   1 tablet at 12/03/16 1020  . potassium chloride (K-DUR,KLOR-CON) CR tablet 10 mEq  10 mEq Oral BID Jola Schmidt,  MD   10 mEq at 12/03/16 1020  . pravastatin (PRAVACHOL) tablet 40 mg  40 mg Oral Dolly Rias, MD   40 mg at 12/02/16 2314  . rivaroxaban (XARELTO) tablet 20 mg  20 mg Oral Dolly Rias, MD   20 mg at 12/02/16 2314   Current Outpatient Prescriptions  Medication Sig Dispense Refill  . ALPRAZolam (XANAX) 1 MG tablet Take 1-2 mg by mouth 4 (four) times daily as needed for anxiety or sleep. Pt takes 1 tablet up to 3 times a day and 2 tablets at bedtime    . amLODipine (NORVASC) 10 MG tablet Take 1 tablet (10 mg total) by mouth daily. 90 tablet 3  . buPROPion (WELLBUTRIN) 100 MG tablet Take 100-200 mg by mouth 2 (two) times daily. Takes 200 Mg in the AM and 100 mg in the evening    . cholecalciferol (VITAMIN D) 1000 units tablet Take 2,000 Units by mouth daily.    . hydrochlorothiazide (HYDRODIURIL) 25 MG tablet Take 1 tablet (25 mg total) by mouth daily. 90 tablet 3  . Multiple Vitamin (MULTIVITAMIN) tablet Take 1 tablet by mouth daily. 30 tablet 3  . olmesartan (BENICAR) 20 MG tablet Take 1 tablet (20 mg total) by mouth daily. 90 tablet 2  . Omega-3 Fatty Acids (OMEGA-3 FISH OIL PO) Take 2 capsules by mouth daily.     . potassium chloride (K-DUR,KLOR-CON) 10 MEQ tablet TAKE 1 TABLET TWICE DAILY 180 tablet 3  . pravastatin (PRAVACHOL) 40 MG tablet TAKE 1 TABLET EVERY DAY 90 tablet 3  . vitamin B-12 (CYANOCOBALAMIN) 100 MCG tablet Take 100 mcg by mouth daily.    Alveda Reasons 20 MG TABS tablet TAKE 1 TABLET EVERY DAY WITH SUPPER 90 tablet 3    Musculoskeletal: Strength & Muscle Tone: within normal limits Gait & Station: normal Patient leans: N/A  Psychiatric Specialty Exam: Physical Exam  Psychiatric: Her speech is normal. Her affect is angry and labile. She is agitated and withdrawn. Cognition and memory are normal. She expresses impulsivity. She exhibits a depressed mood. She expresses suicidal ideation. She expresses suicidal plans.    Review of Systems  Constitutional: Negative.    HENT: Negative.   Eyes: Negative.   Respiratory: Negative.   Cardiovascular: Negative.   Gastrointestinal: Negative.   Genitourinary: Negative.   Musculoskeletal: Negative.   Skin: Negative.   Neurological: Negative.   Endo/Heme/Allergies: Negative.   Psychiatric/Behavioral: Positive for depression and suicidal ideas. The patient is nervous/anxious and has insomnia.     Blood pressure (!) 127/93, pulse 84, temperature 98.5 F (36.9 C), temperature source Oral, resp. rate 18, SpO2 98 %.There is no height or weight on file to calculate BMI.  General Appearance: Casual  Eye Contact:  Good  Speech:  Clear and Coherent  Volume:  Increased  Mood:  Depressed and Irritable  Affect:  Constricted and Labile  Thought Process:  Coherent and Descriptions of Associations: Intact  Orientation:  Full (Time, Place, and Person)  Thought Content:  Logical  Suicidal Thoughts:  Yes.  with intent/plan  Homicidal Thoughts:  No  Memory:  Immediate;   Good Recent;   Good Remote;  Good  Judgement:  Poor  Insight:  Shallow  Psychomotor Activity:  Increased  Concentration:  Concentration: Fair and Attention Span: Fair  Recall:  Good  Fund of Knowledge:  Good  Language:  Good  Akathisia:  No  Handed:  Right  AIMS (if indicated):     Assets:  Communication Skills  ADL's:  Intact  Cognition:  WNL  Sleep:   poor     Treatment Plan Summary: Daily contact with patient to assess and evaluate symptoms and progress in treatment and Medication management Start Gabapentin 200 mg bid for agitation/anxiety, continue Wellbutrin 200 mg daily and add Hydroxyzine 25 mg bid for anxiety. Discontinue Wellbutrin 157m at bedtime due to insomnia.  Disposition: Recommend psychiatric Inpatient admission when medically cleared.  ACorena Pilgrim MD 12/03/2016 10:54 AM

## 2016-12-03 NOTE — BH Assessment (Signed)
[facility] Assessment Progress Note  Per Thedore Mins, MD, this pt requires psychiatric hospitalization at this time.  Pt presents under IVC initiated by mobile crisis, which Dr Jannifer Franklin has upheld.  At 13:02 [facility] calls from [facility] to report that pt has been accepted to their facility by Dr Tonita Phoenix to their [facility], Rm 153-1.  Laveda Abbe, FNP concurs with this decision.  Pt's nurse, Misty Stanley, has been notified, and agrees to call report to [phone].  Pt is to be transported via [facility].  Please note that pt must arrive either before 23:00 tonight or after 06:00 tomorrow.  Doylene Canning, MA Triage Specialist [phone]

## 2016-12-03 NOTE — ED Notes (Signed)
Communicated with patient plans for her to go to inpatient psychiatric treatment. She was very frustrated with these plans due to her having no intention on harming herself. She stated that she just wanted to talk to someone. She stated that the only time that we needed to worry about her harming herself is if her dog died. She is very concerned about her dog at this time even though her neighbor has agreed to check in and take it out every couple of hours. She said that her dog is old and has recently become blind.

## 2016-12-03 NOTE — ED Notes (Signed)
LM with sheriff's department to set up transportation to [facility]

## 2016-12-03 NOTE — ED Notes (Signed)
Patient is upset regarding how [facility] twisted what she said and had her IVC'ed-patient states she is not suicidal just going through a lot of stress-doesn't not agree with other people's interpretation of her thoughts

## 2016-12-04 NOTE — Telephone Encounter (Signed)
Thanks. Following EMR notes.

## 2016-12-18 ENCOUNTER — Encounter: Payer: Self-pay | Admitting: Internal Medicine

## 2016-12-18 ENCOUNTER — Other Ambulatory Visit: Payer: Self-pay | Admitting: Internal Medicine

## 2016-12-18 ENCOUNTER — Ambulatory Visit (INDEPENDENT_AMBULATORY_CARE_PROVIDER_SITE_OTHER): Payer: Medicare HMO | Admitting: Internal Medicine

## 2016-12-18 DIAGNOSIS — R351 Nocturia: Secondary | ICD-10-CM | POA: Diagnosis not present

## 2016-12-18 DIAGNOSIS — F339 Major depressive disorder, recurrent, unspecified: Secondary | ICD-10-CM

## 2016-12-18 DIAGNOSIS — Z820 Family history of epilepsy and other diseases of the nervous system: Secondary | ICD-10-CM

## 2016-12-18 DIAGNOSIS — F329 Major depressive disorder, single episode, unspecified: Secondary | ICD-10-CM

## 2016-12-18 DIAGNOSIS — R3915 Urgency of urination: Secondary | ICD-10-CM

## 2016-12-18 DIAGNOSIS — R300 Dysuria: Secondary | ICD-10-CM | POA: Diagnosis not present

## 2016-12-18 DIAGNOSIS — R32 Unspecified urinary incontinence: Secondary | ICD-10-CM

## 2016-12-18 DIAGNOSIS — R252 Cramp and spasm: Secondary | ICD-10-CM | POA: Diagnosis not present

## 2016-12-18 DIAGNOSIS — Z6841 Body Mass Index (BMI) 40.0 and over, adult: Secondary | ICD-10-CM | POA: Diagnosis not present

## 2016-12-18 DIAGNOSIS — Z79899 Other long term (current) drug therapy: Secondary | ICD-10-CM

## 2016-12-18 DIAGNOSIS — Z972 Presence of dental prosthetic device (complete) (partial): Secondary | ICD-10-CM

## 2016-12-18 DIAGNOSIS — R350 Frequency of micturition: Secondary | ICD-10-CM | POA: Diagnosis not present

## 2016-12-18 DIAGNOSIS — K08109 Complete loss of teeth, unspecified cause, unspecified class: Secondary | ICD-10-CM | POA: Insufficient documentation

## 2016-12-18 DIAGNOSIS — N3941 Urge incontinence: Secondary | ICD-10-CM

## 2016-12-18 DIAGNOSIS — Z87440 Personal history of urinary (tract) infections: Secondary | ICD-10-CM

## 2016-12-18 DIAGNOSIS — K000 Anodontia: Secondary | ICD-10-CM

## 2016-12-18 MED ORDER — TRAZODONE HCL 50 MG PO TABS: 50.0000 mg | ORAL_TABLET | Freq: Every evening | ORAL | 0 refills | Status: DC | PRN

## 2016-12-18 NOTE — Patient Instructions (Signed)
1. I gave you a handout on asymptomatic bacteriuria 2. See me in 3 months 3. I am glad you are doing better

## 2016-12-18 NOTE — Progress Notes (Signed)
   Subjective:    Patient ID: [name], female    DOB: Jun 03, 1945, 71 y.o.   MRN: [id number]  HPI  [name] is here for Depression F/U. Please see the A&P for the status of the pt's chronic medical problems.  ROS : per ROS section and in problem oriented charting. All other systems are negative.  PMHx, Soc hx, and / or Fam hx : currently moving into her new 1 bedroom. One brother with parkinson's (she is estranged from him). One sister in [place] who she is starting to develop a relationship after yr of not talking.    Review of Systems  Constitutional: Negative for unexpected weight change.  Cardiovascular:       Had lower ext edema from food in geri pysch  Genitourinary: Positive for dysuria and frequency.       + nocturia, incontinence  Musculoskeletal:       Had some muscle cramping while in geri-pyshc Stiff this AM bc slept on floor (moving)      Objective:   Physical Exam  Constitutional: She appears well-developed and well-nourished. No distress.  HENT:  Head: Normocephalic and atraumatic.  Right Ear: External ear normal.  Left Ear: External ear normal.  Nose: Nose normal.  Neurological: She is alert.  Skin: Skin is warm and dry. She is not diaphoretic.  Psychiatric: She has a normal mood and affect. Her behavior is normal. Judgment and thought content normal.      Assessment & Plan:

## 2016-12-18 NOTE — Assessment & Plan Note (Signed)
This problem is chronic and stable. She states her weight had decreased down to the 350s. However, she states while she was inpatient that her diet was altered and had too much salt which caused her to retain fluid and her weight increased a bit. She recently started a [facility] month [facility] located [facility] [address] for weight loss. She has pain out of the pocket for this and includes nutrition, counseling, and exercise. She is eating regular food and supplementing with 2 protein shakes a day first max. She intends to continue with this program. I have reviewed their notes in Epic.  PLAN : Continue to follow

## 2016-12-18 NOTE — Assessment & Plan Note (Addendum)
This problem is chronic and recently uncontrolled. She was able to give me a good history of the events that occurred earlier this month. She had recently been given the contact information for the crisis line by her mental health providers.She was having a difficult day and called that number. A worker came to her home and apparently they had a very good conversation because of the end of the conversation, they were laughing and joking together.  A couple of hours later, the police arrived at her home with involuntary commitment papers due to a comment she had made during the conversation. More details are available in the ED notes which I have reviewed. She [facility] one [facility] before being transferred to [facility] psychiatric unit [facility] [place]. She spent 7 days there. At some point she contacted her long-term counselor who advised her to make the best of the situation. She stated the good that did came out of the situation was that she was started on a new SSRI. Her outpatient psychiatrist had wanted to do this previously and the patient did not agree to start a new medicine. She now knows that she did in fact need additional medication.  She was able to detail the changes that have been made which are 1. Started trazodone to help her sleep. She requested a refill today. 2. Started a new medicine that started with the letters Trin. I think this is Trintellix, an SSRI.  I reviewed the did get an she was not started on trintellix but was started on Abilify 2 mg 3. Changed her Wellbutrin to a 24-hour preparation 4. Xanax limited to 3 mg per day (per records was 1 mg daily at bedtime when necessary because there is a lot of confusion over her benzo and whether she was to be on Xanax or Librium. The discharge summary indicated that per the [facility] narcotic database, she had not filled any benzo.)  She plans on continuing to see her counselor, her psychiatrist (not going to return to [facility]),  and has an appointment for CBT on Monday.  Overall, I think she is improved in terms of her mood, outlook, and behavior.  PLAN : I will refill her trazodone once but will ask that she gets all of her mental health medications from her psychiatrist in the future. I will review the packet of old records that I received from the [facility].  Otherwise continue medications

## 2016-12-18 NOTE — Assessment & Plan Note (Signed)
This problem is new. She now has had all of her teeth pulled. A cyst was found on the upper jaw which was scraped to be removed. Apparently it was sent to pathology and was negative according to the patient. She now has upper dentures and will be getting lower dentures at some point in the future.  PLAN : Continue to follow

## 2016-12-18 NOTE — Assessment & Plan Note (Signed)
His problem is chronic and stable. She continues to have urinary frequency, incontinence, urgency, and nocturia.  [facility], she had UA's that were positive for leukocyte esterase and nitrites. She was treated for a UTI. She denies dysuria before the antibiotics and the frequency and incontinence did not improve after the antibiotics. Therefore, I think she only had asymptomatic bacteriuria and not a UTI. She requested a UA today to ensure the infection is gone but we discussed this is not indicated and I gave her a handout on asymptomatic bacteriuria.  PLAN : continue to follow

## 2016-12-19 NOTE — Addendum Note (Signed)
Addended by: Bufford SpikesFULCHER, Korey Prashad N on: 12/19/2016 09:20 AM   Modules accepted: Orders

## 2016-12-22 DIAGNOSIS — F341 Dysthymic disorder: Secondary | ICD-10-CM | POA: Diagnosis not present

## 2017-01-02 DIAGNOSIS — F341 Dysthymic disorder: Secondary | ICD-10-CM | POA: Diagnosis not present

## 2017-01-16 DIAGNOSIS — F341 Dysthymic disorder: Secondary | ICD-10-CM | POA: Diagnosis not present

## 2017-01-20 DIAGNOSIS — Z9989 Dependence on other enabling machines and devices: Secondary | ICD-10-CM | POA: Diagnosis not present

## 2017-01-20 DIAGNOSIS — E785 Hyperlipidemia, unspecified: Secondary | ICD-10-CM | POA: Diagnosis not present

## 2017-01-20 DIAGNOSIS — F322 Major depressive disorder, single episode, severe without psychotic features: Secondary | ICD-10-CM | POA: Diagnosis not present

## 2017-01-20 DIAGNOSIS — I10 Essential (primary) hypertension: Secondary | ICD-10-CM | POA: Diagnosis not present

## 2017-01-20 DIAGNOSIS — Z713 Dietary counseling and surveillance: Secondary | ICD-10-CM | POA: Diagnosis not present

## 2017-01-20 DIAGNOSIS — G4733 Obstructive sleep apnea (adult) (pediatric): Secondary | ICD-10-CM | POA: Diagnosis not present

## 2017-01-20 DIAGNOSIS — Z6841 Body Mass Index (BMI) 40.0 and over, adult: Secondary | ICD-10-CM | POA: Diagnosis not present

## 2017-01-29 DIAGNOSIS — Z6841 Body Mass Index (BMI) 40.0 and over, adult: Secondary | ICD-10-CM | POA: Diagnosis not present

## 2017-01-29 DIAGNOSIS — Z713 Dietary counseling and surveillance: Secondary | ICD-10-CM | POA: Diagnosis not present

## 2017-01-29 DIAGNOSIS — F341 Dysthymic disorder: Secondary | ICD-10-CM | POA: Diagnosis not present

## 2017-02-12 ENCOUNTER — Telehealth: Payer: Self-pay | Admitting: *Deleted

## 2017-02-12 DIAGNOSIS — Z9989 Dependence on other enabling machines and devices: Secondary | ICD-10-CM | POA: Diagnosis not present

## 2017-02-12 DIAGNOSIS — R7303 Prediabetes: Secondary | ICD-10-CM | POA: Diagnosis not present

## 2017-02-12 DIAGNOSIS — I10 Essential (primary) hypertension: Secondary | ICD-10-CM | POA: Diagnosis not present

## 2017-02-12 DIAGNOSIS — F341 Dysthymic disorder: Secondary | ICD-10-CM | POA: Diagnosis not present

## 2017-02-12 DIAGNOSIS — G4733 Obstructive sleep apnea (adult) (pediatric): Secondary | ICD-10-CM | POA: Diagnosis not present

## 2017-02-12 DIAGNOSIS — E8881 Metabolic syndrome: Secondary | ICD-10-CM | POA: Diagnosis not present

## 2017-02-12 DIAGNOSIS — Z6841 Body Mass Index (BMI) 40.0 and over, adult: Secondary | ICD-10-CM | POA: Diagnosis not present

## 2017-02-12 NOTE — Telephone Encounter (Signed)
[employer] called to clarify Trazodone. Last ordered 12/18/16 OV (50 mg @ bedtime prn)  Also has an active rx from Dr. Milagros Evenerupinder Kaur (psych) ordered 12/08/16 (50 mg, 1-3 tabs @ bedtime prn) x 1 year supply.  Requesting to clarify which order to keep or d/c. Pls advise.  [facility] direct line [phone].

## 2017-02-12 NOTE — Telephone Encounter (Signed)
Pls delete my Rx and keep pysch's Rx thanks

## 2017-02-13 NOTE — Telephone Encounter (Signed)
[employer] notified: d/c rx from Dr. Rogelia BogaButcher & keep psych's.

## 2017-02-16 ENCOUNTER — Other Ambulatory Visit: Payer: Self-pay | Admitting: Internal Medicine

## 2017-02-16 DIAGNOSIS — F341 Dysthymic disorder: Secondary | ICD-10-CM | POA: Diagnosis not present

## 2017-02-17 NOTE — Telephone Encounter (Signed)
Next appt scheduled  03/26/17 with PCP. 

## 2017-02-27 ENCOUNTER — Encounter: Payer: Self-pay | Admitting: *Deleted

## 2017-03-09 ENCOUNTER — Encounter: Payer: Self-pay | Admitting: *Deleted

## 2017-03-09 ENCOUNTER — Other Ambulatory Visit: Payer: Self-pay | Admitting: *Deleted

## 2017-03-09 NOTE — Patient Outreach (Addendum)
[name]) Care Management  03/09/2017  [name] 03-11-1945 [id number]   CSW was able to make initial contact with patient today to perform phone assessment, as well as assess and assist with social work needs and services.  CSW introduced self, explained role and types of services provided through [place] [facility] ([place] [facility]).  CSW further explained to patient that CSW received a referral from [name], representative with [facility] requesting that [place] assist patient with avoiding an upcoming eviction from her current place of residence.  CSW obtained two HIPAA compliant identifiers from patient, which included patient's name and date of birth. Patient remembers having worked with CSW extensively in the past to try and obtain a two-bedroom for single occupancy living.  Patient is requesting a two-bedroom apartment because she believes that she requires a room just to house all her "depression equipment".  CSW completed all the necessary paperwork, along with patient's Primary Care Physician, [name], roughly one year ago, but patient was denied.  Patient has been told on several different occasions that she does not qualify for a two-bedroom apartment because she lives alone.  CSW explained to patient that [place] spoke with a representative at the [employer], who reports that patient will continue to be denied for a two-bedroom apartment as long as she lives alone, not seeing the need to start the entire application process all over again, as nothing has changed with patient's current living arrangements. With regards to the eviction notice, patient has been told that she must get rid of her dog, or she will be evicted from her current apartment.  Patient admits that she has not been able to clean up after her dog like she should because of her mental state; therefore, she has received a lot of complaints from other  tenants in her building.  Patient reported that she is not able to take her dog outside, so the dog is going on the floor in her apartment.  CSW offered counseling and supportive services to patient, not really sure what else can be offered at this time.  Patient was most appreciative of the call and for CSW's willingness to assist her, but realizes she has some hard decisions she must make to avoid losing her apartment. CSW will perform a case closure on patient, as all goals of treatment have been met from social work standpoint and no additional social work needs have been identified at this time.  CSW will fax an update to patient's Primary Care Physician, [name] to ensure that they are aware of CSW's involvement with patient's plan of care.  CSW will submit a case closure request to [name], Care Management Assistant with [facility], in the form of an In [employer].   [name], BSW, MSW, LCSW  Licensed Education officer, environmental Health System  Mailing [place] [address] [place], [place] [address] Physical Address-[address], [place], [place] [address] Toll Free Main # [phone] Fax # [phone] Cell # [phone]  Office # [phone] [name]    Addendum: CSW received a call from [name], Care Management Assistant with [place] [facility], indicating that she received a call from [place] [phone]) with [employer], requesting that CSW re-open patient's case.  [name] reported to [name] that patient did not understand the discussion we had earlier this morning, nor did patient understand information provided to  her by CSW, requesting that CSW get back in contact with patient.   CSW made an attempt to try and contact patient this afternoon; however, patient was unavailable.  I HIPAA compliant message was left for patient on  voicemail and CSW is currently awaiting a return call.  [name], BSW, MSW, LCSW  Licensed Education officer, environmental Health System  Mailing [place] [address], [place], [place] [address] Physical Address[address], [place], [place] [address] Toll Free Main # [phone] Fax # [phone] Cell # [phone]  Office # [phone] [name]

## 2017-03-12 ENCOUNTER — Other Ambulatory Visit: Payer: Self-pay | Admitting: *Deleted

## 2017-03-12 NOTE — Patient Outreach (Signed)
[facility](THN) Care Management  03/12/2017  [name] Jan 24, 1946 [id number]   CSW was able to make contact with patient today to try and get a better understanding of what patient is referring to when requesting a "Patient Advocate" to speak on her behalf regarding her Section 8 Housing status with the [employer] [employer].  CSW had a very lengthy conversation with patient, finally learning that patient has actually already moved into a two-bedroom apartment, without the approval of Section 8 Housing.  Patient is now trying to appeal the need for the two-bedroom apartment, as Section 8 is refusing to pay a portion of the apartment.  Patient reported that she should only be paying around $300.00 per month for her two-bedroom apartment, under Section 8; however, she is currently paying almost $700.00 per month.  Patient requested that she be able to email CSW what she has already submitted in writing, wanting CSW to review the documents and then contact patient to discuss further to see how CSW can be of assistance to her.  CSW agreed to also write another letter on patient's behalf, if that is what patient is requesting?  Patient was not sure how CSW could be of assistance, just that she knows that she wants "a professional to act on her behalf".  CSW agreed to review the email and then contact patient to discuss further. [facility] CM Care Plan Problem One     Most Recent Value  Care Plan Problem One  Appeal housing determination through Section 8 Housing.  Role Documenting the Problem One  Clinical Social Worker  Care Plan for Problem One  Active  [facility] Long Term Goal   CSW will assist patient with trying to appeal her Section 8 status through the [employer] [employer], within the next 45 days.  THN Long Term Goal Start Date  03/12/17  Interventions for Problem One Long Term Goal  Patient will submit to CSW in writing what she has already submitted to Section 8 for CSW  to review.  THN CM Short Term Goal #1   Patient will request a Reasonable Accommodation Letter from the [employer] [employer], within the next two weeks.  THN CM Short Term Goal #1 Start Date  03/12/17  Interventions for Short Term Goal #1  CSW will complete the Reasonable Accommodation Letter and submit to the [employer] [employer] for processing.  THN CM Short Term Goal #2   Patient will [facility], within the next two weeks.  THN CM Short Term Goal #2 Start Date  03/12/17  Interventions for Short Term Goal #2  CSW will review patient's medical records and submit a letter of appeals in writing to the [employer] [employer].     [name], BSW, MSW, LCSW  Licensed Restaurant manager, fast foodClinical Social Worker  [facility]  [address] [place], [place], [place] [address] Physical Address-[address], [place], [place] [address] Toll Free Main # [phone] Fax # [phone] Cell # [phone]  Office # [phone] [email or web address]

## 2017-03-12 NOTE — Patient Outreach (Signed)
Request received from Joanna Saporito, LCSW to mail patient personal care resources.  Information mailed today. 

## 2017-03-17 ENCOUNTER — Other Ambulatory Visit: Payer: Self-pay | Admitting: *Deleted

## 2017-03-17 ENCOUNTER — Encounter: Payer: Self-pay | Admitting: *Deleted

## 2017-03-17 NOTE — Patient Outreach (Signed)
[facility]) Care Management  03/17/2017  FRITZI SCRIPTER 06-22-1945 [id number]   CSW received three secure email messages from patient via work email providing all of the following information: Denial Letter from [place] and Final Appeal/Denial Letters from the [place] [employer] CSW took quite a bit of time to review all of the information, only to learn that patient continues to be denied Section 8 Housing status in her current two-bedroom apartment, due to not receiving prior approval/authorization from the [employer] prior to moving from her one-bedroom apartment to her two-bedroom apartment.  In addition, the [employer] indicated that patient does not meet criteria for "Reasonable Accommodation", even after providing sufficient evidence of her mental health status and diagnosis. CSW was also able to make contact with patient's prior Legal Aid Staff Attorney, Mauro Kaufmann, who explains, "No further legal action will be taken and patient's case has been closed".  CSW explained all of the following information to patient in great detail, inquiring as to which action patient would like for CSW to take next, as CSW does not foresee any additional options at this point in time?  CSW has already written a letter on patient's behalf and submitted it to the [employer], along with a letter from patient's Primary Care Physician, [name].  CSW tried to advocate for patient to the best of her ability, but patient continues to be denied Section 8 Housing coverage for her current two-bedroom apartment.  A representative from Section 8 Housing reported that patient's current two-bedroom apartment is not even Section 8 approved; therefore, patient has been encouraged to move back into a one-bedroom Section 8 approved apartment and begin the process again if she  wishes to try and continue to appeal the denial.  However, CSW was also told that patient's are limited to only three appeals for the same matter. Patient admitted that she is not willing to accept the fact that nothing else can be done with regards to someone advocating on her behalf.  CSW explained to patient that she has already met her maximum amount of denial letters, that even if she submitted an additional appeal, it would not be taken into consideration.  In addition, CSW explained to patient that she had an attorney with Legal Aid representing her case and patient was still denied, hence them having already closed her case.  The only other option that CSW can suggest to patient at this point is for her to hire an outside attorney for legal representation, agreeing to assist patient with locating a reputable Elder Training and development officer. CSW will perform a case closure on patient, as all goals of treatment have been met from social work standpoint and no additional social work needs have been identified at this time.  CSW will fax an update to patient's Primary Care Physician, [name] to ensure that they are aware of CSW's involvement with patient's plan of care.  CSW will submit a case closure request to Alycia Rossetti, Care Management Assistant with [facility], in the form of an In [employer].   [facility] CM Care Plan Problem One     Most Recent Value  Care Plan Problem One  Appeal housing determination through [place].  Role Documenting the Problem One  Clinical Social Worker  Care Plan for Problem One  Active  THN Long Term Goal   CSW will assist  patient with trying to appeal her Section 8 status through the [employer], within the next 45 days.  THN Long Term Goal Start Date  03/12/17  THN Long Term Goal Met Date  03/17/17  Interventions for Problem One Long Term Goal  Patient will submit to CSW in writing what she has already  submitted to Section 8 for CSW to review.  THN CM Short Term Goal #1   Patient will request a Reasonable Accommodation Letter from the [employer], within the next two weeks.  THN CM Short Term Goal #1 Start Date  03/12/17  [facility] CM Short Term Goal #1 Met Date  03/17/17  Interventions for Short Term Goal #1  CSW will complete the Reasonable Accommodation Letter and submit to the [employer] for processing.  THN CM Short Term Goal #2   Patient will request her medical records from [facility], within the next two weeks.  THN CM Short Term Goal #2 Start Date  03/12/17  [facility] CM Short Term Goal #2 Met Date  03/17/17  Interventions for Short Term Goal #2  CSW will review patient's medical records and submit a letter of appeals in writing to the [employer].    Nat Christen, BSW, MSW, LCSW  Licensed Education officer, environmental Health System  Mailing [place] [address], [place], [place] [address] Physical Address-[address], [place], [place] [address] Toll Free Main # [phone] Fax # [phone] Cell # [phone]  Office # [phone] [email or web address]

## 2017-03-26 ENCOUNTER — Other Ambulatory Visit: Payer: Self-pay

## 2017-03-26 ENCOUNTER — Encounter: Payer: Self-pay | Admitting: Internal Medicine

## 2017-03-26 ENCOUNTER — Ambulatory Visit (INDEPENDENT_AMBULATORY_CARE_PROVIDER_SITE_OTHER): Payer: Medicare HMO | Admitting: Internal Medicine

## 2017-03-26 DIAGNOSIS — Z6841 Body Mass Index (BMI) 40.0 and over, adult: Secondary | ICD-10-CM | POA: Diagnosis not present

## 2017-03-26 DIAGNOSIS — K08109 Complete loss of teeth, unspecified cause, unspecified class: Secondary | ICD-10-CM

## 2017-03-26 DIAGNOSIS — N3941 Urge incontinence: Secondary | ICD-10-CM | POA: Diagnosis not present

## 2017-03-26 DIAGNOSIS — K000 Anodontia: Secondary | ICD-10-CM

## 2017-03-26 DIAGNOSIS — Z87891 Personal history of nicotine dependence: Secondary | ICD-10-CM

## 2017-03-26 DIAGNOSIS — F339 Major depressive disorder, recurrent, unspecified: Secondary | ICD-10-CM

## 2017-03-26 DIAGNOSIS — Z79899 Other long term (current) drug therapy: Secondary | ICD-10-CM

## 2017-03-26 DIAGNOSIS — Z86711 Personal history of pulmonary embolism: Secondary | ICD-10-CM

## 2017-03-26 DIAGNOSIS — F329 Major depressive disorder, single episode, unspecified: Secondary | ICD-10-CM

## 2017-03-26 DIAGNOSIS — I10 Essential (primary) hypertension: Secondary | ICD-10-CM

## 2017-03-26 DIAGNOSIS — Z972 Presence of dental prosthetic device (complete) (partial): Secondary | ICD-10-CM

## 2017-03-26 DIAGNOSIS — Z86718 Personal history of other venous thrombosis and embolism: Secondary | ICD-10-CM

## 2017-03-26 DIAGNOSIS — K0889 Other specified disorders of teeth and supporting structures: Secondary | ICD-10-CM

## 2017-03-26 DIAGNOSIS — R200 Anesthesia of skin: Secondary | ICD-10-CM | POA: Diagnosis not present

## 2017-03-26 DIAGNOSIS — Z7901 Long term (current) use of anticoagulants: Secondary | ICD-10-CM | POA: Diagnosis not present

## 2017-03-26 NOTE — Assessment & Plan Note (Signed)
This problem is chronic and stable. She has her upper dentures but has not yet gotten her lower dentures although she is following up with the dental school to get those.  PLAN : Continue to follow

## 2017-03-26 NOTE — Assessment & Plan Note (Signed)
This problem is chronic and improved. She is working [facility] although she has cut her appointments on hold since her mental health issues flare. Despite that, she has continued to lose weight and has lost 31 pounds since June which averages 5 pounds per month. I congratulated her on the success but she is hesitant to declare herself the success as she has lost weight before but always gained it back.  PLAN : follow weieght

## 2017-03-26 NOTE — Patient Instructions (Signed)
1. Pls see me in 4-7 months 2. Great job on your weight loss

## 2017-03-26 NOTE — Assessment & Plan Note (Signed)
This problem is chronic and stable. She continues to have to use disposable underwear or barrier regards as she continues to have urinary incontinence. With her weight loss, she feels that she has longer time to get to the toilet when she has urge to urinate.  PLAN : Continue to follow:

## 2017-03-26 NOTE — Assessment & Plan Note (Signed)
This problem is chronic and stable. She is on amlodipine 10, Benicar 20, and HCTZ 25. Her blood pressure is at goal & she is having no side effects to these medications.  PLAN:  Cont current meds    BP Readings from Last 3 Encounters:  03/26/17 132/81  12/18/16 (!) 130/58  12/03/16 (!) 108/56

## 2017-03-26 NOTE — Assessment & Plan Note (Signed)
This problem is chronic and stable. Her medications have been changed by her mental health provider to Trintellix 20 mg and Wellbutrin 100 mg. She also uses Xanax mostly at bedtime. She is still going to see her peer support which is a one-on-one session and weekly and she feels it is extremely beneficial. She also goes to her depression support group meetings. She has not read her daily newspaper butt is resistant to cancel her subscription because she has always gotten and read the newspaper. Her mood today, seems much better than I have seen her.  PLAN : Follow mental health providers recommendations

## 2017-03-26 NOTE — Assessment & Plan Note (Signed)
This problem is chronic and stable. She remains on her DOAC  without any side effects and is tolerating it well.  PLAN:  Cont current meds

## 2017-03-26 NOTE — Progress Notes (Signed)
   Subjective:    Patient ID: Barrie Dunkerelestine T Urquidi, female    DOB: December 07, 1945, 72 y.o.   MRN: [id number]  HPI  Yetta T Sharol HarnessSimmons is here for HTN F/U. Please see the A&P for the status of the pt's chronic medical problems.  ROS : per ROS section and in problem oriented charting. All other systems are negative.  PMHx, Soc hx, and / or Fam hx : Her mixed breed dog is 70 yrs old and blind and deaf.   Review of Systems  Constitutional: Negative for unexpected weight change.       Intentional weight loss  Genitourinary: Positive for urgency.       + urinary incontinence  Neurological: Positive for numbness.  Psychiatric/Behavioral: Positive for dysphoric mood and sleep disturbance.       Objective:   Physical Exam  Constitutional: She appears well-developed and well-nourished. No distress.  HENT:  Head: Normocephalic and atraumatic.  Right Ear: External ear normal.  Left Ear: External ear normal.  Nose: Nose normal.  Eyes: Conjunctivae and EOM are normal. Right eye exhibits no discharge. Left eye exhibits discharge. No scleral icterus.  Neck: Normal range of motion. Neck supple.  Cardiovascular: Normal rate, regular rhythm and normal heart sounds.  +2 DP pulses  Neurological: She is alert.  Skin: Skin is warm. She is diaphoretic. No erythema.  Psychiatric: She has a normal mood and affect. Her behavior is normal. Judgment and thought content normal.      Assessment & Plan:

## 2017-04-01 ENCOUNTER — Encounter: Payer: Self-pay | Admitting: Internal Medicine

## 2017-04-16 DIAGNOSIS — I10 Essential (primary) hypertension: Secondary | ICD-10-CM | POA: Diagnosis not present

## 2017-04-16 DIAGNOSIS — F339 Major depressive disorder, recurrent, unspecified: Secondary | ICD-10-CM | POA: Diagnosis not present

## 2017-04-16 DIAGNOSIS — R7303 Prediabetes: Secondary | ICD-10-CM | POA: Diagnosis not present

## 2017-04-16 DIAGNOSIS — Z6841 Body Mass Index (BMI) 40.0 and over, adult: Secondary | ICD-10-CM | POA: Diagnosis not present

## 2017-04-23 ENCOUNTER — Telehealth: Payer: Self-pay | Admitting: Pharmacist

## 2017-04-23 DIAGNOSIS — E785 Hyperlipidemia, unspecified: Secondary | ICD-10-CM

## 2017-04-23 DIAGNOSIS — I10 Essential (primary) hypertension: Secondary | ICD-10-CM

## 2017-04-23 DIAGNOSIS — E876 Hypokalemia: Secondary | ICD-10-CM

## 2017-04-24 MED ORDER — HYDROCHLOROTHIAZIDE 25 MG PO TABS: 25.0000 mg | ORAL_TABLET | Freq: Every day | ORAL | 0 refills | Status: DC

## 2017-04-24 MED ORDER — OLMESARTAN MEDOXOMIL 20 MG PO TABS: 20.0000 mg | ORAL_TABLET | Freq: Every day | ORAL | 0 refills | Status: DC

## 2017-04-24 MED ORDER — AMLODIPINE BESYLATE 10 MG PO TABS: 10.0000 mg | ORAL_TABLET | Freq: Every day | ORAL | 0 refills | Status: DC

## 2017-04-24 MED ORDER — PRAVASTATIN SODIUM 40 MG PO TABS
40.0000 mg | ORAL_TABLET | Freq: Every evening | ORAL | 0 refills | Status: DC
Start: 2017-04-24 — End: 2017-04-24

## 2017-04-24 MED ORDER — POTASSIUM CHLORIDE CRYS ER 10 MEQ PO TBCR: 10.0000 meq | EXTENDED_RELEASE_TABLET | Freq: Two times a day (BID) | ORAL | 0 refills | Status: DC

## 2017-04-24 NOTE — Progress Notes (Addendum)
Patient reports losing her medications and requested a 1-time fill on amlodipine, potassium, pravastatin, HCTZ, and olmesartan. One-time prescriptions sent in.

## 2017-05-11 DIAGNOSIS — Z6841 Body Mass Index (BMI) 40.0 and over, adult: Secondary | ICD-10-CM | POA: Diagnosis not present

## 2017-05-11 DIAGNOSIS — Z713 Dietary counseling and surveillance: Secondary | ICD-10-CM | POA: Diagnosis not present

## 2017-05-20 ENCOUNTER — Other Ambulatory Visit: Payer: Self-pay | Admitting: Internal Medicine

## 2017-05-21 NOTE — Telephone Encounter (Signed)
Next appt scheduled  6/27 with PCP. 

## 2017-05-22 ENCOUNTER — Other Ambulatory Visit: Payer: Self-pay | Admitting: Pharmacist

## 2017-05-22 DIAGNOSIS — I10 Essential (primary) hypertension: Secondary | ICD-10-CM

## 2017-05-22 DIAGNOSIS — E876 Hypokalemia: Secondary | ICD-10-CM

## 2017-05-22 NOTE — Telephone Encounter (Signed)
Per patient she is [facility] & has enough meds @ this time.  [facility]. This is [facility] & will be refused.

## 2017-06-16 ENCOUNTER — Telehealth: Payer: Self-pay | Admitting: Internal Medicine

## 2017-06-16 NOTE — Telephone Encounter (Signed)
Rec'd HIPPA Release From from the [facility] Human Relations Department requesting you to give them a call in reference to the Accommodations Forms previously completed by you.  Patricia Haywardllen Hunt would like to set up a date and time that is good for you to talk to him.  Also placed the ROI signed by the patient giving permission for you to talk to him on her behalf and placed in your box to sign off on.

## 2017-06-16 NOTE — Telephone Encounter (Signed)
Anytime tomorrow. 11 AM, 2PM, 10 AM, 10:30. Tell him to pick a time and I will be by phone

## 2017-06-17 NOTE — Telephone Encounter (Signed)
Left Mr. Alto DenverHunt a message to call in around 2pm.  If he does not call back do you have a time tomorrow that is suitable to your schedule and I will let him know again?

## 2017-06-17 NOTE — Telephone Encounter (Signed)
Thanks! If he calls I will transfer you the call tomorrow as he was out of the office today.

## 2017-06-17 NOTE — Telephone Encounter (Signed)
Btw 1:30 and 4PM

## 2017-07-03 ENCOUNTER — Encounter: Payer: Self-pay | Admitting: Internal Medicine

## 2017-08-03 ENCOUNTER — Other Ambulatory Visit: Payer: Self-pay | Admitting: Internal Medicine

## 2017-08-03 DIAGNOSIS — I10 Essential (primary) hypertension: Secondary | ICD-10-CM

## 2017-08-17 DIAGNOSIS — H43811 Vitreous degeneration, right eye: Secondary | ICD-10-CM | POA: Diagnosis not present

## 2017-08-17 DIAGNOSIS — H25013 Cortical age-related cataract, bilateral: Secondary | ICD-10-CM | POA: Diagnosis not present

## 2017-08-27 ENCOUNTER — Encounter: Payer: Self-pay | Admitting: Internal Medicine

## 2017-08-27 ENCOUNTER — Ambulatory Visit (INDEPENDENT_AMBULATORY_CARE_PROVIDER_SITE_OTHER): Payer: Medicare HMO | Admitting: Internal Medicine

## 2017-08-27 ENCOUNTER — Other Ambulatory Visit: Payer: Self-pay

## 2017-08-27 VITALS — BP 116/65 | HR 68 | Temp 98.9°F | Ht 67.5 in | Wt 357.5 lb

## 2017-08-27 DIAGNOSIS — Z0000 Encounter for general adult medical examination without abnormal findings: Secondary | ICD-10-CM

## 2017-08-27 DIAGNOSIS — R05 Cough: Secondary | ICD-10-CM | POA: Diagnosis not present

## 2017-08-27 DIAGNOSIS — Z79899 Other long term (current) drug therapy: Secondary | ICD-10-CM | POA: Diagnosis not present

## 2017-08-27 DIAGNOSIS — Z6841 Body Mass Index (BMI) 40.0 and over, adult: Secondary | ICD-10-CM

## 2017-08-27 DIAGNOSIS — G4733 Obstructive sleep apnea (adult) (pediatric): Secondary | ICD-10-CM | POA: Diagnosis not present

## 2017-08-27 DIAGNOSIS — F329 Major depressive disorder, single episode, unspecified: Secondary | ICD-10-CM

## 2017-08-27 DIAGNOSIS — N3941 Urge incontinence: Secondary | ICD-10-CM

## 2017-08-27 DIAGNOSIS — Z9989 Dependence on other enabling machines and devices: Secondary | ICD-10-CM

## 2017-08-27 DIAGNOSIS — R351 Nocturia: Secondary | ICD-10-CM

## 2017-08-27 DIAGNOSIS — K000 Anodontia: Secondary | ICD-10-CM

## 2017-08-27 DIAGNOSIS — I10 Essential (primary) hypertension: Secondary | ICD-10-CM

## 2017-08-27 DIAGNOSIS — F339 Major depressive disorder, recurrent, unspecified: Secondary | ICD-10-CM | POA: Diagnosis not present

## 2017-08-27 DIAGNOSIS — Z8639 Personal history of other endocrine, nutritional and metabolic disease: Secondary | ICD-10-CM | POA: Diagnosis not present

## 2017-08-27 DIAGNOSIS — K08109 Complete loss of teeth, unspecified cause, unspecified class: Secondary | ICD-10-CM

## 2017-08-27 DIAGNOSIS — Z972 Presence of dental prosthetic device (complete) (partial): Secondary | ICD-10-CM

## 2017-08-27 DIAGNOSIS — Z1239 Encounter for other screening for malignant neoplasm of breast: Secondary | ICD-10-CM

## 2017-08-27 DIAGNOSIS — Z87891 Personal history of nicotine dependence: Secondary | ICD-10-CM

## 2017-08-27 LAB — POCT GLYCOSYLATED HEMOGLOBIN (HGB A1C): HEMOGLOBIN A1C: 5.6 % (ref 4.0–5.6)

## 2017-08-27 LAB — GLUCOSE, CAPILLARY: Glucose-Capillary: 106 mg/dL — ABNORMAL HIGH (ref 70–99)

## 2017-08-27 NOTE — Assessment & Plan Note (Signed)
This problem is chronic and uncontrolled.  With the 11 pound weight gain, her symptoms of gotten a little bit worse.  She has not responded to them they have been drawn in the past.  She is now having to wear adult underwear all the time.  We discussed pessary which might be an option if she had a component of stress incontinence.  I have always thought her symptoms are urge incontinence but would be willing to have her seen by gynecology to discuss any options they would have.  I am mailing her the information on pressors and she will let me know if she wants an appointment with gynecology.  PLAN : pt to think about gyn appt

## 2017-08-27 NOTE — Progress Notes (Signed)
   Subjective:    Patient ID: [name], female    DOB: 02/11/1946, 72 y.o.   MRN: [id number]  HPI  [name] is here for HTN F/U. Please see the A&P for the status of the pt's chronic medical problems.  ROS : per ROS section and in problem oriented charting. All other systems are negative.  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  She is currently pain 50% of her monthly income to rent.  Review of Systems  Constitutional: Positive for unexpected weight change.  HENT: Negative for rhinorrhea and sneezing.   Eyes: Negative for itching.  Respiratory: Positive for cough.   Genitourinary:       Urinary incontinence Nocturia       Objective:   Physical Exam  Constitutional: She appears well-developed and well-nourished. No distress.  HENT:  Head: Normocephalic and atraumatic.  Right Ear: External ear normal.  Left Ear: External ear normal.  Nose: Nose normal.  Eyes: Conjunctivae and EOM are normal. Right eye exhibits no discharge. Left eye exhibits no discharge. No scleral icterus.  Cardiovascular: Normal rate, regular rhythm and normal heart sounds.  No murmur heard. Pulmonary/Chest: Effort normal and breath sounds normal. She has no wheezes.  Neurological: She is alert.  Skin: She is not diaphoretic.  Psychiatric: She has a normal mood and affect. Her behavior is normal. Judgment and thought content normal.       Assessment & Plan:

## 2017-08-27 NOTE — Assessment & Plan Note (Signed)
This problem is new.  She complains of a couple of months of a intermittent mild cough.  She states this is not likely cough she had due to lisinopril.  She is on Benicar now.  She does have a 20-pack-year history of tobacco use and quit about 30 years ago.  I discussed with her that if her cough remains in a month or 2, that she needs to let me know because I would want to get a chest x-ray and consider changing her ARB to a different medication.  PLAN : follow cough

## 2017-08-27 NOTE — Assessment & Plan Note (Signed)
This problem is chronic and stable.  She is on amlodipine 10 mg, Benicar 20 mg, and HCTZ 25 mg.  She is having no side effects to these medications.  Her weight is at goal today.  PLAN:  Cont current meds   BP Readings from Last 3 Encounters:  08/27/17 116/65  03/26/17 132/81  12/18/16 (!) 130/58

## 2017-08-27 NOTE — Assessment & Plan Note (Signed)
This problem is chronic and improved.  She sees a Therapist, sportspsychiatrist, counselor, peers support, and a support group.  Her Wellbutrin was recently changed to 1/2 pill twice daily instead of 1 pill once a day.  She states this is because she was told it was not being absorbed.  With the new dosing schedule, she feels she is doing much better.  She is back to reading her newspaper daily and taking care of her home.  Her other medications include Trintellix 20 mg, Wellbutrin 50 mg twice daily, and Xanax at bedtime.  PLAN : Cont to follow mental health recs

## 2017-08-27 NOTE — Assessment & Plan Note (Signed)
This problem is chronic and improved.  She has her upper dentures and has had them for some time.  They fit well.  She got her lower dentures but they did not fit.  They had to be remade.  She also got implants in the lower jaw so that they would fit well and now snap in place.  She does not wear them all the time.  PLAN : Follow

## 2017-08-27 NOTE — Assessment & Plan Note (Signed)
This problem is chronic and uncontrolled.  She did gain back 11 pounds during the time her Wellbutrin was not at the proper dosing.  However, her weight still is overall down and she feels that now that the Wellbutrin is at the proper dosing, she will be able to continue to lose weight.  She is no longer going to the weight loss clinic.  When she binges, she eats the wrong food.  With the 11 pound gain, she felt her urinary symptoms worsened and that she gets spasms around her middle.  She knows that losing weight will improve all of her health issues.  PLAN : Cont to support her in her efforts.

## 2017-08-27 NOTE — Assessment & Plan Note (Signed)
This problem is chronic and uncontrolled.  She does use CPAP but not as much as she should.  Continue to support patient and CPAP usage.  PLAN : CPAP

## 2017-08-27 NOTE — Patient Instructions (Signed)
1. See me in 6 months 2. I will mail you your test results 3.  I am ordering a mammmogram

## 2017-09-29 ENCOUNTER — Other Ambulatory Visit: Payer: Self-pay | Admitting: Internal Medicine

## 2017-09-29 DIAGNOSIS — Z1231 Encounter for screening mammogram for malignant neoplasm of breast: Secondary | ICD-10-CM

## 2017-10-22 ENCOUNTER — Ambulatory Visit: Payer: Self-pay

## 2017-10-23 ENCOUNTER — Other Ambulatory Visit: Payer: Self-pay | Admitting: Internal Medicine

## 2017-10-23 NOTE — Telephone Encounter (Signed)
Dec or Jan PCP appt pls

## 2017-11-25 ENCOUNTER — Other Ambulatory Visit: Payer: Self-pay | Admitting: Internal Medicine

## 2017-11-25 DIAGNOSIS — I10 Essential (primary) hypertension: Secondary | ICD-10-CM

## 2017-12-16 ENCOUNTER — Other Ambulatory Visit: Payer: Self-pay | Admitting: Internal Medicine

## 2017-12-25 ENCOUNTER — Other Ambulatory Visit: Payer: Self-pay | Admitting: Internal Medicine

## 2017-12-25 DIAGNOSIS — I10 Essential (primary) hypertension: Secondary | ICD-10-CM

## 2018-01-29 ENCOUNTER — Other Ambulatory Visit: Payer: Self-pay | Admitting: Internal Medicine

## 2018-02-04 ENCOUNTER — Encounter: Payer: Self-pay | Admitting: Internal Medicine

## 2018-02-04 ENCOUNTER — Ambulatory Visit (INDEPENDENT_AMBULATORY_CARE_PROVIDER_SITE_OTHER): Payer: Medicare HMO | Admitting: Internal Medicine

## 2018-02-04 ENCOUNTER — Ambulatory Visit (HOSPITAL_COMMUNITY)
Admission: RE | Admit: 2018-02-04 | Discharge: 2018-02-04 | Disposition: A | Discharge status: Home / Self Care | Payer: Medicare HMO | Source: Ambulatory Visit | Attending: Internal Medicine | Admitting: Internal Medicine

## 2018-02-04 ENCOUNTER — Other Ambulatory Visit: Payer: Self-pay

## 2018-02-04 VITALS — BP 127/68 | HR 85 | Temp 98.2°F | Wt 350.9 lb

## 2018-02-04 DIAGNOSIS — Z598 Other problems related to housing and economic circumstances: Secondary | ICD-10-CM

## 2018-02-04 DIAGNOSIS — Z23 Encounter for immunization: Secondary | ICD-10-CM | POA: Diagnosis not present

## 2018-02-04 DIAGNOSIS — R059 Cough, unspecified: Secondary | ICD-10-CM | POA: Insufficient documentation

## 2018-02-04 DIAGNOSIS — N3941 Urge incontinence: Secondary | ICD-10-CM | POA: Diagnosis not present

## 2018-02-04 DIAGNOSIS — R05 Cough: Secondary | ICD-10-CM | POA: Insufficient documentation

## 2018-02-04 DIAGNOSIS — Z6841 Body Mass Index (BMI) 40.0 and over, adult: Secondary | ICD-10-CM

## 2018-02-04 DIAGNOSIS — R053 Chronic cough: Secondary | ICD-10-CM

## 2018-02-04 DIAGNOSIS — R6889 Other general symptoms and signs: Secondary | ICD-10-CM | POA: Diagnosis not present

## 2018-02-04 DIAGNOSIS — Z79899 Other long term (current) drug therapy: Secondary | ICD-10-CM

## 2018-02-04 DIAGNOSIS — I10 Essential (primary) hypertension: Secondary | ICD-10-CM | POA: Diagnosis not present

## 2018-02-04 DIAGNOSIS — K219 Gastro-esophageal reflux disease without esophagitis: Secondary | ICD-10-CM

## 2018-02-04 DIAGNOSIS — I129 Hypertensive chronic kidney disease with stage 1 through stage 4 chronic kidney disease, or unspecified chronic kidney disease: Secondary | ICD-10-CM | POA: Diagnosis not present

## 2018-02-04 DIAGNOSIS — N182 Chronic kidney disease, stage 2 (mild): Secondary | ICD-10-CM

## 2018-02-04 DIAGNOSIS — N3946 Mixed incontinence: Secondary | ICD-10-CM

## 2018-02-04 DIAGNOSIS — G4733 Obstructive sleep apnea (adult) (pediatric): Secondary | ICD-10-CM

## 2018-02-04 DIAGNOSIS — F329 Major depressive disorder, single episode, unspecified: Secondary | ICD-10-CM

## 2018-02-04 DIAGNOSIS — Z1211 Encounter for screening for malignant neoplasm of colon: Secondary | ICD-10-CM

## 2018-02-04 DIAGNOSIS — F339 Major depressive disorder, recurrent, unspecified: Secondary | ICD-10-CM | POA: Diagnosis not present

## 2018-02-04 NOTE — Assessment & Plan Note (Signed)
This problem is chronic and stable.  I brought up her creatinine graft and demonstrated that her creatinine had not worsen in the past 10 years.  I reassured her that if it does not worsen that she will never had any issues with her kidneys.  She was reassured by this.  She had no macro albuminuria on her most recent UA.  I have not gotten a microalbumin on her.  She is on an ARB.  PLAN : follow

## 2018-02-04 NOTE — Patient Instructions (Signed)
1. Se me in 3-6 months 2. I am referring you to gynocology and GI 3. I am getting a chest Xray 4. I will mail you your test results

## 2018-02-04 NOTE — Assessment & Plan Note (Signed)
This problem is chronic and uncontrolled.  She has not been able to speak to her counselor for 3 weeks and she thinks that along with the holidays are making her mood worse.  She was alone for Thanksgiving.  Plus she had a difficulty recently with her housing administration.  She has left a message with her counselor to see if she can get in to speak with her.  She went to her depression support group last night.  She does not have any active suicidal ideation but does have some passive SI.  She knows that she will not act because she is a religious individual and believes and has been in hell and does not want to go to hell if she were to commit suicide.  PLAN : Supportive listening today Continue to follow and encourage

## 2018-02-04 NOTE — Assessment & Plan Note (Signed)
This problem is chronic and worsened.  She is not getting GERD symptoms on average if he eats anything she should not like a line because I or cheese crackers.  I offered a PPI but she is not interested at this time.  PLAN : follow

## 2018-02-04 NOTE — Assessment & Plan Note (Signed)
This problem is chronic and stable.  She is emotionally eating well as her depression is not well controlled.  She has a Veterinary surgeoncounselor and a depression support group along with a psychiatrist he does medication management.  PLAN : Follow-up

## 2018-02-04 NOTE — Assessment & Plan Note (Signed)
This problem is chronic and uncontrolled.  She has severe OSA diagnosed in 2012.  She was not able to use the nasal pillows because she is a mouth breather and now has a mask.  She wakes up at night every 1-2 hours to urinate and this makes it difficult to continually take off and put on her CPAP.  We discussed oral devices but she reminded me that she has no teeth so I have to do some research to see if there are any off for individuals without teeth.  PLAN : Research options for oral devices for individuals without teeth

## 2018-02-04 NOTE — Progress Notes (Signed)
   Subjective:    Patient ID: [name], female    DOB: 1945-10-13, 72 y.o.   MRN: [id number]  HPI  [name] is here for urinary incontinence. Please see the A&P for the status of the pt's chronic medical problems.  ROS : per ROS section and in problem oriented charting. All other systems are negative.  PMHx, Soc hx, and / or Fam hx : She has pain over 50% of her income to rent and her rent recently increased.  She got a citation from the housing unit for housekeeping issues but that has been successfully resolved.  She sees her counselor every Tuesday but has not been able to go for the past 3 weeks.  She goes to her depression support group on Wednesdays.  She has a Medicare advantage plan.  She does not have Medicaid but Medicaid does not pay for her monthly Medicare premium.  Review of Systems  HENT: Negative for rhinorrhea and sneezing.   Respiratory: Positive for cough.   Gastrointestinal:       + heartburn when "eats the wrong thing"  Genitourinary:       She has urinary incontinence, mixed.  She has urge incontinence and leakage without any urge or stress.  She does not seem to have any stress incontinence.  Psychiatric/Behavioral: Positive for dysphoric mood.       Objective:   Physical Exam  Constitutional: She appears well-developed and well-nourished. No distress.  HENT:  Head: Normocephalic and atraumatic.  Right Ear: External ear normal.  Left Ear: External ear normal.  Nose: Nose normal.  Eyes: Conjunctivae and EOM are normal. Right eye exhibits no discharge. Left eye exhibits no discharge. No scleral icterus.  Cardiovascular: Normal rate, regular rhythm and normal heart sounds.  No murmur heard. Pulmonary/Chest: Effort normal and breath sounds normal. No respiratory distress.  Neurological: She is alert.  Skin: Skin is warm and dry. No rash noted. She is not diaphoretic. No erythema. No pallor.  Psychiatric: Her speech is normal and behavior is  normal. Judgment normal. Her mood appears not anxious. Her affect is not angry, not blunt, not labile and not inappropriate. She is not actively hallucinating. Cognition and memory are normal. She exhibits a depressed mood. She expresses no suicidal ideation. She expresses no suicidal plans. She is attentive.  Tearful, good eye contact     Assessment & Plan:

## 2018-02-04 NOTE — Assessment & Plan Note (Signed)
This problem is not new.  She continues to cough as she had told me at her last appointment.  It is at least daily but is random times throughout the day and she cannot identify a trigger.  She does describe GERD symptoms and is on an ARB.  Both of those could be a potential etiology.  Additionally, she would be at risk for asthma but had not been refusing on examination today.  However I discussed that since this is a chronic cough, I need to start with a chest x-ray.  I have independently viewed the images which are a PA and lateral, good quality, there was loss of the left heart border but otherwise no abnormalities that would cause her to have a chronic cough.  I will await the official reading when it is available.  PLAN : Await official chest x-ray reading Rediscuss starting a PPI or stopping her ARB at her next appointment If cough does not resolve with a PPI and stopping her ARB, consider PFTs and a methacholine challenge

## 2018-02-04 NOTE — Assessment & Plan Note (Addendum)
This problem is chronic and uncontrolled.  It has gotten so severe that she is ready to seek additional treatment.  Per her history, she has urge incontinence but not stress incontinence.  Additionally she has leakage of urine without any sensation she needs to urinate.  She has failed mirabegron and has stopped it.  She was also on Ditropan previously and could not tolerate it.  We discussed options and elected to refer her to gynecology to see if there are any other treatment options as this is causing her significant distress and issues with her housing authority.  Her most recent UA in 2018 was a clean-catch urine but did show pyuria with positive nitrites.  That was done in an inpatient setting and she was treated with antibiotics.  At my follow-up appointment, she denied dysuria prior to that UA.  I discussed that she likely had asymptomatic bacteriuria and not actually a UTI.  PLAN : Referral to gynecology

## 2018-02-04 NOTE — Assessment & Plan Note (Signed)
This problem is chronic and controlled.  She is on Norvasc 10, HCTZ 25, and Benicar 20.  We had stopped her HCTZ in 2017 to see if that was worsening her urinary incontinence that she did not get any benefit being off the medicine.  Additionally, she has been out of it for a few days and her urinary issues did not improve.  She is having a cough that is now chronic.  Benicar is unlikely but could cause a chronic cough.  She does not want to stop her Benicar at this time that we will consider if her cough continues.  I am getting a BMP today.  PLAN:  Cont current meds    BP Readings from Last 3 Encounters:  02/04/18 127/68  08/27/17 116/65  03/26/17 132/81

## 2018-02-05 ENCOUNTER — Encounter: Payer: Self-pay | Admitting: Internal Medicine

## 2018-02-05 LAB — BMP8+ANION GAP
Anion Gap: 19 mmol/L — ABNORMAL HIGH (ref 10.0–18.0)
BUN/Creatinine Ratio: 15 (ref 12–28)
BUN: 18 mg/dL (ref 8–27)
CO2: 19 mmol/L — ABNORMAL LOW (ref 20–29)
CREATININE: 1.21 mg/dL — AB (ref 0.57–1.00)
Calcium: 9.2 mg/dL (ref 8.7–10.3)
Chloride: 99 mmol/L (ref 96–106)
GFR calc Af Amer: 52 mL/min/{1.73_m2} — ABNORMAL LOW (ref >59)
GFR, EST NON AFRICAN AMERICAN: 45 mL/min/{1.73_m2} — AB (ref >59)
Glucose: 97 mg/dL (ref 65–99)
Potassium: 4.4 mmol/L (ref 3.5–5.2)
Sodium: 137 mmol/L (ref 134–144)

## 2018-02-05 LAB — VITAMIN B12: Vitamin B-12: 1531 pg/mL — ABNORMAL HIGH (ref 232–1245)

## 2018-02-18 ENCOUNTER — Other Ambulatory Visit: Payer: Self-pay | Admitting: Gastroenterology

## 2018-02-18 DIAGNOSIS — Z7901 Long term (current) use of anticoagulants: Secondary | ICD-10-CM | POA: Diagnosis not present

## 2018-02-18 DIAGNOSIS — Z86718 Personal history of other venous thrombosis and embolism: Secondary | ICD-10-CM | POA: Diagnosis not present

## 2018-02-18 DIAGNOSIS — K219 Gastro-esophageal reflux disease without esophagitis: Secondary | ICD-10-CM | POA: Diagnosis not present

## 2018-02-18 DIAGNOSIS — Z800 Family history of malignant neoplasm of digestive organs: Secondary | ICD-10-CM | POA: Diagnosis not present

## 2018-02-18 DIAGNOSIS — Z1211 Encounter for screening for malignant neoplasm of colon: Secondary | ICD-10-CM | POA: Diagnosis not present

## 2018-03-04 ENCOUNTER — Other Ambulatory Visit: Payer: Self-pay | Admitting: Internal Medicine

## 2018-03-05 ENCOUNTER — Encounter: Payer: Self-pay | Admitting: Internal Medicine

## 2018-03-05 ENCOUNTER — Other Ambulatory Visit: Payer: Self-pay

## 2018-03-05 ENCOUNTER — Ambulatory Visit (INDEPENDENT_AMBULATORY_CARE_PROVIDER_SITE_OTHER): Payer: Medicare HMO | Admitting: Internal Medicine

## 2018-03-05 DIAGNOSIS — T148XXA Other injury of unspecified body region, initial encounter: Secondary | ICD-10-CM | POA: Diagnosis not present

## 2018-03-05 DIAGNOSIS — W1849XA Other slipping, tripping and stumbling without falling, initial encounter: Secondary | ICD-10-CM | POA: Diagnosis not present

## 2018-03-05 DIAGNOSIS — Z79899 Other long term (current) drug therapy: Secondary | ICD-10-CM | POA: Diagnosis not present

## 2018-03-05 DIAGNOSIS — I10 Essential (primary) hypertension: Secondary | ICD-10-CM

## 2018-03-05 MED ORDER — CYCLOBENZAPRINE HCL 5 MG PO TABS: 5.0000 mg | ORAL_TABLET | Freq: Every day | ORAL | 0 refills | Status: DC

## 2018-03-05 MED ORDER — ACETAMINOPHEN 500 MG PO TABS: 650.0000 mg | ORAL_TABLET | Freq: Four times a day (QID) | ORAL | 0 refills | Status: AC | PRN

## 2018-03-05 NOTE — Telephone Encounter (Signed)
cyclobenzaprine (FLEXERIL) 5 MG tablet, refill request @  [id number] - [place], [place] - [address] AT [facility] [address] [phone] (Phone) [phone] (Fax)

## 2018-03-05 NOTE — Telephone Encounter (Signed)
30 pills were Rx July 2018 no RF by another provider. I am not cont med

## 2018-03-05 NOTE — Telephone Encounter (Signed)
Called pt, states she has pain in her R side and had a few flexeril left from 2018 and they helped the discomfort. Informed her she would need evaluation, she made appt for today

## 2018-03-05 NOTE — Assessment & Plan Note (Signed)
Patient presented with 2 weeks Hx of pain at her right buttock area after she tripped over her dog and had a sudden movement to prevent falling on him.  Pain does not radiate. No numbness or tingling and no weakness.  Has localized tenderness on her right buttock with no evidence or radiculopathy. No tenderness on spine or hip. Sensory and muscular tone are intact. ROM is normal. Likely muscle spasm/strain. -Will give low dose of Flexeril for 2 days (Avoiding high dose due to having colonoscopy in 4 days and for possible side effect with anesthesia) -Tylenol 500 mg q6-8 h PRN x 20

## 2018-03-05 NOTE — Assessment & Plan Note (Signed)
BP well controlled today at 113/86. -Continue current regimen

## 2018-03-05 NOTE — Progress Notes (Signed)
CC: Buttock pain  HPI:  [name] is a 73 y.o. with PMHx as documented below, presented due to pain at right side of her buttock. It started 2 weeks ago after she had a sudden movement when she tripped over her dog and tried not to fall on him. The generalized pain on her buttock improved over past week but she now has localized pain on middle of her right buttock with certain movements.The pain does not radiate to her thigh. She can stand and walk. But sitting from standing position and vice versa produce the pain. No pain at night. She denies any numbness or tingling. No new urinary or fecal incontinency. No weakness.  No fever or chills. No back pain, No flank or abdominal pain. She toke 2 left over Flexeril last night and this morning with some relief.    Past Medical History:  Diagnosis Date  . Arthritis    osteoarthritis , kness.  . Chronic kidney disease    Renal insuffucinecy, cr-1.33 in 10/2005.  [name]. Depression    followed by Dr. Evelene CroonKaur  . Diverticulosis of colon   . Fibrocystic breast disease   . History of hepatitis B   . History of pulmonary embolism 01/2002  . Hyperlipidemia   . Hypertension   . Left shoulder pain    2/2 fall  . Neck nodule    not erythematous, movable like cyst located at the base of posterior neck on the left side(not painful), will need follow up for   . Obesity   . Obstructive sleep apnea    refuses CPAP.  [name]. Pulmonary nodule    Stable on repeat imaging. LLL nodule 5 mm    Family Hx: Parkinson disease       Brother  Social Hx: No current smoking No EtoH use   Review of Systems: Review of Systems  Constitutional: Negative for chills, fever and malaise/fatigue.  Gastrointestinal: Negative for abdominal pain, diarrhea, nausea and vomiting.  Genitourinary: Negative for dysuria, frequency, hematuria and urgency.  Skin: Negative for rash.  Neurological: Negative for dizziness, sensory change, weakness and headaches.    Physical  Exam:  Vitals:   03/05/18 1455  BP: 113/86  Pulse: 78  Temp: (!) 97.5 F (36.4 C)  TempSrc: Oral  SpO2: 96%  Weight: (!) 350 lb 8 oz (159 kg)  Height: 5' 7.5" (1.715 m)   Physical Exam Constitutional:      General: She is not in acute distress.    Appearance: Normal appearance. She is obese. She is not ill-appearing.  HENT:     Head: Normocephalic and atraumatic.  Eyes:     Extraocular Movements: Extraocular movements intact.  Cardiovascular:     Rate and Rhythm: Normal rate and regular rhythm.     Pulses: Normal pulses.     Heart sounds: No murmur.  Pulmonary:     Effort: Pulmonary effort is normal.     Breath sounds: Normal breath sounds. No wheezing or rales.  Abdominal:     General: Bowel sounds are normal.     Palpations: Abdomen is soft.     Tenderness: There is no abdominal tenderness.  Musculoskeletal: Normal range of motion.        General: Tenderness present.     Right lower leg: No edema.     Left lower leg: No edema.  Skin:    General: Skin is warm and dry.  Neurological:     General: No focal deficit present.  Mental Status: She is alert and oriented to person, place, and time.     Sensory: No sensory deficit.     Assessment & Plan:   See Encounters Tab for problem based charting.  Patient discussed with Dr. Rogelia BogaButcher

## 2018-03-05 NOTE — Patient Instructions (Signed)
Thank you for allowing [place] to provide your care today. You came in due to muscular pain. It seems to be muscle spasm. I have ordered 2 Flexeril. (We hesitate to give you more due to upcoming colonoscopy and possible interfering with anesthesia.). You can also take tylenol as needed 3-4 times a day for few days for pain.  Should you have any questions or concerns please call the internal medicine clinic at [phone].

## 2018-03-08 ENCOUNTER — Other Ambulatory Visit: Payer: Self-pay | Admitting: Internal Medicine

## 2018-03-08 ENCOUNTER — Telehealth: Payer: Self-pay

## 2018-03-08 MED ORDER — CYCLOBENZAPRINE HCL 5 MG PO TABS: 5.0000 mg | ORAL_TABLET | Freq: Every day | ORAL | 0 refills | Status: DC | PRN

## 2018-03-08 NOTE — Telephone Encounter (Signed)
Received TC from pt,states she was seen in [facility] last week for a muscle strain.  While in [facility] she informed MD she was having a colonoscopy on 03/09/18.   1.Pt states she was told by [facility] MD to continue Xarelto and stop 1 day before procedure.  Pt states she spoke with pre-admit nurse today and was told GI wanted her to stop Xarelto 3 days before procedure so Colonoscopy for tomorrow was cancelled. Pt states she has a consult with GI/Dr. Randa Evens on 03/15/17, still wants Dr. Donnelly Stager opinion re Xarelto/when to stop.  2. Was given #2 cyclobenzaprine 5mg  tabs last week.  Pt requesting more since procedure was now cancelled, states she had a TID RX in past and would like that again. Will route to pcp. Thank you, SChaplin, RN,BSN

## 2018-03-08 NOTE — Telephone Encounter (Signed)
Requesting to speak with a nurse about meds. Please call pt back.  

## 2018-03-08 NOTE — Telephone Encounter (Signed)
I completed the pro-colonscopy form last Friday and gave to [facility]. I rec stopping xarelto AM of procedure and resuming subsequent day.   She needs to understand that muscle relaxers are not long term meds. She had an acute event which will reslove and she should not use it TID - only PRN sparingly.

## 2018-03-09 ENCOUNTER — Ambulatory Visit (HOSPITAL_COMMUNITY): Admit: 2018-03-09 | Payer: Medicare HMO | Admitting: Gastroenterology

## 2018-03-09 ENCOUNTER — Encounter (HOSPITAL_COMMUNITY): Payer: Self-pay

## 2018-03-09 SURGERY — COLONOSCOPY WITH PROPOFOL
Anesthesia: Monitor Anesthesia Care

## 2018-03-09 NOTE — Progress Notes (Signed)
Internal Medicine Clinic Attending  Case discussed with Dr. Maryla Morrow at the time of the visit.  We reviewed the resident's history and exam and pertinent patient test results.  I agree with the assessment, diagnosis, and plan of care documented in the resident's note.  Pain should be self limiting since 2/2 fall. Agree with limiting sedating meds 2/2 upcoming procedure, age, and co-morbidites.

## 2018-03-10 NOTE — Telephone Encounter (Signed)
Attempted to call pt, no answer, no vmail 

## 2018-03-15 ENCOUNTER — Other Ambulatory Visit: Payer: Self-pay | Admitting: Gastroenterology

## 2018-03-15 DIAGNOSIS — Z86711 Personal history of pulmonary embolism: Secondary | ICD-10-CM | POA: Diagnosis not present

## 2018-03-15 DIAGNOSIS — G4733 Obstructive sleep apnea (adult) (pediatric): Secondary | ICD-10-CM | POA: Diagnosis not present

## 2018-03-15 DIAGNOSIS — Z800 Family history of malignant neoplasm of digestive organs: Secondary | ICD-10-CM | POA: Diagnosis not present

## 2018-03-15 DIAGNOSIS — Z7901 Long term (current) use of anticoagulants: Secondary | ICD-10-CM | POA: Diagnosis not present

## 2018-03-15 DIAGNOSIS — K921 Melena: Secondary | ICD-10-CM | POA: Diagnosis not present

## 2018-03-16 ENCOUNTER — Telehealth: Payer: Self-pay | Admitting: *Deleted

## 2018-03-16 NOTE — Telephone Encounter (Addendum)
Information was sent through CoverMyMeds for [place] for Cyclobenzaprine .  Awaiting decision within  24 to 72 hours.  Contact number [phone]. Angelina Ok, RN 03/16/2018 3:57 PM.  Approved for Cyclobenzaprine 03/16/2018 through 03/03/2019.  Angelina Ok, RN 03/16/2018.

## 2018-03-18 ENCOUNTER — Ambulatory Visit
Admission: RE | Admit: 2018-03-18 | Discharge: 2018-03-18 | Disposition: A | Discharge status: Home / Self Care | Payer: Medicare HMO | Source: Ambulatory Visit | Attending: Gastroenterology | Admitting: Gastroenterology

## 2018-03-18 ENCOUNTER — Other Ambulatory Visit: Payer: Self-pay | Admitting: Gastroenterology

## 2018-03-18 DIAGNOSIS — Z800 Family history of malignant neoplasm of digestive organs: Secondary | ICD-10-CM

## 2018-03-18 DIAGNOSIS — K5730 Diverticulosis of large intestine without perforation or abscess without bleeding: Secondary | ICD-10-CM | POA: Diagnosis not present

## 2018-03-19 ENCOUNTER — Other Ambulatory Visit: Payer: Self-pay

## 2018-04-07 ENCOUNTER — Ambulatory Visit
Admission: RE | Admit: 2018-04-07 | Discharge: 2018-04-07 | Disposition: A | Discharge status: Home / Self Care | Payer: Medicare HMO | Source: Ambulatory Visit | Attending: Internal Medicine | Admitting: Internal Medicine

## 2018-04-07 DIAGNOSIS — Z1231 Encounter for screening mammogram for malignant neoplasm of breast: Secondary | ICD-10-CM | POA: Diagnosis not present

## 2018-04-13 ENCOUNTER — Other Ambulatory Visit: Payer: Self-pay | Admitting: Gastroenterology

## 2018-04-28 ENCOUNTER — Encounter (HOSPITAL_COMMUNITY): Payer: Self-pay | Admitting: *Deleted

## 2018-04-29 ENCOUNTER — Other Ambulatory Visit: Payer: Self-pay

## 2018-04-29 ENCOUNTER — Encounter (HOSPITAL_COMMUNITY): Payer: Self-pay | Admitting: *Deleted

## 2018-04-29 ENCOUNTER — Encounter (HOSPITAL_COMMUNITY): Payer: Self-pay | Admitting: Certified Registered"

## 2018-04-29 ENCOUNTER — Encounter (HOSPITAL_COMMUNITY)
Admission: RE | Disposition: A | Discharge status: Home / Self Care | Payer: Self-pay | Source: Home / Self Care | Attending: Gastroenterology

## 2018-04-29 ENCOUNTER — Ambulatory Visit (HOSPITAL_COMMUNITY)
Admission: RE | Admit: 2018-04-29 | Discharge: 2018-04-29 | Disposition: A | Discharge status: Home / Self Care | Payer: Medicare HMO | Attending: Gastroenterology | Admitting: Gastroenterology

## 2018-04-29 DIAGNOSIS — K5730 Diverticulosis of large intestine without perforation or abscess without bleeding: Secondary | ICD-10-CM | POA: Diagnosis not present

## 2018-04-29 DIAGNOSIS — Z79899 Other long term (current) drug therapy: Secondary | ICD-10-CM | POA: Insufficient documentation

## 2018-04-29 DIAGNOSIS — E7800 Pure hypercholesterolemia, unspecified: Secondary | ICD-10-CM | POA: Insufficient documentation

## 2018-04-29 DIAGNOSIS — Z86711 Personal history of pulmonary embolism: Secondary | ICD-10-CM | POA: Diagnosis not present

## 2018-04-29 DIAGNOSIS — I129 Hypertensive chronic kidney disease with stage 1 through stage 4 chronic kidney disease, or unspecified chronic kidney disease: Secondary | ICD-10-CM | POA: Insufficient documentation

## 2018-04-29 DIAGNOSIS — Z7901 Long term (current) use of anticoagulants: Secondary | ICD-10-CM | POA: Insufficient documentation

## 2018-04-29 DIAGNOSIS — K922 Gastrointestinal hemorrhage, unspecified: Secondary | ICD-10-CM | POA: Insufficient documentation

## 2018-04-29 DIAGNOSIS — N182 Chronic kidney disease, stage 2 (mild): Secondary | ICD-10-CM | POA: Insufficient documentation

## 2018-04-29 DIAGNOSIS — F329 Major depressive disorder, single episode, unspecified: Secondary | ICD-10-CM | POA: Diagnosis not present

## 2018-04-29 DIAGNOSIS — Z800 Family history of malignant neoplasm of digestive organs: Secondary | ICD-10-CM | POA: Diagnosis not present

## 2018-04-29 DIAGNOSIS — K640 First degree hemorrhoids: Secondary | ICD-10-CM | POA: Diagnosis not present

## 2018-04-29 DIAGNOSIS — G4730 Sleep apnea, unspecified: Secondary | ICD-10-CM | POA: Insufficient documentation

## 2018-04-29 DIAGNOSIS — R195 Other fecal abnormalities: Secondary | ICD-10-CM | POA: Diagnosis not present

## 2018-04-29 HISTORY — PX: FLEXIBLE SIGMOIDOSCOPY: SHX5431

## 2018-04-29 SURGERY — SIGMOIDOSCOPY, FLEXIBLE
Anesthesia: Moderate Sedation

## 2018-04-29 MED ORDER — SODIUM CHLORIDE 0.9 % IV SOLN: INTRAVENOUS | Status: DC

## 2018-04-29 NOTE — Discharge Instructions (Signed)
Diverticulosis  Diverticulosis is a condition that develops when small pouches (diverticula) form in the wall of the large intestine (colon). The colon is where water is absorbed and stool is formed. The pouches form when the inside layer of the colon pushes through weak spots in the outer layers of the colon. You may have a few pouches or many of them. What are the causes? The cause of this condition is not known. What increases the risk? The following factors may make you more likely to develop this condition:  Being older than age 85. Your risk for this condition increases with age. Diverticulosis is rare among people younger than age 3. By age 69, many people have it.  Eating a low-fiber diet.  Having frequent constipation.  Being overweight.  Not getting enough exercise.  Smoking.  Taking over-the-counter pain medicines, like aspirin and ibuprofen.  Having a family history of diverticulosis. What are the signs or symptoms? In most people, there are no symptoms of this condition. If you do have symptoms, they may include:  Bloating.  Cramps in the abdomen.  Constipation or diarrhea.  Pain in the lower left side of the abdomen. How is this diagnosed? This condition is most often diagnosed during an exam for other colon problems. Because diverticulosis usually has no symptoms, it often cannot be diagnosed independently. This condition may be diagnosed by:  Using a flexible scope to examine the colon (colonoscopy).  Taking an X-ray of the colon after dye has been put into the colon (barium enema).  Doing a CT scan. How is this treated? You may not need treatment for this condition if you have never developed an infection related to diverticulosis. If you have had an infection before, treatment may include:  Eating a high-fiber diet. This may include eating more fruits, vegetables, and grains.  Taking a fiber supplement.  Taking a live bacteria supplement  (probiotic).  Taking medicine to relax your colon.  Taking antibiotic medicines. Follow these instructions at home:  Drink 6-8 glasses of water or more each day to prevent constipation.  Try not to strain when you have a bowel movement.  If you have had an infection before: ? Eat more fiber as directed by your health care provider or your diet and nutrition specialist (dietitian). ? Take a fiber supplement or probiotic, if your health care provider approves.  Take over-the-counter and prescription medicines only as told by your health care provider.  If you were prescribed an antibiotic, take it as told by your health care provider. Do not stop taking the antibiotic even if you start to feel better.  Keep all follow-up visits as told by your health care provider. This is important. Contact a health care provider if:  You have pain in your abdomen.  You have bloating.  You have cramps.  You have not had a bowel movement in 3 days. Get help right away if:  Your pain gets worse.  Your bloating becomes very bad.  You have a fever or chills, and your symptoms suddenly get worse.  You vomit.  You have bowel movements that are bloody or black.  You have bleeding from your rectum. Summary  Diverticulosis is a condition that develops when small pouches (diverticula) form in the wall of the large intestine (colon).  You may have a few pouches or many of them.  This condition is most often diagnosed during an exam for other colon problems.  If you have had an infection related to  diverticulosis, treatment may include increasing the fiber in your diet, taking supplements, or taking medicines. This information is not intended to replace advice given to you by your health care provider. Make sure you discuss any questions you have with your health care provider. Document Released: 11/15/2003 Document Revised: 01/07/2016 Document Reviewed: 01/07/2016 Elsevier Interactive Patient  Education  2019 [employer]   YOU HAD AN ENDOSCOPIC PROCEDURE TODAY: Refer to the procedure report and other information in the discharge instructions given to you for any specific questions about what was found during the examination. If this information does not answer your questions, please call [facility] office at [phone] to clarify.   YOU SHOULD EXPECT: Some feelings of bloating in the abdomen. Passage of more gas than usual. Walking can help get rid of the air that was put into your GI tract during the procedure and reduce the bloating. If you had a lower endoscopy (such as a colonoscopy or flexible sigmoidoscopy) you may notice spotting of blood in your stool or on the toilet paper. Some abdominal soreness may be present for a day or two, also.  DIET: Your first meal following the procedure should be a light meal and then it is ok to progress to your normal diet. A half-sandwich or bowl of soup is an example of a good first meal. Heavy or fried foods are harder to digest and may make you feel nauseous or bloated. Drink plenty of fluids but you should avoid alcoholic beverages for 24 hours. If you had a esophageal dilation, please see attached instructions for diet.   ACTIVITY: Your care partner should take you home directly after the procedure. You should plan to take it easy, moving slowly for the rest of the day. You can resume normal activity the day after the procedure however YOU SHOULD NOT DRIVE, use power tools, machinery or perform tasks that involve climbing or major physical exertion for 24 hours (because of the sedation medicines used during the test).   SYMPTOMS TO REPORT IMMEDIATELY: A gastroenterologist can be reached at any hour. Please call [phone]  for any of the following symptoms:   Following lower endoscopy (colonoscopy, flexible sigmoidoscopy) Excessive amounts of blood in the stool  Significant tenderness, worsening of abdominal pains  Swelling of the abdomen  that is new, acute  Fever of 100 or higher   Following upper endoscopy (EGD, EUS, ERCP, esophageal dilation) Vomiting of blood or coffee ground material  New, significant abdominal pain  New, significant chest pain or pain under the shoulder blades  Painful or persistently difficult swallowing  New shortness of breath  Black, tarry-looking or red, bloody stools  FOLLOW UP:  If any biopsies were taken you will be contacted by phone or by letter within the next 1-3 weeks. Call [phone]  if you have not heard about the biopsies in 3 weeks.  Please also call with any specific questions about appointments or follow up tests.

## 2018-04-29 NOTE — Op Note (Signed)
[facility] Patient Name: [name] Procedure Date: 04/29/2018 MRN: [id number] Attending MD: Tresea Mall Dr., MD Date of Birth: 1945-08-05 CSN: [id number] Age: 73 Admit Type: Outpatient Procedure:                Flexible Sigmoidoscopy Indications:              Gastrointestinal occult blood loss, pt on xarelto,                            had barium enema showing diverticular disease no                            other findings Providers:                Fayrene Fearing L. Trenisha Lafavor Dr., MD, Bonney Leitz, Zoila Shutter, Technician Referring MD:             Dr Irine Seal Medicines:                None Complications:            No immediate complications. Estimated Blood Loss:     Estimated blood loss: none. Procedure:                Pre-Anesthesia Assessment:                           - Prior to the procedure, a History and Physical                            was performed, and patient medications and                            allergies were reviewed. The patient's tolerance of                            previous anesthesia was also reviewed. The risks                            and benefits of the procedure and the sedation                            options and risks were discussed with the patient.                            All questions were answered, and informed consent                            was obtained. Prior Anticoagulants: The patient has                            taken Xarelto (rivaroxaban), last dose was 2 days  prior to procedure. ASA Grade Assessment: III - A                            patient with severe systemic disease. After                            reviewing the risks and benefits, the patient was                            deemed in satisfactory condition to undergo the                            procedure.                           After obtaining informed consent, the scope was                   passed under direct vision. The PCF-H190DL                            ([id number]) Olympus pediatric colonscope was                            introduced through the anus and advanced to the the                            sigmoid colon. We advanced to about 30-35 cm. The                            flexible sigmoidoscopy was accomplished without                            difficulty. The patient tolerated the procedure                            well. The quality of the bowel preparation was                            good. Was prepped with 2 enemas Scope In: Scope Out: Findings:      The perianal and digital rectal examinations were normal.      Many medium-mouthed diverticula were found in the sigmoid colon.      Non-bleeding internal hemorrhoids were found during retroflexion. The       hemorrhoids were medium-sized and Grade I (internal hemorrhoids that do       not prolapse).      There is no endoscopic evidence of mass, polyps or tumor in the rectum,       in the recto-sigmoid colon and in the sigmoid colon. Impression:               - Diverticulosis in the sigmoid colon.                           - Non-bleeding internal hemorrhoids.                           -  No specimens collected.                           - Blood found in stool Moderate Sedation:      See anesthesia note, no moderate sedation. Recommendation:           - Discharge patient to home (via wheelchair).                           - Resume previous diet.                           - Continue present medications.                           - Return to endoscopist PRN. Procedure Code(s):        --- Professional ---                           [phone], Sigmoidoscopy, flexible; diagnostic,                            including collection of specimen(s) by brushing or                            washing, when performed (separate procedure) Diagnosis Code(s):        --- Professional ---                            R19.5, Other fecal abnormalities                           K64.0, First degree hemorrhoids                           K57.30, Diverticulosis of large intestine without                            perforation or abscess without bleeding CPT copyright 2018 American Medical Association. All rights reserved. The codes documented in this report are preliminary and upon coder review may  be revised to meet current compliance requirements. Tresea Mall Dr., MD 04/29/2018 3:35:34 PM This report has been signed electronically. Number of Addenda: 0

## 2018-04-30 ENCOUNTER — Encounter (HOSPITAL_COMMUNITY): Payer: Self-pay | Admitting: Gastroenterology

## 2018-05-18 ENCOUNTER — Other Ambulatory Visit: Payer: Self-pay | Admitting: Internal Medicine

## 2018-05-18 DIAGNOSIS — N3941 Urge incontinence: Secondary | ICD-10-CM

## 2018-06-09 ENCOUNTER — Ambulatory Visit (INDEPENDENT_AMBULATORY_CARE_PROVIDER_SITE_OTHER): Payer: Medicare HMO | Admitting: Obstetrics & Gynecology

## 2018-06-09 ENCOUNTER — Encounter: Payer: Self-pay | Admitting: Obstetrics & Gynecology

## 2018-06-09 DIAGNOSIS — R32 Unspecified urinary incontinence: Secondary | ICD-10-CM | POA: Diagnosis not present

## 2018-06-09 NOTE — Progress Notes (Signed)
Pt states that she has been having urinary incontinence x 3 years. Pt states that she is morbid obese and wears pads.

## 2018-06-09 NOTE — Progress Notes (Signed)
TELEHEALTH VIRTUAL GYNECOLOGY VISIT ENCOUNTER NOTE  I connected with [name] on 06/09/18 at  3:30 PM EDT by telephone at home and verified that I am speaking with the correct person using two identifiers.   I discussed the limitations, risks, security and privacy concerns of performing an evaluation and management service by telephone and the availability of in person appointments. I also discussed with the patient that there may be a patient responsible charge related to this service. The patient expressed understanding and agreed to proceed.   History:  [name] is a 73 y.o. G1P1 SVD female being evaluated today for urinary incontinence. Pt reports that her sx have become worse. Her sx have been present for about 2 years. She reports that it was tolerable but, now it is intolerable for the past 4 months. Pt wakes up and the urine is all over. She can't control the urine at all.  Pt reports that she is morbidly obese. >380#s. Pt reports that er ian sx or overnight. During the day pt has a sensation of needed to void at time. During th eday pt can make it to void only sometimes. Pt reports no DM. She denies odor in her urine. She reports that she has chronic major depression and says that she is 'nasty.'  Pt denies prolapse. She denies any abnormal vaginal discharge, bleeding, pelvic pain or other concerns.  Pt hasn't smoked since 20 years. Very small caffeine intake        Past Medical History:  Diagnosis Date  . Arthritis    osteoarthritis , kness.  . Chronic kidney disease    Renal insuffucinecy, cr-1.33 in 10/2005.  [name] Depression    followed by Dr. Evelene Croon  . Diverticulosis of colon   . Fibrocystic breast disease   . History of hepatitis B   . History of pulmonary embolism 01/2002  . Hyperlipidemia   . Hypertension   . Left shoulder pain    2/2 fall  . Neck nodule    not erythematous, movable like cyst located at the base of posterior neck on the left side(not  painful), will need follow up for   . Obesity   . Obstructive sleep apnea    cpap   . Pulmonary nodule    Stable on repeat imaging. LLL nodule 5 mm   Past Surgical History:  Procedure Laterality Date  . CHOLECYSTECTOMY N/A 08/09/2015   Procedure: LAPAROSCOPIC CHOLECYSTECTOMY WITH INTRAOPERATIVE CHOLANGIOGRAM;  Surgeon: Harriette Bouillon, MD;  Location: [facility] OR;  Service: General;  Laterality: N/A;  . FLEXIBLE SIGMOIDOSCOPY N/A 04/29/2018   Procedure: FLEXIBLE SIGMOIDOSCOPY;  Surgeon: Carman Ching, MD;  Location: [facility] END[facility]COPY;  Service: Endoscopy;  Laterality: N/A;  . GYN surgery     s/p excison of vulvar cyst 10/18/04 by dr. [name]   The following portions of the patient's history were reviewed and updated as appropriate: allergies, current medications, past family history, past medical history, past social history, past surgical history and problem list.      Review of Systems:  Pertinent items noted in HPI and remainder of comprehensive ROS otherwise negative.  Physical Exam:   General:  Alert, oriented and cooperative.   Mental Status: Normal mood and affect perceived. Normal judgment and thought content.  Physical exam deferred due to nature of the encounter  Assessment and Plan:     Urinary incontinence- suspect- I have explained to pt that this is a chromic problem with no quick fix. I can  see her in the ofc once the pandemic is over. I have also told her that she may need referral to UroGYN but, I would need to examined her first before that referral.   Pt expressed understanding and will wait until June or after to be seen.  I discussed the assessment and treatment plan with the patient. The patient was provided an opportunity to ask questions and all were answered. The patient agreed with the plan and demonstrated an understanding of the instructions.   The patient was advised to call back or seek an in-person evaluation/go to the ED if the symptoms worsen or if the  condition fails to improve as anticipated.  I provided 14 minutes of non-face-to-face time during this encounter.   Willodean Rosenthalarolyn Harraway-Smith, MD [facility], [facility]

## 2018-07-01 ENCOUNTER — Other Ambulatory Visit: Payer: Self-pay

## 2018-07-01 NOTE — Patient Outreach (Signed)
[facility]) Care Management  07/01/2018  [name] October 19, 1945 [id number]   Nurse Call Line Referral Date: 07/01/18 Reason for Referral: incontinence issues Nurse call line recommendation: referral made to [facility] care management  Telephone call to patient regarding nurse call line referral. HIPAA verified with patient. RNCM introduced herself and explained reason for call. Patient states she is having extremely bad urinary incontinence. She states she wets through 2 pads on her bed and brief every day. She states she is unable to make it in time to the bed side commode. Patient states it has been difficulty to maintain her hygiene due to this. Patient states her primary doctor prescribed her medication at one point but the medication did not work. Patient state her primary MD referred her to the gynecologist. She reports she spoke to the gynecologist, Dr. Erin Fulling recently to discuss her incontinence concerns.  She states she was told she may need to see a urologist.  Patient states she was scheduled and appointment to see Dr. Burnice Logan on August 18, 2018.  Patient voiced concern that this scheduled appointment is to far out and she would like to see a urologist if possible.  RNCM offered to contact patients primary MD office to discuss her concerns. Patient verbalized appreciation.  Patient states any assistance with this matter would be greatly appreciated.  RNCM attempted to call patients primary MD office x 3. No answer. Phone only rang. Unable to leave voice message for return call.   PLAN; RNCM will attempt call to patients primary MD within 1 business days.  RNCM will follow up with patient within 3 business days.   [name] RN,BSN,CCM [facility] Telephonic  [phone]

## 2018-07-01 NOTE — Patient Outreach (Addendum)
[facility]) Care Management  07/01/2018  [name] [birth year] [id number]  Nurse Call Line Referral Date: 07/01/18 Reason for Referral: incontinence issues Nurse call line recommendation: referral made to [facility] care management  Attempt #1  Telephone call to patient regarding nurse call line referral. Unable to reach patient. HIPAA compliant message left with phone number and request to return call.   PLAN; RNCM will attempt 2nd telephone call to patient within 4 business days.  Outreach letter sent to patient to attempt contact  [name] RN,BSN,CCM [facility] Telephonic  [phone]

## 2018-07-02 ENCOUNTER — Other Ambulatory Visit: Payer: Self-pay

## 2018-07-02 ENCOUNTER — Telehealth: Payer: Self-pay | Admitting: *Deleted

## 2018-07-02 ENCOUNTER — Other Ambulatory Visit: Payer: Self-pay | Admitting: *Deleted

## 2018-07-02 DIAGNOSIS — N3941 Urge incontinence: Secondary | ICD-10-CM

## 2018-07-02 NOTE — Patient Outreach (Signed)
[facility]) Care Management  07/02/2018  LENELL PAOLELLA [birth year] [id number]   CSW made an initial attempt to try and contact patient today to perform the initial phone assessment, as well as assess and assist with social work needs and services, without success.  A HIPAA compliant message was left for patient on voicemail.  CSW is currently awaiting a return call.  CSW will make a second outreach attempt within the next 3-4 business days, if a return call is not received from patient in the meantime.  CSW will also mail an Outreach Letter to patient's home requesting that patient contact CSW if patient is interested in receiving social work services through CSW with Chief Executive Officer.  Danford Bad, BSW, MSW, LCSW  Licensed Restaurant manager, [employer] [facility]  Mailing [place] [address], [place], [place] [address] Physical Address-[address], [place], [place] [address] Toll Free Main # [phone] Fax # [phone] Cell # [phone]  Office # [phone] [email or web address]

## 2018-07-02 NOTE — Patient Outreach (Signed)
[facility]) Care Management  07/02/2018  ALAZAY WANDELL 01/21/1946 [id number]  Nurse Call Line referral Referral Date:07/01/18 Reason for Referral: incontinence issues Nurse call line recommendation:referral made to [facility] care management  Care coordination:  Telephone made to patients primary MD office and spoke with Rivka Barbara, Dr. Donnelly Stager nurse.  RNCM explained to nurse patient's expressed her concern with being overwhelmed with her urinary incontinence issue and the fact that she cannot see the GYN doctor until June 2020.  Informed nurse patient is inquiring if she can be referred to a urologist and possibly be seen earlier than June 2020. Rivka Barbara states she will discuss this request with Dr. Rogelia Boga.   RNCM provided [name] her contacted phone number and requested return call.    PLAN: RNCM will await return call from patients primary MD office.  If no return call will follow up with primary MD office and patient within 2 business days.   [name] RN,BSN,CCM [facility] Telephonic  [phone]

## 2018-07-02 NOTE — Telephone Encounter (Signed)
Call from [name], [facility] - stated she spoke to the pt who stated she was referred to GYN by Dr Rogelia Boga for urinary incontinence. Pt called GYN this week but will not be seen until June; GYN suggested her PCP refer her to Urology to see if they can see her sooner.Pietro Cassis stated pt's incontinence very traumatic to pt. Thanks

## 2018-07-02 NOTE — Telephone Encounter (Signed)
Called / informed Pietro Cassis - urology referral has been placed and she will inform pt.

## 2018-07-02 NOTE — Patient Outreach (Signed)
[facility]) Care Management  07/02/2018  [name] 01/25/1946 [id number]  Nurse Call Line Referral Date:07/01/18 Reason for Referral: incontinence issues Nurse call line recommendation:referral made to [facility] care management  Received return call from [facility] with Dr. Donnelly Stager office stating Dr. Rogelia Boga has submitted referral to urologist as requested.  RNCM expressed appreciation and informed [name] she will contact patient to let her know.   Telephone call to patient for follow up.  HIPAA verified. RNCM informed patient that her primary care provider will be referring her to a urologist.  Advised patient that the urology office will give her a call to schedule appointment.  RNCM completed depression screening with patient. PHQ2 score 5 and PHQ9 score 17.  RNCM discussed and offered follow up with [name] care management social worker for depression symptoms. Patient declined stating has a psychiatrist, Dr. Evelene Croon and has a peer support specialist.  Patient state she is also on medication for depression and anxiety.  RNCM encouraged patient to follow up with her peer support specialist and/ or Dr.Kaur for increased symptoms of depression. Patient verbalized understanding.  Patient states she is morbidly obese and needs assistance with hygiene and changing her bed due to the increased urinary incontinence.  RNCM offered to refer patient to [facility] care management social worker to discuss community resource assistance. Patient verbally agreed. RNCM offered to follow up with patient within 1 week. Patient verbally agreed to next telephone outreach with [name].  RNCM confirmed patient has [facility] care management contact phone number and contact number for 24 hour nurse advise line.  RNCM advised patient to notify MD of any changes in condition prior to scheduled appointment. RNCM verified patient aware of 911 services for urgent/ emergent needs.,  PLAN: RNCM will refer patient to Arts development officer. RNCM will follow up with patient within 1 week.   [name] RN,BSN,CCM [facility] Telephonic  [phone]

## 2018-07-05 ENCOUNTER — Ambulatory Visit: Payer: Medicare HMO

## 2018-07-06 ENCOUNTER — Other Ambulatory Visit: Payer: Self-pay

## 2018-07-06 NOTE — Patient Outreach (Signed)
[facility]) Care Management  07/06/2018  [name] 02/06/1946 [id number]   Successful outreach to patient regarding social work referral.  "Referral to Child psychotherapist for [employer]. Patient was referred by the nurse advise line. Patient reports having severe urinary incontinence and being morbidly obese which has hindered her ability to maintain her hygiene and change her bed linens consistently. Patient is requesting assistance with this. Patient states she applied for Medicaid "some time back" and states she did not qualify at that time".  BSW talked with patient about reason for call.  Patient would like to have aide services to assist with changing of bed linens/hygiene but reports that she cannot  afford to privately pay for services.  Unfortunately, patient is over the income limit to qualify for Medicaid but she did say that she intends to submit an online application. Unfortunately, at this time, private pay is the only option for these services.  Patient did report that she is currently on the wait list for [employer] through [employer].   BSW is closing case but provided her with contact information and encouraged her to call if additional needs or questions arise.    BSW informed patient that RNCM, [name], has outreach call scheduled for 07/08/18.  Malachy Chamber, BSW Social Worker [phone]

## 2018-07-07 ENCOUNTER — Ambulatory Visit: Payer: Self-pay

## 2018-07-08 ENCOUNTER — Other Ambulatory Visit: Payer: Self-pay

## 2018-07-08 NOTE — Patient Outreach (Signed)
[facility]) Care Management  07/08/2018  NAHLANI GRIGG October 31, 1945 [id number]  Nurse Call Line Referral Date:07/01/18 Reason for Referral: incontinence issues Nurse call line recommendation:referral made to [facility] care management  Telephone call to patient regarding nurse call line referral. HIPAA verified with patient. Patient reports she has not heard from the urology office yet but states she received a call from her [facility] insurance stating the referral to the urologist has been approved.  RNCM offered to follow up with patient within 1 week to confirm she has received a call from the urology office to secure an appointment date. Patient verbally agreed.   PLAN; RNCM will follow up with patient within 1 week .  [name] RN,BSN,CCM [facility] Telephonic  [phone]

## 2018-07-15 ENCOUNTER — Other Ambulatory Visit: Payer: Self-pay

## 2018-07-15 ENCOUNTER — Telehealth: Payer: Self-pay | Admitting: *Deleted

## 2018-07-15 NOTE — Telephone Encounter (Signed)
Call from [name] [facility]- asking about urology referral; which urologist's office. Per referral,it has been authorized.  Called [name] again - stated she called [facility] and scheduled pt an appt.

## 2018-07-15 NOTE — Patient Outreach (Signed)
[facility]) Care Management  07/15/2018  PANHIA KREWSON March 05, 1945 [id number]   Nurse Call Line Referral Date:07/01/18 Reason for Referral: incontinence issues Nurse call line recommendation:referral made to [facility] care management  Received voice mail message from patient.  RNCM called patients primary MD office to inquire which urologist patient was referred to. RNCM spoke with nurse Rivka Barbara who states patient was referred to [facility].   RNCM called [facility] and spoke with Selena Batten.  Requested an appointment for patient. Kim scheduled appointment for patient for Monday Jul 19, 2018 at 10:15am with Dr. Myrtice Lauth.  Kim requested patient arrive 15 minutes early to complete paperwork and attend appointment alone due to COVID 19 precautions.   RNCM contacted patient to inform her of scheduled appointment with Dr. Myrtice Lauth for 07/19/18 at 10:15am.  Patient informed to arrive 15 minutes early to appointment and to attend alone.  Patient verbalized  Understanding and appreciation of call.  PLAN:  RNCM will follow up with patient within 1 week.   [name] RN,BSN,CCM [facility] Telephonic  [phone]

## 2018-07-16 ENCOUNTER — Ambulatory Visit: Payer: Self-pay

## 2018-07-19 DIAGNOSIS — R350 Frequency of micturition: Secondary | ICD-10-CM | POA: Diagnosis not present

## 2018-07-19 DIAGNOSIS — N3942 Incontinence without sensory awareness: Secondary | ICD-10-CM | POA: Diagnosis not present

## 2018-07-19 DIAGNOSIS — N3946 Mixed incontinence: Secondary | ICD-10-CM | POA: Diagnosis not present

## 2018-07-20 ENCOUNTER — Other Ambulatory Visit: Payer: Self-pay

## 2018-07-20 NOTE — Patient Outreach (Signed)
[facility]) Care Management  07/20/2018  [name] 1945/12/31 [id number]  Nurse Call Line Referral Date:07/01/18 Reason for Referral: incontinence issues Nurse call line recommendation:referral made to [facility] care management  Telephone call to patient for follow up care coordination. HIPAA verified with patient. Patient states she saw Dr. Celso Amy on Monday 07/18/17.  She states she had a thorough exam and gave a urine sample. Patient states she has a follow up appointment with Dr.McDermid on 07/29/18.  RNCM requested to follow up with patient after appointment. Patient verbally agreed.   PLAN; RNCM will follow up with patient within 1 week .  [name] RN,BSN,CCM [facility] Telephonic  [phone]

## 2018-07-21 ENCOUNTER — Other Ambulatory Visit: Payer: Self-pay | Admitting: Internal Medicine

## 2018-07-23 ENCOUNTER — Encounter: Payer: Self-pay | Admitting: Internal Medicine

## 2018-07-29 DIAGNOSIS — N390 Urinary tract infection, site not specified: Secondary | ICD-10-CM | POA: Diagnosis not present

## 2018-07-29 DIAGNOSIS — R350 Frequency of micturition: Secondary | ICD-10-CM | POA: Diagnosis not present

## 2018-07-30 ENCOUNTER — Other Ambulatory Visit: Payer: Self-pay

## 2018-07-30 NOTE — Patient Outreach (Signed)
[facility]) Care Management  07/30/2018  PERNELL LANOUE Nov 25, 1945 [id number]  Nurse Call Line Referral Date:07/01/18 Reason for Referral: incontinence issues Nurse call line recommendation:referral made to [facility] care management  Telephone call to patient for follow up. HIPAA verified with patient. Patient states she had an appointment for a test and xray on yesterday. She states the test/ xray had to be rescheduled until Tuesday 08/03/18 because she was supposed to take an antibiotic for a urinary tract infection prior to. She states she did not realize this.  Patient states she has started taking the antibiotic.  Patient verbalized appreciation of follow up call.   PLAN;  RNCM will attempt 2nd telephone call to patient within 1 week.   [name] RN,BSN,CCM [facility] Telephonic  [phone]

## 2018-08-03 DIAGNOSIS — R350 Frequency of micturition: Secondary | ICD-10-CM | POA: Diagnosis not present

## 2018-08-05 ENCOUNTER — Other Ambulatory Visit: Payer: Self-pay

## 2018-08-05 DIAGNOSIS — N3942 Incontinence without sensory awareness: Secondary | ICD-10-CM | POA: Diagnosis not present

## 2018-08-05 DIAGNOSIS — N3946 Mixed incontinence: Secondary | ICD-10-CM | POA: Diagnosis not present

## 2018-08-05 NOTE — Patient Outreach (Signed)
[facility]) Care Management  08/05/2018  [name] November 16, 1945 [id number]  Nurse Call Line Referral Date:07/01/18 Reason for Referral: incontinence issues Nurse call line recommendation:referral made to [facility] care management  Telephone call to patient for follow up. HIPAA verified with patient. Patient states she is on her way to a urology appointment. Patient states she will have to talk at another time.   PLAN; RNCM will attempt 2nd telephone call to patient within 4 business days.   [name] RN,BSN,CCM [facility] Telephonic  [phone]

## 2018-08-10 ENCOUNTER — Other Ambulatory Visit: Payer: Self-pay

## 2018-08-10 NOTE — Patient Outreach (Signed)
[facility]) Care Management  08/10/2018  [name] [birth year] [id number]  Nurse Call Line Referral Date:07/01/18 Reason for Referral: incontinence issues Nurse call line recommendation:referral made to [facility] care management  Telephone call to patient regarding nurse call line. HIPAA verified with patient. Patient states she had her follow up with the urologist. She states she is scheduled to see him again on 09/17/18.  Patient states she was prescribed a low dose antibiotic and had additional testing done. She states she does not need to have a sling or surgery per her doctor.  Patient states, " there  Is a noticeable difference in the urinary incontinence. It has gotten better."  Patient voiced appreciation to [facility] for assisting her.  Patient denies any further needs or concerns at this time.  RNCM advised patient to follow up with her doctor as needed.  Advised patient to continue to take her medications as prescribed. Patient verbalized understanding  PLAN; RNCM will close due to patient being assessed and having no further needs.   Quinn Plowman RN,BSN,CCM [facility] Telephonic  [phone]

## 2018-08-18 ENCOUNTER — Ambulatory Visit: Payer: Medicare HMO | Admitting: Obstetrics & Gynecology

## 2018-08-19 ENCOUNTER — Other Ambulatory Visit: Payer: Self-pay | Admitting: Internal Medicine

## 2018-08-19 DIAGNOSIS — I10 Essential (primary) hypertension: Secondary | ICD-10-CM

## 2018-08-26 DIAGNOSIS — N3941 Urge incontinence: Secondary | ICD-10-CM | POA: Diagnosis not present

## 2018-08-26 DIAGNOSIS — R3915 Urgency of urination: Secondary | ICD-10-CM | POA: Diagnosis not present

## 2018-09-02 ENCOUNTER — Other Ambulatory Visit: Payer: Self-pay | Admitting: Internal Medicine

## 2018-09-02 DIAGNOSIS — N3946 Mixed incontinence: Secondary | ICD-10-CM

## 2018-09-02 MED ORDER — MIRABEGRON ER 25 MG PO TB24: 25.0000 mg | ORAL_TABLET | Freq: Every day | ORAL | Status: DC

## 2018-09-17 DIAGNOSIS — N3941 Urge incontinence: Secondary | ICD-10-CM | POA: Diagnosis not present

## 2018-09-17 DIAGNOSIS — N3942 Incontinence without sensory awareness: Secondary | ICD-10-CM | POA: Diagnosis not present

## 2018-09-18 ENCOUNTER — Other Ambulatory Visit: Payer: Self-pay | Admitting: Internal Medicine

## 2018-09-20 NOTE — Telephone Encounter (Signed)
Next appt scheduled 8/13 with PCP. 

## 2018-10-13 DIAGNOSIS — H25013 Cortical age-related cataract, bilateral: Secondary | ICD-10-CM | POA: Diagnosis not present

## 2018-10-13 DIAGNOSIS — H43811 Vitreous degeneration, right eye: Secondary | ICD-10-CM | POA: Diagnosis not present

## 2018-10-14 ENCOUNTER — Other Ambulatory Visit: Payer: Self-pay

## 2018-10-14 ENCOUNTER — Ambulatory Visit (INDEPENDENT_AMBULATORY_CARE_PROVIDER_SITE_OTHER): Payer: Medicare HMO | Admitting: Internal Medicine

## 2018-10-14 ENCOUNTER — Encounter: Payer: Self-pay | Admitting: Internal Medicine

## 2018-10-14 VITALS — BP 114/79 | HR 90 | Temp 98.8°F | Wt 384.7 lb

## 2018-10-14 DIAGNOSIS — Z79899 Other long term (current) drug therapy: Secondary | ICD-10-CM

## 2018-10-14 DIAGNOSIS — N3946 Mixed incontinence: Secondary | ICD-10-CM

## 2018-10-14 DIAGNOSIS — F322 Major depressive disorder, single episode, severe without psychotic features: Secondary | ICD-10-CM

## 2018-10-14 DIAGNOSIS — R0609 Other forms of dyspnea: Secondary | ICD-10-CM

## 2018-10-14 DIAGNOSIS — R351 Nocturia: Secondary | ICD-10-CM

## 2018-10-14 DIAGNOSIS — R0902 Hypoxemia: Secondary | ICD-10-CM

## 2018-10-14 DIAGNOSIS — K760 Fatty (change of) liver, not elsewhere classified: Secondary | ICD-10-CM | POA: Diagnosis not present

## 2018-10-14 DIAGNOSIS — I129 Hypertensive chronic kidney disease with stage 1 through stage 4 chronic kidney disease, or unspecified chronic kidney disease: Secondary | ICD-10-CM | POA: Diagnosis not present

## 2018-10-14 DIAGNOSIS — N182 Chronic kidney disease, stage 2 (mild): Secondary | ICD-10-CM | POA: Diagnosis not present

## 2018-10-14 DIAGNOSIS — J9611 Chronic respiratory failure with hypoxia: Secondary | ICD-10-CM | POA: Diagnosis not present

## 2018-10-14 DIAGNOSIS — R143 Flatulence: Secondary | ICD-10-CM | POA: Diagnosis not present

## 2018-10-14 DIAGNOSIS — Z6841 Body Mass Index (BMI) 40.0 and over, adult: Secondary | ICD-10-CM

## 2018-10-14 DIAGNOSIS — R252 Cramp and spasm: Secondary | ICD-10-CM

## 2018-10-14 DIAGNOSIS — G4733 Obstructive sleep apnea (adult) (pediatric): Secondary | ICD-10-CM | POA: Diagnosis not present

## 2018-10-14 DIAGNOSIS — Z86711 Personal history of pulmonary embolism: Secondary | ICD-10-CM

## 2018-10-14 DIAGNOSIS — I10 Essential (primary) hypertension: Secondary | ICD-10-CM

## 2018-10-14 DIAGNOSIS — Z86718 Personal history of other venous thrombosis and embolism: Secondary | ICD-10-CM | POA: Diagnosis not present

## 2018-10-14 DIAGNOSIS — F332 Major depressive disorder, recurrent severe without psychotic features: Secondary | ICD-10-CM

## 2018-10-14 DIAGNOSIS — Z7901 Long term (current) use of anticoagulants: Secondary | ICD-10-CM

## 2018-10-14 NOTE — Patient Instructions (Signed)
1. Please see me in 3-6 months 2. I will mail you your test results 3. I am sorry you are having a difficult time

## 2018-10-14 NOTE — Assessment & Plan Note (Signed)
This problem is chronic and uncontrolled.  She is unable to use CPAP as she should due to frequent nocturia and the need to remove her mask to go to the toilet.  She has no teeth and therefore is not a candidate for an oral device and oral devices are generally not therapeutic for severe OSA.  Surgery would be risky due to her comorbidities.  PLAN : Follow as there are no great options

## 2018-10-14 NOTE — Assessment & Plan Note (Signed)
This problem is chronic and stable.  She is on Eliquis and states she never misses a dose.  PLAN:  Cont current meds

## 2018-10-14 NOTE — Assessment & Plan Note (Signed)
This problem is chronic and controlled.  He is on Norvasc 10 mg and HCTZ 25 mg and Benicar 20 mg.  We have stopped her HCTZ in the past but it did not improve her urinary incontinence.  Her blood pressure is at goal.  I am checking electrolytes today.  PLAN:  Cont current meds   BP Readings from Last 3 Encounters:  10/14/18 114/79  04/29/18 (!) 147/99  03/05/18 113/86

## 2018-10-14 NOTE — Progress Notes (Signed)
Subjective:    Patient ID: [name], female    DOB: 29-Oct-1945, 73 y.o.   MRN: [id number]  HPI  [name] is here for depression follow-up. Please see the A&P for the status of the pt's chronic medical problems.  She had several issues that she wanted to talk about today 1.  In the first week of March she had 1 week of an illness including fevers of 102 2.  She is getting charley horses on her left abdominal area and in her legs and wants to know she can increase her potassium 3.  Increased flatulence 4.  Left hand numbness and she takes an extra potassium pill and states it helps 5.  Increasing dyspnea on exertion 6.  Worsening urinary incontinence which interrupts her sleep as she is getting up to urinate every 1-3 hours 7.  She is staying up all night and is sleeping all day 8.  Her eating has significantly altered and she is no longer eating vegetables but eating unhealthy processed foods like sandwiches.  She thinks this is because she no longer has a routine and no desire to eat anything else.  This was sudden onset between February and June 15, 2022.  Since her dog has died, she has no routine and no reason to get out of bed like she used to when she had to let him outside.  ROS : per ROS section and in problem oriented charting. All other systems are negative.  Soc hx : She had to have her dog of 18-1/2 years euthanized recently.  Her hind legs were not working and then she stopped eating.  She continues to see a counselor and a depression support group.  PMHx : Includes severe OSA, severe major depression, submassive PE in 2015, HTN, obesity, CKD stage II  Review of Systems  Constitutional: Negative for fever.  HENT: Negative for sore throat.   Respiratory: Positive for shortness of breath. Negative for cough.   Gastrointestinal:       Increased flatulence  Endocrine: Positive for polyuria.  Genitourinary: Positive for frequency.       Positive incontinence  and nocturia  Psychiatric/Behavioral: Positive for sleep disturbance.       Objective:   Physical Exam Vitals signs and nursing note reviewed.  Constitutional:      General: She is not in acute distress.    Appearance: She is well-developed. She is obese. She is not ill-appearing, toxic-appearing or diaphoretic.  HENT:     Head: Normocephalic and atraumatic.     Right Ear: External ear normal.     Left Ear: External ear normal.  Eyes:     General: No scleral icterus.       Right eye: No discharge.        Left eye: No discharge.  Cardiovascular:     Rate and Rhythm: Normal rate and regular rhythm.  Pulmonary:     Comments: No respiratory distress when sitting at rest but significant tachypnea and work of breathing after ambulating.  Her lung auscultation is limited by body habitus Skin:    General: Skin is warm and dry.  Neurological:     General: No focal deficit present.     Mental Status: She is alert. Mental status is at baseline.  Psychiatric:        Mood and Affect: Mood normal.        Behavior: Behavior normal.        Thought Content: Thought content normal.  Judgment: Judgment normal.       Assessment & Plan:

## 2018-10-14 NOTE — Progress Notes (Addendum)
SATURATION QUALIFICATIONS: (This note is used to comply with regulatory documentation for home oxygen)  Patient Saturations on Room Air at Rest =95 %  Patient Saturations on Room Air while Ambulating = 85%  Patient Saturations on 2 Liters of oxygen while Ambulating = 90%  Please briefly explain why patient needs home oxygen: see MD note

## 2018-10-14 NOTE — Assessment & Plan Note (Signed)
This problem is chronic and worsened.  Her PHQ 9 was 20.  She has a Social worker and works with a depression support group.  She states her Wellbutrin was switched to Trintellix by her psychiatrist although she is uncertain when this occurred.  It looks like it was in March but this was changed.  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  I utilized active listening.  She already has a wonderful support system including a Social worker and a psychiatrist.  PLAN : Follow

## 2018-10-14 NOTE — Assessment & Plan Note (Addendum)
This problem is chronic and uncontrolled. She continues to follow-up with urology.  I reviewed the last note from Dr Matilde Sprang and summarized it : described her frustration with her incontinence and poor response to tx, the medication choice of both Myrbetriq and oxybutynin ER 10 mg, continue oral suppressive therapy with Bactrim, and their plan to consider neuromodulation treatment possibly in the future.  PLAN : Follow urology's notes

## 2018-10-14 NOTE — Assessment & Plan Note (Signed)
This is a new problem.  Today she states that she has dyspnea on exertion when she walks.  She states she has not taken out her trash in quite some time because she gets short of breath.  She states she gets short of breath walking from her car to the entrance of the grocery store but once in the store, leaning on a grocery cart helps her dyspnea.  She states she has not missed any of her Eliquis.  She had a hospitalization in 2015 with extensive bilateral PEs with right heart strain consistent with submassive PE.  Her echo at that time showed moderate or severe right ventricular dilation with moderate right ventricular systolic dysfunction and a PAS of 51.  A repeat echo in November of that year showed a right ventricle with the upper limits of normal but could not estimate RVSP.  Her O2 sat was 95% on room air sitting at rest but decreased to 85% with ambulation of only a few feet.  With 2 L of oxygen by nasal cannula, she maintained an O2 sat of 90% but was very dyspneic, tachypneic, with increased respiratory muscle usage after ambulating.  Her hypoxemia is likely multifactorial.  Her chest x-ray in December 2019 was without abnormality and a CT scan in 2017 did not show any pulmonary disease.  Although her CT is outdated and cannot rule out lung disease, I think the most likely etiology of her hypoxemia is due to untreated OSA and OHS causing pulmonary hypertension.  However, chronic thromboembolic disease cannot be ruled out nor can intrinsic pulmonary disease.  My first step is going to be an echo but I am concerned that a TTE will be poor quality due to her body habitus.  The echo results will determine the next step which could include right heart cath (establish pulmonary hypertension dx), CT without contrast (intrinsic lung disease), VQ scan (chronic thromboembolic disease), or something else.  PLAN : Echo

## 2018-10-14 NOTE — Assessment & Plan Note (Signed)
This problem is chronic and worsened.  She has gained 34 pounds since January.  This is multifactorial but I think mostly due to her uncontrolled depression.  She has a mental health team including a psychiatrist, counselor, and depression support group.  She is on pharmaceuticals and her psychiatrist recently made a medication change.  PLAN : follow   Wt Readings from Last 3 Encounters:  10/14/18 (!) 384 lb 11.2 oz (174.5 kg)  03/05/18 (!) 350 lb 8 oz (159 kg)  02/04/18 (!) 350 lb 14.4 oz (159.2 kg)

## 2018-10-15 ENCOUNTER — Other Ambulatory Visit: Payer: Self-pay | Admitting: Internal Medicine

## 2018-10-15 ENCOUNTER — Encounter: Payer: Self-pay | Admitting: Internal Medicine

## 2018-10-15 DIAGNOSIS — I10 Essential (primary) hypertension: Secondary | ICD-10-CM

## 2018-10-15 LAB — CMP14 + ANION GAP
ALT: 17 IU/L (ref 0–32)
AST: 24 IU/L (ref 0–40)
Albumin/Globulin Ratio: 1.3 (ref 1.2–2.2)
Albumin: 4.1 g/dL (ref 3.7–4.7)
Alkaline Phosphatase: 81 IU/L (ref 39–117)
Anion Gap: 19 mmol/L — ABNORMAL HIGH (ref 10.0–18.0)
BUN/Creatinine Ratio: 13 (ref 12–28)
BUN: 17 mg/dL (ref 8–27)
Bilirubin Total: 0.3 mg/dL (ref 0.0–1.2)
CO2: 19 mmol/L — ABNORMAL LOW (ref 20–29)
Calcium: 9.2 mg/dL (ref 8.7–10.3)
Chloride: 99 mmol/L (ref 96–106)
Creatinine, Ser: 1.35 mg/dL — ABNORMAL HIGH (ref 0.57–1.00)
GFR calc Af Amer: 46 mL/min/{1.73_m2} — ABNORMAL LOW (ref >59)
GFR calc non Af Amer: 40 mL/min/{1.73_m2} — ABNORMAL LOW (ref >59)
Globulin, Total: 3.1 g/dL (ref 1.5–4.5)
Glucose: 116 mg/dL — ABNORMAL HIGH (ref 65–99)
Potassium: 4.2 mmol/L (ref 3.5–5.2)
Sodium: 137 mmol/L (ref 134–144)
Total Protein: 7.2 g/dL (ref 6.0–8.5)

## 2018-10-15 LAB — CBC
Hematocrit: 43.8 % (ref 34.0–46.6)
Hemoglobin: 14.2 g/dL (ref 11.1–15.9)
MCH: 26.2 pg — ABNORMAL LOW (ref 26.6–33.0)
MCHC: 32.4 g/dL (ref 31.5–35.7)
MCV: 81 fL (ref 79–97)
Platelets: 241 10*3/uL (ref 150–450)
RBC: 5.43 x10E6/uL — ABNORMAL HIGH (ref 3.77–5.28)
RDW: 14.3 % (ref 11.7–15.4)
WBC: 7.5 10*3/uL (ref 3.4–10.8)

## 2018-10-15 LAB — MAGNESIUM: Magnesium: 2 mg/dL (ref 1.6–2.3)

## 2018-10-21 ENCOUNTER — Other Ambulatory Visit: Payer: Self-pay

## 2018-10-21 ENCOUNTER — Ambulatory Visit (HOSPITAL_COMMUNITY)
Admission: RE | Admit: 2018-10-21 | Discharge: 2018-10-21 | Disposition: A | Discharge status: Home / Self Care | Payer: Medicare HMO | Source: Ambulatory Visit | Attending: Internal Medicine | Admitting: Internal Medicine

## 2018-10-21 DIAGNOSIS — I10 Essential (primary) hypertension: Secondary | ICD-10-CM | POA: Diagnosis not present

## 2018-10-21 DIAGNOSIS — R0902 Hypoxemia: Secondary | ICD-10-CM | POA: Diagnosis not present

## 2018-10-21 DIAGNOSIS — R0609 Other forms of dyspnea: Secondary | ICD-10-CM | POA: Insufficient documentation

## 2018-10-21 DIAGNOSIS — E785 Hyperlipidemia, unspecified: Secondary | ICD-10-CM | POA: Diagnosis not present

## 2018-10-21 DIAGNOSIS — I2510 Atherosclerotic heart disease of native coronary artery without angina pectoris: Secondary | ICD-10-CM | POA: Insufficient documentation

## 2018-10-21 NOTE — Progress Notes (Signed)
  Echocardiogram 2D Echocardiogram has been performed by [facility].  [name] 10/21/2018, 2:21 PM

## 2018-10-22 DIAGNOSIS — N3946 Mixed incontinence: Secondary | ICD-10-CM | POA: Diagnosis not present

## 2018-10-22 DIAGNOSIS — N3942 Incontinence without sensory awareness: Secondary | ICD-10-CM | POA: Diagnosis not present

## 2018-12-09 ENCOUNTER — Other Ambulatory Visit: Payer: Self-pay | Admitting: Internal Medicine

## 2019-01-13 ENCOUNTER — Encounter: Payer: Self-pay | Admitting: *Deleted

## 2019-01-13 NOTE — Progress Notes (Unsigned)

## 2019-01-14 NOTE — Progress Notes (Signed)
I already did this but it was on paper and handed it to [place] and discussed the visit.

## 2019-01-20 ENCOUNTER — Ambulatory Visit (INDEPENDENT_AMBULATORY_CARE_PROVIDER_SITE_OTHER): Payer: Medicare HMO | Admitting: Internal Medicine

## 2019-01-20 ENCOUNTER — Other Ambulatory Visit: Payer: Self-pay

## 2019-01-20 VITALS — BP 124/48 | HR 78 | Temp 98.2°F | Wt 382.5 lb

## 2019-01-20 DIAGNOSIS — J849 Interstitial pulmonary disease, unspecified: Secondary | ICD-10-CM | POA: Diagnosis not present

## 2019-01-20 DIAGNOSIS — J432 Centrilobular emphysema: Secondary | ICD-10-CM

## 2019-01-20 DIAGNOSIS — Z87891 Personal history of nicotine dependence: Secondary | ICD-10-CM | POA: Diagnosis not present

## 2019-01-20 DIAGNOSIS — G4733 Obstructive sleep apnea (adult) (pediatric): Secondary | ICD-10-CM

## 2019-01-20 DIAGNOSIS — I749 Embolism and thrombosis of unspecified artery: Secondary | ICD-10-CM | POA: Diagnosis not present

## 2019-01-20 DIAGNOSIS — R140 Abdominal distension (gaseous): Secondary | ICD-10-CM

## 2019-01-20 DIAGNOSIS — J9611 Chronic respiratory failure with hypoxia: Secondary | ICD-10-CM | POA: Diagnosis not present

## 2019-01-20 DIAGNOSIS — Z86711 Personal history of pulmonary embolism: Secondary | ICD-10-CM | POA: Diagnosis not present

## 2019-01-20 DIAGNOSIS — N3946 Mixed incontinence: Secondary | ICD-10-CM | POA: Diagnosis not present

## 2019-01-20 DIAGNOSIS — Z23 Encounter for immunization: Secondary | ICD-10-CM | POA: Diagnosis not present

## 2019-01-20 DIAGNOSIS — R0902 Hypoxemia: Secondary | ICD-10-CM

## 2019-01-20 DIAGNOSIS — Z79899 Other long term (current) drug therapy: Secondary | ICD-10-CM

## 2019-01-20 NOTE — Assessment & Plan Note (Addendum)
This problem was detected at her last office visit.  Oxygen was not set up.  She continues to have shortness of breath, increased work of breathing, and fatigue when she ambulates.  She has to lean over a shopping cart when at the grocery store.  Echo was poor quality but did not show any overt abnormalities.  She does have about a 30-pack-year history of cigarette usage.  She started at about the age of 54 and quit at about the age of 23, smoking 1 pack/day.  She was hypoxic with ambulation, dropping to 85 but quickly recovering to 95% after about 1 minutes sitting at rest.  She had significant tachypnea and also tachycardia with ambulation.  I had suspected I would find right ventricular failure/pulmonary hypertension on her echo as she has many reasons to have pulmonary hypertension including OSA and prior PE.  My differential for her hypoxia includes COPD as her CT in 2017 showed mild centrilobular emphysema, ILD, or chronic thromboembolic disease.  However, with all of these I would have expected pulmonary hypertension.  We discussed that we could start the work-up with a CT scan without contrast and PFTs.  If these are unrevealing, we will then go to a VQ scan.  If that is unrevealing also, I would discuss with cardiology a right heart cath to get a better look at her right sided cardiac pressures.  OHS is a possibility however her serum CO2 has never been elevated and most recently has been low.  Pulmonary edema is also a possibility but does not appear volume overloaded, although exam is difficult, and hypoxia is only with ambulation.  Her CT scan will also show pulmonary edema.  I have also reordered oxygen to be used with ambulation.  PLAN : CT scan without contrast PFTs with preprocedure Covid testing O2 with ambulation   PFTs show normal spirometry with reduced DLCO.  I am awaiting final reading.  Differential would include chronic recurrent pulmonary emboli, idiopathic pulmonary arterial  hypertension, and pulmonary vascular involvement with rheumatic diseases and vasculitides along with hepatopulmonary syndrome or anemia

## 2019-01-20 NOTE — Progress Notes (Signed)
   Subjective:    Patient ID: [name], female    DOB: May 03, 1945, 73 y.o.   MRN: [id number]  HPI  Alanie T Wrenn is here for hypoxemia. Please see the A&P for the status of the pt's chronic medical problems.  ROS : per ROS section and in problem oriented charting. All other systems are negative.  PMHx, Soc hx, and / or Fam hx : Looking to get into an independent living facility nearby.  Review of Systems  Constitutional: Positive for fatigue. Negative for unexpected weight change.  Respiratory: Positive for shortness of breath.   Neurological: Positive for weakness.       Objective:   Physical Exam Constitutional:      Appearance: Normal appearance. She is obese.  HENT:     Head: Normocephalic and atraumatic.     Right Ear: External ear normal.     Left Ear: External ear normal.  Eyes:     General: No scleral icterus.       Right eye: No discharge.        Left eye: No discharge.     Extraocular Movements: Extraocular movements intact.     Conjunctiva/sclera: Conjunctivae normal.  Pulmonary:     Comments: Transient respiratory distress with tachypnea with ambulation Neurological:     Mental Status: She is alert.  Psychiatric:        Mood and Affect: Mood normal.        Behavior: Behavior normal.        Thought Content: Thought content normal.        Judgment: Judgment normal.           Assessment & Plan:

## 2019-01-20 NOTE — Patient Instructions (Signed)
I will call you to complete paperwork for independent living

## 2019-01-20 NOTE — Assessment & Plan Note (Signed)
This problem is chronic and stable.  She continues to see her urologist.  She indicates that she has had some improvement and remains on her Bactrim, oxybutynin, and mirabegron.  PLAN : Follow urology notes

## 2019-01-25 ENCOUNTER — Telehealth: Payer: Self-pay | Admitting: Internal Medicine

## 2019-01-25 NOTE — Addendum Note (Signed)
Addended by: Larey Dresser A on: 01/25/2019 02:17 PM   Modules accepted: Orders

## 2019-01-25 NOTE — Telephone Encounter (Signed)
Pls contact nurse [phone]

## 2019-01-25 NOTE — Telephone Encounter (Signed)
Pt calling to check on the progress of getting her 02 setup at home for ambulation and the CT. Sending to dr Software engineer, chilonb., mamieh. And lauren

## 2019-01-25 NOTE — Telephone Encounter (Signed)
Oxygen order placed 10/14/2018. Patient had OV with PCP 01/20/2019 where SpO2 dropped to 85% while ambulating. Community message sent to Jolee Ewing at [facility] for home oxygen order. Hubbard Hartshorn, BSN, RN-BC

## 2019-01-25 NOTE — Telephone Encounter (Signed)
Order is in The doctor did not state she has COPD.  It is inappropriate to try diagnosis directed therapies until I have a diagnosis.  I do not know yet what is causing her hypoxemia.  Work-up is underway. Her oxygen saturation levels were documented in my note. Thanks

## 2019-01-25 NOTE — Telephone Encounter (Signed)
Catron, Germain Osgood, Orvis Brill, RN; Catron, Stanford Breed, [place]; Fredderick Erb, [place], [place]        Ok well we will have to wait on an official diagnosis, and she will have to have all 3 saturation levels if her at rest on room air is over 88. The order, saturation levels, and notes must all be within 30 days of each other.   Due to insurance guidelines, we cannot accept the on room air on exertion saturation without the other 2. We must have them worded just as I stated below. We must also have a chronic respiratory diagnosis in the notes. Hypoxia and shortness of breath are considered symptoms in insurances view.  Let me know when we have a definitive chronic resp dx, the saturation levels updated and the notes will have to be updated with alternate methods as well.

## 2019-01-25 NOTE — Telephone Encounter (Signed)
I will take a look at the paperwork and see if I can fill it out.],  And an office visit will be great.  Her O2 sat at rest on room air was 95%.  Her O2 sat on exertion on room air was 88%.  I did not do O2 sat on oxygen on exertion.  And never even thought to.  Her diagnosis is chronic hypoxic respiratory failure.  Will that do to get insurance coverage?

## 2019-01-25 NOTE — Telephone Encounter (Signed)
Catron, Germain Osgood, Orvis Brill, RN; Catron, Stanford Breed, [place]; Fredderick Erb, Mamie C, [place]        I did place an order to have a our processors call to offer her private pay o2 while she is being assessed.

## 2019-01-25 NOTE — Telephone Encounter (Signed)
Catron, Germain Osgood, Orvis Brill, RN; Catron, Stanford Breed, [place]; Fredderick Erb, Mamie C, [place]        In order for [place] to process the order and bill her insurance we will need all of the below   (1) New order entered as o2 orders are only valid for 30 days- it needs to have the liter flow on it and how she is to use the oxygen- such as continuously or on exertion only   (2) Notes need to indicate any alternate methods that have been tried prior to oxygen being ordered. I see the Dr is stating she has copd so alternate methods are bronchiodilators, inhaled steriods, combination inhalers, oral steroids, incentive spirometer, flutter valve, cpap therapy, pulmonary rehab.   (3) We also need her saturation levels updated under the plan of care section and they must be worded as below   A-at rest on room air = ____%--- if this one is over 88 we need B and C   B. on exertion on room air =____%  C. on __liters a minute o2 on exertion=____%

## 2019-01-25 NOTE — Telephone Encounter (Signed)
Meant 85% on exertion on RA  Updated dx in note

## 2019-01-28 IMAGING — RF DG BE THRU COLOSTOMY
1 series · 15 of 19 positions shown · non-contrast
Comparison: CT abdomen pelvis 08/04/2015

CLINICAL DATA: Family history colon cancer.  Screening exam.

EXAM:
SINGLE COLUMN BARIUM ENEMA
TECHNIQUE: Initial scout AP supine abdominal image obtained to insure adequate
colon cleansing. Barium was introduced into the colon in a
retrograde fashion and refluxed from the rectum to the cecum. Spot
images of the colon followed by overhead radiographs were obtained.
FLUOROSCOPY TIME:  Fluoroscopy Time:  1 minutes 24 seconds
Radiation Exposure Index (if provided by the fluoroscopic device):
Number of Acquired Spot Images: 0

[Series 1: one shot · 0.14mm/px · 15 of 19 slices shown]
[im 1/19]
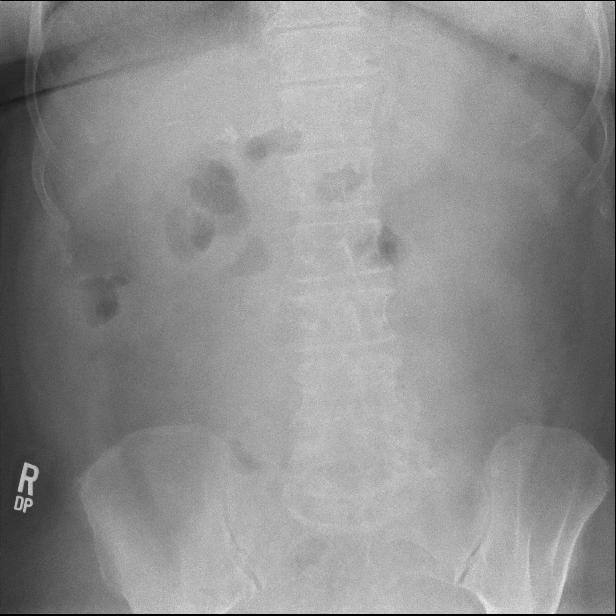
[im 2/19]
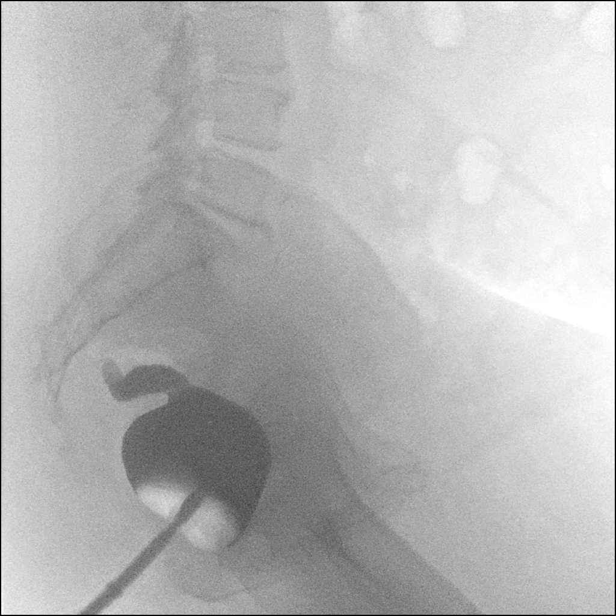
[im 4/19]
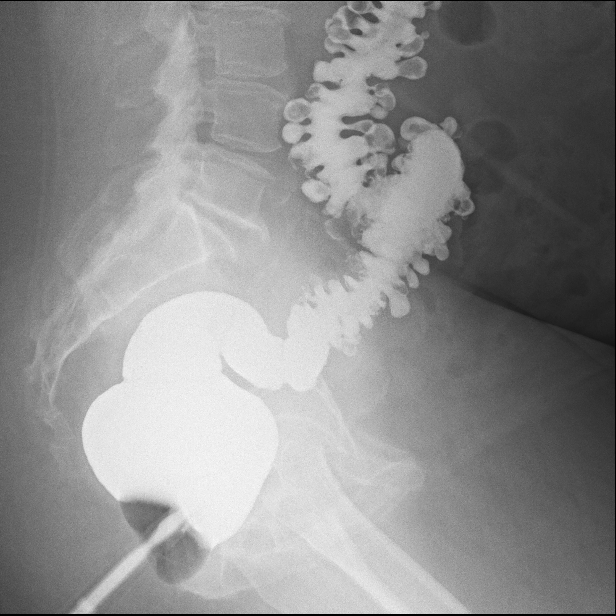
[im 5/19]
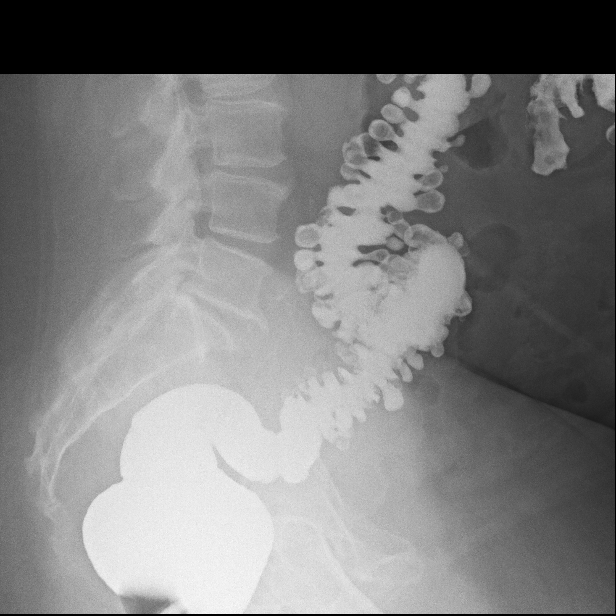
[im 6/19]
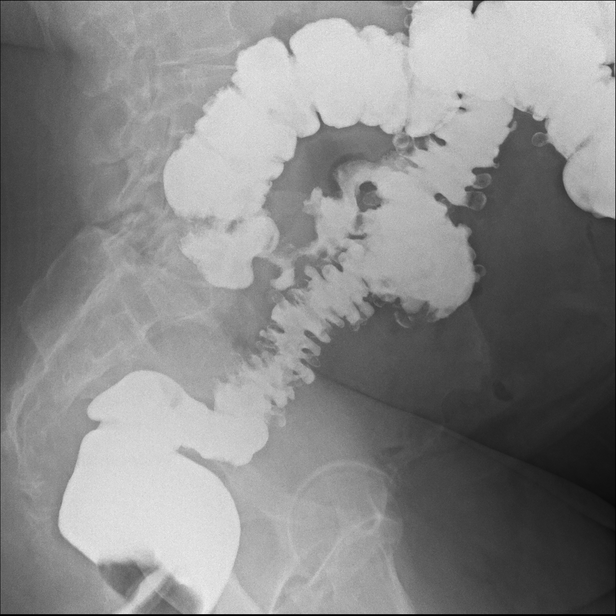
[im 7/19]
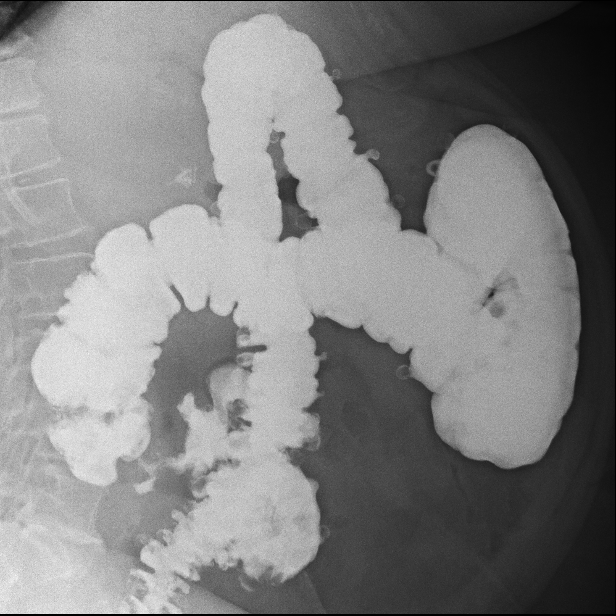
[im 9/19]
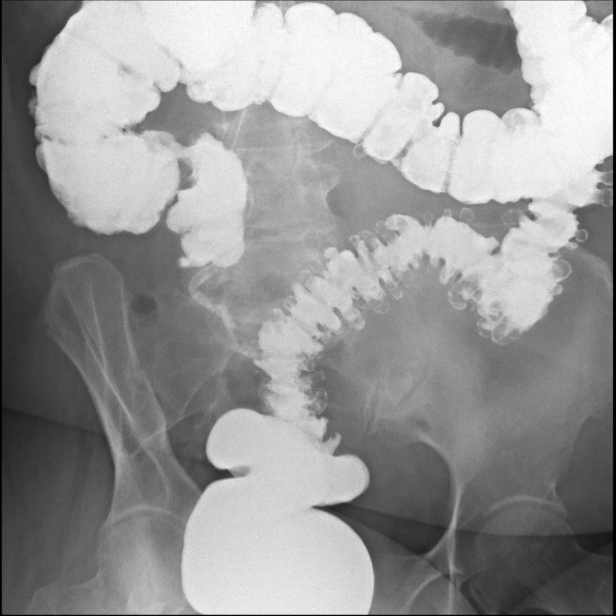
[im 10/19]
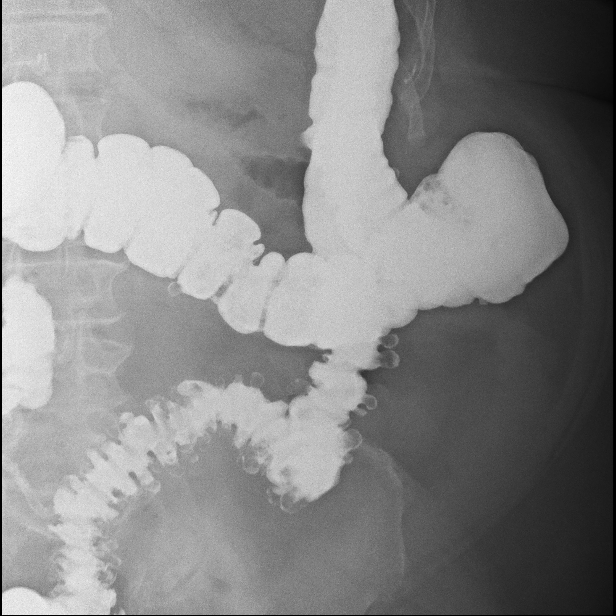
[im 11/19]
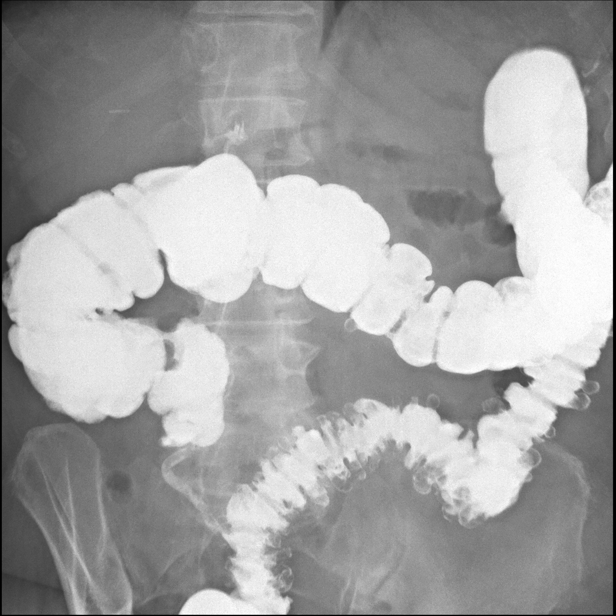
[im 13/19]
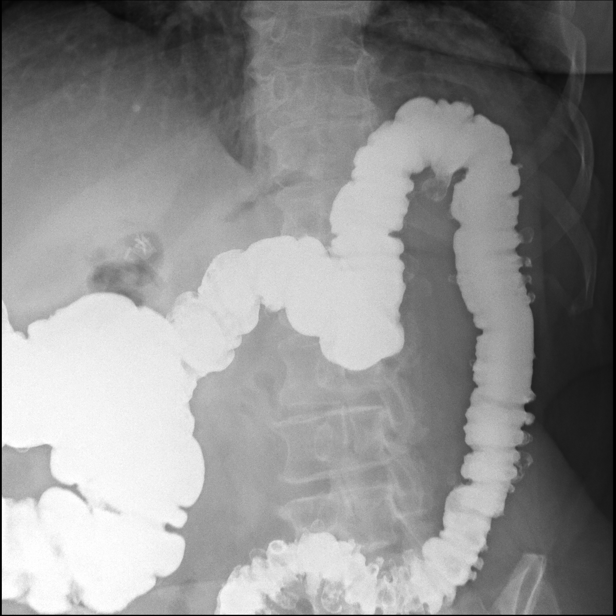
[im 14/19]
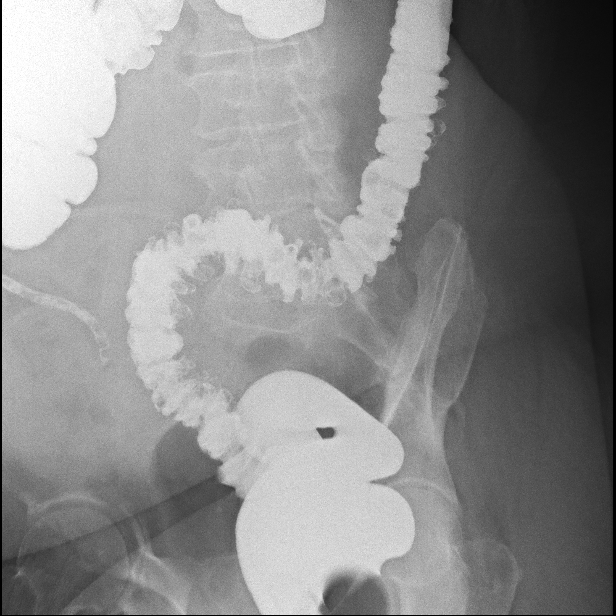
[im 15/19]
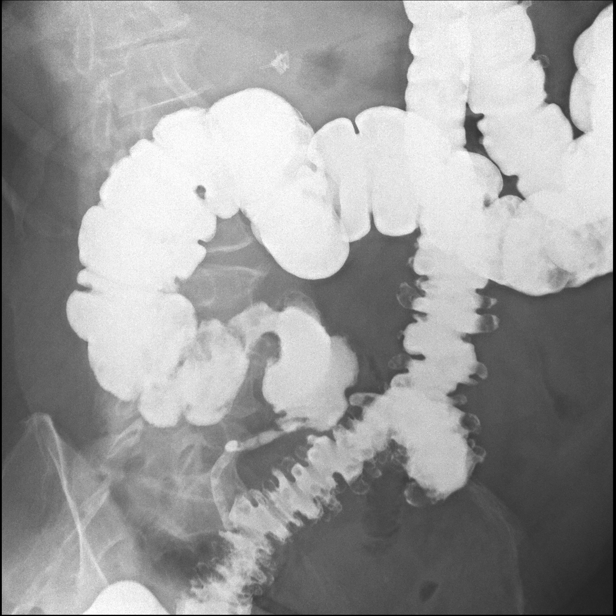
[im 16/19]
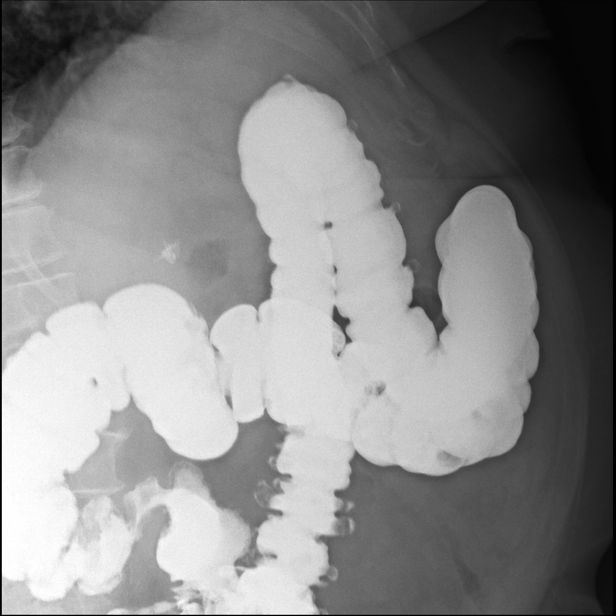
[im 18/19]
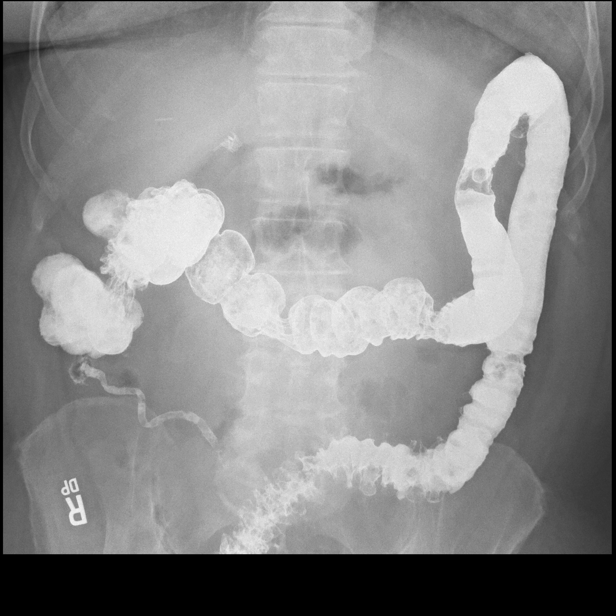
[im 19/19]
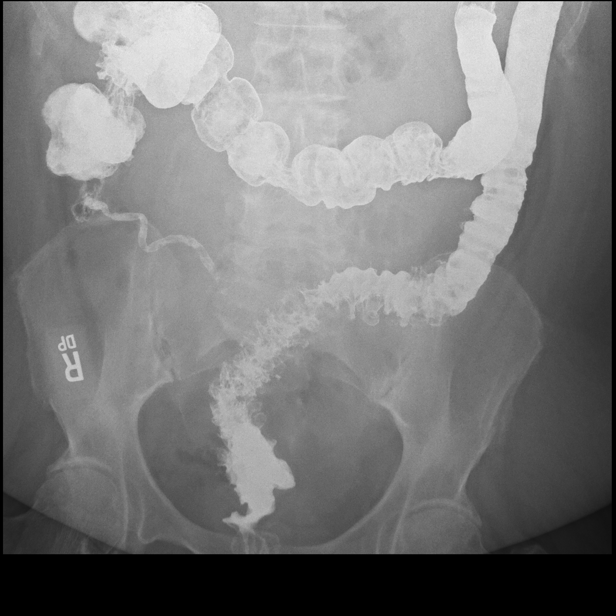

[15 of 19 positions shown; findings below may reference images not displayed]

FINDINGS: Preliminary KUB demonstrates normal bowel gas pattern. Surgical
clips in the gallbladder fossa

Rectal catheter was appropriately positioned. No mass in the rectum.
Extensive diverticulosis which is most severe in the sigmoid colon
but extends into the left colon and transverse colon.

Negative for stricture or mass. No obstruction. Cecum normal.
Appendix fills with contrast.

Satisfactory evacuation of barium at the end of the study.
IMPRESSION: Extensive diverticulosis of the colon most severe in the sigmoid. No
stricture or obstruction.

Negative for neoplasm.

## 2019-01-29 ENCOUNTER — Other Ambulatory Visit (HOSPITAL_COMMUNITY)
Admission: RE | Admit: 2019-01-29 | Discharge: 2019-01-29 | Disposition: A | Discharge status: Home / Self Care | Payer: Medicare HMO | Source: Ambulatory Visit | Attending: Internal Medicine | Admitting: Internal Medicine

## 2019-01-29 DIAGNOSIS — Z01812 Encounter for preprocedural laboratory examination: Secondary | ICD-10-CM | POA: Insufficient documentation

## 2019-01-29 DIAGNOSIS — Z20828 Contact with and (suspected) exposure to other viral communicable diseases: Secondary | ICD-10-CM | POA: Insufficient documentation

## 2019-01-29 LAB — SARS CORONAVIRUS 2 (TAT 6-24 HRS): SARS Coronavirus 2: NEGATIVE

## 2019-01-31 NOTE — Telephone Encounter (Signed)
Patient is scheduled for AWV this Friday 02/04/2019 at 1000. Hubbard Hartshorn, BSN, RN-BC

## 2019-02-01 ENCOUNTER — Ambulatory Visit (HOSPITAL_COMMUNITY)
Admission: RE | Admit: 2019-02-01 | Discharge: 2019-02-01 | Disposition: A | Discharge status: Home / Self Care | Payer: Medicare HMO | Source: Ambulatory Visit | Attending: Internal Medicine | Admitting: Internal Medicine

## 2019-02-01 DIAGNOSIS — R0902 Hypoxemia: Secondary | ICD-10-CM | POA: Diagnosis not present

## 2019-02-01 LAB — PULMONARY FUNCTION TEST
DL/VA % pred: 66 %
DL/VA: 2.69 ml/min/mmHg/L
DLCO unc % pred: 48 %
DLCO unc: 10.58 ml/min/mmHg
FEF 25-75 Post: 1.72 L/sec
FEF 25-75 Pre: 1.99 L/sec
FEF2575-%Change-Post: -13 %
FEF2575-%Pred-Post: 90 %
FEF2575-%Pred-Pre: 104 %
FEV1-%Change-Post: -2 %
FEV1-%Pred-Post: 92 %
FEV1-%Pred-Pre: 94 %
FEV1-Post: 1.96 L
FEV1-Pre: 2 L
FEV1FVC-%Change-Post: 0 %
FEV1FVC-%Pred-Pre: 104 %
FEV6-%Change-Post: -3 %
FEV6-%Pred-Post: 91 %
FEV6-%Pred-Pre: 94 %
FEV6-Post: 2.42 L
FEV6-Pre: 2.5 L
FEV6FVC-%Change-Post: 0 %
FEV6FVC-%Pred-Post: 103 %
FEV6FVC-%Pred-Pre: 103 %
FVC-%Change-Post: -2 %
FVC-%Pred-Post: 88 %
FVC-%Pred-Pre: 91 %
FVC-Post: 2.43 L
FVC-Pre: 2.5 L
Post FEV1/FVC ratio: 81 %
Post FEV6/FVC ratio: 99 %
Pre FEV1/FVC ratio: 80 %
Pre FEV6/FVC Ratio: 100 %
RV % pred: 58 %
RV: 1.4 L
TLC % pred: 85 %
TLC: 4.8 L

## 2019-02-01 MED ORDER — ALBUTEROL SULFATE (2.5 MG/3ML) 0.083% IN NEBU
2.5000 mg | INHALATION_SOLUTION | Freq: Once | RESPIRATORY_TRACT | Status: AC
  Administered 2019-02-01: 15:00:00 2.5 mg via RESPIRATORY_TRACT

## 2019-02-01 NOTE — Addendum Note (Signed)
Addended by: Larey Dresser A on: 02/01/2019 04:37 PM   Modules accepted: Orders

## 2019-02-04 ENCOUNTER — Ambulatory Visit (INDEPENDENT_AMBULATORY_CARE_PROVIDER_SITE_OTHER): Payer: Medicare HMO | Admitting: Internal Medicine

## 2019-02-04 ENCOUNTER — Other Ambulatory Visit: Payer: Self-pay

## 2019-02-04 ENCOUNTER — Encounter: Payer: Self-pay | Admitting: Internal Medicine

## 2019-02-04 VITALS — BP 106/67 | HR 80 | Ht 68.0 in | Wt 384.3 lb

## 2019-02-04 DIAGNOSIS — J9611 Chronic respiratory failure with hypoxia: Secondary | ICD-10-CM

## 2019-02-04 DIAGNOSIS — Z0000 Encounter for general adult medical examination without abnormal findings: Secondary | ICD-10-CM

## 2019-02-04 DIAGNOSIS — I10 Essential (primary) hypertension: Secondary | ICD-10-CM

## 2019-02-04 DIAGNOSIS — K760 Fatty (change of) liver, not elsewhere classified: Secondary | ICD-10-CM

## 2019-02-04 DIAGNOSIS — Z111 Encounter for screening for respiratory tuberculosis: Secondary | ICD-10-CM | POA: Diagnosis not present

## 2019-02-04 LAB — POCT GLYCOSYLATED HEMOGLOBIN (HGB A1C): Hemoglobin A1C: 5.7 % — AB (ref 4.0–5.6)

## 2019-02-04 LAB — GLUCOSE, CAPILLARY: Glucose-Capillary: 105 mg/dL — ABNORMAL HIGH (ref 70–99)

## 2019-02-04 NOTE — Progress Notes (Signed)
Internal Medicine Clinic Attending  Case discussed with Howell Rucks. We reviewed the AWV findings.  I agree with the assessment, diagnosis, and plan of care documented in the AWV note.

## 2019-02-04 NOTE — Patient Instructions (Addendum)
Annual Wellness Visit   Medicare Covered Preventative Screenings and Services  Services & Screenings Men and Women Who How Often Need? Date of Last Service Action  Abdominal Aortic Aneurysm Adults with AAA risk factors Once     Alcohol Misuse and Counseling All Adults Screening once a year if no alcohol misuse. Counseling up to 4 face to face sessions. Yes  Does not drink alcohol  Bone Density Measurement  Adults at risk for osteoporosis Once every 2 yrs     Lipid Panel Z13.6 All adults without CV disease Once every 5 yrs     Colorectal Cancer   Stool sample or  Colonoscopy All adults 47 and older   Once every year  Every 10 years No  Sigmoidoscopy 2020  Depression All Adults Once a year  Today   Diabetes Screening Blood glucose, post glucose load, or GTT Z13.1  All adults at risk  Pre-diabetics  Once per year  Twice per year Yes  Last A1c 5.6 on 08/2017  Diabetes  Self-Management Training All adults Diabetics 10 hrs first year; 2 hours subsequent years. Requires Copay     Glaucoma  Diabetics  Family history of glaucoma  African Americans 7 yrs +  Hispanic Americans 82 yrs + Annually - requires coppay     Hepatitis C Z72.89 or F19.20  High Risk for HCV  Born between 1945 and 1965  Annually  Once     HIV Z11.4 All adults based on risk  Annually btw ages 62 & 71 regardless of risk  Annually > 65 yrs if at increased risk     Lung Cancer Screening Asymptomatic adults aged 56-77 with 30 pack yr history and current smoker OR quit within the last 15 yrs Annually Must have counseling and shared decision making documentation before first screen     Medical Nutrition Therapy Adults with   Diabetes  Renal disease  Kidney transplant within past 3 yrs 3 hours first year; 2 hours subsequent years     Obesity and Counseling All adults Screening once a year Counseling if BMI 30 or higher Yes Today Referral to [facility] who use tobacco   Up to 8 visits in one year     Vaccines Z23  Hepatitis B  Influenza   Pneumonia  Adults   Once  Once every flu season  Two different vaccines separated by one year   Flu vaccine yearly starting September 1  Next Annual Wellness Visit People with Medicare Every year  Today     Services & Screenings Women Who How Often Need  Date of Last Service Action  Mammogram  Z12.31 Women over 35 One baseline ages 76-39. Annually ager 40 yrs+     Pap tests All women Annually if high risk. Every 2 yrs for normal risk women     Screening for cervical cancer with   Pap (Z01.419 nl or Z01.411abnl) &  HPV Z11.51 Women aged 57 to 18 Once every 5 yrs     Screening pelvic and breast exams All women Annually if high risk. Every 2 yrs for normal risk women     Sexually Transmitted Diseases  Chlamydia  Gonorrhea  Syphilis All at risk adults Annually for non pregnant females at increased risk         [place] Men Who How Ofter Need  Date of Last Service Action  Prostate Cancer - DRE & PSA Men over 50 Annually.  DRE might require  a copay.     Sexually Transmitted Diseases  Syphilis All at risk adults Annually for men at increased risk         Things That May Be Affecting Your Health:  Alcohol  Hearing loss  Pain   X Depression X Home Safety  Sexual Health   Diabetes  Lack of physical activity X Stress  X Difficulty with daily activities X Loneliness  Tiredness   Drug use  Medicines  Tobacco use   Falls  Motor Vehicle Safety X Weight  X Food choices  Oral Health  Other    YOUR PERSONALIZED HEALTH PLAN : 1. Schedule your next subsequent Medicare Wellness visit in one year 2. Attend all of your regular appointments to address your medical issues 3. Complete the preventative screenings and services 4. Congratulations on your 5% weight loss goal. A referral has been placed to [facility] for help with this. 5. Begin seated and standing exercises with exercise band for  10 minutes at least once weekly.   Fall Prevention in the Home, Adult Falls can cause injuries. They can happen to people of all ages. There are many things you can do to make your home safe and to help prevent falls. Ask for help when making these changes, if needed. What actions can I take to prevent falls? General Instructions  Use good lighting in all rooms. Replace any light bulbs that burn out.  Turn on the lights when you go into a dark area. Use night-lights.  Keep items that you use often in easy-to-reach places. Lower the shelves around your home if necessary.  Set up your furniture so you have a clear path. Avoid moving your furniture around.  Do not have throw rugs and other things on the floor that can make you trip.  Avoid walking on wet floors.  If any of your floors are uneven, fix them.  Add color or contrast paint or tape to clearly mark and help you see: ? Any grab bars or handrails. ? First and last steps of stairways. ? Where the edge of each step is.  If you use a stepladder: ? Make sure that it is fully opened. Do not climb a closed stepladder. ? Make sure that both sides of the stepladder are locked into place. ? Ask someone to hold the stepladder for you while you use it.  If there are any pets around you, be aware of where they are. What can I do in the bathroom?      Keep the floor dry. Clean up any water that spills onto the floor as soon as it happens.  Remove soap buildup in the tub or shower regularly.  Use non-skid mats or decals on the floor of the tub or shower.  Attach bath mats securely with double-sided, non-slip rug tape.  If you need to sit down in the shower, use a plastic, non-slip stool.  Install grab bars by the toilet and in the tub and shower. Do not use towel bars as grab bars. What can I do in the bedroom?  Make sure that you have a light by your bed that is easy to reach.  Do not use any sheets or blankets that are  too big for your bed. They should not hang down onto the floor.  Have a firm chair that has side arms. You can use this for support while you get dressed. What can I do in the kitchen?  Clean up any spills  right away.  If you need to reach something above you, use a strong step stool that has a grab bar.  Keep electrical cords out of the way.  Do not use floor polish or wax that makes floors slippery. If you must use wax, use non-skid floor wax. What can I do with my stairs?  Do not leave any items on the stairs.  Make sure that you have a light switch at the top of the stairs and the bottom of the stairs. If you do not have them, ask someone to add them for you.  Make sure that there are handrails on both sides of the stairs, and use them. Fix handrails that are broken or loose. Make sure that handrails are as long as the stairways.  Install non-slip stair treads on all stairs in your home.  Avoid having throw rugs at the top or bottom of the stairs. If you do have throw rugs, attach them to the floor with carpet tape.  Choose a carpet that does not hide the edge of the steps on the stairway.  Check any carpeting to make sure that it is firmly attached to the stairs. Fix any carpet that is loose or worn. What can I do on the outside of my home?  Use bright outdoor lighting.  Regularly fix the edges of walkways and driveways and fix any cracks.  Remove anything that might make you trip as you walk through a door, such as a raised step or threshold.  Trim any bushes or trees on the path to your home.  Regularly check to see if handrails are loose or broken. Make sure that both sides of any steps have handrails.  Install guardrails along the edges of any raised decks and porches.  Clear walking paths of anything that might make someone trip, such as tools or rocks.  Have any leaves, snow, or ice cleared regularly.  Use sand or salt on walking paths during winter.  Clean  up any spills in your garage right away. This includes grease or oil spills. What other actions can I take?  Wear shoes that: ? Have a low heel. Do not wear high heels. ? Have rubber bottoms. ? Are comfortable and fit you well. ? Are closed at the toe. Do not wear open-toe sandals.  Use tools that help you move around (mobility aids) if they are needed. These include: ? Canes. ? Walkers. ? Scooters. ? Crutches.  Review your medicines with your doctor. Some medicines can make you feel dizzy. This can increase your chance of falling. Ask your doctor what other things you can do to help prevent falls. Where to find more information  Centers for Disease Control and Prevention, STEADI: HealthcareCounselor.com.pthttps://cdc.gov  [employer] on Aging: RingConnections.sihttps://go4life.nia.nih.gov Contact a doctor if:  You are afraid of falling at home.  You feel weak, drowsy, or dizzy at home.  You fall at home. Summary  There are many simple things that you can do to make your home safe and to help prevent falls.  Ways to make your home safe include removing tripping hazards and installing grab bars in the bathroom.  Ask for help when making these changes in your home. This information is not intended to replace advice given to you by your health care provider. Make sure you discuss any questions you have with your health care provider. Document Released: 12/14/2008 Document Revised: 06/10/2018 Document Reviewed: 10/02/2016 Elsevier Patient Education  2020 [employer]   Health  Maintenance, Female Adopting a healthy lifestyle and getting preventive care are important in promoting health and wellness. Ask your health care provider about:  The right schedule for you to have regular tests and exams.  Things you can do on your own to prevent diseases and keep yourself healthy. What should I know about diet, weight, and exercise? Eat a healthy diet   Eat a diet that includes plenty of vegetables, fruits,  low-fat dairy products, and lean protein.  Do not eat a lot of foods that are high in solid fats, added sugars, or sodium. Maintain a healthy weight Body mass index (BMI) is used to identify weight problems. It estimates body fat based on height and weight. Your health care provider can help determine your BMI and help you achieve or maintain a healthy weight. Get regular exercise Get regular exercise. This is one of the most important things you can do for your health. Most adults should:  Exercise for at least 150 minutes each week. The exercise should increase your heart rate and make you sweat (moderate-intensity exercise).  Do strengthening exercises at least twice a week. This is in addition to the moderate-intensity exercise.  Spend less time sitting. Even light physical activity can be beneficial. Watch cholesterol and blood lipids Have your blood tested for lipids and cholesterol at 71 years of age, then have this test every 5 years. Have your cholesterol levels checked more often if:  Your lipid or cholesterol levels are high.  You are older than 71 years of age.  You are at high risk for heart disease. What should I know about cancer screening? Depending on your health history and family history, you may need to have cancer screening at various ages. This may include screening for:  Breast cancer.  Cervical cancer.  Colorectal cancer.  Skin cancer.  Lung cancer. What should I know about heart disease, diabetes, and high blood pressure? Blood pressure and heart disease  High blood pressure causes heart disease and increases the risk of stroke. This is more likely to develop in people who have high blood pressure readings, are of African descent, or are overweight.  Have your blood pressure checked: ? Every 3-5 years if you are 48-54 years of age. ? Every year if you are 82 years old or older. Diabetes Have regular diabetes screenings. This checks your fasting  blood sugar level. Have the screening done:  Once every three years after age 22 if you are at a normal weight and have a low risk for diabetes.  More often and at a younger age if you are overweight or have a high risk for diabetes. What should I know about preventing infection? Hepatitis B If you have a higher risk for hepatitis B, you should be screened for this virus. Talk with your health care provider to find out if you are at risk for hepatitis B infection. Hepatitis C Testing is recommended for:  Everyone born from [birth year] through 1965.  Anyone with known risk factors for hepatitis C. Sexually transmitted infections (STIs)  Get screened for STIs, including gonorrhea and chlamydia, if: ? You are sexually active and are younger than 71 years of age. ? You are older than 71 years of age and your health care provider tells you that you are at risk for this type of infection. ? Your sexual activity has changed since you were last screened, and you are at increased risk for chlamydia or gonorrhea. Ask your health care provider if  you are at risk.  Ask your health care provider about whether you are at high risk for HIV. Your health care provider may recommend a prescription medicine to help prevent HIV infection. If you choose to take medicine to prevent HIV, you should first get tested for HIV. You should then be tested every 3 months for as long as you are taking the medicine. Pregnancy  If you are about to stop having your period (premenopausal) and you may become pregnant, seek counseling before you get pregnant.  Take 400 to 800 micrograms (mcg) of folic acid every day if you become pregnant.  Ask for birth control (contraception) if you want to prevent pregnancy. Osteoporosis and menopause Osteoporosis is a disease in which the bones lose minerals and strength with aging. This can result in bone fractures. If you are 2 years old or older, or if you are at risk for osteoporosis and  fractures, ask your health care provider if you should:  Be screened for bone loss.  Take a calcium or vitamin D supplement to lower your risk of fractures.  Be given hormone replacement therapy (HRT) to treat symptoms of menopause. Follow these instructions at home: Lifestyle  Do not use any products that contain nicotine or tobacco, such as cigarettes, e-cigarettes, and chewing tobacco. If you need help quitting, ask your health care provider.  Do not use street drugs.  Do not share needles.  Ask your health care provider for help if you need support or information about quitting drugs. Alcohol use  Do not drink alcohol if: ? Your health care provider tells you not to drink. ? You are pregnant, may be pregnant, or are planning to become pregnant.  If you drink alcohol: ? Limit how much you use to 0-1 drink a day. ? Limit intake if you are breastfeeding.  Be aware of how much alcohol is in your drink. In the [place] one drink equals one 12 oz bottle of beer (355 mL), one 5 oz glass of wine (148 mL), or one 1 oz glass of hard liquor (44 mL). General instructions  Schedule regular health, dental, and eye exams.  Stay current with your vaccines.  Tell your health care provider if: ? You often feel depressed. ? You have ever been abused or do not feel safe at home. Summary  Adopting a healthy lifestyle and getting preventive care are important in promoting health and wellness.  Follow your health care provider's instructions about healthy diet, exercising, and getting tested or screened for diseases.  Follow your health care provider's instructions on monitoring your cholesterol and blood pressure. This information is not intended to replace advice given to you by your health care provider. Make sure you discuss any questions you have with your health care provider. Document Released: 09/02/2010 Document Revised: 02/10/2018 Document Reviewed: 02/10/2018 Elsevier Patient  Education  2020 [employer].

## 2019-02-04 NOTE — Progress Notes (Signed)
This AWV is being conducted in-person at [facility]  Subjective:   [name] is a 73 y.o. female who presents for a Medicare Annual Wellness Visit.  The following items have been reviewed and updated today in the appropriate area in the EMR.   Health Risk Assessment  Height, weight, BMI, and BP Visual acuity if needed Depression screen Fall risk / safety level Advance directive discussion Medical and family history were reviewed and updated Updating list of other providers & suppliers Medication reconciliation, including over the counter medicines Cognitive screen Written screening schedule Risk Factor list Personalized health advice, risky behaviors, and treatment advice  Social History   Social History Narrative   Grew up in government housing. Received college education, had career Education officer, community, Veterinary surgeon, others), and own home before depression dx. Now back in government housing.    Started smoking in her teens and quit in her 65s.  She never smoked more than 1 pack/day.  Likely, less than a 20-pack-year history      Current Social History 02/04/2019        Patient lives alone in a ground floor apartment which is 1 story. There are not steps up to the entrance the patient uses.       Patient's method of transportation is personal car.      The highest level of education was Bachelor's Degree.      The patient currently retired/disabled.      Identified important Relationships are "I talk with my sister in [place] every night."       Pets : "I had to put down my 44.5 yo dog in July 2020."       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Raissa Dam, BSN, RN-BC             Objective:    Vitals: BP 106/67 (BP Location: Left Wrist, Patient Position: Sitting, Cuff Size: Normal)   Pulse 80   Ht 5\' 8"  (1.727 m)   Wt (!) 384 lb 4.8  oz (174.3 kg)   SpO2 95%   BMI 58.43 kg/m  Vitals are [facility] performed   O2 Sat at rest on RA = 95% O2 Sat on exertion on RM = 86% O2 Sat on exertion with 2 L O2 = 92% O2 Sat back at rest on 2L O2 = 99%  Activities of Daily Living In your present state of health, do you have any difficulty performing the following activities: 02/04/2019 10/14/2018  Hearing? N N  Vision? N N  Comment - -  Difficulty concentrating or making decisions? N Y  Walking or climbing stairs? 10/16/2018  Comment SHOB -  Dressing or bathing? Y Y  Doing errands, shopping? N Y  Comment - -  Some recent data might be hidden    Goals Goals    . Blood Pressure < 140/90    . Exercise 1x per week (10 min per time)     Sitting and standing exercises with exercise band    . Weight (lb) < 365 lb (165.6 kg) (pt-stated)     5% weight loss       Fall Risk Fall Risk  02/04/2019 10/14/2018 03/05/2018 02/04/2018 08/27/2017  Falls in the past year? 0 0 0 0 Yes  Comment - - - - -  Number falls in past yr: - - - - 1  Injury with Fall? - - - - No  Risk Factor Category  - - - - -  Risk for fall due to : Impaired balance/gait Impaired balance/gait;Impaired mobility - - Impaired balance/gait  Risk for fall due to: Comment - - - - -  Follow up Education provided;Falls prevention discussed Falls prevention discussed - - Falls prevention discussed   CDC Handout on Fall Prevention and Handout on Home Exercise Program, Access codes [email or web address] and [email or web address] given to patient with exercise band.    Depression Screen PHQ 2/9 Scores 02/04/2019 10/14/2018 07/02/2018 03/05/2018  PHQ - 2 Score 3 6 5 2   PHQ- 9 Score 9 20 17 9   Exception Documentation - - - -  Patient has weekly phone call with Peer Support and weekly zoom call with Support Group  Cognitive Testing Six-Item Cognitive Screener   "I would like to ask you some questions that ask you to use your memory. I am going to name three objects. Please wait until I say all three words, then  repeat them. Remember what they are  because I am going to ask you to name them again in a few minutes. Please repeat these words for me: APPLE-TABLE-PENNY." (Interviewer may repeat names 3 times if necessary but repetition not scored.)  Did patient correctly repeat all three words? Yes - may proceed with screen  What year is this? Correct What month is this? Correct What day of the week is this? Correct  What were the three objects I asked you to remember? . Apple Correct . Table Correct . Penny Correct  Score one point for each incorrect answer.  A score of 2 or more points warrants additional investigation.  Patient's score 0   Assessment and Plan:     PPD done today for [facility] at [facility] paperwork. Patient aware to return to clinic for PPD reading on 02/07/2019 before 11 AM A1c done today = 5.7 BMP drawn today as required prior to CT on 02/09/2019 Patient made a 5% weight loss goal. A referral has been placed to [facility] for help with this. She will begin seated and standing exercises with exercise band for 10 minutes at least once weekly.   During the course of the visit the patient was educated and counseled about appropriate screening and preventive services as documented in the assessment and plan.  The printed AVS was given to the patient and included an updated screening schedule, a list of risk factors, and personalized health advice.        Velora Heckler, RN  02/04/2019

## 2019-02-05 LAB — BMP8+ANION GAP
Anion Gap: 16 mmol/L (ref 10.0–18.0)
BUN/Creatinine Ratio: 15 (ref 12–28)
BUN: 20 mg/dL (ref 8–27)
CO2: 21 mmol/L (ref 20–29)
Calcium: 9.5 mg/dL (ref 8.7–10.3)
Chloride: 101 mmol/L (ref 96–106)
Creatinine, Ser: 1.36 mg/dL — ABNORMAL HIGH (ref 0.57–1.00)
GFR calc Af Amer: 45 mL/min/{1.73_m2} — ABNORMAL LOW (ref >59)
GFR calc non Af Amer: 39 mL/min/{1.73_m2} — ABNORMAL LOW (ref >59)
Glucose: 105 mg/dL — ABNORMAL HIGH (ref 65–99)
Potassium: 4.3 mmol/L (ref 3.5–5.2)
Sodium: 138 mmol/L (ref 134–144)

## 2019-02-07 ENCOUNTER — Telehealth: Payer: Self-pay | Admitting: *Deleted

## 2019-02-07 ENCOUNTER — Ambulatory Visit: Payer: Medicare HMO

## 2019-02-07 LAB — TB SKIN TEST
Induration: <0 mm
TB Skin Test: NEGATIVE

## 2019-02-07 NOTE — Telephone Encounter (Signed)
Pre-Residency Evaluation for [facility] completed. Placed in PCP's box for signature/date. Hubbard Hartshorn, BSN, RN-BC

## 2019-02-09 ENCOUNTER — Other Ambulatory Visit: Payer: Self-pay

## 2019-02-09 ENCOUNTER — Ambulatory Visit (HOSPITAL_COMMUNITY)
Admission: RE | Admit: 2019-02-09 | Discharge: 2019-02-09 | Disposition: A | Discharge status: Home / Self Care | Payer: Medicare HMO | Source: Ambulatory Visit | Attending: Internal Medicine | Admitting: Internal Medicine

## 2019-02-09 DIAGNOSIS — K760 Fatty (change of) liver, not elsewhere classified: Secondary | ICD-10-CM | POA: Diagnosis not present

## 2019-02-09 DIAGNOSIS — J432 Centrilobular emphysema: Secondary | ICD-10-CM | POA: Insufficient documentation

## 2019-02-09 DIAGNOSIS — E041 Nontoxic single thyroid nodule: Secondary | ICD-10-CM | POA: Diagnosis not present

## 2019-02-09 DIAGNOSIS — R918 Other nonspecific abnormal finding of lung field: Secondary | ICD-10-CM | POA: Diagnosis not present

## 2019-02-09 DIAGNOSIS — N289 Disorder of kidney and ureter, unspecified: Secondary | ICD-10-CM | POA: Diagnosis not present

## 2019-02-09 DIAGNOSIS — R140 Abdominal distension (gaseous): Secondary | ICD-10-CM | POA: Insufficient documentation

## 2019-02-09 DIAGNOSIS — R0902 Hypoxemia: Secondary | ICD-10-CM | POA: Diagnosis not present

## 2019-02-09 DIAGNOSIS — R0602 Shortness of breath: Secondary | ICD-10-CM | POA: Diagnosis not present

## 2019-02-09 NOTE — Telephone Encounter (Signed)
So she has O2 in home now? Thanks

## 2019-02-09 NOTE — Telephone Encounter (Signed)
Catron, Germain Osgood, Orvis Brill, RN; Catron, Stanford Breed, [place]; Fredderick Erb, Mamie C, [place]        got it

## 2019-02-09 NOTE — Telephone Encounter (Signed)
Community message sent to Jolee Ewing at [facility] that qualifying O2 sats were obtained at [facility] on 02/04/2019 and PCP added dx of Chronic hypoxemic respiratory failure to 01/20/2019 OV note. Hubbard Hartshorn, BSN, RN-BC

## 2019-02-11 NOTE — Telephone Encounter (Signed)
Catron, Germain Osgood, Orvis Brill, RN; Catron, Stanford Breed, [place]; Fredderick Erb, Mamie C, [place]  We called her to take her patient financial responsibility over the phone and she advised - Pt to call us back re: paying est pfr for the o2.   So we are waiting on the patient to call us back to pay her copayment.       Previous Messages   ----- Message -----  From: Velora Heckler, RN  Sent: 02/11/2019 11:09 AM EST  To: Darlina Guys, [place] Alas, *  Subject: RE: Home oxygen                  Hi Levada Dy,   Can you let me know if this patient's home oxygen has been set up? Thanks!

## 2019-02-13 DIAGNOSIS — I700 Atherosclerosis of aorta: Secondary | ICD-10-CM | POA: Insufficient documentation

## 2019-02-17 ENCOUNTER — Telehealth: Payer: Self-pay | Admitting: Internal Medicine

## 2019-02-17 DIAGNOSIS — R0902 Hypoxemia: Secondary | ICD-10-CM

## 2019-02-17 NOTE — Telephone Encounter (Signed)
Pt is requesting a call back about her CT results.  Please call pt back.

## 2019-02-21 ENCOUNTER — Ambulatory Visit: Payer: Medicare HMO | Admitting: Dietician

## 2019-02-21 NOTE — Patient Instructions (Addendum)
My goal for the next week:   Meal 1- smoothie/oatmeal, "something is better than nothing when trying to change a habit", soymilk original  Meal 2 and 3- Use whole bag of vegetables with whole pack  knorr's rice and two chicken legs and divide it half and eat half at 2-3 PM and the other between 6-8 PM  You can record on MyFitnessPal or any other program like this you decide to use - or a plan old paper or word document  Butch Penny [phone]

## 2019-02-21 NOTE — Progress Notes (Signed)
  Medical Nutrition Therapy :  Appt start time: 1120 end time:  1215. Total time: 55 minutes Visit # 1  02/21/2019 [name] [id number]  Assessment:  Primary concerns today: emphysema and help with using diet to reverse atherosclerotic calcifications.   [name] states that she does not cook much, she is on a limited budget and eats for emotonal reasons. She states that she eats large amounts of foods for emotional comfort. She states that she sees a Licensed conveyancer weekly. She reports recent loss of her 14 year old dog and the pandemic caused a 40# weight gain.  She is looking into assisted living to help herself eat more balanced meals and have assistance with her activities of daily living and to address her loneliness. She states that she gets out of breath walking around her apartment and is supposed to be getting oxygen soon.   Preferred Learning Style: No preference indicated  Learning Readiness: Contemplating/Ready  ANTHROPOMETRICS: she did not weigh today  Estimated body mass index is 58.43 kg/m as calculated from the following:   Height as of 02/04/19: 5\' 8"  (1.727 m).   Weight as of 02/04/19: 384 lb 4.8 oz (174.3 kg).  WEIGHT HISTORY:  Wt Readings from Last 5 Encounters:  02/04/19 (!) 384 lb 4.8 oz (174.3 kg)  01/20/19 (!) 382 lb 8 oz (173.5 kg)  10/14/18 (!) 384 lb 11.2 oz (174.5 kg)  03/05/18 (!) 350 lb 8 oz (159 kg)  02/04/18 (!) 350 lb 14.4 oz (159.2 kg)   SLEEP:she reports since death of her dog 5 months ago, she has been on a daytime sleeping schedule and up at night.  mostly during the day with CPAP even during times of wake  BP Readings from Last 3 Encounters:  02/04/19 106/67  01/20/19 (!) 124/48  10/14/18 114/79    MEDICATIONS: multivitamin daily, amlodipine, olmesartan, HCTZ DIETARY INTAKE: Usual eating pattern includes 1 meals and 0 snacks per day.  Need to assess: Nausea,  Vomiting, Diarrhea, Constipation, hair loss Dining Out  (times/week): 7 times in 2 weeks 24-hr recall:  D ( PM): fast foods x7 mrs winners chicken coleslaw and biscut and soda or at home chicken quarter skinless, a package of knorr's rice mix and half a bag of vegetables Beverages: water, juice recently 2 cups a day and occasionally regular soda   Progress Towards Goal(s):  In progress.   Nutritional Diagnosis:  NB-1.5 Disordered eating pattern As related to eating one meal a day and binging at other times  As evidenced by her report.    Intervention:  Nutrition education and counseling about importance of an eating pattern that includes more frequent eating times. Emphasized nutritional quality with increased whole grains, fiber, fruits vegetables lower sodium and moderate protein Action Goal: eat 2 times a day Outcome goal: improved control over eating binges, improved nutritional quality to improve weight and blood pressure Coordination of care: none at this time  Teaching Method Utilized: Visual, Auditory,Hands on Handouts given during visit include:after visit summary Barriers to learning/adherence to lifestyle change: Competing values Demonstrated degree of understanding via:  Teach Back   Monitoring/Evaluation:  Dietary intake, exercise, and body weight in 1 week(s)  Debera Lat, RD 02/21/2019 2:54 PM. .

## 2019-02-21 NOTE — Telephone Encounter (Signed)
CMA stopped by patient as she was completing her visit with dietician.  Pt requests a call back from pcp to discuss "a plan"  for her breathing.  Will route message to pcp to contact patient.  Despina Hidden Cassady12/21/202012:28 PM

## 2019-02-22 NOTE — Telephone Encounter (Signed)
Done. Pul referral entered.

## 2019-02-24 DIAGNOSIS — G4733 Obstructive sleep apnea (adult) (pediatric): Secondary | ICD-10-CM | POA: Diagnosis not present

## 2019-02-24 DIAGNOSIS — J9611 Chronic respiratory failure with hypoxia: Secondary | ICD-10-CM | POA: Diagnosis not present

## 2019-02-24 DIAGNOSIS — R0602 Shortness of breath: Secondary | ICD-10-CM | POA: Diagnosis not present

## 2019-02-24 DIAGNOSIS — R0682 Tachypnea, not elsewhere classified: Secondary | ICD-10-CM | POA: Diagnosis not present

## 2019-02-24 DIAGNOSIS — R05 Cough: Secondary | ICD-10-CM | POA: Diagnosis not present

## 2019-02-24 DIAGNOSIS — R0902 Hypoxemia: Secondary | ICD-10-CM | POA: Diagnosis not present

## 2019-02-24 DIAGNOSIS — Z86718 Personal history of other venous thrombosis and embolism: Secondary | ICD-10-CM | POA: Diagnosis not present

## 2019-02-24 DIAGNOSIS — R0603 Acute respiratory distress: Secondary | ICD-10-CM | POA: Diagnosis not present

## 2019-02-28 ENCOUNTER — Ambulatory Visit: Payer: Medicare HMO | Admitting: Dietician

## 2019-02-28 ENCOUNTER — Encounter: Payer: Self-pay | Admitting: Dietician

## 2019-02-28 NOTE — Progress Notes (Signed)
  Medical Nutrition Therapy :  Appt start time: 1120 end time:  1215. Total time: 55 minutes Visit # 2  02/28/2019 [name] [id number]  Assessment:  Primary concerns today: methods and information to assist her with desired weight loss   Ms Filice states that she got her oxygen and it is helping her move around her house more easily. Unfortunately, she did not know how to use the portable oxygen. She insisted on weighing today and had her coat  On. She states ice cream is a problem for her and she needs to get rid of itm but will not throw it away. She says she is eat what she has and not buy anymore. She wants to use her computer for food journaling rather than paper and pen.   ANTHROPOMETRICS: Estimated body mass index is 58.63 kg/m as calculated from the following:   Height as of 02/04/19: 5\' 8"  (1.727 m).   Weight as of this encounter: 385 lb 9.6 oz (174.9 kg).  WEIGHT HISTORY:  Wt Readings from Last 5 Encounters:  02/28/19 (!) 385 lb 9.6 oz (174.9 kg)  02/04/19 (!) 384 lb 4.8 oz (174.3 kg)  01/20/19 (!) 382 lb 8 oz (173.5 kg)  10/14/18 (!) 384 lb 11.2 oz (174.5 kg)  03/05/18 (!) 350 lb 8 oz (159 kg)   SLEEP:she struggles with daytime sleep and nighttime awake fullness. Uses CPAP to sleep  BP Readings from Last 3 Encounters:  02/04/19 106/67  01/20/19 (!) 124/48  10/14/18 114/79   DIETARY INTAKE: added extra vegetables to her knorr's rice mix and split it into two meals but was not satisfied.  Usual eating pattern includes 2 meals and 1-2 snacks per day.  Need to assess: Nausea,  Vomiting, Diarrhea, Constipation, hair loss   Progress Towards Goal(s):  In progress.   Nutritional Diagnosis:  NB-1.5 Disordered eating pattern As related to eating one meal a day and binging at other times  As evidenced by her report.    Intervention:  Nutrition education and counseling about importance of an eating pattern that includes more frequent eating times. Calorie and  portein needs for weight loss, how to use a hunger scale. Action Goal: eat 2 times a day Outcome goal: improved control over eating binges, improved nutritional quality to improve weight and blood pressure Coordination of care: none at this time  Teaching Method Utilized: Visual, Auditory,Hands on Handouts given during visit include:after visit summary Barriers to learning/adherence to lifestyle change: Competing values Demonstrated degree of understanding via:  Teach Back   Monitoring/Evaluation:  Dietary intake, exercise, and body weight in 2 week(s)  Debera Lat, RD 02/28/2019 4:31 PM. .

## 2019-02-28 NOTE — Patient Instructions (Addendum)
2200-2400 calories per day  each meal can be about 1/3 of this  Protein- 175 grams per day (165-185 for a range) (that is 700 calories)   Chicken leg quarter- 21-28 grams protein  700-800 calories per meal ???  To do for this week-  1- use computer program to keep up with nutrition- calories and protein- count everything to help keeps MINDFUL of what we are doing/eating.  2- use HUNGER Scale at least once daily  3- eat 3 meals each day spread out by 3-5 hours 2 PM 6 PM  10 PM  4- eggs are GREAT protein

## 2019-03-09 ENCOUNTER — Ambulatory Visit (INDEPENDENT_AMBULATORY_CARE_PROVIDER_SITE_OTHER): Payer: Medicare HMO | Admitting: Pulmonary Disease

## 2019-03-09 ENCOUNTER — Encounter: Payer: Self-pay | Admitting: Pulmonary Disease

## 2019-03-09 ENCOUNTER — Other Ambulatory Visit: Payer: Self-pay

## 2019-03-09 VITALS — BP 126/68 | HR 88 | Temp 97.2°F | Ht 68.0 in | Wt 376.0 lb

## 2019-03-09 DIAGNOSIS — I2729 Other secondary pulmonary hypertension: Secondary | ICD-10-CM

## 2019-03-09 DIAGNOSIS — J432 Centrilobular emphysema: Secondary | ICD-10-CM

## 2019-03-09 DIAGNOSIS — G4733 Obstructive sleep apnea (adult) (pediatric): Secondary | ICD-10-CM

## 2019-03-09 DIAGNOSIS — Z9989 Dependence on other enabling machines and devices: Secondary | ICD-10-CM

## 2019-03-09 DIAGNOSIS — J9611 Chronic respiratory failure with hypoxia: Secondary | ICD-10-CM | POA: Diagnosis not present

## 2019-03-09 MED ORDER — INCRUSE ELLIPTA 62.5 MCG/INH IN AEPB: 1.0000 | INHALATION_SPRAY | Freq: Every day | RESPIRATORY_TRACT | 0 refills | Status: DC

## 2019-03-09 NOTE — Progress Notes (Signed)
Subjective:   PATIENT ID: [name] GENDER: female DOB: 01-25-1946, MRN: [id number]   HPI  Chief Complaint  Patient presents with  . Consult    referred by PCP for hypoxemia.  pt previously seen by BQ in 2015 for pulm htn, b/l PE.     Reason for Visit: New consult for hypoxemia  [name] is a 74 year old female former smoker, history of recurrent pulmonary PE 2002 and 2015, OSA on CPAP on lifelong anticoagulation who presents with chronic hypoxemic respiratory failure.  She reports since the beginning of last year, she has had chronic dyspnea on exertion. She minimizes her physical activity due to this and spends a majority of her time in bed with her laptop. She is on a waiting list for a senior community where they have activities scheduled but for now she feels limited in options. She had a dog but she recently passed this summer so she is less motivated to get up. She reports she has a bedside commode due to urinary incontinence. She also keeps a chair in the kitchen because she has shortness of breath walking from her bedroom to the kitchen.  When she is bed, she is not short of breath so she does not wear oxygen. She was started on oxygen by her PCP after desattting to 84% on exertion. She wears 2L   She has a diagnosis of OSA and wears CPAP at night. She wears it for >4 hours a day. Her sleep is disjointed. She does not feel like her sleep is refreshing. She reports a 40lb weight in the last year. She reports concentrating is ok but attributes this to her depression.  Social History: Former smoker. Quit in 1986  I have personally reviewed patient's past medical/family/social history, allergies, current medications.  Past Medical History:  Diagnosis Date  . Arthritis    osteoarthritis , kness.  . Chronic kidney disease    Renal insuffucinecy, cr-1.33 in 10/2005.  [name] Depression    followed by Dr. Evelene Croon  . Diverticulosis of colon   . Fibrocystic  breast disease   . History of hepatitis B   . History of pulmonary embolism 01/2002  . Hyperlipidemia   . Hypertension   . Left shoulder pain    2/2 fall  . Neck nodule    not erythematous, movable like cyst located at the base of posterior neck on the left side(not painful), will need follow up for   . Obesity   . Obstructive sleep apnea    cpap   . Pulmonary nodule    Stable on repeat imaging. LLL nodule 5 mm     Family History  Problem Relation Age of Onset  . Hypertension Other        family history  . Kidney disease Maternal Uncle   . Parkinson's disease Brother   . Hypertension Mother      Social History   Occupational History  . Occupation: Retired  . Occupation: Disabled  Tobacco Use  . Smoking status: Former Smoker    Packs/day: 1.00    Years: 20.00    Pack years: 20.00    Types: Cigarettes    Quit date: 03/08/1984    Years since quitting: 35.0  . Smokeless tobacco: Never Used  Substance and Sexual Activity  . Alcohol use: No    Alcohol/week: 0.0 standard drinks  . Drug use: No  . Sexual activity: Not Currently    Allergies  Allergen Reactions  .  Lisinopril Cough     Outpatient Medications Prior to Visit  Medication Sig Dispense Refill  . ALPRAZolam (XANAX) 1 MG tablet Take 2 mg by mouth 3 (three) times daily as needed for anxiety or sleep.     [name] amLODipine (NORVASC) 10 MG tablet TAKE 1 TABLET EVERY DAY 90 tablet 1  . cyclobenzaprine (FLEXERIL) 5 MG tablet Take 1 tablet (5 mg total) by mouth daily as needed for muscle spasms. 20 tablet 0  . hydrochlorothiazide (HYDRODIURIL) 25 MG tablet TAKE 1 TABLET EVERY DAY 90 tablet 3  . mirabegron ER (MYRBETRIQ) 25 MG TB24 tablet Take 1 tablet (25 mg total) by mouth daily. 30 tablet   . Multiple Vitamin (MULTIVITAMIN) tablet Take 1 tablet by mouth daily. (Patient taking differently: Take 1 tablet by mouth every evening. ) 30 tablet 3  . olmesartan (BENICAR) 20 MG tablet TAKE 1 TABLET EVERY DAY 90 tablet 3  .  Omega-3 Fatty Acids (OMEGA-3 FISH OIL PO) Take 1 capsule by mouth every evening.     . potassium chloride (K-DUR) 10 MEQ tablet TAKE 1 TABLET TWICE DAILY 180 tablet 1  . pravastatin (PRAVACHOL) 40 MG tablet TAKE 1 TABLET EVERY DAY 90 tablet 3  . vortioxetine HBr (TRINTELLIX) 20 MG TABS tablet Take 20 mg by mouth every evening.     Alveda Reasons 20 MG TABS tablet TAKE 1 TABLET EVERY DAY WITH SUPPER 90 tablet 3   No facility-administered medications prior to visit.    Review of Systems  Constitutional: Positive for malaise/fatigue. Negative for chills, diaphoresis, fever and weight loss.  HENT: Negative for congestion, ear pain and sore throat.   Respiratory: Positive for cough and shortness of breath. Negative for hemoptysis, sputum production and wheezing.   Cardiovascular: Negative for chest pain, palpitations and leg swelling.  Gastrointestinal: Negative for abdominal pain, heartburn and nausea.  Genitourinary: Negative for frequency.  Musculoskeletal: Negative for joint pain and myalgias.  Skin: Negative for itching and rash.  Neurological: Negative for dizziness, weakness and headaches.  Endo/Heme/Allergies: Does not bruise/bleed easily.  Psychiatric/Behavioral: Negative for depression. The patient is not nervous/anxious.      Objective:   Vitals:   03/09/19 1334  BP: 126/68  Pulse: 88  Temp: (!) 97.2 F (36.2 C)  TempSrc: Temporal  SpO2: 97%  Weight: (!) 376 lb (170.6 kg)  Height: 5\' 8"  (1.727 m)   SpO2: 97 % O2 Device: None (Room air)  Physical Exam: General: Morbid obesity, well-appearing, no acute distress HENT: [place], AT Eyes: EOMI, no scleral icterus Respiratory: Clear to auscultation bilaterally.  No crackles, wheezing or rales Cardiovascular: RRR, -M/R/G, no JVD GI: BS+, soft, nontender Extremities:-Edema,-tenderness Neuro: AAO x4, CNII-XII grossly intact Skin: Intact, no rashes or bruising Psych: Normal mood, normal affect  Data Reviewed:  Imaging: CT  Chest 02/09/19 - Mild centrilobular emphysema  PFT: 02/01/19 FVC 2.43 (88%) FEV1 1.92 (92%) Ratio 80  TLC 85% DLCO 48% Interpretation: Moderately reduced gas exchange. No obstructive or restrictive lung defects.      Assessment & Plan:   Discussion: 74 year old female former smoker, history of recurrent pulmonary PE 2002 and 2015, OSA on CPAP on lifelong anticoagulation who presents with chronic hypoxemic respiratory failure. Her hypoxemia is likely multifactorial with uncontrolled OSA with a possible component of OHS despite CPAP compliance, emphysema and pulmonary hypertension.  Emphysema  START Incruse ONE puff ONCE a DAY  Chronic hypoxemic respiratory failure Wear your oxygen with ANY ACTIVITY Goal saturations greater than 88%  Obstructive  sleep apnea  CONTINUE your CPAP every night Wear for greater than 4 hours Avoid driving when sleepy We will arrange for sleep study (PAP titration study) to determine if you have adequate pressure  Pulmonary HTN Management as above   Health Maintenance Immunization History  Administered Date(s) Administered  . Influenza Split 12/25/2010  . Influenza Whole 01/15/2009, 11/26/2009  . Influenza, Seasonal, Injecte, Preservative Fre 01/27/2012  . Influenza,inj,Quad PF,6+ Mos 12/27/2012, 11/14/2013, 04/05/2015, 11/22/2015, 11/28/2016, 02/04/2018, 01/20/2019  . PPD Test 02/04/2019  . Pneumococcal Conjugate-13 07/27/2014  . Pneumococcal Polysaccharide-23 12/27/2012  . Td 01/15/2009   CT Lung Screen - not qualified  No orders of the defined types were placed in this encounter. No orders of the defined types were placed in this encounter.   Return in about 3 months (around 06/07/2019).  I have spent a total time of 45-minutes on the day of the appointment reviewing prior documentation, coordinating care and discussing medical diagnosis and plan with the patient/family. Imaging, labs and tests included in this note have been reviewed and  interpreted independently by me.  Mohd. Derflinger Mechele Collin, MD [facility] 03/09/2019 2:13 PM  Office Number [phone]

## 2019-03-09 NOTE — Patient Instructions (Signed)
Emphysema  START Incruse ONE puff ONCE a DAY  Chronic hypoxemic respiratory failure Wear your oxygen with ANY ACTIVITY Goal saturations greater than 88%  Obstructive sleep apnea  CONTINUE your CPAP every night Wear for greater than 4 hours Avoid driving when sleepy We will arrange for sleep study (PAP titration study) to determine if you have adequate pressure  Pulmonary HTN Management as above

## 2019-03-11 ENCOUNTER — Other Ambulatory Visit: Payer: Self-pay | Admitting: Student in an Organized Health Care Education/Training Program

## 2019-03-11 DIAGNOSIS — I10 Essential (primary) hypertension: Secondary | ICD-10-CM

## 2019-03-12 DIAGNOSIS — J432 Centrilobular emphysema: Secondary | ICD-10-CM | POA: Insufficient documentation

## 2019-03-12 DIAGNOSIS — I2729 Other secondary pulmonary hypertension: Secondary | ICD-10-CM | POA: Insufficient documentation

## 2019-03-14 ENCOUNTER — Encounter: Payer: Self-pay | Admitting: Dietician

## 2019-03-14 ENCOUNTER — Ambulatory Visit: Payer: Medicare HMO | Admitting: Dietician

## 2019-03-14 NOTE — Progress Notes (Signed)
  Medical Nutrition Therapy :  Appt start time: 1025 end time:  1200. Total time: 95 minutes Visit # 3  03/14/2019 [name] [id number]  Assessment:  Primary concerns today: methods and information to assist her with desired weight loss   [name]  Reports doing well with eating three times a day, is still working on recording her food intake, eating more protein and her sleep patterns. She reports eating at computer or in bed watching TV. She states ice cream is gone and she resisted temptation for buying more.  She continue with weekly counseling and support group for depression. Her weight trend indicates calorie deficit over the past 2 weeks.   ANTHROPOMETRICS: Estimated body mass index is 57.75 kg/m as calculated from the following:   Height as of 03/09/19: 5\' 8"  (1.727 m).   Weight as of this encounter: 379 lb 12.8 oz (172.3 kg).  WEIGHT HISTORY:  Wt Readings from Last 5 Encounters:  03/14/19 (!) 379 lb 12.8 oz (172.3 kg)  03/09/19 (!) 376 lb (170.6 kg)  02/28/19 (!) 385 lb 9.6 oz (174.9 kg)  02/04/19 (!) 384 lb 4.8 oz (174.3 kg)  01/20/19 (!) 382 lb 8 oz (173.5 kg)    DIETARY INTAKE: eating three times a day, splitting knorr's rice package and adding vegetables and protein to make it 2 meals.   Progress Towards Goal(s):  Some progress.   Nutritional Diagnosis:  NB-1.5 Disordered eating pattern As related to eating one meal a day and binging at other times improving As evidenced by her report of eating 3 times a day.    Intervention:  Nutrition education about protein needs for chronic kidney disease and weight loss; counseling about importance of an eating pattern that includes more frequent eating times, mindful eating practice, plate method of meal planning Action Goal: increase protein and vegetable intake,  Outcome goal: improved control over eating binges, improved nutritional quality to improve weight and blood pressure Coordination of care: none at this  time  Teaching Method Utilized: Visual, Auditory,Hands on Handouts given during visit include:after visit summary Barriers to learning/adherence to lifestyle change: Competing values Demonstrated degree of understanding via:  Teach Back   Monitoring/Evaluation:  Dietary intake, exercise, and body weight in 2 week(s)  01/22/19, RD 03/14/2019 1:58 PM. .

## 2019-03-14 NOTE — Patient Instructions (Addendum)
Find more vegetables to eat with protein meals when I donl;t feel like cooking.  Look into purchasing some protein powder to help supplement protein. Limmit this top 2 servings a day. If you decide to try to get protein from food- Higher in protein foods-   Soymilk, mackeral, eggs, beans, fish- catfish airfied ADD BEANS to rice mix and veggies Protein- can help with hunger- try to get it over 100 grams a day If you get to 165 grams per day.  Work to document what I eat.... a notebook to put label\s in????

## 2019-03-22 ENCOUNTER — Other Ambulatory Visit (HOSPITAL_COMMUNITY)
Admission: RE | Admit: 2019-03-22 | Discharge: 2019-03-22 | Disposition: A | Discharge status: Home / Self Care | Payer: Medicare HMO | Source: Ambulatory Visit | Attending: Internal Medicine | Admitting: Internal Medicine

## 2019-03-22 DIAGNOSIS — Z20822 Contact with and (suspected) exposure to covid-19: Secondary | ICD-10-CM | POA: Insufficient documentation

## 2019-03-22 DIAGNOSIS — Z01812 Encounter for preprocedural laboratory examination: Secondary | ICD-10-CM | POA: Diagnosis not present

## 2019-03-22 LAB — SARS CORONAVIRUS 2 (TAT 6-24 HRS): SARS Coronavirus 2: NEGATIVE

## 2019-03-24 ENCOUNTER — Other Ambulatory Visit: Payer: Self-pay

## 2019-03-24 ENCOUNTER — Ambulatory Visit (HOSPITAL_BASED_OUTPATIENT_CLINIC_OR_DEPARTMENT_OTHER): Payer: Medicare HMO | Attending: Pulmonary Disease | Admitting: Internal Medicine

## 2019-03-24 DIAGNOSIS — G4733 Obstructive sleep apnea (adult) (pediatric): Secondary | ICD-10-CM

## 2019-03-24 DIAGNOSIS — Z79899 Other long term (current) drug therapy: Secondary | ICD-10-CM | POA: Diagnosis not present

## 2019-03-24 DIAGNOSIS — Z7901 Long term (current) use of anticoagulants: Secondary | ICD-10-CM | POA: Insufficient documentation

## 2019-03-24 DIAGNOSIS — I2729 Other secondary pulmonary hypertension: Secondary | ICD-10-CM | POA: Insufficient documentation

## 2019-03-26 DIAGNOSIS — Z9989 Dependence on other enabling machines and devices: Secondary | ICD-10-CM

## 2019-03-26 DIAGNOSIS — G4733 Obstructive sleep apnea (adult) (pediatric): Secondary | ICD-10-CM

## 2019-03-26 NOTE — Procedures (Signed)
Patient Name: [name], [name] Date: 03/24/2019 Gender: Female D.O.B: [birth year] Age (years): 61 Referring Provider: Chi Mechele Collin Height (inches): 68 Interpreting Physician: Jetty Duhamel MD, ABSM Weight (lbs): 376 RPSGT: Shelah Lewandowsky BMI: 57 MRN: [id number] Neck Size: 19.00  CLINICAL INFORMATION The patient is referred for a CPAP titration to treat sleep apnea.  Date of NPSG, Split Night or HST:  NPSG 11/14/10  AHI 38.7/ hr, desaturation to 82%, body weight 335 lbs  SLEEP STUDY TECHNIQUE As per the AASM Manual for the Scoring of Sleep and Associated Events v2.3 (April 2016) with a hypopnea requiring 4% desaturations.  The channels recorded and monitored were frontal, central and occipital EEG, electrooculogram (EOG), submentalis EMG (chin), nasal and oral airflow, thoracic and abdominal wall motion, anterior tibialis EMG, snore microphone, electrocardiogram, and pulse oximetry. Continuous positive airway pressure (CPAP) was initiated at the beginning of the study and titrated to treat sleep-disordered breathing.  MEDICATIONS Medications self-administered by patient taken the night of the study : Xanax, cyclobenzaprine, Myrbetriq,  omega-3 fatty acid, KCL, pravastatin, Xarelto  TECHNICIAN COMMENTS Comments added by technician: NONE Comments added by scorer: N/A RESPIRATORY PARAMETERS Optimal PAP Pressure (cm): 12 AHI at Optimal Pressure (/hr): 0.0 Overall Minimal O2 (%): 88.0 Supine % at Optimal Pressure (%): 28 Minimal O2 at Optimal Pressure (%): 88.0   SLEEP ARCHITECTURE The study was initiated at 11:27:06 PM and ended at 5:36:17 AM.  Sleep onset time was 14.5 minutes and the sleep efficiency was 52.4%%. The total sleep time was 193.5 minutes.  The patient spent 20.2%% of the night in stage N1 sleep, 74.4%% in stage N2 sleep, 0.0%% in stage N3 and 5.4% in REM.Stage REM latency was 151.0 minutes  Wake after sleep onset was 161.2. Alpha intrusion was  absent. Supine sleep was 19.12%.  CARDIAC DATA The 2 lead EKG demonstrated sinus rhythm. The mean heart rate was 72.8 beats per minute. Other EKG findings include: PVCs.  LEG MOVEMENT DATA The total Periodic Limb Movements of Sleep (PLMS) were 0. The PLMS index was 0.0. A PLMS index of <15 is considered normal in adults.  IMPRESSIONS - The optimal PAP pressure was 12 cm of water.  BiPAP was not required for conttrol. - Central sleep apnea was not noted during this titration (CAI = 0.3/h). - Mild oxygen desaturations were observed during this titration (min O2 = 88.0%). Mean O2 sat on CPAP 12 was 90.7% - The patient snored with moderate snoring volume during this titration study. - 2-lead EKG demonstrated: PVCs - Clinically significant periodic limb movements were not noted during this study.  DIAGNOSIS - Obstructive Sleep Apnea (327.23 [G47.33 ICD-10])  RECOMMENDATIONS - Trial of CPAP therapy on 12 cm H2O or autopap 5-15. - Patient used a Medium Wide size Philips Respironics Full Face Mask Dreamwear mask and heated humidification. - Be careful with alcohol, sedatives and other CNS depressants that may worsen sleep apnea and disrupt normal sleep architecture. - Sleep hygiene should be reviewed to assess factors that may improve sleep quality. - Weight management and regular exercise should be initiated or continued.  [Electronically signed] 03/26/2019 12:28 PM  Jetty Duhamel MD, ABSM Diplomate, American Board of Sleep Medicine   NPI: [id number]                         Jetty Duhamel Diplomate, American Board of Sleep Medicine  ELECTRONICALLY SIGNED ON:  03/26/2019, 12:20 PM [facility] PH: [phone]  FX: [phone] [place]

## 2019-03-27 DIAGNOSIS — R05 Cough: Secondary | ICD-10-CM | POA: Diagnosis not present

## 2019-03-27 DIAGNOSIS — R0603 Acute respiratory distress: Secondary | ICD-10-CM | POA: Diagnosis not present

## 2019-03-27 DIAGNOSIS — J9611 Chronic respiratory failure with hypoxia: Secondary | ICD-10-CM | POA: Diagnosis not present

## 2019-03-27 DIAGNOSIS — Z86718 Personal history of other venous thrombosis and embolism: Secondary | ICD-10-CM | POA: Diagnosis not present

## 2019-03-27 DIAGNOSIS — R0902 Hypoxemia: Secondary | ICD-10-CM | POA: Diagnosis not present

## 2019-03-27 DIAGNOSIS — R0602 Shortness of breath: Secondary | ICD-10-CM | POA: Diagnosis not present

## 2019-03-27 DIAGNOSIS — R0682 Tachypnea, not elsewhere classified: Secondary | ICD-10-CM | POA: Diagnosis not present

## 2019-03-27 DIAGNOSIS — G4733 Obstructive sleep apnea (adult) (pediatric): Secondary | ICD-10-CM | POA: Diagnosis not present

## 2019-03-28 ENCOUNTER — Encounter: Payer: Self-pay | Admitting: Dietician

## 2019-03-28 ENCOUNTER — Ambulatory Visit (INDEPENDENT_AMBULATORY_CARE_PROVIDER_SITE_OTHER): Payer: Medicare HMO | Admitting: Dietician

## 2019-03-28 ENCOUNTER — Other Ambulatory Visit: Payer: Self-pay | Admitting: Internal Medicine

## 2019-03-28 DIAGNOSIS — Z6841 Body Mass Index (BMI) 40.0 and over, adult: Secondary | ICD-10-CM | POA: Diagnosis not present

## 2019-03-28 DIAGNOSIS — N182 Chronic kidney disease, stage 2 (mild): Secondary | ICD-10-CM | POA: Diagnosis not present

## 2019-03-28 DIAGNOSIS — G4733 Obstructive sleep apnea (adult) (pediatric): Secondary | ICD-10-CM

## 2019-03-28 DIAGNOSIS — Z713 Dietary counseling and surveillance: Secondary | ICD-10-CM

## 2019-03-28 NOTE — Progress Notes (Signed)
°  Medical Nutrition Therapy:   Appt start time: 1015 end time:  1130. Total time: 45 minutes Visit # 4  03/28/2019 DAMONICA CHOPRA [id number]  Assessment:  Primary concerns today: methods and information to assist with her desired weight loss. Pt reports past week has been very stressful, so it was difficult to keep on track with her foods. She stated she was able to eat 2 meals most days and even 3 meals some days. States she has often been snacking on grapes, but had difficulty with eating vegetables.  ANTHROPOMETRICS:  Wt Readings from Last 5 Encounters:  03/28/19 (!) 380 lb 4.8 oz (172.5 kg)  03/24/19 (!) 376 lb (170.6 kg)  03/14/19 (!) 379 lb 12.8 oz (172.3 kg)  03/09/19 (!) 376 lb (170.6 kg)  02/28/19 (!) 385 lb 9.6 oz (174.9 kg)   Height: 5'8 BMI: 57.84 SLEEP: Had a sleep study conducted last Thursday, 03/24/19  DIETARY INTAKE: Usual eating pattern includes 2-3 meals and 0 snacks per day. Everyday foods include banana bread english muffins, chicken sandwiches.   Dining Out (times/week): 3/week  Usual physical activity: None  Progress Towards Goal(s):  Some progress.   Nutritional Diagnosis:  NB-1.5 Disordered eating pattern As related to eating one meal a day and binging at other times improving As evidenced by her report of eating 3 times a day.    Intervention:  Nutrition: Changed previous recommendations to be more realistic for a stressful/busy lifestyle Action Goal: Record 1 meal in the next 2 weeks  Outcome goal: Gain confidence and make progress towards consistent meal tracking Coordination of care: None  Teaching Method Utilized: Visual, Auditory, Hands on Handouts given during visit include: Hunger-Fullness Scale Barriers to learning/adherence to lifestyle change: Life stressors Demonstrated degree of understanding via:  Verbalization   Monitoring/Evaluation:  Dietary intake and body weight in 2 week(s)   Norma Fredrickson - Dietetic Intern

## 2019-03-28 NOTE — Progress Notes (Signed)
  Medical Nutrition Therapy :  Appt start time: 1025 end time:  1200. Total time: 95 minutes Visit # 3  03/28/2019 [name] [id number]  Assessment:  Primary concerns today: methods and information to assist her with desired weight loss   [name]  Reports doing well with eating three times a day, is still working on recording her food intake, eating more protein and her sleep patterns. She reports eating at computer or in bed watching TV. She states ice cream is gone and she resisted temptation for buying more.  She continue with weekly counseling and support group for depression. Her weight trend indicates calorie deficit over the past 2 weeks.   ANTHROPOMETRICS: Estimated body mass index is 57.82 kg/m as calculated from the following:   Height as of 03/24/19: 5\' 8"  (1.727 m).   Weight as of this encounter: 380 lb 4.8 oz (172.5 kg).  WEIGHT HISTORY:  Wt Readings from Last 5 Encounters:  03/28/19 (!) 380 lb 4.8 oz (172.5 kg)  03/24/19 (!) 376 lb (170.6 kg)  03/14/19 (!) 379 lb 12.8 oz (172.3 kg)  03/09/19 (!) 376 lb (170.6 kg)  02/28/19 (!) 385 lb 9.6 oz (174.9 kg)    DIETARY INTAKE: eating three times a day, splitting knorr's rice package and adding vegetables and protein to make it 2 meals.   Progress Towards Goal(s):  Some progress.   Nutritional Diagnosis:  NB-1.5 Disordered eating pattern As related to eating one meal a day and binging at other times improving As evidenced by her report of eating 3 times a day.    Intervention:  Nutrition education about protein needs for chronic kidney disease and weight loss; counseling about importance of an eating pattern that includes more frequent eating times, mindful eating practice, plate method of meal planning Action Goal: increase protein and vegetable intake,  Outcome goal: improved control over eating binges, improved nutritional quality to improve weight and blood pressure Coordination of care: none at this  time  Teaching Method Utilized: Visual, Auditory,Hands on Handouts given during visit include:after visit summary Barriers to learning/adherence to lifestyle change: Competing values Demonstrated degree of understanding via:  Teach Back   Monitoring/Evaluation:  Dietary intake, exercise, and body weight in 2 week(s)  03/02/19, RD 03/28/2019 11:22 AM. .

## 2019-03-28 NOTE — Patient Instructions (Addendum)
New goal this week: record one meal in myfitnessPal  In the next 2 weeks.  I like your ideas of finding something  To eat in place of store bought subs.   Lupita Leash [phone]

## 2019-03-28 NOTE — Progress Notes (Signed)
I do not know if she needs new CPAP equipment but I put in order anyway

## 2019-03-29 ENCOUNTER — Encounter: Payer: Medicare HMO | Admitting: Dietician

## 2019-03-29 NOTE — Progress Notes (Addendum)
Ms. Boyson reported not seeing her counselor for the past two weeks. She also reported being distracted from her goals by something she is having a hard time coping with. Attempted to assist her with exploring her ambivalence for wanting to lose weight and keep food records, but having a hard time achieving that goal. She reports less vegetables due to her stresses and more salt intake and convenience foods. She thinks her calorie intake is <2000 calories per day. Her eating pattern is showing improvement as she is eating 2-3 meals most days. Encourage resumption of counseling to assist her with coping with stressors and depression.  I reviewed and approve this note and the plan. Norm Parcel, RD 03/29/2019 2:02 PM.

## 2019-04-05 ENCOUNTER — Telehealth: Payer: Self-pay

## 2019-04-05 DIAGNOSIS — G4733 Obstructive sleep apnea (adult) (pediatric): Secondary | ICD-10-CM

## 2019-04-05 NOTE — Telephone Encounter (Signed)
-----   Message from Chi Mechele Collin, MD sent at 03/30/2019  4:04 PM EST ----- Please arrange for patient to have sleep supplies including autopap 5-15cm H20. She has a CPAP device however it sounds old. She previously used [place]. Schedule follow-up with me in March or April with CPAP compliance report.

## 2019-04-11 ENCOUNTER — Other Ambulatory Visit: Payer: Self-pay | Admitting: Dietician

## 2019-04-11 ENCOUNTER — Ambulatory Visit (INDEPENDENT_AMBULATORY_CARE_PROVIDER_SITE_OTHER): Payer: Medicare HMO | Admitting: Dietician

## 2019-04-11 ENCOUNTER — Encounter: Payer: Self-pay | Admitting: Dietician

## 2019-04-11 DIAGNOSIS — N182 Chronic kidney disease, stage 2 (mild): Secondary | ICD-10-CM

## 2019-04-11 DIAGNOSIS — Z713 Dietary counseling and surveillance: Secondary | ICD-10-CM

## 2019-04-11 DIAGNOSIS — Z6841 Body Mass Index (BMI) 40.0 and over, adult: Secondary | ICD-10-CM | POA: Diagnosis not present

## 2019-04-11 NOTE — Progress Notes (Signed)
Referral request

## 2019-04-11 NOTE — Progress Notes (Signed)
.   Medical Nutrition Therapy:   Appt start time: 2583 end time:  1100 Total time: 45 minutes Visit # 3  04/11/2019 DACIE MANDEL [id number]  Assessment:  Primary concerns today: methods and information to assist with her desired weight loss. Pt reports past week has been very stressful, but this week shuld be better.  She met her goal for food tracking as she journaled in My fitness pal for > 7 days. She discussed what she learned from food journaling.  She ate 2 meals most days and even 3 meals some days.  Hr weigh tis decrease by 2#.   ANTHROPOMETRICS:  Wt Readings from Last 5 Encounters:  04/11/19 (!) 378 lb 9.6 oz (171.7 kg)  03/28/19 (!) 380 lb 4.8 oz (172.5 kg)  03/24/19 (!) 376 lb (170.6 kg)  03/14/19 (!) 379 lb 12.8 oz (172.3 kg)  03/09/19 (!) 376 lb (170.6 kg)   SLEEP: is taking a new sleep medicine from her psychiatrist. plans to ask about new CPAP at her appointment next week and is learning how important sleep is.  DIETARY INTAKE: Usual eating pattern includes 2-3 meals and 0-2 snacks per day. Seems to eats snacks when she eats less meals.  Everyday foods include whole grain english muffins, raisins, frozen vegetables, chicken, subs Her journaling averages of her intake for 7 days is:  Calories: 1650 Protein: 72g Sodium:3265  Usual physical activity: None  Progress Towards Goal(s):  Some progress.   Nutritional Diagnosis:  NB-1.5 Disordered eating pattern As related to eating one meal a day and binging at other times improving As evidenced by her report of emotional eating and eating 2-3 times a day.    Intervention:  Nutrition education about reviewing records to help make appropriate changes: how we make food decisions, how fiber can heklp with fullness. how more nutrient dense (potassium and fiber) may help.  Changed previous recommendations to be more realistic per her request Action Goal: Record >7 days intake in the next 10 days; increase fiber  Outcome  goal: Gain confidence and make progress towards consistent meal tracking Coordination of care: None  Teaching Method Utilized: Visual, Auditory, Hands on Handouts given during visit include: Hunger-Fullness Scale Barriers to learning/adherence to lifestyle change: Life stressors Demonstrated degree of understanding via:  Verbalization   Monitoring/Evaluation:  Dietary intake and body weight 10 days Debera Lat, RD 04/11/2019 4:50 PM.

## 2019-04-11 NOTE — Progress Notes (Signed)
Done

## 2019-04-11 NOTE — Patient Instructions (Signed)
To summarize:  Fantastic job recording your food intake- this is super important to learning about ourselves.   This week keep working on recording-   Try to increase your fiber to >20 grams a day (can change in myfitnesspal to help you track it)   Ways to increase fiber and potassium  1- fiber supplement before meals two times a day 2- fruits- 2 cups a day 3- vegetables- 3 cups a day 4- whole grains 5- nuts, seeds, and beans (Beans soup freeze well)

## 2019-04-21 ENCOUNTER — Ambulatory Visit: Payer: Medicare HMO | Admitting: Internal Medicine

## 2019-04-21 ENCOUNTER — Ambulatory Visit: Payer: Medicare HMO | Admitting: Dietician

## 2019-04-21 ENCOUNTER — Encounter: Payer: Medicare HMO | Admitting: Dietician

## 2019-04-21 DIAGNOSIS — G4733 Obstructive sleep apnea (adult) (pediatric): Secondary | ICD-10-CM | POA: Diagnosis not present

## 2019-04-27 DIAGNOSIS — R0902 Hypoxemia: Secondary | ICD-10-CM | POA: Diagnosis not present

## 2019-04-27 DIAGNOSIS — R05 Cough: Secondary | ICD-10-CM | POA: Diagnosis not present

## 2019-04-27 DIAGNOSIS — Z86718 Personal history of other venous thrombosis and embolism: Secondary | ICD-10-CM | POA: Diagnosis not present

## 2019-04-27 DIAGNOSIS — G4733 Obstructive sleep apnea (adult) (pediatric): Secondary | ICD-10-CM | POA: Diagnosis not present

## 2019-04-27 DIAGNOSIS — R0603 Acute respiratory distress: Secondary | ICD-10-CM | POA: Diagnosis not present

## 2019-04-27 DIAGNOSIS — J9611 Chronic respiratory failure with hypoxia: Secondary | ICD-10-CM | POA: Diagnosis not present

## 2019-04-27 DIAGNOSIS — R0682 Tachypnea, not elsewhere classified: Secondary | ICD-10-CM | POA: Diagnosis not present

## 2019-04-27 DIAGNOSIS — R0602 Shortness of breath: Secondary | ICD-10-CM | POA: Diagnosis not present

## 2019-05-11 ENCOUNTER — Telehealth: Payer: Self-pay | Admitting: Dietician

## 2019-05-12 NOTE — Telephone Encounter (Signed)
Communicated with Ms. Booton about her food diary. She thinks she has lost weight. She recorded for 35 days straight. She was commended on this achievement.  Follow up via MyFitnesspal and at her appointment on 05/26/19 Norm Parcel, RD 05/12/2019 2:14 PM.

## 2019-05-19 ENCOUNTER — Encounter: Payer: Medicare HMO | Admitting: Dietician

## 2019-05-19 ENCOUNTER — Encounter: Payer: Medicare HMO | Admitting: Internal Medicine

## 2019-05-19 DIAGNOSIS — G4733 Obstructive sleep apnea (adult) (pediatric): Secondary | ICD-10-CM | POA: Diagnosis not present

## 2019-05-25 DIAGNOSIS — G4733 Obstructive sleep apnea (adult) (pediatric): Secondary | ICD-10-CM | POA: Diagnosis not present

## 2019-05-25 DIAGNOSIS — R0603 Acute respiratory distress: Secondary | ICD-10-CM | POA: Diagnosis not present

## 2019-05-25 DIAGNOSIS — R0602 Shortness of breath: Secondary | ICD-10-CM | POA: Diagnosis not present

## 2019-05-25 DIAGNOSIS — Z86718 Personal history of other venous thrombosis and embolism: Secondary | ICD-10-CM | POA: Diagnosis not present

## 2019-05-25 DIAGNOSIS — R0682 Tachypnea, not elsewhere classified: Secondary | ICD-10-CM | POA: Diagnosis not present

## 2019-05-25 DIAGNOSIS — R05 Cough: Secondary | ICD-10-CM | POA: Diagnosis not present

## 2019-05-25 DIAGNOSIS — R0902 Hypoxemia: Secondary | ICD-10-CM | POA: Diagnosis not present

## 2019-05-25 DIAGNOSIS — J9611 Chronic respiratory failure with hypoxia: Secondary | ICD-10-CM | POA: Diagnosis not present

## 2019-05-26 ENCOUNTER — Other Ambulatory Visit: Payer: Self-pay

## 2019-05-26 ENCOUNTER — Ambulatory Visit (INDEPENDENT_AMBULATORY_CARE_PROVIDER_SITE_OTHER): Payer: Medicare HMO | Admitting: Internal Medicine

## 2019-05-26 ENCOUNTER — Encounter: Payer: Self-pay | Admitting: Internal Medicine

## 2019-05-26 ENCOUNTER — Encounter: Payer: Self-pay | Admitting: Dietician

## 2019-05-26 ENCOUNTER — Ambulatory Visit (INDEPENDENT_AMBULATORY_CARE_PROVIDER_SITE_OTHER): Payer: Medicare HMO | Admitting: Dietician

## 2019-05-26 VITALS — BP 115/69 | HR 79 | Temp 99.3°F | Ht 68.0 in | Wt 365.9 lb

## 2019-05-26 DIAGNOSIS — J9611 Chronic respiratory failure with hypoxia: Secondary | ICD-10-CM

## 2019-05-26 DIAGNOSIS — Z6841 Body Mass Index (BMI) 40.0 and over, adult: Secondary | ICD-10-CM | POA: Diagnosis not present

## 2019-05-26 DIAGNOSIS — I10 Essential (primary) hypertension: Secondary | ICD-10-CM | POA: Diagnosis not present

## 2019-05-26 DIAGNOSIS — E041 Nontoxic single thyroid nodule: Secondary | ICD-10-CM | POA: Diagnosis not present

## 2019-05-26 DIAGNOSIS — F322 Major depressive disorder, single episode, severe without psychotic features: Secondary | ICD-10-CM | POA: Diagnosis not present

## 2019-05-26 DIAGNOSIS — Z86711 Personal history of pulmonary embolism: Secondary | ICD-10-CM

## 2019-05-26 DIAGNOSIS — Z7901 Long term (current) use of anticoagulants: Secondary | ICD-10-CM

## 2019-05-26 DIAGNOSIS — E785 Hyperlipidemia, unspecified: Secondary | ICD-10-CM

## 2019-05-26 DIAGNOSIS — Z0000 Encounter for general adult medical examination without abnormal findings: Secondary | ICD-10-CM

## 2019-05-26 DIAGNOSIS — Z713 Dietary counseling and surveillance: Secondary | ICD-10-CM

## 2019-05-26 DIAGNOSIS — N182 Chronic kidney disease, stage 2 (mild): Secondary | ICD-10-CM

## 2019-05-26 DIAGNOSIS — Z86718 Personal history of other venous thrombosis and embolism: Secondary | ICD-10-CM

## 2019-05-26 DIAGNOSIS — Z79899 Other long term (current) drug therapy: Secondary | ICD-10-CM

## 2019-05-26 NOTE — Patient Instructions (Addendum)
Wt Readings from Last 10 Encounters:  05/26/19 (!) 365 lb 11.2 oz (165.9 kg)  04/11/19 (!) 378 lb 9.6 oz (171.7 kg)  03/28/19 (!) 380 lb 4.8 oz (172.5 kg)  03/24/19 (!) 376 lb (170.6 kg)  03/14/19 (!) 379 lb 12.8 oz (172.3 kg)  03/09/19 (!) 376 lb (170.6 kg)  02/28/19 (!) 385 lb 9.6 oz (174.9 kg)  02/04/19 (!) 384 lb 4.8 oz (174.3 kg)  01/20/19 (!) 382 lb 8 oz (173.5 kg)  10/14/18 (!) 384 lb 11.2 oz (174.5 kg)   You have lost about 7# every month, 1-2 pounds every week- which is right on target!!!  Aim for 20 or more grams fiber per day- add fruit daily and veggies for more fiber and vitamins.   I will message  you through Myfitnesspal   sodium recommendation is about 2000 mg a day. We can talk more later about this later  Lupita Leash

## 2019-05-26 NOTE — Patient Instructions (Signed)
1. We offer Chronic Care Management [facility]) who can help with housing, medication, transportation needs. Call clinic and ask to be referred

## 2019-05-26 NOTE — Progress Notes (Addendum)
Medical Nutrition Therapy:   Appt start time: 915 end time:  1000 Total time: 45 minutes Visit # 4  05/26/2019 [name] [id number]  Assessment: [name] is food journal using myfitnesspal regularly now (45 days straight)  showing consumption of less than 1500 calrories daily on a regular basis. This has resulted in 20# weight loss since December. Her rate of loss is appropriate at 1-2 # per week x 12 weeks. She continues to attend her depression support group.   ANTHROPOMETRICS: Estimated body mass index is 55.6 kg/m as calculated from the following:   Height as of 03/24/19: 5\' 8"  (1.727 m).   Weight as of this encounter: 365 lb 11.2 oz (165.9 kg).   Wt Readings from Last 10 Encounters:  05/26/19 (!) 365 lb 11.2 oz (165.9 kg)  04/11/19 (!) 378 lb 9.6 oz (171.7 kg)  03/28/19 (!) 380 lb 4.8 oz (172.5 kg)  03/24/19 (!) 376 lb (170.6 kg)  03/14/19 (!) 379 lb 12.8 oz (172.3 kg)  03/09/19 (!) 376 lb (170.6 kg)  02/28/19 (!) 385 lb 9.6 oz (174.9 kg)  02/04/19 (!) 384 lb 4.8 oz (174.3 kg)  01/20/19 (!) 382 lb 8 oz (173.5 kg)  10/14/18 (!) 384 lb 11.2 oz (174.5 kg)   SLEEP: sleeping better with new CPAP  DIETARY INTAKE: Usual eating pattern includes 2 meals and 0-2 snacks per day.  Everyday foods include whole grain english muffins, raisins, frozen vegetables, chicken, subs. Using protein powder in water with one meal a day. Uses psyllium husk occassionally Her journaling averages of her intake for 4 days is:  Calories: 1238 Protein: 72g Sodium:2100   Usual physical activity: using bands and doing leg exercises regularly  Progress Towards Goal(s):  Some progress.   Nutritional Diagnosis:  NB-1.5 Disordered eating pattern As related to eating one meal a day and binging at other times improving As evidenced by her report of emotional eating and eating 2 times a day.    Intervention:  Nutrition education and counseling about reviewing records to help make appropriate  changes: hunger cues, feelings associated with food, importance of structure and mind full eating . how more nutrient dense (potassium, vitamins and minerals may help.  Changed previous recommendations to be more realistic per her request Action Goal: Increase fiber, fruit    Outcome goal: Gain confidence and continue progress towards consistent meal tracking, long tern weight loss Coordination of care: None  Teaching Method Utilized: Visual, Auditory, Hands on Handouts given during visit include: Hunger-Fullness Scale Barriers to learning/adherence to lifestyle change: Life stressors Demonstrated degree of understanding via:  Verbalization   Monitoring/Evaluation:  Dietary intake and body weight 4 weeks 10/16/18, RD 05/26/2019 10:27 AM.

## 2019-05-27 ENCOUNTER — Encounter: Payer: Self-pay | Admitting: Dietician

## 2019-05-27 NOTE — Progress Notes (Signed)
   Subjective:    Patient ID: [name], female    DOB: 1945-04-25, 74 y.o.   MRN: [id number]  HPI  [name] is here for HTN FU. Please see the A&P for the status of the pt's chronic medical problems.  ROS : per ROS section and in problem oriented charting. All other systems are negative.  PMHx, Soc hx, and / or Fam hx : The housing issue has not yet been settled and is now in the hands of a lawyer  Review of Systems  Constitutional:       Expected weight loss  Psychiatric/Behavioral:       Slept 8 continuous hours on 2 separate nights       Objective:   Physical Exam Constitutional:      General: She is not in acute distress.    Appearance: Normal appearance. She is obese. She is not ill-appearing or diaphoretic.  HENT:     Head: Normocephalic and atraumatic.     Right Ear: External ear normal.     Left Ear: External ear normal.  Eyes:     General: No scleral icterus.       Right eye: No discharge.        Left eye: No discharge.     Conjunctiva/sclera: Conjunctivae normal.  Neurological:     General: No focal deficit present.     Mental Status: She is alert. Mental status is at baseline.  Psychiatric:        Mood and Affect: Mood normal.        Behavior: Behavior normal.        Thought Content: Thought content normal.        Judgment: Judgment normal.       Assessment & Plan:

## 2019-05-30 ENCOUNTER — Ambulatory Visit: Payer: Medicare HMO | Attending: Internal Medicine

## 2019-05-30 DIAGNOSIS — Z23 Encounter for immunization: Secondary | ICD-10-CM

## 2019-05-30 NOTE — Assessment & Plan Note (Signed)
This problem is chronic and stable.  She continues to see her psychiatrist in support group.  She states her psychiatrist added a sleeping medicine that started with the letters "do."  Doxepin 10 mg is now on her outside medication epic report.  She also remains on Xanax and Trintellix per psychiatry.  PLAN : Follow pysch's recs

## 2019-05-30 NOTE — Assessment & Plan Note (Signed)
She remains on Xarelto without any side effects to this medication.

## 2019-05-30 NOTE — Assessment & Plan Note (Signed)
This problem is chronic and well controlled.  He is on amlodipine 10 mg, HCTZ 25, and Benicar 20.  Her initial blood pressure was low at 100/59.  She was asymptomatic.  I was worried with her weight loss, I might have to back down on her antihypertensives.  We rechecked her blood pressure and it was improved.  PLAN:  Cont current meds   BP Readings from Last 3 Encounters:  05/26/19 115/69  03/09/19 126/68  02/04/19 106/67

## 2019-05-30 NOTE — Assessment & Plan Note (Signed)
This problem is chronic and uncontrolled.  I reviewed the pulmonologist's note who felt her hypoxic respiratory failure was multifactorial.  She felt [name] had early stage emphysema and prescribed Incruse.  [name] says that the Incruse did not help and that she had been told if it did not help she could stop it so she has not been taking it.  Her CT scan does support emphysematous changes but her PFTs showed an FEV1 over FVC ratio of 80% and an FEV1 94% predicted.  It was also thought to be due to OSA and the pulmonologist arranged for a titration study and a new CPAP machine.  [name] states that she had 2 nights where she was able to sleep 8 hours and likes the new CPAP machine.  Pulmonary hypertension is another etiology thought to be contributing.  For her chronic hypoxic respiratory failure, the pulmonologist rec using oxygen with any activity and keeping her O2 sat greater than 88%.  [name] uses 2 L and does not have a home pulse osx.  PLAN : O2 by nasal cannula CPAP Continue weight loss

## 2019-05-30 NOTE — Progress Notes (Signed)
   Covid-19 Vaccination Clinic  Name:  [name]    MRN: [id number] DOB: [birth year]  05/30/2019  [name] was observed post Covid-19 immunization for 15 minutes without incident. She was provided with Vaccine Information Sheet and instruction to access the V-Safe system.   [name] was instructed to call 911 with any severe reactions post vaccine: [name] Difficulty breathing  . Swelling of face and throat  . A fast heartbeat  . A bad rash all over body  . Dizziness and weakness   Immunizations Administered    Name Date Dose VIS Date Route   Pfizer COVID-19 Vaccine 05/30/2019  2:45 PM 0.3 mL 02/11/2019 Intramuscular   Manufacturer: [employer], [employer]   Lot: [id number]   NDC: [address]-0

## 2019-05-30 NOTE — Assessment & Plan Note (Signed)
This problem is chronic and improved.  She is working with [name] who has [name] focusing on caloric intake only.  She is successfully losing weight. She weighted 385 at the end of December 2020 and is now 39.  She has done an amazing job managing her weight.  PLAN : Continue to work with Angela Cox Readings from Last 3 Encounters:  05/26/19 (!) 365 lb 14.4 oz (166 kg)  05/26/19 (!) 365 lb 11.2 oz (165.9 kg)  04/11/19 (!) 378 lb 9.6 oz (171.7 kg)

## 2019-05-30 NOTE — Assessment & Plan Note (Signed)
New problem Her CT scan had revealed a 17 mm posterior right thyroid nodule which had progressed mildly since 2017.  We reviewed her prior CT scans which did not mention a thyroid nodule.  We discussed thyroid ultrasound and she was in agreement to get this test done and an order was placed.  PLAN : Thyroid ultrasound

## 2019-05-30 NOTE — Assessment & Plan Note (Addendum)
She remains on atorvastatin 40 mg, a mod intensity statin, for primary prevention.  She has no side effects to this medication.

## 2019-06-06 ENCOUNTER — Ambulatory Visit (INDEPENDENT_AMBULATORY_CARE_PROVIDER_SITE_OTHER): Payer: Medicare HMO | Admitting: Pulmonary Disease

## 2019-06-06 ENCOUNTER — Other Ambulatory Visit: Payer: Self-pay

## 2019-06-06 ENCOUNTER — Encounter: Payer: Self-pay | Admitting: Pulmonary Disease

## 2019-06-06 VITALS — BP 98/58 | HR 88 | Temp 97.1°F | Ht 68.0 in | Wt 364.4 lb

## 2019-06-06 DIAGNOSIS — J432 Centrilobular emphysema: Secondary | ICD-10-CM

## 2019-06-06 DIAGNOSIS — Z9989 Dependence on other enabling machines and devices: Secondary | ICD-10-CM

## 2019-06-06 DIAGNOSIS — R0602 Shortness of breath: Secondary | ICD-10-CM | POA: Diagnosis not present

## 2019-06-06 DIAGNOSIS — R5381 Other malaise: Secondary | ICD-10-CM | POA: Diagnosis not present

## 2019-06-06 DIAGNOSIS — G4733 Obstructive sleep apnea (adult) (pediatric): Secondary | ICD-10-CM

## 2019-06-06 NOTE — Progress Notes (Signed)
Subjective:   PATIENT ID: [name] GENDER: female DOB: 1946/01/15, MRN: [id number]   HPI  Chief Complaint  Patient presents with  . Follow-up   Reason for Visit: Follow-up  [name] is a 74 year old female former smoker, hx of recurrent pulmonary PE in 2002 and 2015 on anticoagulation and OSA on CPAP who presents for follow-up.  On our last visit on 03/09/19, we started Incruse and ordered PAP titration to manage and work-up her dyspnea and chronic hypoxemic respiratory failure. Reviewed sleep study from 03/24/19 by Dr. Annamaria Boots which recommended CPAP 12 cm H20 of autopap 5-15 cm H20. Supplies were ordered.  She reports compliance with her CPAP nightly, wearing it >4 hours. Her sleep has quality has improved and she feels better rested. She has not been taking her Incruse as it was not effective. She has intentionally lost 20lbs since our last visit and feels that her dyspnea overall is somewhat improved though she has chronic dyspnea with any activity including walking across the room. She is compliant with her home oxygen but admits she spends nearly 20 hours in bed. She does exercises in bed including bike pedaling and will sit to use her computer on occasion. She is still waiting to be place in a senior community center where they have opportunity for more activities. Her current community only celebrates birthdays and bingo.  Social History: Former smoker. Quit in 1986  I have personally reviewed patient's past medical/family/social history/allergies/current medications.  Past Medical History:  Diagnosis Date  . Arthritis    osteoarthritis , kness.  . Chronic kidney disease    Renal insuffucinecy, cr-1.33 in 10/2005.  [name] Depression    followed by Dr. Toy Care  . Diverticulosis of colon   . Fibrocystic breast disease   . History of hepatitis B   . History of pulmonary embolism 01/2002  . Hyperlipidemia   . Hypertension   . Left shoulder pain    2/2 fall  .  Neck nodule    not erythematous, movable like cyst located at the base of posterior neck on the left side(not painful), will need follow up for   . Obesity   . Obstructive sleep apnea    cpap   . Pulmonary nodule    Stable on repeat imaging. LLL nodule 5 mm    Outpatient Medications Prior to Visit  Medication Sig Dispense Refill  . ALPRAZolam (XANAX) 1 MG tablet Take 2 mg by mouth 3 (three) times daily as needed for anxiety or sleep.     [name] amLODipine (NORVASC) 10 MG tablet TAKE 1 TABLET EVERY DAY 90 tablet 3  . cyclobenzaprine (FLEXERIL) 5 MG tablet Take 1 tablet (5 mg total) by mouth daily as needed for muscle spasms. 20 tablet 0  . hydrochlorothiazide (HYDRODIURIL) 25 MG tablet TAKE 1 TABLET EVERY DAY 90 tablet 3  . mirabegron ER (MYRBETRIQ) 25 MG TB24 tablet Take 1 tablet (25 mg total) by mouth daily. 30 tablet   . Multiple Vitamin (MULTIVITAMIN) tablet Take 1 tablet by mouth daily. (Patient taking differently: Take 1 tablet by mouth every evening. ) 30 tablet 3  . olmesartan (BENICAR) 20 MG tablet TAKE 1 TABLET EVERY DAY 90 tablet 3  . Omega-3 Fatty Acids (OMEGA-3 FISH OIL PO) Take 1 capsule by mouth every evening.     . potassium chloride (KLOR-CON) 10 MEQ tablet TAKE 1 TABLET TWICE DAILY 180 tablet 3  . pravastatin (PRAVACHOL) 40 MG tablet TAKE 1 TABLET EVERY  DAY 90 tablet 3  . umeclidinium bromide (INCRUSE ELLIPTA) 62.5 MCG/INH AEPB Inhale 1 puff into the lungs daily. 2 each 0  . vortioxetine HBr (TRINTELLIX) 20 MG TABS tablet Take 20 mg by mouth every evening.     Carlena Hurl 20 MG TABS tablet TAKE 1 TABLET EVERY DAY WITH SUPPER 90 tablet 3   No facility-administered medications prior to visit.    Review of Systems  Constitutional: Negative for chills, diaphoresis, fever, malaise/fatigue and weight loss.  HENT: Negative for congestion.   Respiratory: Positive for shortness of breath. Negative for cough, hemoptysis, sputum production and wheezing.   Cardiovascular: Negative for  chest pain, palpitations and leg swelling.     Objective:   Vitals:   06/06/19 1342  BP: (!) 98/58  Pulse: 88  Temp: (!) 97.1 F (36.2 C)  TempSrc: Temporal  SpO2: 97%  Weight: (!) 364 lb 6.4 oz (165.3 kg)  Height: 5\' 8"  (1.727 m)     Physical Exam: General: Morbidly obese, well-appearing, no acute distress HENT: [place], AT Eyes: EOMI, no scleral icterus Respiratory: Clear to auscultation bilaterally.  No crackles, wheezing or rales Cardiovascular: RRR, -M/R/G, no JVD Extremities:-Edema,-tenderness Neuro: AAO x4, CNII-XII grossly intact Skin: Intact, no rashes or bruising Psych: Normal mood, normal affect  Data Reviewed:  Imaging: CT Chest 02/09/19 - Mild centrilobular emphysema. Tiny bilateral pulmonary nodules unchanged since 2017, likely benign  PFT: 02/01/19 FVC 2.43 (88%) FEV1 1.92 (92%) Ratio 80  TLC 85% DLCO 48% Interpretation: Moderately reduced gas exchange. No obstructive or restrictive lung defects.   PAP titration 03/24/19 Hx severe OSA AHI 38.7 Optimal PAP pressure 12 cmH20. BiPAP not required Dx OSA Recommendations - CPAP therapy on 12 cm H20 on autopap 5-15  CPAP Compliance report 05/07/19-06/05/19 100% usage days 100% >4h  Assessment & Plan:   Discussion: 74 year old female former smoker with emphysema, chronic hypoxemic respiratory failure, hx of recurrent pulmonary PE in 2002 and 2015 on anticoagulation and OSA on CPAP who presents for follow-up. She has chronic dyspnea that is improved after better control of her OSA and ambulatory O2 requirement has improved. However chronic dyspnea remains persistent likely due to severe deconditioning (>20 hours in bed per patient self-report).  Shortness of breath Emphysema  DISCONTINUE Incruse Patient declined albuterol inhaler ENCOURAGE REGULAR EXERCISE at home. We discussed Pulmonary rehab however patient declined at this time. I have recommended getting up and out of bed every hour to walk across the house and  to continue to increase the distance and duration of walking as long as tolerated.  Chronic hypoxemic respiratory failure - improved Congratulations you graduated from needing oxygen! Only need to wear oxygen if your levels drop below 88%  Severe obstructive sleep apnea --Counseled on sleep hygiene --Counseled on weight loss/maintenance of healthy weight --Counseled NOT to drive if/when sleepy --Advised patient to wear CPAP for at least 4 hours each night for greater than 70% of the time to avoid the machine being repossessed by insurance.  Pulmonary HTN Management as above   Health Maintenance Immunization History  Administered Date(s) Administered  . Influenza Split 12/25/2010  . Influenza Whole 01/15/2009, 11/26/2009  . Influenza, Seasonal, Injecte, Preservative Fre 01/27/2012  . Influenza,inj,Quad PF,6+ Mos 12/27/2012, 11/14/2013, 04/05/2015, 11/22/2015, 11/28/2016, 02/04/2018, 01/20/2019  . PFIZER SARS-COV-2 Vaccination 05/30/2019  . PPD Test 02/04/2019  . Pneumococcal Conjugate-13 07/27/2014  . Pneumococcal Polysaccharide-23 12/27/2012  . Td 01/15/2009   CT Lung Screen - not qualified  No orders of the defined  types were placed in this encounter. No orders of the defined types were placed in this encounter.  Return in about 6 months (around 12/06/2019).  Dion Parrow Mechele Collin, MD [facility] 06/06/2019 11:43 AM  Office Number [phone]

## 2019-06-06 NOTE — Patient Instructions (Signed)
Shortness of breath Emphysema  DISCONTINUE Incruse Patient declined albuterol inhaler ENCOURAGE REGULAR EXERCISE at home. We discussed Pulmonary rehab however patient declined at this time. I have recommended getting up and out of bed every hour to walk across the house and to continue to increase the distance and duration of walking as long as tolerated.  Chronic hypoxemic respiratory failure - improved Congratulations you graduated from needing oxygen! Only need to wear oxygen if your levels drop below 88%  Severe obstructive sleep apnea --Counseled on sleep hygiene --Counseled on weight loss/maintenance of healthy weight --Counseled NOT to drive if/when sleepy --Advised patient to wear CPAP for at least 4 hours each night for greater than 70% of the time to avoid the machine being repossessed by insurance.

## 2019-06-10 ENCOUNTER — Telehealth: Payer: Self-pay | Admitting: Dietician

## 2019-06-10 NOTE — Telephone Encounter (Signed)
Called Ms. Scheuring to provide support for continued food journaling and weight loss.

## 2019-06-15 NOTE — Telephone Encounter (Signed)
Reviewed her food records on Myfitness pal. encouraging lower sodium, more fruits and vegetables.

## 2019-06-19 DIAGNOSIS — G4733 Obstructive sleep apnea (adult) (pediatric): Secondary | ICD-10-CM | POA: Diagnosis not present

## 2019-06-20 ENCOUNTER — Ambulatory Visit (HOSPITAL_COMMUNITY)
Admission: RE | Admit: 2019-06-20 | Discharge: 2019-06-20 | Disposition: A | Discharge status: Home / Self Care | Payer: Medicare HMO | Source: Ambulatory Visit | Attending: Internal Medicine | Admitting: Internal Medicine

## 2019-06-20 ENCOUNTER — Other Ambulatory Visit: Payer: Self-pay

## 2019-06-20 DIAGNOSIS — E041 Nontoxic single thyroid nodule: Secondary | ICD-10-CM | POA: Insufficient documentation

## 2019-06-21 ENCOUNTER — Encounter: Payer: Self-pay | Admitting: Internal Medicine

## 2019-06-21 DIAGNOSIS — E042 Nontoxic multinodular goiter: Secondary | ICD-10-CM | POA: Insufficient documentation

## 2019-06-22 ENCOUNTER — Ambulatory Visit: Payer: Medicare HMO | Attending: Internal Medicine

## 2019-06-22 DIAGNOSIS — Z23 Encounter for immunization: Secondary | ICD-10-CM

## 2019-06-22 NOTE — Progress Notes (Signed)
   Covid-19 Vaccination Clinic  Name:  [name]    MRN: [id number] DOB: [birth year]  06/22/2019  [name] was observed post Covid-19 immunization for 15 minutes without incident. She was provided with Vaccine Information Sheet and instruction to access the V-Safe system.   [name] was instructed to call 911 with any severe reactions post vaccine: [name] Difficulty breathing  . Swelling of face and throat  . A fast heartbeat  . A bad rash all over body  . Dizziness and weakness   Immunizations Administered    Name Date Dose VIS Date Route   Pfizer COVID-19 Vaccine 06/22/2019  2:52 PM 0.3 mL 04/27/2018 Intramuscular   Manufacturer: [employer], [employer]   Lot: [id number]   NDC: [address]-9

## 2019-06-23 ENCOUNTER — Ambulatory Visit (INDEPENDENT_AMBULATORY_CARE_PROVIDER_SITE_OTHER): Payer: Medicare HMO | Admitting: Dietician

## 2019-06-23 DIAGNOSIS — N182 Chronic kidney disease, stage 2 (mild): Secondary | ICD-10-CM

## 2019-06-23 DIAGNOSIS — Z713 Dietary counseling and surveillance: Secondary | ICD-10-CM | POA: Diagnosis not present

## 2019-06-23 DIAGNOSIS — Z6841 Body Mass Index (BMI) 40.0 and over, adult: Secondary | ICD-10-CM | POA: Diagnosis not present

## 2019-06-23 NOTE — Progress Notes (Addendum)
  Medical Nutrition Therapy:   Appt start time: 925 end time:  1015 Total time: 50 minutes Visit # 5  06/23/2019 [name] [id number]  Assessment: [name] is using myfitnesspal regularly now (45 days straight)  showing consumption of less than 1500 calrories daily on a regular basis. This has resulted in 22.5# weight loss since December. She has not increased her fruit, but recently bought some dried fruit to start eating to meet this goal. She is aware of sodium in foods, reads labels and of her goal for sodium.    ANTHROPOMETRICS: Estimated body mass index is 55.12 kg/m as calculated from the following:   Height as of 06/06/19: 5\' 8"  (1.727 m).   Weight as of this encounter: 362 lb 8 oz (164.4 kg).   Wt Readings from Last 10 Encounters:  06/23/19 (!) 362 lb 8 oz (164.4 kg)  06/06/19 (!) 364 lb 6.4 oz (165.3 kg)  05/26/19 (!) 365 lb 14.4 oz (166 kg)  05/26/19 (!) 365 lb 11.2 oz (165.9 kg)  04/11/19 (!) 378 lb 9.6 oz (171.7 kg)  03/28/19 (!) 380 lb 4.8 oz (172.5 kg)  03/24/19 (!) 376 lb (170.6 kg)  03/14/19 (!) 379 lb 12.8 oz (172.3 kg)  03/09/19 (!) 376 lb (170.6 kg)  02/28/19 (!) 385 lb 9.6 oz (174.9 kg)   SLEEP: not discussed today  DIETARY INTAKE: Usual eating pattern includes 2 meals and 0-2 snacks per day.  Everyday foods include whole grain english muffins, raisins, frozen vegetables, chicken, subs. Using protein powder in water with one meal a day. Uses psyllium husk occassionally Her journaling averages of her intake is:  Calories: ~ 1600 Fiber: ~25 Fruit servings- 0 Protein: ~100g Sodium:2400+   Usual physical activity: using bands and doing leg exercises regularly, thinking about moving more  Progress Towards Goal(s):  Some progress.   Nutritional Diagnosis:  NB-1.5 Disordered eating pattern As related to eating one meal a day and binging at other times improving As evidenced by her report of less  emotional eating and eating most days 2 times.     Intervention:  Nutrition education and counseling about reviewing records to help make appropriate changes: hunger cues, feelings associated with food, importance of structure and mind full eating . how more nutrient dense (potassium, vitamins and minerals may help.  Changed previous recommendations to be more realistic per her request Action Goal: Increase fruit   And vegetables  Outcome goal: Gain confidence and continue progress towards consistent meal tracking, long term weight loss Coordination of care: notify Dr. 03/02/19 of blood pressure values  Teaching Method Utilized: Visual, Auditory, Hands on Handouts given during visit include:  After visit summary Barriers to learning/adherence to lifestyle change: depression and Life stressors Demonstrated degree of understanding via:  Verbalization   Monitoring/Evaluation:  Dietary intake and body weight 3 weeks Rogelia Boga, RD 06/23/2019 6:08 PM.

## 2019-06-23 NOTE — Patient Instructions (Addendum)
I'll get with Dr. Rogelia Boga about your blood pressure.   Great job increasing your fruit intake by buying fruit at [employer]. Watch out for the small portions sizes with dried fruits.  I commend you for working on increasing your physical activity- this will help with keeping the weight off long term.  Start with small goals then increase to goal of 150 minutes a week.  Average Step length for women 25 inches per steps- 30 inches for men.   Please make a follow up in 3 weeks  Lupita Leash [phone]

## 2019-06-24 ENCOUNTER — Other Ambulatory Visit: Payer: Self-pay | Admitting: Dietician

## 2019-06-24 ENCOUNTER — Encounter: Payer: Self-pay | Admitting: Dietician

## 2019-06-24 ENCOUNTER — Telehealth: Payer: Self-pay | Admitting: *Deleted

## 2019-06-24 ENCOUNTER — Other Ambulatory Visit: Payer: Self-pay | Admitting: Internal Medicine

## 2019-06-24 DIAGNOSIS — N182 Chronic kidney disease, stage 2 (mild): Secondary | ICD-10-CM

## 2019-06-24 NOTE — Telephone Encounter (Signed)
-----   Message from [name], MD sent at 06/24/2019  9:38 AM EDT ----- Regarding: RE: blood pressure & 2nd referral in same calendar year Her systolic was in the 90s when I saw her but improved to the 115 range after recheck.  She was asymptomatic when I saw her.  However, she continues to lose weight.  Rather than waiting for her to get symptoms of hypotension, please ask her to stop her HCTZ.  She will continue her ARB and amlodipine.Does she has a way to monitor her blood pressure at home?  My goal would be 130/80 or less.  I do not know if you are comfortable doing this Lupita Leash?  I also CCed triage.  Thank you ----- Message ----- From: Plyler, Cecil Cranker, RD Sent: 06/23/2019   6:18 PM EDT To: [name], MD Subject: blood pressure & 2nd referral in same calend#  Hi Dr. Rogelia Boga,   Ms Diebold wanted me to check her blood pressure today because it has been ruing low at home- today was 92/54 in office.   Also- may I have a 2nd referral in same calendar year for her? I am happy to enter and route to you for your signature if you'd like.   Thank you!  Lupita Leash

## 2019-06-24 NOTE — Telephone Encounter (Signed)
Called pt: Stop hctz, agreeable and read back to nurse Will monitor BP 2x daily, will call on a weekly basis UNLESS bp 100/50 or less or above 130/80, she read back and was agreeable, otherwise she will call next Friday with weekly readings Informed dr Midwife by phone

## 2019-06-25 DIAGNOSIS — R0603 Acute respiratory distress: Secondary | ICD-10-CM | POA: Diagnosis not present

## 2019-06-25 DIAGNOSIS — R05 Cough: Secondary | ICD-10-CM | POA: Diagnosis not present

## 2019-06-25 DIAGNOSIS — R0602 Shortness of breath: Secondary | ICD-10-CM | POA: Diagnosis not present

## 2019-06-25 DIAGNOSIS — G4733 Obstructive sleep apnea (adult) (pediatric): Secondary | ICD-10-CM | POA: Diagnosis not present

## 2019-06-25 DIAGNOSIS — R0682 Tachypnea, not elsewhere classified: Secondary | ICD-10-CM | POA: Diagnosis not present

## 2019-06-25 DIAGNOSIS — R0902 Hypoxemia: Secondary | ICD-10-CM | POA: Diagnosis not present

## 2019-06-25 DIAGNOSIS — J9611 Chronic respiratory failure with hypoxia: Secondary | ICD-10-CM | POA: Diagnosis not present

## 2019-06-25 DIAGNOSIS — Z86718 Personal history of other venous thrombosis and embolism: Secondary | ICD-10-CM | POA: Diagnosis not present

## 2019-06-27 ENCOUNTER — Other Ambulatory Visit: Payer: Self-pay | Admitting: Internal Medicine

## 2019-06-29 ENCOUNTER — Other Ambulatory Visit: Payer: Self-pay | Admitting: Internal Medicine

## 2019-07-06 ENCOUNTER — Other Ambulatory Visit: Payer: Self-pay | Admitting: Internal Medicine

## 2019-07-07 NOTE — Telephone Encounter (Signed)
Medication was stopped due to hypotension

## 2019-07-07 NOTE — Telephone Encounter (Signed)
Received refill request for hctz mg daily-medication no longer on list and had been previously discontinued by pcp on  06/24/2019 secondary to weight loss cause low blood pressure (see 4/23 telephone encounter).  Will send request to pcp for review, please advise.Criss Alvine, Mandolin Falwell Cassady5/6/20219:08 AM

## 2019-07-14 ENCOUNTER — Encounter: Payer: Medicare HMO | Admitting: Dietician

## 2019-07-14 ENCOUNTER — Ambulatory Visit (INDEPENDENT_AMBULATORY_CARE_PROVIDER_SITE_OTHER): Payer: Medicare HMO | Admitting: Dietician

## 2019-07-14 ENCOUNTER — Encounter: Payer: Self-pay | Admitting: Dietician

## 2019-07-14 DIAGNOSIS — N182 Chronic kidney disease, stage 2 (mild): Secondary | ICD-10-CM

## 2019-07-14 NOTE — Patient Instructions (Addendum)
Goals for the next 2-3 weeks  1- keep recording food intake - calories, protein fiber, sodium   2- Work on increasing physical - try to walk 22 minutes a day ( 150 minutes a week)   Follow up: June 3rd at 315PM  Lupita Leash [phone]

## 2019-07-14 NOTE — Progress Notes (Addendum)
Medical Nutrition Therapy:   Appt start time: 1446 end time:  1517 Total time:31 minutes Visit # 6  Assessment: [name] is using myfitnesspal regularly  showing consumption of less than 1600 calrories daily on a regular basis. This has resulted in 16# weight loss over 5 months. She has consistently been eating 2 meals a day and sometimes 3, consul;ing increased fiber and dereased sodium. Her protein intake is increased for weight loss and is appropriate.  She is aware of sodium in foods, reads labels and of her goal for sodium.    ANTHROPOMETRICS: Estimated body mass index is 54.86 kg/m as calculated from the following:   Height as of 06/06/19: 5\' 8"  (1.727 m).   Weight as of this encounter: 360 lb 12.8 oz (163.7 kg).   Wt Readings from Last 10 Encounters:  07/14/19 (!) 360 lb 12.8 oz (163.7 kg)  06/23/19 (!) 362 lb 8 oz (164.4 kg)  06/06/19 (!) 364 lb 6.4 oz (165.3 kg)  05/26/19 (!) 365 lb 14.4 oz (166 kg)  05/26/19 (!) 365 lb 11.2 oz (165.9 kg)  04/11/19 (!) 378 lb 9.6 oz (171.7 kg)  03/28/19 (!) 380 lb 4.8 oz (172.5 kg)  03/24/19 (!) 376 lb (170.6 kg)  03/14/19 (!) 379 lb 12.8 oz (172.3 kg)  03/09/19 (!) 376 lb (170.6 kg)   SLEEP: not discussed today  DIETARY INTAKE: Recording consistently using My fitmess PAL, sh is maintaing ~ 1600 calories per day,  Usual eating pattern includes 2 meals and 0-2 snacks per day.  Everyday foods include whole grain english muffins, raisins, frozen vegetables, chicken, subs. Using protein powder in water with one meal a day. Uses psyllium husk occassionally Her journaling averages of her intake is:  Calories: ~ 1600 Fiber: ~25 Fruit servings- 0 Protein: ~100g Sodium:2400+   Usual physical activity: using bands and doing leg exercises regularly and tryign to get 05/07/19 and move every hour per her pulmonologist recommendation.   Progress Towards Goal(s):  Some progress.   Nutritional Diagnosis:  NB-1.5 Disordered eating pattern As related to  eating one meal a day and binging at other times continues to improve As evidenced by her report of less  emotional eating and eating most days 2 times.    Intervention:  Nutrition education and counseling about reviewing records to help make appropriate changes: education of reasons to increase physical activity.  Action Goal: continue to log and add activity  Outcome goal: Gain confidence and continue progress towards consistent meal and activity tracking, long term weight loss Coordination of care: none  Teaching Method Utilized: Visual, Auditory, Hands on Handouts given during visit include:  After visit summary Barriers to learning/adherence to lifestyle change: depression and Life stressors Demonstrated degree of understanding via:  Verbalization   Monitoring/Evaluation:  Dietary intake and body weight 3 weeks [place], RD 07/20/2019 9:50 AM.

## 2019-07-19 DIAGNOSIS — G4733 Obstructive sleep apnea (adult) (pediatric): Secondary | ICD-10-CM | POA: Diagnosis not present

## 2019-07-20 ENCOUNTER — Encounter: Payer: Self-pay | Admitting: Dietician

## 2019-07-25 DIAGNOSIS — Z86718 Personal history of other venous thrombosis and embolism: Secondary | ICD-10-CM | POA: Diagnosis not present

## 2019-07-25 DIAGNOSIS — R0902 Hypoxemia: Secondary | ICD-10-CM | POA: Diagnosis not present

## 2019-07-25 DIAGNOSIS — R0603 Acute respiratory distress: Secondary | ICD-10-CM | POA: Diagnosis not present

## 2019-07-25 DIAGNOSIS — R05 Cough: Secondary | ICD-10-CM | POA: Diagnosis not present

## 2019-07-25 DIAGNOSIS — J9611 Chronic respiratory failure with hypoxia: Secondary | ICD-10-CM | POA: Diagnosis not present

## 2019-07-25 DIAGNOSIS — R0602 Shortness of breath: Secondary | ICD-10-CM | POA: Diagnosis not present

## 2019-07-25 DIAGNOSIS — R0682 Tachypnea, not elsewhere classified: Secondary | ICD-10-CM | POA: Diagnosis not present

## 2019-07-25 DIAGNOSIS — G4733 Obstructive sleep apnea (adult) (pediatric): Secondary | ICD-10-CM | POA: Diagnosis not present

## 2019-08-09 ENCOUNTER — Telehealth: Payer: Self-pay | Admitting: Dietician

## 2019-08-09 ENCOUNTER — Ambulatory Visit (INDEPENDENT_AMBULATORY_CARE_PROVIDER_SITE_OTHER): Payer: Medicare HMO | Admitting: Dietician

## 2019-08-09 DIAGNOSIS — N182 Chronic kidney disease, stage 2 (mild): Secondary | ICD-10-CM

## 2019-08-09 NOTE — Telephone Encounter (Signed)
[name] wants to know what she has to do to get a rollator (walker with a seat) to help her be more mobile. She also wants to know if she still needs oxygen as she is not using oxygen as much anymore and never while in her house.  Thirdly she wants her doctor to know her Blood pressure was 107/67 this am and has only been taking 1 sleeping pill at night so thinks it may have been the sleeping medicine that caused her blood pressure to be low.

## 2019-08-09 NOTE — Patient Instructions (Addendum)
The 3 A's Awarenss,  Acceptance and  Action   It was pleasure seeing you today.   [name] [phone]

## 2019-08-09 NOTE — Progress Notes (Signed)
Medical Nutrition Therapy:   Appt start time: 1515 end time:  1615 Total time:60 minutes Visit # 7  Assessment: [name] was using myfitnesspal regularly showing consumption of less than 1600 calrories daily until the past 1-2 weeks. She reports awareness of a depressive episode starting by seeing her food log. She continue to attend weekly therapy and group support for depression.  She feels she eats for emotional reasons during this time rather than hunger or physical reasons.  Despite this her weight has still decreased over the past month.   ANTHROPOMETRICS: Estimated body mass index is 54.65 kg/m as calculated from the following:   Height as of 06/06/19: 5\' 8"  (1.727 m).   Weight as of this encounter: 359 lb 6.4 oz (163 kg).   Wt Readings from Last 10 Encounters:  08/09/19 (!) 359 lb 6.4 oz (163 kg)  07/14/19 (!) 360 lb 12.8 oz (163.7 kg)  06/23/19 (!) 362 lb 8 oz (164.4 kg)  06/06/19 (!) 364 lb 6.4 oz (165.3 kg)  05/26/19 (!) 365 lb 14.4 oz (166 kg)  05/26/19 (!) 365 lb 11.2 oz (165.9 kg)  04/11/19 (!) 378 lb 9.6 oz (171.7 kg)  03/28/19 (!) 380 lb 4.8 oz (172.5 kg)  03/24/19 (!) 376 lb (170.6 kg)  03/14/19 (!) 379 lb 12.8 oz (172.3 kg)   SLEEP: not feeling rested despite more than adequate rest; wears CPAP  DIETARY INTAKE: Recording consistently using My fitmess PAL, increased to >2300 calories per day for several days recently. Protein was reasonable, sodium continues to be in target most days  Usual physical activity: part of her deressive feeling stemmed from feeling that she was unable to do the physical activity that was asked of her.   Progress Towards Goal(s):  Some progress.   Nutritional Diagnosis:  NB-1.5 Disordered eating pattern As related to eating one meal a day and binging at other times continues to improve As evidenced by her report of less  emotional eating and eating most days 2 times.    Intervention:  Nutrition education and counseling about reviewing  records to help make appropriate changes. The 3 As of behavior change Action Goal: continue to log her food and increase activity as she can  Outcome goal: Gain confidence and continue progress towards consistent meal and activity tracking, long term weight loss Coordination of care: forwarded [name]'s questions to her doctor  Teaching Method Utilized: Visual, Auditory Handouts given during visit include:  After visit summary Barriers to learning/adherence to lifestyle change: depression and Life stressors Demonstrated degree of understanding via:  Verbalization   Monitoring/Evaluation:  Dietary intake and body weight 5 weeks 05/12/19, RD 08/09/2019 4:48 PM.

## 2019-08-10 NOTE — Telephone Encounter (Signed)
DME - telehealth appt  O2 - per pul "Congratulations you graduated from needing oxygen! Only need to wear oxygen if your levels drop below 88%" Would need in person appt to check O2sat with ambulation  Good to know about BP

## 2019-08-10 NOTE — Telephone Encounter (Signed)
Telehealth Appt sch for 08/11/2019 with [facility].

## 2019-08-11 ENCOUNTER — Ambulatory Visit (INDEPENDENT_AMBULATORY_CARE_PROVIDER_SITE_OTHER): Payer: Medicare HMO | Admitting: Internal Medicine

## 2019-08-11 DIAGNOSIS — N3941 Urge incontinence: Secondary | ICD-10-CM | POA: Diagnosis not present

## 2019-08-11 DIAGNOSIS — J9611 Chronic respiratory failure with hypoxia: Secondary | ICD-10-CM | POA: Diagnosis not present

## 2019-08-11 DIAGNOSIS — R0600 Dyspnea, unspecified: Secondary | ICD-10-CM | POA: Diagnosis not present

## 2019-08-11 NOTE — Progress Notes (Signed)
  [facility] Internal Medicine Residency Telephone Encounter Continuity Care Appointment  HPI:   This telephone encounter was created for [name] on 08/11/2019 for the following purpose/cc request of a rolling walker.  The patient states that in an effort to become more active, she feels like she needs a rolling walker.  The patient states that she is attempting to lose weight because she is morbidly obese, but cannot ambulate very far before she needs to rest to catch her breath.  The patient is on 2 L of supplemental O2 at baseline secondary to obesity hypoventilation syndrome.  She currently spends 24/25 hours in bed due to fatigue when she exerts herself.  The patient lives by herself, and finds it very difficult to complete her ADLs and IADLs like doing laundry, cleaning her house, and cooking due to the fatigue after prolonged standing and walking.  She denies any fevers, cough, chest pain, abdominal pain, nausea or vomiting, or changes in her bowel and bladder habits.  The patient states she does not need any refills on medications approved   Past Medical History:  Past Medical History:  Diagnosis Date  . Arthritis    osteoarthritis , kness.  . Chronic kidney disease    Renal insuffucinecy, cr-1.33 in 10/2005.  [name] Depression    followed by Dr. Evelene Croon  . Diverticulosis of colon   . Fibrocystic breast disease   . History of hepatitis B   . History of pulmonary embolism 01/2002  . Hyperlipidemia   . Hypertension   . Left shoulder pain    2/2 fall  . Neck nodule    not erythematous, movable like cyst located at the base of posterior neck on the left side(not painful), will need follow up for   . Obesity   . Obstructive sleep apnea    cpap   . Pulmonary nodule    Stable on repeat imaging. LLL nodule 5 mm      ROS:   All were reviewed and are otherwise negative unless mentioned in the HPI.   Assessment / Plan / Recommendations:   Please see A&P under problem  oriented charting for assessment of the patient's acute and chronic medical conditions.   As always, pt is advised that if symptoms worsen or new symptoms arise, they should go to an urgent care facility or to to ER for further evaluation.   Consent and Medical Decision Making:   Patient discussed with Dr. Antony Contras  This is a telephone encounter between [name] and [name] on 08/11/2019 for evaluation for the need of a rolling walker. The visit was conducted with the patient located at home and [name] at [facility]. The patient's identity was confirmed using their DOB and current address. The patient has consented to being evaluated through a telephone encounter and understands the associated risks (an examination cannot be done and the patient may need to come in for an appointment) / benefits (allows the patient to remain at home, decreasing exposure to coronavirus). I personally spent 15 minutes on medical discussion.

## 2019-08-12 ENCOUNTER — Encounter: Payer: Self-pay | Admitting: Internal Medicine

## 2019-08-12 NOTE — Assessment & Plan Note (Signed)
Patient has chronic hypoxic respiratory failure likely secondary to her obesity and early stage emphysema.  She is prescribed Incruse.  She has had pulmonary function testing with her pulmonologist which showed an FEV1 over FVC ratio of 80% and FEV1 94% of predicted.  She is also presumed to have OSA, and has a CPAP machine.  Additionally, the patient is believed to have pulmonary hypertension.  She currently uses 2 L supplemental O2 at home.  It is essential for the patient's health that she tries to lose weight, but unfortunately cannot ambulate far enough to have meaningful exercise.  This also severely limits her ability to function at home.  Plan: -DME 4 wheeled rolling walker with seat ordered

## 2019-08-15 ENCOUNTER — Telehealth: Payer: Self-pay

## 2019-08-15 DIAGNOSIS — Z0000 Encounter for general adult medical examination without abnormal findings: Secondary | ICD-10-CM

## 2019-08-15 DIAGNOSIS — Z7689 Persons encountering health services in other specified circumstances: Secondary | ICD-10-CM

## 2019-08-15 NOTE — Telephone Encounter (Signed)
Lewis, Leah  Kriya Westra L, RN; Lewis, Leah; Ott, Jennifer L; Stenson, Melissa; Hague, Mamie C, NT   Got it, thanks!   

## 2019-08-15 NOTE — Telephone Encounter (Signed)
Requesting to speak with a nurse about Rollator, please call back.

## 2019-08-15 NOTE — Progress Notes (Signed)
Internal Medicine Clinic Attending  Case discussed with Dr. Alexander at the time of the visit.  We reviewed the resident's history and exam and pertinent patient test results.  I agree with the assessment, diagnosis, and plan of care documented in the resident's note.  

## 2019-08-15 NOTE — Telephone Encounter (Signed)
Community message sent to Charlton Amor at [facility] for Rollator. Kinnie Feil, BSN, RN-BC

## 2019-08-16 NOTE — Telephone Encounter (Signed)
Returned call to patient. States she received a call from [employer] regarding rollator but it was for a standard rollator which only goes up to 300 lbs. She need a bariatric rollator as her weight is 359 lbs. Will forward to Attending Pool. Kinnie Feil, BSN, RN-BC

## 2019-08-16 NOTE — Telephone Encounter (Signed)
Notified Charlton Amor at [employer] that order has been updated to bariatric rollator. Kinnie Feil, BSN, RN-BC

## 2019-08-16 NOTE — Telephone Encounter (Signed)
DME order placed for bariatric rollator.

## 2019-08-16 NOTE — Telephone Encounter (Signed)
Rollene Fare, RN; Charlton Amor; [place], Cathe Mons, [place]; [place], Mamie C, NT  perfect, thanks!

## 2019-08-17 DIAGNOSIS — R0902 Hypoxemia: Secondary | ICD-10-CM | POA: Diagnosis not present

## 2019-08-17 DIAGNOSIS — R0682 Tachypnea, not elsewhere classified: Secondary | ICD-10-CM | POA: Diagnosis not present

## 2019-08-17 DIAGNOSIS — R0602 Shortness of breath: Secondary | ICD-10-CM | POA: Diagnosis not present

## 2019-08-17 DIAGNOSIS — Z86718 Personal history of other venous thrombosis and embolism: Secondary | ICD-10-CM | POA: Diagnosis not present

## 2019-08-17 DIAGNOSIS — R0603 Acute respiratory distress: Secondary | ICD-10-CM | POA: Diagnosis not present

## 2019-08-17 DIAGNOSIS — J9611 Chronic respiratory failure with hypoxia: Secondary | ICD-10-CM | POA: Diagnosis not present

## 2019-08-17 DIAGNOSIS — R05 Cough: Secondary | ICD-10-CM | POA: Diagnosis not present

## 2019-08-17 DIAGNOSIS — G4733 Obstructive sleep apnea (adult) (pediatric): Secondary | ICD-10-CM | POA: Diagnosis not present

## 2019-08-19 DIAGNOSIS — G4733 Obstructive sleep apnea (adult) (pediatric): Secondary | ICD-10-CM | POA: Diagnosis not present

## 2019-08-25 ENCOUNTER — Encounter: Payer: Self-pay | Admitting: *Deleted

## 2019-08-25 DIAGNOSIS — R0682 Tachypnea, not elsewhere classified: Secondary | ICD-10-CM | POA: Diagnosis not present

## 2019-08-25 DIAGNOSIS — R0602 Shortness of breath: Secondary | ICD-10-CM | POA: Diagnosis not present

## 2019-08-25 DIAGNOSIS — J9611 Chronic respiratory failure with hypoxia: Secondary | ICD-10-CM | POA: Diagnosis not present

## 2019-08-25 DIAGNOSIS — R05 Cough: Secondary | ICD-10-CM | POA: Diagnosis not present

## 2019-08-25 DIAGNOSIS — R0902 Hypoxemia: Secondary | ICD-10-CM | POA: Diagnosis not present

## 2019-08-25 DIAGNOSIS — G4733 Obstructive sleep apnea (adult) (pediatric): Secondary | ICD-10-CM | POA: Diagnosis not present

## 2019-08-25 DIAGNOSIS — Z86718 Personal history of other venous thrombosis and embolism: Secondary | ICD-10-CM | POA: Diagnosis not present

## 2019-08-25 DIAGNOSIS — R0603 Acute respiratory distress: Secondary | ICD-10-CM | POA: Diagnosis not present

## 2019-09-05 DIAGNOSIS — F332 Major depressive disorder, recurrent severe without psychotic features: Secondary | ICD-10-CM | POA: Diagnosis not present

## 2019-09-05 DIAGNOSIS — F329 Major depressive disorder, single episode, unspecified: Secondary | ICD-10-CM | POA: Diagnosis not present

## 2019-09-06 DIAGNOSIS — F329 Major depressive disorder, single episode, unspecified: Secondary | ICD-10-CM | POA: Diagnosis not present

## 2019-09-07 DIAGNOSIS — F329 Major depressive disorder, single episode, unspecified: Secondary | ICD-10-CM | POA: Diagnosis not present

## 2019-09-08 DIAGNOSIS — F329 Major depressive disorder, single episode, unspecified: Secondary | ICD-10-CM | POA: Diagnosis not present

## 2019-09-09 DIAGNOSIS — F329 Major depressive disorder, single episode, unspecified: Secondary | ICD-10-CM | POA: Diagnosis not present

## 2019-09-12 DIAGNOSIS — F329 Major depressive disorder, single episode, unspecified: Secondary | ICD-10-CM | POA: Diagnosis not present

## 2019-09-13 DIAGNOSIS — F329 Major depressive disorder, single episode, unspecified: Secondary | ICD-10-CM | POA: Diagnosis not present

## 2019-09-14 DIAGNOSIS — F329 Major depressive disorder, single episode, unspecified: Secondary | ICD-10-CM | POA: Diagnosis not present

## 2019-09-15 ENCOUNTER — Ambulatory Visit: Payer: Medicare HMO | Admitting: Dietician

## 2019-09-15 DIAGNOSIS — F329 Major depressive disorder, single episode, unspecified: Secondary | ICD-10-CM | POA: Diagnosis not present

## 2019-09-16 DIAGNOSIS — R0682 Tachypnea, not elsewhere classified: Secondary | ICD-10-CM | POA: Diagnosis not present

## 2019-09-16 DIAGNOSIS — F329 Major depressive disorder, single episode, unspecified: Secondary | ICD-10-CM | POA: Diagnosis not present

## 2019-09-16 DIAGNOSIS — R0603 Acute respiratory distress: Secondary | ICD-10-CM | POA: Diagnosis not present

## 2019-09-16 DIAGNOSIS — G4733 Obstructive sleep apnea (adult) (pediatric): Secondary | ICD-10-CM | POA: Diagnosis not present

## 2019-09-16 DIAGNOSIS — R0602 Shortness of breath: Secondary | ICD-10-CM | POA: Diagnosis not present

## 2019-09-16 DIAGNOSIS — J9611 Chronic respiratory failure with hypoxia: Secondary | ICD-10-CM | POA: Diagnosis not present

## 2019-09-16 DIAGNOSIS — R0902 Hypoxemia: Secondary | ICD-10-CM | POA: Diagnosis not present

## 2019-09-16 DIAGNOSIS — R05 Cough: Secondary | ICD-10-CM | POA: Diagnosis not present

## 2019-09-16 DIAGNOSIS — Z86718 Personal history of other venous thrombosis and embolism: Secondary | ICD-10-CM | POA: Diagnosis not present

## 2019-09-18 DIAGNOSIS — G4733 Obstructive sleep apnea (adult) (pediatric): Secondary | ICD-10-CM | POA: Diagnosis not present

## 2019-09-24 DIAGNOSIS — R0902 Hypoxemia: Secondary | ICD-10-CM | POA: Diagnosis not present

## 2019-09-24 DIAGNOSIS — R0682 Tachypnea, not elsewhere classified: Secondary | ICD-10-CM | POA: Diagnosis not present

## 2019-09-24 DIAGNOSIS — G4733 Obstructive sleep apnea (adult) (pediatric): Secondary | ICD-10-CM | POA: Diagnosis not present

## 2019-09-24 DIAGNOSIS — R0602 Shortness of breath: Secondary | ICD-10-CM | POA: Diagnosis not present

## 2019-09-24 DIAGNOSIS — Z86718 Personal history of other venous thrombosis and embolism: Secondary | ICD-10-CM | POA: Diagnosis not present

## 2019-09-24 DIAGNOSIS — J9611 Chronic respiratory failure with hypoxia: Secondary | ICD-10-CM | POA: Diagnosis not present

## 2019-09-24 DIAGNOSIS — R05 Cough: Secondary | ICD-10-CM | POA: Diagnosis not present

## 2019-09-24 DIAGNOSIS — R0603 Acute respiratory distress: Secondary | ICD-10-CM | POA: Diagnosis not present

## 2019-09-29 ENCOUNTER — Other Ambulatory Visit: Payer: Self-pay

## 2019-09-29 ENCOUNTER — Encounter: Payer: Self-pay | Admitting: Dietician

## 2019-09-29 ENCOUNTER — Ambulatory Visit (INDEPENDENT_AMBULATORY_CARE_PROVIDER_SITE_OTHER): Payer: Medicare HMO | Admitting: Dietician

## 2019-09-29 DIAGNOSIS — Z6841 Body Mass Index (BMI) 40.0 and over, adult: Secondary | ICD-10-CM | POA: Diagnosis not present

## 2019-09-29 DIAGNOSIS — N182 Chronic kidney disease, stage 2 (mild): Secondary | ICD-10-CM | POA: Diagnosis not present

## 2019-09-29 DIAGNOSIS — Z713 Dietary counseling and surveillance: Secondary | ICD-10-CM

## 2019-09-29 NOTE — Progress Notes (Signed)
Medical Nutrition Therapy:   Appt start time: 1315 end time:  1410 Total time:55 minutes Visit # 8  Assessment: [name] has been using myfitnesspal regularly with RDN feedback weekly until she was admitted to outpatient behavioral health program. She was started on new medication for her depression and is waiting for an appointment with a counselor.   She reports feeling hunger a few times, but does not want to record hunger cues in addition to her food records. She was congratulated on achieving recognition of her physical hunger feeling. Her weight  decreased over the past month and a half by 7.5# for a total weight loss of 27.5# in the past 7 months (~ 1# per week gradual and steady loss). She states she is feeing better about her weight loss, but still wants to know about fasting. Her breathing is better. Her Rolator walker is helping her be more active.   ANTHROPOMETRICS: Estimated body mass index is 53.66 kg/m as calculated from the following:   Height as of 06/06/19: 5\' 8"  (1.727 m).   Weight as of this encounter: 352 lb 14.4 oz (160.1 kg).   Wt Readings from Last 10 Encounters:  09/29/19 (!) 352 lb 14.4 oz (160.1 kg)  08/09/19 (!) 359 lb 6.4 oz (163 kg)  07/14/19 (!) 360 lb 12.8 oz (163.7 kg)  06/23/19 (!) 362 lb 8 oz (164.4 kg)  06/06/19 (!) 364 lb 6.4 oz (165.3 kg)  05/26/19 (!) 365 lb 14.4 oz (166 kg)  05/26/19 (!) 365 lb 11.2 oz (165.9 kg)  04/11/19 (!) 378 lb 9.6 oz (171.7 kg)  03/28/19 (!) 380 lb 4.8 oz (172.5 kg)  03/24/19 (!) 376 lb (170.6 kg)   SLEEP: not feeling rested despite more than adequate rest; wears CPAP  DIETARY INTAKE: Recording consistently using My fitmess PAL; Her calories, protein, fiber, fat have remained close to her target   Usual physical activity: having a difficult time with this as her depression is causing her to want to stay in bed all day. Her goal is eventually at least 22 minutes daily walking in her hallways or parking lot  Progress  Towards Goal(s):  Some progress.   Nutritional Diagnosis:  NB-1.5 Disordered eating pattern As related to eating one meal a day and binging at other times continues to improve As evidenced by her report of less  emotional eating and eating most days 2 times.    Intervention:  Nutrition education and counseling about increasing fruits in her meal plan to balance out her intake and  Fasting, assisted her in setting up a fasting plan.  Action Goal: continue to log her food and increase activity as she can  Outcome goal: Gain confidence and continue progress towards consistent meal and activity tracking, long term weight loss Coordination of care: advised patient to be sure psychiatrist knows she is working on weight loss Teaching Method Utilized: visual and  Auditory Handouts given during visit include:  After visit summary Barriers to learning/adherence to lifestyle change: depression and Life stressors Demonstrated degree of understanding via:  Verbalization   Monitoring/Evaluation:  Dietary intake and body weight 6 weeks 03/26/19, RD 09/29/2019 3:14 PM.

## 2019-09-29 NOTE — Patient Instructions (Addendum)
Ideas to help make your meals more balanced  Eat more fruit- goals for most is 2-3 1/2 cup servings a day - get small cans/containers - frozen fruit works- you can freeze a lot of fresh fruit - a small smoothie can also use yogurt, milk oatmeal   Fasting recommended is 8 hours of eating and 16 hours of water, black coffee or unsweetened tea. An example is  Meal 1 11-12 noon  Meal 2  3-4 PM Meal 3 6-7 PM   Suggest trying not to eat after 7 PM until 11 am the next day. Drink as above and the psyllium husk in water is fine  You are doing great! Hope medicine and counseling helps you fee better!  Lupita Leash [phone]

## 2019-10-17 DIAGNOSIS — R0603 Acute respiratory distress: Secondary | ICD-10-CM | POA: Diagnosis not present

## 2019-10-17 DIAGNOSIS — R0902 Hypoxemia: Secondary | ICD-10-CM | POA: Diagnosis not present

## 2019-10-17 DIAGNOSIS — J9611 Chronic respiratory failure with hypoxia: Secondary | ICD-10-CM | POA: Diagnosis not present

## 2019-10-17 DIAGNOSIS — R0602 Shortness of breath: Secondary | ICD-10-CM | POA: Diagnosis not present

## 2019-10-17 DIAGNOSIS — Z86718 Personal history of other venous thrombosis and embolism: Secondary | ICD-10-CM | POA: Diagnosis not present

## 2019-10-17 DIAGNOSIS — R0682 Tachypnea, not elsewhere classified: Secondary | ICD-10-CM | POA: Diagnosis not present

## 2019-10-17 DIAGNOSIS — G4733 Obstructive sleep apnea (adult) (pediatric): Secondary | ICD-10-CM | POA: Diagnosis not present

## 2019-10-17 DIAGNOSIS — R05 Cough: Secondary | ICD-10-CM | POA: Diagnosis not present

## 2019-10-19 DIAGNOSIS — G4733 Obstructive sleep apnea (adult) (pediatric): Secondary | ICD-10-CM | POA: Diagnosis not present

## 2019-10-25 DIAGNOSIS — R05 Cough: Secondary | ICD-10-CM | POA: Diagnosis not present

## 2019-10-25 DIAGNOSIS — Z86718 Personal history of other venous thrombosis and embolism: Secondary | ICD-10-CM | POA: Diagnosis not present

## 2019-10-25 DIAGNOSIS — R0902 Hypoxemia: Secondary | ICD-10-CM | POA: Diagnosis not present

## 2019-10-25 DIAGNOSIS — R0602 Shortness of breath: Secondary | ICD-10-CM | POA: Diagnosis not present

## 2019-10-25 DIAGNOSIS — J9611 Chronic respiratory failure with hypoxia: Secondary | ICD-10-CM | POA: Diagnosis not present

## 2019-10-25 DIAGNOSIS — R0682 Tachypnea, not elsewhere classified: Secondary | ICD-10-CM | POA: Diagnosis not present

## 2019-10-25 DIAGNOSIS — R0603 Acute respiratory distress: Secondary | ICD-10-CM | POA: Diagnosis not present

## 2019-10-25 DIAGNOSIS — G4733 Obstructive sleep apnea (adult) (pediatric): Secondary | ICD-10-CM | POA: Diagnosis not present

## 2019-11-10 ENCOUNTER — Ambulatory Visit (INDEPENDENT_AMBULATORY_CARE_PROVIDER_SITE_OTHER): Payer: Medicare HMO | Admitting: Dietician

## 2019-11-10 ENCOUNTER — Other Ambulatory Visit: Payer: Self-pay

## 2019-11-10 ENCOUNTER — Telehealth: Payer: Self-pay | Admitting: Dietician

## 2019-11-10 ENCOUNTER — Encounter: Payer: Self-pay | Admitting: Dietician

## 2019-11-10 DIAGNOSIS — N182 Chronic kidney disease, stage 2 (mild): Secondary | ICD-10-CM

## 2019-11-10 NOTE — Progress Notes (Signed)
  Medical Nutrition Therapy:   Appt start time: 1315 end time:  1445 Total time:90 minutes Visit # 9  Goals- weight loss and maintenance with improved physical and mental health Expected outcomes/Reasons for goals- improve and maintain her independence  Assessment: Ms. Fellows preswent stoday without oxygen and moving well with her rollator walker. She continues to use myfitnesspal regularly to record her food intake and physical activity with RDN feedback weekly. She reports a change in her psychiatric medications to help her get up and move more. She states she is working on emotional eating with her therapist.She is a bit stressed about packing for a move to assisted living community coming up in November.  She states the fasting did not work for her. However, she did feel hunger a few times especially when trying to fast. She was congratulated on achieving recognition of her physical hunger feeling. Her weight has not changed since her last visit but she is still at 3/4 #/week weight loss. Reviewed progress and reports from myfitness pal  For total calories, protein and fiber.   ANTHROPOMETRICS: Estimated body mass index is 53.75 kg/m as calculated from the following:   Height as of 06/06/19: 5\' 8"  (1.727 m).   Weight as of this encounter: 353 lb 8 oz (160.3 kg).   Wt Readings from Last 10 Encounters:  11/10/19 (!) 353 lb 8 oz (160.3 kg)  09/29/19 (!) 352 lb 14.4 oz (160.1 kg)  08/09/19 (!) 359 lb 6.4 oz (163 kg)  07/14/19 (!) 360 lb 12.8 oz (163.7 kg)  06/23/19 (!) 362 lb 8 oz (164.4 kg)  06/06/19 (!) 364 lb 6.4 oz (165.3 kg)  05/26/19 (!) 365 lb 14.4 oz (166 kg)  05/26/19 (!) 365 lb 11.2 oz (165.9 kg)  04/11/19 (!) 378 lb 9.6 oz (171.7 kg)  03/28/19 (!) 380 lb 4.8 oz (172.5 kg)   SLEEP: not discussed today  Medication: since 09/25/19- on wellbutrin 300xl mg; wants to change to 200mg  and 100 mg, and 5 mg tr DIETARY INTAKE: Recording consistently using My fitmess PAL; Her calories,  protein, fiber, fat have remained close to her target  With a few episodes of very high calories intake from binges  Usual physical activity: improving fitness noted by her walking all the way to the office and back to her vehicle today. Her goal is eventually at least 22 minutes daily walking in her hallways or parking lot  Progress Towards Goal(s):  Some progress.   Nutritional Diagnosis:  NB-1.5 Disordered eating pattern As related to eating one meal a day and binging at other times continues to improve As evidenced by her report of progress on identifying triggers for emotional eating and eating most days 2 times.    Intervention:  Nutrition education and counseling about beghavior change nutrition and physical activity needed for weight loss. Action Goal: continue to log her food and increase activity as she can  Outcome goal: Gain confidence and continue progress towards consistent meal and activity tracking, long term weight loss Coordination of care: advised patient to be sure psychiatrist knows she is working on weight loss Teaching Method Utilized: visual and  Auditory Handouts given during visit include:  After visit summary Barriers to learning/adherence to lifestyle change: depression and Life stressors Demonstrated degree of understanding via:  Verbalization   Monitoring/Evaluation:  Dietary intake and body weight 6 weeks 09/27/19, RD 11/10/2019 3:48 PM.

## 2019-11-10 NOTE — Telephone Encounter (Signed)
Late entry for email from Ms. Whitford:  Hello!  Will be visiting with you Thursday Sep 9th.  Since last seeing you:  - My psychiatrist weaned me off of Trintellix 20mg  to Wellbutrin(Bupropion XL) 300mg  and Trintellix 5mg .  Everything should me in my system pretty good now.  Her goal is to aid in getting me out of the bed. - My eating has sort of changed. Have not been able to get back to my way of eating.  Have not been able to do the fasting yet.  Tried twice but "hunger pangs" had me breaking it. - Noticed I'm not reaching protein levels as recommended by you.  How is protein affecting my body and why did you recommend the high protein amount - I know I'm losing inches. - Right knee aching and now has me limping a little bit while walking - Breathing getting better and not losing oxygen as easy when exerting myself. - Stomach has reflex symptoms when I eat foods with grease/oil in them and sometimes way later than after I ate.  Used to take a little baking soda to help with symptoms.  No longer works.  Trying to take raw organic vinegar with mother daily.  Appears to relieve stomach symptoms. - The plant you gave me is doing good.  See you Thursday! Patricia L. 

## 2019-11-10 NOTE — Patient Instructions (Addendum)
Thank you for your visit today!  Some of what we talked about today is:  Your goals-  weight loss, mental health Why you are doing this hard work? Maintain independence Your weight-  Good job on maintaining your weight through all the changes Your progress over the past ~ 9 months- has been consistent and it is okay to take a break and just maintain while working on other things- think [employer]URE- your lifetime  Your protein needs- higher for weight loss, lower for chronic kidney diease- we don't know for sure but at least 100-130 grams of protein per day is my estimate  Decreasing Calories below what you need per day is the most important for weight loss Amount of physical activity is most important along with maintaining the decreased calories for keeping off what you lost  Medications help- wellbutron, but changing behavior should happen during this time to maintain long term  Changing gut hormines to your advantage- fiber (whole grains, fruits and veggies)  and fermented foods.   Lupita Leash [phone]

## 2019-11-14 ENCOUNTER — Telehealth: Payer: Self-pay | Admitting: *Deleted

## 2019-11-14 NOTE — Telephone Encounter (Signed)
Called pt - felled Thursday in the bathroom; slipped on a rug. Hit left knee. Did not use ice nor heat. Not swollen. She has not used/taken anything; just rubbing it with her hands. Stated she looked it up on the Internet; prefers not to take any medication. Stated the "pain is easing up". Pt does not want to schedule an appt at this time; informed if pain continues or worsens, or notice any swelling/redness to call back at least by Thursday (or sooner) which will be a week to schedule an appt. Also to try ice/heat. Voiced understanding.

## 2019-11-14 NOTE — Telephone Encounter (Signed)
Patient fell last Thursday and twisted left leg. Have questions to be sure she is treating it correctly.

## 2019-11-17 DIAGNOSIS — R05 Cough: Secondary | ICD-10-CM | POA: Diagnosis not present

## 2019-11-17 DIAGNOSIS — R0603 Acute respiratory distress: Secondary | ICD-10-CM | POA: Diagnosis not present

## 2019-11-17 DIAGNOSIS — R0682 Tachypnea, not elsewhere classified: Secondary | ICD-10-CM | POA: Diagnosis not present

## 2019-11-17 DIAGNOSIS — Z86718 Personal history of other venous thrombosis and embolism: Secondary | ICD-10-CM | POA: Diagnosis not present

## 2019-11-17 DIAGNOSIS — J9611 Chronic respiratory failure with hypoxia: Secondary | ICD-10-CM | POA: Diagnosis not present

## 2019-11-17 DIAGNOSIS — R0602 Shortness of breath: Secondary | ICD-10-CM | POA: Diagnosis not present

## 2019-11-17 DIAGNOSIS — R0902 Hypoxemia: Secondary | ICD-10-CM | POA: Diagnosis not present

## 2019-11-17 DIAGNOSIS — G4733 Obstructive sleep apnea (adult) (pediatric): Secondary | ICD-10-CM | POA: Diagnosis not present

## 2019-11-19 DIAGNOSIS — G4733 Obstructive sleep apnea (adult) (pediatric): Secondary | ICD-10-CM | POA: Diagnosis not present

## 2019-11-25 DIAGNOSIS — R0603 Acute respiratory distress: Secondary | ICD-10-CM | POA: Diagnosis not present

## 2019-11-25 DIAGNOSIS — Z86718 Personal history of other venous thrombosis and embolism: Secondary | ICD-10-CM | POA: Diagnosis not present

## 2019-11-25 DIAGNOSIS — J9611 Chronic respiratory failure with hypoxia: Secondary | ICD-10-CM | POA: Diagnosis not present

## 2019-11-25 DIAGNOSIS — G4733 Obstructive sleep apnea (adult) (pediatric): Secondary | ICD-10-CM | POA: Diagnosis not present

## 2019-11-25 DIAGNOSIS — R0602 Shortness of breath: Secondary | ICD-10-CM | POA: Diagnosis not present

## 2019-11-25 DIAGNOSIS — R0682 Tachypnea, not elsewhere classified: Secondary | ICD-10-CM | POA: Diagnosis not present

## 2019-11-25 DIAGNOSIS — R0902 Hypoxemia: Secondary | ICD-10-CM | POA: Diagnosis not present

## 2019-11-25 DIAGNOSIS — R05 Cough: Secondary | ICD-10-CM | POA: Diagnosis not present

## 2019-12-17 DIAGNOSIS — J9611 Chronic respiratory failure with hypoxia: Secondary | ICD-10-CM | POA: Diagnosis not present

## 2019-12-17 DIAGNOSIS — R0603 Acute respiratory distress: Secondary | ICD-10-CM | POA: Diagnosis not present

## 2019-12-17 DIAGNOSIS — I272 Pulmonary hypertension, unspecified: Secondary | ICD-10-CM | POA: Diagnosis not present

## 2019-12-17 DIAGNOSIS — Z86718 Personal history of other venous thrombosis and embolism: Secondary | ICD-10-CM | POA: Diagnosis not present

## 2019-12-17 DIAGNOSIS — G4733 Obstructive sleep apnea (adult) (pediatric): Secondary | ICD-10-CM | POA: Diagnosis not present

## 2019-12-17 DIAGNOSIS — R0682 Tachypnea, not elsewhere classified: Secondary | ICD-10-CM | POA: Diagnosis not present

## 2019-12-17 DIAGNOSIS — R0602 Shortness of breath: Secondary | ICD-10-CM | POA: Diagnosis not present

## 2019-12-17 DIAGNOSIS — R0902 Hypoxemia: Secondary | ICD-10-CM | POA: Diagnosis not present

## 2019-12-19 DIAGNOSIS — G4733 Obstructive sleep apnea (adult) (pediatric): Secondary | ICD-10-CM | POA: Diagnosis not present

## 2019-12-19 DIAGNOSIS — H25813 Combined forms of age-related cataract, bilateral: Secondary | ICD-10-CM | POA: Diagnosis not present

## 2019-12-19 DIAGNOSIS — H43811 Vitreous degeneration, right eye: Secondary | ICD-10-CM | POA: Diagnosis not present

## 2019-12-21 ENCOUNTER — Other Ambulatory Visit: Payer: Self-pay

## 2019-12-21 NOTE — Patient Outreach (Signed)
[facility]) Care Management  12/21/2019  [name] 08-03-1945 [id number]   Referral Date: 12/20/19 Referral Source: Self referral Referral Reason: Needs help after surgery  Outreach Attempt:  Spoke with patient. Discussed reason for referral.  She states she is needing cataract surgery and that she has no one who can stay with her overnight or go to their home for both surgeries. Patient does not belong to a local church either that could possibly be an option.  After inquiring about resources. Advised patient rely on her family despite them living so far or CM sending a list of care agencies that provide sitters. Patient asked CM to e-mail list to [email or web address].    Social: Patient lives alone and has no local support system. She has family in [place]. She states her sister that lives in [place] calls her daily.  Patient states she is independent with all aspects of care and still drives.    Conditions: Patient admits to HTN, Depression, and Hyperlipidemia.  Patient active with therapist for depression and states improvement with her managing it.  Patient reports she manages well with her health conditions and declines disease management support.   Medications: Patient takes medications as prescribed and voices no concerns with medications.    Appointments: Patient sees physicians regularly and voices no problems with transportation.     Advanced Directives: Patient has advanced directives in place.      Plan: RN CM will e-mail list of agencies to patient RN CM will close case.     Bary Leriche, RN, MSN [facility] Care Management Care Management Coordinator Direct Line [phone] Toll Free: [phone]  Fax: [phone]

## 2019-12-22 ENCOUNTER — Encounter: Payer: Self-pay | Admitting: Dietician

## 2019-12-22 ENCOUNTER — Ambulatory Visit (INDEPENDENT_AMBULATORY_CARE_PROVIDER_SITE_OTHER): Payer: Medicare HMO | Admitting: Dietician

## 2019-12-22 DIAGNOSIS — N182 Chronic kidney disease, stage 2 (mild): Secondary | ICD-10-CM | POA: Diagnosis not present

## 2019-12-22 DIAGNOSIS — Z713 Dietary counseling and surveillance: Secondary | ICD-10-CM

## 2019-12-22 NOTE — Progress Notes (Signed)
Medical Nutrition Therapy:   Appt start time: 1320 end time:  1420 Total time:60 minutes Visit # 10  Goals- weight loss and maintenance with improved physical and mental health. Lowest has been 340# Expected outcomes/Reasons for goals- improve and maintain her independence  Assessment: [name] presents today moving well with her rollator walker. "I think I am doing good". She continues to use myfitnesspal regularly to record her food intake and physical activity with RDN feedback weekly. She decreased her goal calories after our last visit to 2150 calories/day. She states she is working on emotional eating with her therapist, continues with peer and group support meetings weekly. She mentions stressors/roadblocks she is working on concurrently. Her weight loss has slowed but continues to decrease ~ 1/4# per week for the past 6 weeks.  Her BMI has dropped from 58.43 to 53.41. she has lost a total of 33#.   ANTHROPOMETRICS: Estimated body mass index is 53.41 kg/m as calculated from the following:   Height as of 06/06/19: 5\' 8"  (1.727 m).   Weight as of this encounter: 351 lb 4.8 oz (159.3 kg).   Wt Readings from Last 10 Encounters:  12/22/19 (!) 351 lb 4.8 oz (159.3 kg)  11/10/19 (!) 353 lb 8 oz (160.3 kg)  09/29/19 (!) 352 lb 14.4 oz (160.1 kg)  08/09/19 (!) 359 lb 6.4 oz (163 kg)  07/14/19 (!) 360 lb 12.8 oz (163.7 kg)  06/23/19 (!) 362 lb 8 oz (164.4 kg)  06/06/19 (!) 364 lb 6.4 oz (165.3 kg)  05/26/19 (!) 365 lb 14.4 oz (166 kg)  05/26/19 (!) 365 lb 11.2 oz (165.9 kg)  04/11/19 (!) 378 lb 9.6 oz (171.7 kg)   SLEEP: having troubles sleeping every other night and uses food to help her sleep  DIETARY INTAKE: Recording consistently using My fitness PAL; Her calories, protein, fiber, fat have remained close to her target  With a few episodes of very high calories intake from binges  Usual physical activity: improving fitness noted by her walking all the way to the office and back to  her vehicle today. Her goal is eventually at least 22 minutes daily walking in her hallways or parking lot  Progress Towards Goal(s):  Some progress.   Nutritional Diagnosis:  NB-1.5 Disordered eating pattern As related to eating one meal a day and binging at other times continues to improve As evidenced by her report of progress on identifying triggers for emotional eating and eating most days 2 times.    Intervention:  Nutrition education and counseling about behavior change nutrition and physical activity needed for weight loss. Education on HALT to help with mindless eating.  Action Goal: continue to log her food and increase activity as she can  Outcome goal: Gain confidence and continue progress towards consistent meal and activity tracking, long term weight loss Coordination of care: none at this time Teaching Method Utilized: visual and  Auditory Handouts given during visit include:  After visit summary Barriers to learning/adherence to lifestyle change: depression and Life stressors Demonstrated degree of understanding via:  Verbalization   Monitoring/Evaluation:  Dietary intake and body weight 6 weeks 06/09/19, RD 12/22/2019 2:45 PM.  Addendum- patient reports that she had a fall the night she was last here on a throw rug getting up from her bedside commode and hurt her left knee. She says her knee is better now. I suggested she discuss this with Dr. 12/24/2019 as her goal is to be independent.  Could consider PT  and or OT.

## 2019-12-22 NOTE — Patient Instructions (Addendum)
Keep logging your calories..  Continue walking as much as you can and logging it.   You have a lot of hard work going on.   You are doing well at maintaining.   [name] [phone]

## 2019-12-25 DIAGNOSIS — R0902 Hypoxemia: Secondary | ICD-10-CM | POA: Diagnosis not present

## 2019-12-25 DIAGNOSIS — J9611 Chronic respiratory failure with hypoxia: Secondary | ICD-10-CM | POA: Diagnosis not present

## 2019-12-25 DIAGNOSIS — R0602 Shortness of breath: Secondary | ICD-10-CM | POA: Diagnosis not present

## 2019-12-25 DIAGNOSIS — R0603 Acute respiratory distress: Secondary | ICD-10-CM | POA: Diagnosis not present

## 2019-12-25 DIAGNOSIS — G4733 Obstructive sleep apnea (adult) (pediatric): Secondary | ICD-10-CM | POA: Diagnosis not present

## 2019-12-25 DIAGNOSIS — R0682 Tachypnea, not elsewhere classified: Secondary | ICD-10-CM | POA: Diagnosis not present

## 2019-12-25 DIAGNOSIS — Z86718 Personal history of other venous thrombosis and embolism: Secondary | ICD-10-CM | POA: Diagnosis not present

## 2019-12-25 DIAGNOSIS — I272 Pulmonary hypertension, unspecified: Secondary | ICD-10-CM | POA: Diagnosis not present

## 2020-01-05 ENCOUNTER — Ambulatory Visit (INDEPENDENT_AMBULATORY_CARE_PROVIDER_SITE_OTHER): Payer: Medicare HMO | Admitting: Internal Medicine

## 2020-01-05 ENCOUNTER — Encounter: Payer: Self-pay | Admitting: Internal Medicine

## 2020-01-05 ENCOUNTER — Other Ambulatory Visit: Payer: Self-pay

## 2020-01-05 VITALS — BP 113/52 | HR 99 | Temp 98.7°F | Ht 68.0 in | Wt 355.1 lb

## 2020-01-05 DIAGNOSIS — E042 Nontoxic multinodular goiter: Secondary | ICD-10-CM

## 2020-01-05 DIAGNOSIS — I749 Embolism and thrombosis of unspecified artery: Secondary | ICD-10-CM | POA: Diagnosis not present

## 2020-01-05 DIAGNOSIS — Z23 Encounter for immunization: Secondary | ICD-10-CM | POA: Diagnosis not present

## 2020-01-05 DIAGNOSIS — J9611 Chronic respiratory failure with hypoxia: Secondary | ICD-10-CM

## 2020-01-05 DIAGNOSIS — I129 Hypertensive chronic kidney disease with stage 1 through stage 4 chronic kidney disease, or unspecified chronic kidney disease: Secondary | ICD-10-CM

## 2020-01-05 DIAGNOSIS — K219 Gastro-esophageal reflux disease without esophagitis: Secondary | ICD-10-CM

## 2020-01-05 DIAGNOSIS — F322 Major depressive disorder, single episode, severe without psychotic features: Secondary | ICD-10-CM | POA: Diagnosis not present

## 2020-01-05 DIAGNOSIS — N3946 Mixed incontinence: Secondary | ICD-10-CM | POA: Diagnosis not present

## 2020-01-05 DIAGNOSIS — I2729 Other secondary pulmonary hypertension: Secondary | ICD-10-CM | POA: Diagnosis not present

## 2020-01-05 DIAGNOSIS — I10 Essential (primary) hypertension: Secondary | ICD-10-CM

## 2020-01-05 DIAGNOSIS — N182 Chronic kidney disease, stage 2 (mild): Secondary | ICD-10-CM | POA: Diagnosis not present

## 2020-01-05 DIAGNOSIS — Z6841 Body Mass Index (BMI) 40.0 and over, adult: Secondary | ICD-10-CM | POA: Diagnosis not present

## 2020-01-05 DIAGNOSIS — E782 Mixed hyperlipidemia: Secondary | ICD-10-CM

## 2020-01-05 NOTE — Patient Instructions (Signed)
It was a pleasure to meet you today, [name]!  Today we discussed your annoying cough, and I'd like you to try a medication which helps treat acid reflux, called Pepcid 20 mg, to take 1 twice a day.    Your blood pressure looks great today, and you will remain OFF of the amlodipine.  Please continue to get up carefully when you feel dizzy (this is coming from the doxepin).    I hope that your mood continues to improve.  I know things have been difficult.  Blood work will be updated today and you can see the results in MyChart.  I'll call you if anything needs attention before our next visit.  Take care and stay well, Dr. Mayford Knife

## 2020-01-06 LAB — LIPID PANEL
Chol/HDL Ratio: 3.6 ratio (ref 0.0–4.4)
Cholesterol, Total: 156 mg/dL (ref 100–199)
HDL: 43 mg/dL (ref >39)
LDL Chol Calc (NIH): 98 mg/dL (ref 0–99)
Triglycerides: 77 mg/dL (ref 0–149)
VLDL Cholesterol Cal: 15 mg/dL (ref 5–40)

## 2020-01-06 LAB — BMP8+ANION GAP
Anion Gap: 20 mmol/L — ABNORMAL HIGH (ref 10.0–18.0)
BUN/Creatinine Ratio: 17 (ref 12–28)
BUN: 24 mg/dL (ref 8–27)
CO2: 16 mmol/L — ABNORMAL LOW (ref 20–29)
Calcium: 9.6 mg/dL (ref 8.7–10.3)
Chloride: 102 mmol/L (ref 96–106)
Creatinine, Ser: 1.4 mg/dL — ABNORMAL HIGH (ref 0.57–1.00)
GFR calc Af Amer: 43 mL/min/{1.73_m2} — ABNORMAL LOW (ref >59)
GFR calc non Af Amer: 38 mL/min/{1.73_m2} — ABNORMAL LOW (ref >59)
Glucose: 117 mg/dL — ABNORMAL HIGH (ref 65–99)
Potassium: 4.7 mmol/L (ref 3.5–5.2)
Sodium: 138 mmol/L (ref 134–144)

## 2020-01-06 LAB — TSH: TSH: 7.19 u[IU]/mL — ABNORMAL HIGH (ref 0.450–4.500)

## 2020-01-09 NOTE — Progress Notes (Signed)
Established Patient Office Visit  Subjective:  Patient ID: [name], female    DOB: 06-16-1945  Age: 73 y.o. MRN: [id number]  CC:  Chief Complaint  Patient presents with  . Establish Care    HPI [name] presents for routine f/u of chronic conditions and to meet new PCP, formerly a patient of Dr. Rogelia Boga. Her primary concern is ongoing depression (psychiatrist Dr. Evelene Croon), for which she is in counseling but continuing to feel very low - particularly in the past couple days; she has not yet checked in with her virtual support group "I just don't have the energy".  Dr. Evelene Croon had recently reduced her Trintellix from 20 to 5 mg due to side effects.   Widowed, no local family, a dear pet has also died.  She is not having suicidal thoughts, but has severe apathy and hypersomnolence "I spend the entire day in bed; sometimes I don't even change clothes or clean up".  This recumbency exacerbates her GERD symptoms and may be responsible for a cough she's noticed over the past few months - nagging, non productive, no dyspnea or chest pain. "It doesn't really bother me, I've just noticed it".    Dr. Rennis Harding, pulmonologist (dxs emphysema, chronic hypoxic resp failure) - "I need to make a f/u visit".  Breathing has generally improved.  She has an OTC pOximeter which always reads in the 90s.  She has 02 but hasn't needed to use it.  SHe is overdue on some health maintenance actions but hasn't felt up to pursuing anything. TDaP needs 10 yr update.  Pneumococcal and Covid vaccinations are UTD.  [name] brings a detailed typed medication list so that we can update our records.  Past Medical History:  Diagnosis Date  . Arthritis    osteoarthritis , kness.  . Chronic kidney disease    Renal insuffucinecy, cr-1.33 in 10/2005.  [name] Depression    followed by Dr. Evelene Croon  . Diverticulosis of colon   . Fibrocystic breast disease   . History of hepatitis B   . History of pulmonary  embolism 01/2002  . Hyperlipidemia   . Hypertension   . Left shoulder pain    2/2 fall  . Neck nodule    not erythematous, movable like cyst located at the base of posterior neck on the left side(not painful), will need follow up for   . Obesity   . Obstructive sleep apnea    cpap   . Pulmonary nodule    Stable on repeat imaging. LLL nodule 5 mm    Past Surgical History:  Procedure Laterality Date  . CHOLECYSTECTOMY N/A 08/09/2015   Procedure: LAPAROSCOPIC CHOLECYSTECTOMY WITH INTRAOPERATIVE CHOLANGIOGRAM;  Surgeon: Harriette Bouillon, MD;  Location: [facility] OR;  Service: General;  Laterality: N/A;  . FLEXIBLE SIGMOIDOSCOPY N/A 04/29/2018   Procedure: FLEXIBLE SIGMOIDOSCOPY;  Surgeon: Carman Ching, MD;  Location: [facility] ENDOSCOPY;  Service: Endoscopy;  Laterality: N/A;  . GYN surgery     s/p excison of vulvar cyst 10/18/04 by dr. Ilene QuaChristell Constant     Outpatient Medications Prior to Visit  Medication Sig Dispense Refill  . ALPRAZolam (XANAX) 1 MG tablet Take 2 mg by mouth 3 (three) times daily as needed for anxiety or sleep.     [name] doxepin (SINEQUAN) 10 MG capsule Take 10 mg by mouth at bedtime. Take one to two capsules by mouth at bedtime    . mirabegron ER (MYRBETRIQ) 25 MG TB24 tablet Take 1 tablet (25 mg  total) by mouth daily. 30 tablet   . Multiple Vitamin (MULTIVITAMIN) tablet Take 1 tablet by mouth daily. (Patient taking differently: Take 1 tablet by mouth every evening. ) 30 tablet 3  . olmesartan (BENICAR) 20 MG tablet TAKE 1 TABLET EVERY DAY 90 tablet 3  . Omega-3 Fatty Acids (OMEGA-3 FISH OIL PO) Take 1 capsule by mouth every evening.     . potassium chloride (KLOR-CON) 10 MEQ tablet TAKE 1 TABLET TWICE DAILY 180 tablet 3  . pravastatin (PRAVACHOL) 40 MG tablet TAKE 1 TABLET EVERY DAY 90 tablet 3  . vortioxetine HBr (TRINTELLIX) 20 MG TABS tablet Take 5 mg by mouth every evening.    [name] 20 MG TABS tablet TAKE 1 TABLET EVERY DAY WITH SUPPER 90 tablet 3  . amLODipine (NORVASC)  10 MG tablet TAKE 1 TABLET EVERY DAY 90 tablet 3  . cyclobenzaprine (FLEXERIL) 5 MG tablet Take 1 tablet (5 mg total) by mouth daily as needed for muscle spasms. 20 tablet 0  . MYRBETRIQ 50 MG TB24 tablet       Allergies  Allergen Reactions  . Lisinopril Cough      Objective:    Physical Exam  BP (!) 113/52 (BP Location: Right Arm, Patient Position: Sitting, Cuff Size: Normal)   Pulse 99   Temp 98.7 F (37.1 C) (Oral)   Ht 5\' 8"  (1.727 m)   Wt (!) 355 lb 1.6 oz (161.1 kg)   SpO2 96%   BMI 53.99 kg/m  Wt Readings from Last 3 Encounters:  01/05/20 (!) 355 lb 1.6 oz (161.1 kg)  12/22/19 (!) 351 lb 4.8 oz (159.3 kg)  11/10/19 (!) 353 lb 8 oz (160.3 kg)   Habitus obese.  She was calm, oriented, exhibited a flat but not tearful affect.  Speech quantity and language were normal.  Heart RRR no significant murmur or extra heart sound; lungs clear.  Legs with 2+ nonpitting edema bilateral.    Lab Results  Component Value Date   TSH 7.190 (H) 01/05/2020   Lab Results  Component Value Date   WBC 7.5 10/14/2018   HGB 14.2 10/14/2018   HCT 43.8 10/14/2018   MCV 81 10/14/2018   PLT 241 10/14/2018   Lab Results  Component Value Date   NA 138 01/05/2020   K 4.7 01/05/2020   CO2 16 (L) 01/05/2020   GLUCOSE 117 (H) 01/05/2020   BUN 24 01/05/2020   CREATININE 1.40 (H) 01/05/2020   BILITOT 0.3 10/14/2018   ALKPHOS 81 10/14/2018   AST 24 10/14/2018   ALT 17 10/14/2018   PROT 7.2 10/14/2018   ALBUMIN 4.1 10/14/2018   CALCIUM 9.6 01/05/2020   ANIONGAP 8 12/03/2016   Lab Results  Component Value Date   CHOL 156 01/05/2020   Lab Results  Component Value Date   HDL 43 01/05/2020   Lab Results  Component Value Date   LDLCALC 98 01/05/2020   Lab Results  Component Value Date   TRIG 77 01/05/2020   Lab Results  Component Value Date   CHOLHDL 3.6 01/05/2020   Lab Results  Component Value Date   HGBA1C 5.7 (A) 02/04/2019      Assessment & Plan:   [name]  most immediate problem is her uncontrolled major depression.  A mild cough and mild heartburn sxs when recumbent may be due to GERD; she will trial OTC famotidine.  Multiple bloodtests and some preventative/health maintenance items are overdue, and will be updated as she feels emotionally  able (flu shot and bloodwork today).  See problem list for all items addressed today.   Problem List Items Addressed This Visit      Genitourinary   Chronic kidney disease (CKD) stage G2/A1, mildly decreased glomerular filtration rate (GFR) between 60-89 mL/min/1.73 square meter and albuminuria creatinine ratio less than 30 mg/g (Chronic)   Relevant Orders   BMP8+Anion Gap (Completed)     Other   Hyperlipidemia - Primary (Chronic)   Relevant Orders   Lipid Profile (Completed)    Other Visit Diagnoses    Class 3 severe obesity due to excess calories without serious comorbidity with body mass index (BMI) of 50.0 to 59.9 in adult [facility])       Relevant Orders   TSH (Completed)   Need for immunization against influenza       Relevant Orders   Flu Vaccine QUAD 36+ mos IM (Completed)        Follow-up: Return in about 6 months (around 07/04/2020) for f/u of high blood pressure and other chronic issues.    Miguel Aschoff, MD

## 2020-01-11 ENCOUNTER — Encounter: Payer: Self-pay | Admitting: Internal Medicine

## 2020-01-11 DIAGNOSIS — I749 Embolism and thrombosis of unspecified artery: Secondary | ICD-10-CM | POA: Insufficient documentation

## 2020-01-11 HISTORY — DX: Embolism and thrombosis of unspecified artery: I74.9

## 2020-01-11 MED ORDER — POTASSIUM CHLORIDE CRYS ER 10 MEQ PO TBCR: 10.0000 meq | EXTENDED_RELEASE_TABLET | Freq: Two times a day (BID) | ORAL | 3 refills | Status: DC

## 2020-01-11 MED ORDER — FAMOTIDINE 20 MG PO TABS: 20.0000 mg | ORAL_TABLET | Freq: Two times a day (BID) | ORAL | 1 refills | Status: DC

## 2020-01-11 MED ORDER — OLMESARTAN MEDOXOMIL 20 MG PO TABS: 20.0000 mg | ORAL_TABLET | Freq: Every day | ORAL | 3 refills | Status: DC

## 2020-01-11 NOTE — Assessment & Plan Note (Signed)
BMP today to monitor status.

## 2020-01-11 NOTE — Assessment & Plan Note (Signed)
Update lipid panel today 

## 2020-01-11 NOTE — Assessment & Plan Note (Signed)
Symptoms are uncontrolled, though she is not suicidal and is actively being managed by Dr. Evelene Croon who has recently reduced dose of trintillix in response to intolerance of higher dose.  She is spending nearly 24 hrs in bed, is neglecting hygiene and self care due to symptoms.  She has a support group and is encouraged to f/u with her treatment team.

## 2020-01-11 NOTE — Assessment & Plan Note (Signed)
No evidence R sided heart failure on exam. Monitor.

## 2020-01-11 NOTE — Assessment & Plan Note (Addendum)
BP 113/52 on hctz 25, olmesartan 20.  Amlodipine had been previously stopped.  BMP today, noted to be on K supp likely for losses from thiazide.

## 2020-01-11 NOTE — Assessment & Plan Note (Addendum)
Wt has been fairly stable; she is despondent about her obesity.  We didn't discuss weight loss today accordingly, as her mood must improve before she has the emotional capacity to address this. We might consider liraglutide as a weight loss intervention in the future (she is not diabetic).

## 2020-01-11 NOTE — Assessment & Plan Note (Signed)
Continues on Myrbetriq, oxybutynin ER (no anticholinergic SEs at this time), and TMP prescribed by [facility].

## 2020-01-11 NOTE — Assessment & Plan Note (Signed)
TSH and f/u US due.

## 2020-01-11 NOTE — Assessment & Plan Note (Addendum)
Does feel substernal burning though no backwash; does have a mild non productive cough when supine.  Rec trial of famotidine OTC. She agrees.

## 2020-01-11 NOTE — Assessment & Plan Note (Signed)
Oxygen requirement has resolved.  No longer using 02 (sats > 90% consistently).

## 2020-01-17 DIAGNOSIS — R0902 Hypoxemia: Secondary | ICD-10-CM | POA: Diagnosis not present

## 2020-01-17 DIAGNOSIS — R0682 Tachypnea, not elsewhere classified: Secondary | ICD-10-CM | POA: Diagnosis not present

## 2020-01-17 DIAGNOSIS — G4733 Obstructive sleep apnea (adult) (pediatric): Secondary | ICD-10-CM | POA: Diagnosis not present

## 2020-01-17 DIAGNOSIS — Z86718 Personal history of other venous thrombosis and embolism: Secondary | ICD-10-CM | POA: Diagnosis not present

## 2020-01-17 DIAGNOSIS — R0602 Shortness of breath: Secondary | ICD-10-CM | POA: Diagnosis not present

## 2020-01-17 DIAGNOSIS — I272 Pulmonary hypertension, unspecified: Secondary | ICD-10-CM | POA: Diagnosis not present

## 2020-01-17 DIAGNOSIS — R0603 Acute respiratory distress: Secondary | ICD-10-CM | POA: Diagnosis not present

## 2020-01-17 DIAGNOSIS — J9611 Chronic respiratory failure with hypoxia: Secondary | ICD-10-CM | POA: Diagnosis not present

## 2020-01-19 DIAGNOSIS — G4733 Obstructive sleep apnea (adult) (pediatric): Secondary | ICD-10-CM | POA: Diagnosis not present

## 2020-01-25 DIAGNOSIS — Z86718 Personal history of other venous thrombosis and embolism: Secondary | ICD-10-CM | POA: Diagnosis not present

## 2020-01-25 DIAGNOSIS — R0602 Shortness of breath: Secondary | ICD-10-CM | POA: Diagnosis not present

## 2020-01-25 DIAGNOSIS — I272 Pulmonary hypertension, unspecified: Secondary | ICD-10-CM | POA: Diagnosis not present

## 2020-01-25 DIAGNOSIS — R0682 Tachypnea, not elsewhere classified: Secondary | ICD-10-CM | POA: Diagnosis not present

## 2020-01-25 DIAGNOSIS — G4733 Obstructive sleep apnea (adult) (pediatric): Secondary | ICD-10-CM | POA: Diagnosis not present

## 2020-01-25 DIAGNOSIS — J9611 Chronic respiratory failure with hypoxia: Secondary | ICD-10-CM | POA: Diagnosis not present

## 2020-01-25 DIAGNOSIS — R0902 Hypoxemia: Secondary | ICD-10-CM | POA: Diagnosis not present

## 2020-01-25 DIAGNOSIS — R0603 Acute respiratory distress: Secondary | ICD-10-CM | POA: Diagnosis not present

## 2020-02-01 ENCOUNTER — Other Ambulatory Visit: Payer: Self-pay | Admitting: Internal Medicine

## 2020-02-01 ENCOUNTER — Telehealth: Payer: Self-pay

## 2020-02-01 DIAGNOSIS — I10 Essential (primary) hypertension: Secondary | ICD-10-CM

## 2020-02-01 MED ORDER — AMLODIPINE BESYLATE 5 MG PO TABS: 5.0000 mg | ORAL_TABLET | Freq: Every day | ORAL | 3 refills | Status: DC

## 2020-02-01 NOTE — Telephone Encounter (Signed)
She is correct.  She should be taking the HCTZ but not the amlodipine (the amlodipine was stopped months ago).  Her BP was at target range on 01/05/20.  Curiously the HCTZ isn't on her med list but I documented that she was taking it based on her medication list which she brought with her on day of visit.  Are there specific concerns? Feeling dizzy, concerned she made a mistake, doesn't recall our conversation.Marland KitchenMarland Kitchen ?

## 2020-02-01 NOTE — Telephone Encounter (Signed)
Requesting to speak with a nurse about meds. Please call pt back.  

## 2020-02-01 NOTE — Telephone Encounter (Signed)
Pt calls today to say she got mixed up and continued to take hctz and stopped amlodipine in April. Her Bp at 11/4 visit was 113/52. How would you like to go forward? Pt is very concerned.

## 2020-02-02 ENCOUNTER — Encounter: Payer: Self-pay | Admitting: Dietician

## 2020-02-02 ENCOUNTER — Ambulatory Visit (INDEPENDENT_AMBULATORY_CARE_PROVIDER_SITE_OTHER): Payer: Medicare HMO | Admitting: Dietician

## 2020-02-02 DIAGNOSIS — N182 Chronic kidney disease, stage 2 (mild): Secondary | ICD-10-CM | POA: Diagnosis not present

## 2020-02-02 NOTE — Progress Notes (Signed)
Medical Nutrition Therapy:   Appt start time: 1340 end time:  1435 Total time:55 minutes Visit # 11  Goals- weight loss and maintenance with improved physical and mental health. "Self care" Lowest weight has been 340# Expected outcomes/Reasons for goals- improve and maintain her independence  Assessment: Ms. Brecht presents today saying " I am having a bad day" . She continues to use myfitnesspal regularly to record her food intake and physical activity with RDN feedback weekly. Unclear if she is continuing to work on emotional eating with her therapist, continues with peer group support meetings weekly and only sees her psychiatrist. She asks for assistance with in home support saying she is not keeping up with her hygiene and says this is limiting her social contacts. She states she is moving less and staying in bed more.  She states she has looked into help in her home and does not qualify for financial assistance to help her with the cost of it. Her weight loss has slowed.  She feels she has gained fluid and questions her blood pressure medicine changes as being contributory. She has lost a total of 31# thus far.   ANTHROPOMETRICS: Estimated body mass index is 53.92 kg/m as calculated from the following:   Height as of 01/05/20: 5\' 8"  (1.727 m).   Weight as of this encounter: 354 lb 9.6 oz (160.8 kg).   Wt Readings from Last 10 Encounters:  02/02/20 (!) 354 lb 9.6 oz (160.8 kg)  01/05/20 (!) 355 lb 1.6 oz (161.1 kg)  12/22/19 (!) 351 lb 4.8 oz (159.3 kg)  11/10/19 (!) 353 lb 8 oz (160.3 kg)  09/29/19 (!) 352 lb 14.4 oz (160.1 kg)  08/09/19 (!) 359 lb 6.4 oz (163 kg)  07/14/19 (!) 360 lb 12.8 oz (163.7 kg)  06/23/19 (!) 362 lb 8 oz (164.4 kg)  06/06/19 (!) 364 lb 6.4 oz (165.3 kg)  05/26/19 (!) 365 lb 14.4 oz (166 kg)  Was 385.6# at the start and has achieved a 31# weight loss total and seems to be maintaining that loss.  SLEEP: having troubles sleeping every other night and uses  food to help her sleep; continues to sleep during the day and night  DIETARY INTAKE: Recording consistently using My fitness PAL; her calories, protein, fiber, fat have remained close to her target  With a few episodes of very high calories intake from binges  Usual physical activity: improving fitness noted by her walking all the way to the office and back to her vehicle today. Her goal is eventually at least 22 minutes daily walking in her hallways or parking lot  Progress Towards Goal(s):  In progress.   Nutritional Diagnosis:  NB-1.5 Disordered eating pattern As related to eating one meal a day and binging at other times has reoccured As evidenced by her report and food records.    Intervention:  Nutrition counseling using motivational interviewing to assist her with evaluating her progress and goals.  Action Goal: continue to log her food and increase activity as she can  Outcome goal: Gain confidence and continue progress towards consistent meal and activity tracking, long term weight loss maintenance to reach/maintian her desired quality of life  Coordination of care: spoke with social worker, recommended our behavioral therapist, speak with her primary care doctor about PT/OT Teaching Method Utilized: visual and  Auditory Handouts given during visit include:  After visit summary Barriers to learning/adherence to lifestyle change: depression and life stressors Demonstrated degree of understanding via:  Verbalization  Monitoring/Evaluation:  Dietary intake and body weight 6 weeks Norm Parcel, RD 02/02/2020 3:00 PM.

## 2020-02-02 NOTE — Patient Instructions (Addendum)
I will ask Dr. Mayford Knife about a referral to Physical Therapy and Occupational therapy.   I'll also ask her about the blood pressure medicines.   You are going to work  Self care and logging your food regularly  Trying to eat balanced meals with veggies and protein.  Lupita Leash [phone]

## 2020-02-03 DIAGNOSIS — N3942 Incontinence without sensory awareness: Secondary | ICD-10-CM | POA: Diagnosis not present

## 2020-02-03 DIAGNOSIS — N3946 Mixed incontinence: Secondary | ICD-10-CM | POA: Diagnosis not present

## 2020-02-10 ENCOUNTER — Encounter: Payer: Self-pay | Admitting: *Deleted

## 2020-02-10 NOTE — Progress Notes (Unsigned)

## 2020-02-14 ENCOUNTER — Other Ambulatory Visit: Payer: Self-pay | Admitting: Internal Medicine

## 2020-02-14 ENCOUNTER — Encounter: Payer: Self-pay | Admitting: Internal Medicine

## 2020-02-14 DIAGNOSIS — F322 Major depressive disorder, single episode, severe without psychotic features: Secondary | ICD-10-CM

## 2020-02-14 NOTE — Progress Notes (Signed)
Rec'd copy of urology visit 02/03/20 from [facility], Dr. Sherron Monday.  Mixed urinary incontinence with predominant overactive bladder component being treated with dual therapy including Myrbetric and Oxybutynin along with daily suppressive TMP.  Refills given for 1 yr, f/u 1 yr.

## 2020-02-15 ENCOUNTER — Other Ambulatory Visit: Payer: Self-pay | Admitting: Internal Medicine

## 2020-02-15 DIAGNOSIS — J9611 Chronic respiratory failure with hypoxia: Secondary | ICD-10-CM

## 2020-02-16 DIAGNOSIS — R0603 Acute respiratory distress: Secondary | ICD-10-CM | POA: Diagnosis not present

## 2020-02-16 DIAGNOSIS — I272 Pulmonary hypertension, unspecified: Secondary | ICD-10-CM | POA: Diagnosis not present

## 2020-02-16 DIAGNOSIS — R0602 Shortness of breath: Secondary | ICD-10-CM | POA: Diagnosis not present

## 2020-02-16 DIAGNOSIS — J9611 Chronic respiratory failure with hypoxia: Secondary | ICD-10-CM | POA: Diagnosis not present

## 2020-02-16 DIAGNOSIS — Z86718 Personal history of other venous thrombosis and embolism: Secondary | ICD-10-CM | POA: Diagnosis not present

## 2020-02-16 DIAGNOSIS — R0902 Hypoxemia: Secondary | ICD-10-CM | POA: Diagnosis not present

## 2020-02-16 DIAGNOSIS — R0682 Tachypnea, not elsewhere classified: Secondary | ICD-10-CM | POA: Diagnosis not present

## 2020-02-16 DIAGNOSIS — G4733 Obstructive sleep apnea (adult) (pediatric): Secondary | ICD-10-CM | POA: Diagnosis not present

## 2020-02-17 ENCOUNTER — Telehealth: Payer: Self-pay | Admitting: Dietician

## 2020-02-17 NOTE — Telephone Encounter (Signed)
Notified [name] that home PT/OT/Aide services have been ordered on her behalf. I also let her know that Dr. Walker Kehr would like to discuss her blood pressure medicines in a furture ev ist when she brings all of her meidicne in to the visit. She verbalized understanding.

## 2020-02-18 DIAGNOSIS — E782 Mixed hyperlipidemia: Secondary | ICD-10-CM | POA: Diagnosis not present

## 2020-02-18 DIAGNOSIS — M170 Bilateral primary osteoarthritis of knee: Secondary | ICD-10-CM | POA: Diagnosis not present

## 2020-02-18 DIAGNOSIS — J439 Emphysema, unspecified: Secondary | ICD-10-CM | POA: Diagnosis not present

## 2020-02-18 DIAGNOSIS — F322 Major depressive disorder, single episode, severe without psychotic features: Secondary | ICD-10-CM | POA: Diagnosis not present

## 2020-02-18 DIAGNOSIS — N182 Chronic kidney disease, stage 2 (mild): Secondary | ICD-10-CM | POA: Diagnosis not present

## 2020-02-18 DIAGNOSIS — I129 Hypertensive chronic kidney disease with stage 1 through stage 4 chronic kidney disease, or unspecified chronic kidney disease: Secondary | ICD-10-CM | POA: Diagnosis not present

## 2020-02-18 DIAGNOSIS — G4733 Obstructive sleep apnea (adult) (pediatric): Secondary | ICD-10-CM | POA: Diagnosis not present

## 2020-02-18 DIAGNOSIS — J9611 Chronic respiratory failure with hypoxia: Secondary | ICD-10-CM | POA: Diagnosis not present

## 2020-02-20 ENCOUNTER — Encounter: Payer: Self-pay | Admitting: Internal Medicine

## 2020-02-20 DIAGNOSIS — N3946 Mixed incontinence: Secondary | ICD-10-CM

## 2020-02-20 NOTE — Assessment & Plan Note (Signed)
02/2020 urology office note scanned to Media Tab

## 2020-02-22 ENCOUNTER — Telehealth: Payer: Self-pay | Admitting: *Deleted

## 2020-02-22 ENCOUNTER — Other Ambulatory Visit: Payer: Self-pay

## 2020-02-22 DIAGNOSIS — J9611 Chronic respiratory failure with hypoxia: Secondary | ICD-10-CM | POA: Diagnosis not present

## 2020-02-22 DIAGNOSIS — M170 Bilateral primary osteoarthritis of knee: Secondary | ICD-10-CM | POA: Diagnosis not present

## 2020-02-22 DIAGNOSIS — E782 Mixed hyperlipidemia: Secondary | ICD-10-CM | POA: Diagnosis not present

## 2020-02-22 DIAGNOSIS — J439 Emphysema, unspecified: Secondary | ICD-10-CM | POA: Diagnosis not present

## 2020-02-22 DIAGNOSIS — F322 Major depressive disorder, single episode, severe without psychotic features: Secondary | ICD-10-CM | POA: Diagnosis not present

## 2020-02-22 DIAGNOSIS — G4733 Obstructive sleep apnea (adult) (pediatric): Secondary | ICD-10-CM | POA: Diagnosis not present

## 2020-02-22 DIAGNOSIS — N182 Chronic kidney disease, stage 2 (mild): Secondary | ICD-10-CM | POA: Diagnosis not present

## 2020-02-22 DIAGNOSIS — I129 Hypertensive chronic kidney disease with stage 1 through stage 4 chronic kidney disease, or unspecified chronic kidney disease: Secondary | ICD-10-CM | POA: Diagnosis not present

## 2020-02-22 NOTE — Telephone Encounter (Signed)
Yes

## 2020-02-22 NOTE — Telephone Encounter (Signed)
VO for HH OT 1x week for 8 weeks for safety and adl's Also needs bedside commode, states order can go to DME of providers choice Do you agree?  Sending to dr Vida Roller and laurenD.

## 2020-02-23 ENCOUNTER — Other Ambulatory Visit: Payer: Self-pay | Admitting: Internal Medicine

## 2020-02-23 ENCOUNTER — Encounter: Payer: Self-pay | Admitting: Internal Medicine

## 2020-02-23 DIAGNOSIS — Z7409 Other reduced mobility: Secondary | ICD-10-CM

## 2020-02-23 NOTE — Progress Notes (Signed)
I received communication from [facility] with medication interaction concerns.  The following concerns were noted:  A)  Bupropion and Trintellix-level 2 severe interaction potential B)  Doxepin and potassium-level 2 severe interaction potential C)  Ditropan and potassium-level 2 severe interaction potential  Bupropion and Trintellix is prescribed by her psychiatrist who has  reviewed potential adverse events and feels the benefit outweighs risk.  Patient should no longer be taking potassium once HCTZ was stopped and amlodipine resumed.    Interactions were investigated and are unlikely.   A)  Trintellix can alter metabolism so that level of bupropion is higher than prescribed. B)  Doxepin, if it causes a seizure (unlikely), can lead to increased potassium levels when administered in addition to KCl supplementation. C)  Ditropan can cause slowed bowel motility leading to delayed transit of K in the gut and cause ulceration.  These are low potential adverse outcomes, and again should not be an issue since Kcl was stopped (it may have been automatically refilled, however, and delivered to her home - we will investigate this and also have her come in early in January for a BP and BMP recheck.

## 2020-02-23 NOTE — Telephone Encounter (Signed)
Please place order for Bariatric Bedside Commode. Also put patient's HT 5'8" and WT 354 lbs. Please let me know when this has been done and I will notify [employer]. Thank you.

## 2020-02-23 NOTE — Telephone Encounter (Signed)
Order is placed.  I haven't ordered DME before, please let me know if it was entered correctly.  Many thanks, Lauren!

## 2020-02-27 ENCOUNTER — Telehealth: Payer: Self-pay | Admitting: Internal Medicine

## 2020-02-27 ENCOUNTER — Encounter: Payer: Self-pay | Admitting: Internal Medicine

## 2020-02-27 DIAGNOSIS — J9611 Chronic respiratory failure with hypoxia: Secondary | ICD-10-CM | POA: Diagnosis not present

## 2020-02-27 DIAGNOSIS — G4733 Obstructive sleep apnea (adult) (pediatric): Secondary | ICD-10-CM | POA: Diagnosis not present

## 2020-02-27 DIAGNOSIS — N182 Chronic kidney disease, stage 2 (mild): Secondary | ICD-10-CM | POA: Diagnosis not present

## 2020-02-27 DIAGNOSIS — J439 Emphysema, unspecified: Secondary | ICD-10-CM | POA: Diagnosis not present

## 2020-02-27 DIAGNOSIS — F322 Major depressive disorder, single episode, severe without psychotic features: Secondary | ICD-10-CM | POA: Diagnosis not present

## 2020-02-27 DIAGNOSIS — I129 Hypertensive chronic kidney disease with stage 1 through stage 4 chronic kidney disease, or unspecified chronic kidney disease: Secondary | ICD-10-CM | POA: Diagnosis not present

## 2020-02-27 DIAGNOSIS — M170 Bilateral primary osteoarthritis of knee: Secondary | ICD-10-CM | POA: Diagnosis not present

## 2020-02-27 DIAGNOSIS — E782 Mixed hyperlipidemia: Secondary | ICD-10-CM | POA: Diagnosis not present

## 2020-02-27 NOTE — Telephone Encounter (Signed)
Please refer to message below.  Unable to contact patient via telephone.  No answer and voicemail was full.  Appointment has been made for 03/15/2020 at 9:45 am and mailed to patient.

## 2020-02-27 NOTE — Telephone Encounter (Signed)
-----   Message from Miguel Aschoff, MD sent at 02/23/2020  3:29 PM EST ----- Please have patient come to clinic for a quick blood pressure check and BMP.  SHe will need to bring all of her medications with her so that we have an accurate list, since she has multiple prescribers. Thank you!

## 2020-02-28 DIAGNOSIS — N182 Chronic kidney disease, stage 2 (mild): Secondary | ICD-10-CM | POA: Diagnosis not present

## 2020-02-28 DIAGNOSIS — J9611 Chronic respiratory failure with hypoxia: Secondary | ICD-10-CM | POA: Diagnosis not present

## 2020-02-28 DIAGNOSIS — E782 Mixed hyperlipidemia: Secondary | ICD-10-CM | POA: Diagnosis not present

## 2020-02-28 DIAGNOSIS — M170 Bilateral primary osteoarthritis of knee: Secondary | ICD-10-CM | POA: Diagnosis not present

## 2020-02-28 DIAGNOSIS — G4733 Obstructive sleep apnea (adult) (pediatric): Secondary | ICD-10-CM | POA: Diagnosis not present

## 2020-02-28 DIAGNOSIS — J439 Emphysema, unspecified: Secondary | ICD-10-CM | POA: Diagnosis not present

## 2020-02-28 DIAGNOSIS — F322 Major depressive disorder, single episode, severe without psychotic features: Secondary | ICD-10-CM | POA: Diagnosis not present

## 2020-02-28 DIAGNOSIS — I129 Hypertensive chronic kidney disease with stage 1 through stage 4 chronic kidney disease, or unspecified chronic kidney disease: Secondary | ICD-10-CM | POA: Diagnosis not present

## 2020-02-29 DIAGNOSIS — G4733 Obstructive sleep apnea (adult) (pediatric): Secondary | ICD-10-CM | POA: Diagnosis not present

## 2020-02-29 DIAGNOSIS — F322 Major depressive disorder, single episode, severe without psychotic features: Secondary | ICD-10-CM | POA: Diagnosis not present

## 2020-02-29 DIAGNOSIS — N182 Chronic kidney disease, stage 2 (mild): Secondary | ICD-10-CM | POA: Diagnosis not present

## 2020-02-29 DIAGNOSIS — M170 Bilateral primary osteoarthritis of knee: Secondary | ICD-10-CM | POA: Diagnosis not present

## 2020-02-29 DIAGNOSIS — I129 Hypertensive chronic kidney disease with stage 1 through stage 4 chronic kidney disease, or unspecified chronic kidney disease: Secondary | ICD-10-CM | POA: Diagnosis not present

## 2020-02-29 DIAGNOSIS — J9611 Chronic respiratory failure with hypoxia: Secondary | ICD-10-CM | POA: Diagnosis not present

## 2020-02-29 DIAGNOSIS — J439 Emphysema, unspecified: Secondary | ICD-10-CM | POA: Diagnosis not present

## 2020-02-29 DIAGNOSIS — E782 Mixed hyperlipidemia: Secondary | ICD-10-CM | POA: Diagnosis not present

## 2020-03-05 DIAGNOSIS — E782 Mixed hyperlipidemia: Secondary | ICD-10-CM | POA: Diagnosis not present

## 2020-03-05 DIAGNOSIS — J9611 Chronic respiratory failure with hypoxia: Secondary | ICD-10-CM | POA: Diagnosis not present

## 2020-03-05 DIAGNOSIS — M170 Bilateral primary osteoarthritis of knee: Secondary | ICD-10-CM | POA: Diagnosis not present

## 2020-03-05 DIAGNOSIS — N182 Chronic kidney disease, stage 2 (mild): Secondary | ICD-10-CM | POA: Diagnosis not present

## 2020-03-05 DIAGNOSIS — F322 Major depressive disorder, single episode, severe without psychotic features: Secondary | ICD-10-CM | POA: Diagnosis not present

## 2020-03-05 DIAGNOSIS — J439 Emphysema, unspecified: Secondary | ICD-10-CM | POA: Diagnosis not present

## 2020-03-05 DIAGNOSIS — G4733 Obstructive sleep apnea (adult) (pediatric): Secondary | ICD-10-CM | POA: Diagnosis not present

## 2020-03-05 DIAGNOSIS — I129 Hypertensive chronic kidney disease with stage 1 through stage 4 chronic kidney disease, or unspecified chronic kidney disease: Secondary | ICD-10-CM | POA: Diagnosis not present

## 2020-03-05 NOTE — Telephone Encounter (Signed)
Thank you :)

## 2020-03-06 DIAGNOSIS — N182 Chronic kidney disease, stage 2 (mild): Secondary | ICD-10-CM | POA: Diagnosis not present

## 2020-03-06 DIAGNOSIS — G4733 Obstructive sleep apnea (adult) (pediatric): Secondary | ICD-10-CM | POA: Diagnosis not present

## 2020-03-06 DIAGNOSIS — F322 Major depressive disorder, single episode, severe without psychotic features: Secondary | ICD-10-CM | POA: Diagnosis not present

## 2020-03-06 DIAGNOSIS — E782 Mixed hyperlipidemia: Secondary | ICD-10-CM | POA: Diagnosis not present

## 2020-03-06 DIAGNOSIS — I129 Hypertensive chronic kidney disease with stage 1 through stage 4 chronic kidney disease, or unspecified chronic kidney disease: Secondary | ICD-10-CM | POA: Diagnosis not present

## 2020-03-06 DIAGNOSIS — M170 Bilateral primary osteoarthritis of knee: Secondary | ICD-10-CM | POA: Diagnosis not present

## 2020-03-06 DIAGNOSIS — J439 Emphysema, unspecified: Secondary | ICD-10-CM | POA: Diagnosis not present

## 2020-03-06 DIAGNOSIS — J9611 Chronic respiratory failure with hypoxia: Secondary | ICD-10-CM | POA: Diagnosis not present

## 2020-03-07 DIAGNOSIS — J9611 Chronic respiratory failure with hypoxia: Secondary | ICD-10-CM | POA: Diagnosis not present

## 2020-03-07 DIAGNOSIS — N182 Chronic kidney disease, stage 2 (mild): Secondary | ICD-10-CM | POA: Diagnosis not present

## 2020-03-07 DIAGNOSIS — M170 Bilateral primary osteoarthritis of knee: Secondary | ICD-10-CM | POA: Diagnosis not present

## 2020-03-07 DIAGNOSIS — E782 Mixed hyperlipidemia: Secondary | ICD-10-CM | POA: Diagnosis not present

## 2020-03-07 DIAGNOSIS — J439 Emphysema, unspecified: Secondary | ICD-10-CM | POA: Diagnosis not present

## 2020-03-07 DIAGNOSIS — F322 Major depressive disorder, single episode, severe without psychotic features: Secondary | ICD-10-CM | POA: Diagnosis not present

## 2020-03-07 DIAGNOSIS — I129 Hypertensive chronic kidney disease with stage 1 through stage 4 chronic kidney disease, or unspecified chronic kidney disease: Secondary | ICD-10-CM | POA: Diagnosis not present

## 2020-03-07 DIAGNOSIS — G4733 Obstructive sleep apnea (adult) (pediatric): Secondary | ICD-10-CM | POA: Diagnosis not present

## 2020-03-08 DIAGNOSIS — G4733 Obstructive sleep apnea (adult) (pediatric): Secondary | ICD-10-CM | POA: Diagnosis not present

## 2020-03-08 DIAGNOSIS — J9611 Chronic respiratory failure with hypoxia: Secondary | ICD-10-CM | POA: Diagnosis not present

## 2020-03-08 DIAGNOSIS — I129 Hypertensive chronic kidney disease with stage 1 through stage 4 chronic kidney disease, or unspecified chronic kidney disease: Secondary | ICD-10-CM | POA: Diagnosis not present

## 2020-03-08 DIAGNOSIS — N182 Chronic kidney disease, stage 2 (mild): Secondary | ICD-10-CM | POA: Diagnosis not present

## 2020-03-08 DIAGNOSIS — E782 Mixed hyperlipidemia: Secondary | ICD-10-CM | POA: Diagnosis not present

## 2020-03-08 DIAGNOSIS — J439 Emphysema, unspecified: Secondary | ICD-10-CM | POA: Diagnosis not present

## 2020-03-08 DIAGNOSIS — F322 Major depressive disorder, single episode, severe without psychotic features: Secondary | ICD-10-CM | POA: Diagnosis not present

## 2020-03-08 DIAGNOSIS — M170 Bilateral primary osteoarthritis of knee: Secondary | ICD-10-CM | POA: Diagnosis not present

## 2020-03-08 NOTE — Telephone Encounter (Signed)
CM sent to [facility] at [facility] for Bariatric [facility]. This was ordered based on the recommendation of patient's Home Health Occupational Therapist documented in phone note on 02/22/2020. Kinnie Feil, BSN, RN-BC

## 2020-03-08 NOTE — Telephone Encounter (Signed)
Samples, Beverly  Denesha Brouse L, RN; Samples, Beverly; Lewis, Leah; Stenson, Melissa; New, [place]; 1 other  got it, reviewing now. thanks.   

## 2020-03-09 DIAGNOSIS — Z736 Limitation of activities due to disability: Secondary | ICD-10-CM | POA: Diagnosis not present

## 2020-03-09 NOTE — Telephone Encounter (Signed)
Samples, [employer], [place]; Wilcox, Nickola Major, RN; Charlton Amor; Gust Rung, [place]; [employer], [place]; 1 other  order for bariatric commode created and sent to our processor team. thanks.

## 2020-03-12 ENCOUNTER — Encounter: Payer: Self-pay | Admitting: Internal Medicine

## 2020-03-12 DIAGNOSIS — G4733 Obstructive sleep apnea (adult) (pediatric): Secondary | ICD-10-CM | POA: Diagnosis not present

## 2020-03-12 DIAGNOSIS — E782 Mixed hyperlipidemia: Secondary | ICD-10-CM | POA: Diagnosis not present

## 2020-03-12 DIAGNOSIS — N182 Chronic kidney disease, stage 2 (mild): Secondary | ICD-10-CM | POA: Diagnosis not present

## 2020-03-12 DIAGNOSIS — J9611 Chronic respiratory failure with hypoxia: Secondary | ICD-10-CM | POA: Diagnosis not present

## 2020-03-12 DIAGNOSIS — F322 Major depressive disorder, single episode, severe without psychotic features: Secondary | ICD-10-CM | POA: Diagnosis not present

## 2020-03-12 DIAGNOSIS — M170 Bilateral primary osteoarthritis of knee: Secondary | ICD-10-CM | POA: Diagnosis not present

## 2020-03-12 DIAGNOSIS — J439 Emphysema, unspecified: Secondary | ICD-10-CM | POA: Diagnosis not present

## 2020-03-12 DIAGNOSIS — I129 Hypertensive chronic kidney disease with stage 1 through stage 4 chronic kidney disease, or unspecified chronic kidney disease: Secondary | ICD-10-CM | POA: Diagnosis not present

## 2020-03-12 NOTE — Progress Notes (Signed)
Things That May Be Affecting Your Health:  Alcohol  Hearing loss  Pain    Depression  Home Safety  Sexual Health   Diabetes  Lack of physical activity  Stress   Difficulty with daily activities  Loneliness  Tiredness   Drug use  Medicines  Tobacco use   Falls  Motor Vehicle Safety  Weight   Food choices  Oral Health  Other    YOUR PERSONALIZED HEALTH PLAN : 1. Schedule your next subsequent Medicare Wellness visit in one year 2. Attend all of your regular appointments to address your medical issues 3. Complete the preventative screenings and services   Annual Wellness Visit   Medicare Covered Preventative Screenings and Services  Services & Screenings Men and Women Who How Often Need? Date of Last Service Action  Abdominal Aortic Aneurysm Adults with AAA risk factors Once     Alcohol Misuse and Counseling All Adults Screening once a year if no alcohol misuse. Counseling up to 4 face to face sessions.     Bone Density Measurement  Adults at risk for osteoporosis Once every 2 yrs     Lipid Panel Z13.6 All adults without CV disease Once every 5 yrs  01/2020   Colorectal Cancer   Stool sample or  Colonoscopy All adults 50 and older   Once every year  Every 10 years  Due 2025   Depression All Adults Once a year X (though she has an established diagnosis) Today   Diabetes Screening Blood glucose, post glucose load, or GTT Z13.1  All adults at risk  Pre-diabetics  Once per year  Twice per year     Diabetes  Self-Management Training All adults Diabetics 10 hrs first year; 2 hours subsequent years. Requires Copay     Glaucoma  Diabetics  Family history of glaucoma  African Americans 50 yrs +  Hispanic Americans 65 yrs + Annually - requires coppay x    Hepatitis C Z72.89 or F19.20  High Risk for HCV  Born between 1945 and 1965  Annually  Once Needs Hep C screen    HIV Z11.4 All adults based on risk  Annually btw ages 30 & 20 regardless of risk  Annually > 65 yrs  if at increased risk     Lung Cancer Screening Asymptomatic adults aged 58-77 with 30 pack yr history and current smoker OR quit within the last 15 yrs Annually Must have counseling and shared decision making documentation before first screen X    Medical Nutrition Therapy Adults with   Diabetes  Renal disease  Kidney transplant within past 3 yrs 3 hours first year; 2 hours subsequent years     Obesity and Counseling All adults Screening once a year Counseling if BMI 30 or higher x Today   Tobacco Use Counseling Adults who use tobacco  Up to 8 visits in one year x    Vaccines Z23  Hepatitis B  Influenza   Pneumonia  Adults   Once  Once every flu season  Two different vaccines separated by one year Needs TDaP (due 2020)    Next Annual Wellness Visit People with Medicare Every year  Today     Services & Screenings Women Who How Often Need  Date of Last Service Action  Mammogram  Z12.31 Women over 40 One baseline ages 50-39. Annually ager 40 yrs+ Due 04/2020    Pap tests All women Annually if high risk. Every 2 yrs for normal risk women  Screening for cervical cancer with   Pap (Z01.419 nl or Z01.411abnl) &  HPV Z11.51 Women aged 6 to 7 Once every 5 yrs     Screening pelvic and breast exams All women Annually if high risk. Every 2 yrs for normal risk women     Sexually Transmitted Diseases  Chlamydia  Gonorrhea  Syphilis All at risk adults Annually for non pregnant females at increased risk         [place] Men Who How Ofter Need  Date of Last Service Action  Prostate Cancer - DRE & PSA Men over 50 Annually.  DRE might require a copay.     Sexually Transmitted Diseases  Syphilis All at risk adults Annually for men at increased risk

## 2020-03-14 ENCOUNTER — Encounter: Payer: Self-pay | Admitting: Internal Medicine

## 2020-03-14 DIAGNOSIS — M170 Bilateral primary osteoarthritis of knee: Secondary | ICD-10-CM | POA: Diagnosis not present

## 2020-03-14 DIAGNOSIS — I129 Hypertensive chronic kidney disease with stage 1 through stage 4 chronic kidney disease, or unspecified chronic kidney disease: Secondary | ICD-10-CM | POA: Diagnosis not present

## 2020-03-14 DIAGNOSIS — N182 Chronic kidney disease, stage 2 (mild): Secondary | ICD-10-CM | POA: Diagnosis not present

## 2020-03-14 DIAGNOSIS — G4733 Obstructive sleep apnea (adult) (pediatric): Secondary | ICD-10-CM | POA: Diagnosis not present

## 2020-03-14 DIAGNOSIS — E782 Mixed hyperlipidemia: Secondary | ICD-10-CM | POA: Diagnosis not present

## 2020-03-14 DIAGNOSIS — F322 Major depressive disorder, single episode, severe without psychotic features: Secondary | ICD-10-CM | POA: Diagnosis not present

## 2020-03-14 DIAGNOSIS — J9611 Chronic respiratory failure with hypoxia: Secondary | ICD-10-CM | POA: Diagnosis not present

## 2020-03-14 DIAGNOSIS — J439 Emphysema, unspecified: Secondary | ICD-10-CM | POA: Diagnosis not present

## 2020-03-14 NOTE — Progress Notes (Signed)
Established Patient Office Visit  Subjective:  Patient ID: Taygen Newsome, female    DOB: Aug 02, 1945  Age: 75 y.o. MRN: [id number]  CC:  Problems catching breath  HPI Shatina Erna Brossard presents for scheduled f/u of hypertension, depression, functional impairment, and potassium level.  There has been confusion about which blood pressure medicines she is intended to be taking.  I wanted to address her declining physical function (in part related to depressed mood), for which home health PT OT began sessions a couple of weeks ago, and she now has a home aide who provides 1 hour of assistance per week.  She is feeling improvement in her ability to move about.  Her last fall was in September, associated with difficulty arising from a low toilet.  She is a tall woman at 5 feet 8 inches, and as I understand an elevated toilet seat with support bars will be provided.  She feels that she is able to breathe more comfortably and deeply, she has noticed some improvement in chronic right leg pain, and she notices a comfortable desire to stretch her muscles upon awakening in the morning, which has a positive effect on her sense of wellbeing.  She continues to to prefer staying in the bed for the majority of her day, although PT and OT are working with her on moving about more frequently and sitting in a chair.  She does want to report the following however: She has had a few brief (seconds) episodes of feeling like she cannot catch her breath easily, which she has noticed followed OT exercises where she was elevating her arms over her head.  Her home pulse ox monitor indicated that her HR was above 100.  When she lie down to rest, her symptoms resolved and HR normalized.  This has not been associated with chest pain/pressure or with cough.  It has not happened during speech.  She has not felt anxious during these episodes, which is important as "I read on the Internet that the symptoms can be a panic  attack, but I have not felt panicked".  She also brings to my attention a couple of episodes of fleeting abnormal head sensation "like when you take a new medicine for your depression".  No headache, dizziness, change in vision or hearing, and the episodes happened at rest.  She is not on any new medications.  She has had such sensations before, and is not particularly worried at this time.  She reports her mood has been fair.  PT and OT have helped. Sees psychiatrist in next couple of months.  Feels like meds are ok, but doesn't share much.  Due for TDaP, f/u Covid status - needs booster.  Hasn't wanted to leave house to get the shot "I like my bed!" . Due for f/u TSH (elevated last visit) and for annual [place] of thyroid nodule in 06/2019.  Urge and functional urinary incontinence- saw urologist in December.  Still taking oxybutyinin and mirbegetron. Ran out of TMP.  Meds are helping her hold her urge more effectively.  She wishes to remain on TMP "sometimes I cannot get myself completely clean, I do not want to get an infection".  Patient Active Problem List   Diagnosis Date Noted   Multinodular goiter 06/21/2019   Centrilobular emphysema (HCC) 03/12/2019   Abdominal aortic atherosclerosis (HCC) 02/13/2019   Chronic cough 02/04/2018   No natural teeth 12/18/2016   Mixed incontinence 04/05/2015   Chronic kidney disease, stage 3 unspecified (  HCC) 07/28/2014   Osteopenia 03/08/2014   Preventative health care 10/21/2010   Hyperlipidemia 03/07/2006   Morbid obesity with body mass index (BMI) of 50.0 to 59.9 in adult (HCC) 01/19/2006   Severe major depression (HCC) 01/19/2006   OSA (obstructive sleep apnea) 01/19/2006   HTN (hypertension) 01/19/2006   OA (osteoarthritis) of knee 01/19/2006   History of hepatitis B 01/19/2006   PULMONARY EMBOLISM, HX OF 01/19/2006    Family History  Problem Relation Age of Onset   Hypertension Other        family history   Kidney disease  Maternal Uncle    Parkinson's disease Brother    Hypertension Mother    Social history-she continues to live alone.  Remote smoking history.  She drives to complete her errands and shopping.  Outpatient Medications Prior to Visit  Medication Sig Dispense Refill   ALPRAZolam (XANAX) 1 MG tablet Take 1-2 tablets by mouth daily as needed for anxiety.     amLODipine (NORVASC) 5 MG tablet Take 1 tablet (5 mg total) by mouth daily. 90 tablet 3   buPROPion (WELLBUTRIN SR) 150 MG 12 hr tablet Take 150 mg by mouth in the morning and at bedtime.     doxepin (SINEQUAN) 10 MG capsule Take 10 mg by mouth at bedtime. Take one to two capsules by mouth at bedtime     famotidine (PEPCID) 20 MG tablet Take 1 tablet (20 mg total) by mouth 2 (two) times daily. 60 tablet 1   Multiple Vitamin (MULTIVITAMIN) tablet Take 1 tablet by mouth daily. (Patient taking differently: Take 1 tablet by mouth every evening. ) 30 tablet 3   olmesartan (BENICAR) 20 MG tablet Take 1 tablet (20 mg total) by mouth daily. 90 tablet 3   Omega-3 Fatty Acids (OMEGA-3 FISH OIL PO) Take 1 capsule by mouth every evening.      oxybutynin (DITROPAN-XL) 10 MG 24 hr tablet Take 10 mg by mouth at bedtime.     pravastatin (PRAVACHOL) 40 MG tablet TAKE 1 TABLET EVERY DAY 90 tablet 3   trimethoprim (TRIMPEX) 100 MG tablet Take 100 mg by mouth at bedtime.     vortioxetine HBr (TRINTELLIX) 20 MG TABS tablet Take 5 mg by mouth every evening.     XARELTO 20 MG TABS tablet TAKE 1 TABLET EVERY DAY WITH SUPPER 90 tablet 3    ROS as per HPI and problem based charting    Objective:    Physical Exam  There were no vitals taken for this visit. Wt Readings from Last 3 Encounters:  02/02/20 (!) 354 lb 9.6 oz (160.8 kg)  01/05/20 (!) 355 lb 1.6 oz (161.1 kg)  12/22/19 (!) 351 lb 4.8 oz (159.3 kg)   Ms. Harrow is well-appearing.  Obese habitus, sitting in chair, with her Rollator nearby.  Pushes off with hands to arise and walks in a  slow gait without ataxia.  Mental status is awake and alert, she is appropriately conversant, with full affect.  Speech is clear and she recalls details of history largely accurately.  There is very mild proptosis of the left eye.  I do not identify any palpable thyroid nodules on neck exam.  Radial pulses are 1+, feet and hands are warm, heart rate is around 100 per count.  Lungs are clear throughout with nonlabored respirations.  No JVD in sitting position.  Heart fast regular, no significant murmur (very very soft early systolic murmur RUSB), skin turgor is full in the legs with mild  nonpitting edema.  No tremor.  During this approximately 1 hour visit, she frequently would develop dyspnea while speaking.  This was relieved by having her remove her mask, fanning her for air movement, and opening up the door.  She was not feeling anxious during these episodes, which were not accompanied by sense of palpitations or chest pressure.  No dizziness.  She did not eat or drink this morning prior to visit.  Health Maintenance Due  Topic Date Due   COLONOSCOPY (Pts 45-46yrs Insurance coverage will need to be confirmed)  11/07/2014   COLON CANCER SCREENING ANNUAL FOBT  07/20/2017   TETANUS/TDAP  01/16/2019   COVID-19 Vaccine (3 - Booster for Pfizer series) 12/22/2019    Lab Results  Component Value Date   TSH 7.190 (H) 01/05/2020    Lab Results  Component Value Date   NA 138 01/05/2020   K 4.7 01/05/2020   CO2 16 (L) 01/05/2020   GLUCOSE 117 (H) 01/05/2020   BUN 24 01/05/2020   CREATININE 1.40 (H) 01/05/2020   BILITOT 0.3 10/14/2018   ALKPHOS 81 10/14/2018   AST 24 10/14/2018   ALT 17 10/14/2018   PROT 7.2 10/14/2018   ALBUMIN 4.1 10/14/2018   CALCIUM 9.6 01/05/2020   ANIONGAP 8 12/03/2016   Lab Results  Component Value Date   CHOL 156 01/05/2020   Lab Results  Component Value Date   HDL 43 01/05/2020   Lab Results  Component Value Date   LDLCALC 98 01/05/2020   Lab Results   Component Value Date   TRIG 77 01/05/2020   Lab Results  Component Value Date   CHOLHDL 3.6 01/05/2020   Lab Results  Component Value Date   HGBA1C 5.7 (A) 02/04/2019      Assessment & Plan:   See problem list for problem based charting.  Notable today is finding of overly controlled hypertension.  She is asymptomatic in the upright position but we will be discontinuing her olmesartan.  She will take her blood pressure at home and I will contact her tomorrow to discuss her readings and to share the results of her blood testing today.  Note that she does not feel ill.  She has normal oxygenation at rest and with ambulation.  She is not clinically hypovolemic.  Tdap done today.  Overdue for annual mammogram.  Flex sig cancer screening was done 2 years ago.  Needs COVID booster.  I spoke with her about the PACE program-she had already looked into this and found the program to be unfortunately unaffordable at this time.  She brought up a family history of dementia and notes that she has been episodically forgetful though nothing that is interfered with her functional ability.  We will screen for cognitive impairment at a subsequent visit once her medical issues are squared away.  She is enrolled in a study at [facility] which she thinks involves cognition but isn't sure.  Follow-up: I will call her tomorrow to provide lab results and to follow-up on home blood pressure readings.  OV in about a month.   Miguel Aschoff, MD

## 2020-03-15 ENCOUNTER — Other Ambulatory Visit: Payer: Self-pay

## 2020-03-15 ENCOUNTER — Encounter: Payer: Self-pay | Admitting: Internal Medicine

## 2020-03-15 ENCOUNTER — Ambulatory Visit (INDEPENDENT_AMBULATORY_CARE_PROVIDER_SITE_OTHER): Payer: Medicare HMO | Admitting: Internal Medicine

## 2020-03-15 VITALS — BP 88/43 | HR 104 | Temp 99.3°F | Ht 68.0 in | Wt 349.1 lb

## 2020-03-15 DIAGNOSIS — E782 Mixed hyperlipidemia: Secondary | ICD-10-CM | POA: Diagnosis not present

## 2020-03-15 DIAGNOSIS — N3946 Mixed incontinence: Secondary | ICD-10-CM | POA: Diagnosis not present

## 2020-03-15 DIAGNOSIS — K219 Gastro-esophageal reflux disease without esophagitis: Secondary | ICD-10-CM

## 2020-03-15 DIAGNOSIS — N183 Chronic kidney disease, stage 3 unspecified: Secondary | ICD-10-CM

## 2020-03-15 DIAGNOSIS — E7841 Elevated Lipoprotein(a): Secondary | ICD-10-CM

## 2020-03-15 DIAGNOSIS — K760 Fatty (change of) liver, not elsewhere classified: Secondary | ICD-10-CM

## 2020-03-15 DIAGNOSIS — N182 Chronic kidney disease, stage 2 (mild): Secondary | ICD-10-CM | POA: Diagnosis not present

## 2020-03-15 DIAGNOSIS — I10 Essential (primary) hypertension: Secondary | ICD-10-CM

## 2020-03-15 DIAGNOSIS — I700 Atherosclerosis of aorta: Secondary | ICD-10-CM

## 2020-03-15 DIAGNOSIS — R053 Chronic cough: Secondary | ICD-10-CM

## 2020-03-15 DIAGNOSIS — F322 Major depressive disorder, single episode, severe without psychotic features: Secondary | ICD-10-CM | POA: Diagnosis not present

## 2020-03-15 DIAGNOSIS — J439 Emphysema, unspecified: Secondary | ICD-10-CM | POA: Diagnosis not present

## 2020-03-15 DIAGNOSIS — E042 Nontoxic multinodular goiter: Secondary | ICD-10-CM | POA: Diagnosis not present

## 2020-03-15 DIAGNOSIS — J432 Centrilobular emphysema: Secondary | ICD-10-CM

## 2020-03-15 DIAGNOSIS — I2729 Other secondary pulmonary hypertension: Secondary | ICD-10-CM

## 2020-03-15 DIAGNOSIS — M170 Bilateral primary osteoarthritis of knee: Secondary | ICD-10-CM | POA: Diagnosis not present

## 2020-03-15 DIAGNOSIS — J9611 Chronic respiratory failure with hypoxia: Secondary | ICD-10-CM | POA: Diagnosis not present

## 2020-03-15 DIAGNOSIS — I129 Hypertensive chronic kidney disease with stage 1 through stage 4 chronic kidney disease, or unspecified chronic kidney disease: Secondary | ICD-10-CM | POA: Diagnosis not present

## 2020-03-15 DIAGNOSIS — G4733 Obstructive sleep apnea (adult) (pediatric): Secondary | ICD-10-CM | POA: Diagnosis not present

## 2020-03-15 DIAGNOSIS — Z0000 Encounter for general adult medical examination without abnormal findings: Secondary | ICD-10-CM

## 2020-03-15 DIAGNOSIS — Z23 Encounter for immunization: Secondary | ICD-10-CM

## 2020-03-15 MED ORDER — VORTIOXETINE HBR 5 MG PO TABS: 5.0000 mg | ORAL_TABLET | Freq: Every evening | ORAL | Status: DC

## 2020-03-15 NOTE — Assessment & Plan Note (Signed)
Pain is not currently functionally limiting or bothering her.

## 2020-03-15 NOTE — Assessment & Plan Note (Signed)
01/05/2020: BUN 24, CR 1.4, CO2 16, EGFR 43 (EGFR 45 1 year prior).  Antihypertension medications at that time were in process of being clarified.  Repeat BMP today.  She has been taking ARB but not HCTZ.  See hypertension entries.

## 2020-03-15 NOTE — Assessment & Plan Note (Signed)
No GERD symptoms.  She has not cleaned her CPAP tubing or filter in a very long time which is advised.  Lungs are clear to auscultation, and no cough occurs during evaluation today.

## 2020-03-15 NOTE — Assessment & Plan Note (Signed)
Home PT OT and the assistance of a weekly home aide for 1 hour have made a big difference in her self-care and the motivation to optimize her physical function.  Her mood has improved, though she does not elaborate today and defers to a follow-up appointment upcoming with her psychiatrist Dr. Evelene Croon.  Note Trintellix has been reduced to 5 mg daily.  She is prescribed alprazolam 1 mg nightly for insomnia per Dr. Evelene Croon.  Doxepin has been discontinued.

## 2020-03-15 NOTE — Progress Notes (Signed)
SATURATION QUALIFICATIONS:   Patient Saturations on Room Air at Rest = 94% Pulse 104  Patient Saturations on Room Air while Ambulating = 95% Pulse 122  Patient walking to the lab 94% pulse 118

## 2020-03-15 NOTE — Assessment & Plan Note (Signed)
Most recent visit at [facility] urology fall 2021.  Scanned under media.  She is pleased with results on long-acting oxybutynin and Myrbetriq.  No anticholinergic side effects with oxybutynin at this time.

## 2020-03-15 NOTE — Assessment & Plan Note (Signed)
CT abdomen 2020 liver parenchyma normal w/o evidence of steatosis at that time.  Will resolve problem, though she is at high risk of this condition.

## 2020-03-15 NOTE — Assessment & Plan Note (Signed)
Having not identified pulmonary HTN on most recent echo 2020, will resolve this diagnosis.

## 2020-03-15 NOTE — Assessment & Plan Note (Signed)
There had been much confusion about her intended regimen when I first met her at the recent visit, as her self prepared medicine list did not agree with the EMR.  She is currently taking amlodipine 10 mg daily, olmesartan 40 mg daily, and though I had discontinued her HCTZ and KCL, she has continued to take her potassium supplementation).  Her BP is overly controlled today.  I have instructed her to stop the potassium immediately and a BMP will be obtained today.  She will also stop her olmesartan.  She is instructed to record home blood pressure readings and I will phone her tomorrow to review these, as well as to advise her on the results of her blood testing.

## 2020-03-15 NOTE — Assessment & Plan Note (Signed)
Pt needs to replace filter and clean tubing to maintain cleanliness of device.

## 2020-03-15 NOTE — Assessment & Plan Note (Signed)
Due for 10-year Tdap, completed today. Has received COVID vaccination x2, booster not yet completed, this is encouraged. Colonoscopy 2006. Flexible sigmoidoscopy 04/22/2018: Indication occult blood loss on screening though on Xarelto.  Diverticulosis without bleeding noted.  Findings otherwise negative. Mammogram 2020 negative.  Overdue for annual follow-up. PCV13 completed 2016, PPSV 23 completed 2014.

## 2020-03-15 NOTE — Assessment & Plan Note (Signed)
01/05/2020: TC 156, HDL 43, LDL 98, TG 77.  On pravastatin 40 mg daily.

## 2020-03-15 NOTE — Assessment & Plan Note (Signed)
Asymptomatic at this time 

## 2020-03-15 NOTE — Patient Instructions (Addendum)
Ms. Breisch, It was good to see you today!  Most important on her agenda today was noting that you had frequent episodes of difficulty catching her breath during our appointment.  This was also happening during a faster than usual heart rate.  When we rechecked your blood pressure, it was quite low.  We compared your blood pressure and heart rate in different positions.  Because your blood pressure is too low, I want you to stop your olmesartan for now.  We are checking your potassium level today as well as a thyroid blood test.  These tests may help sort out why you are experiencing these symptoms.  We reviewed your medicines and you informed me that you are no longer taking doxepin or Pepcid.  This was noted in your medical record.  We also discussed the progress you are making with the home therapists!  Your body seems to be responding well, and many of the sensations you are having are related to improvement in your body's function.  Today you received a pertussis, diphtheria, and tetanus vaccination (1 shot).  You are going to consider more strongly receiving a booster shot for COVID.  I understand you will be seeing your psychiatrist soon.  It is also important that we follow-up your thyroid nodule with an ultrasound.  This order should be active-all you will need to do is get it scheduled.  I hope you will be able to come to the clinic next week for a blood pressure recheck (alternatively, we may be able to have a Human home nurse check it at your home).  Take care and stay well, Dr. Mayford Knife

## 2020-03-16 ENCOUNTER — Encounter: Payer: Self-pay | Admitting: Internal Medicine

## 2020-03-16 LAB — BMP8+ANION GAP
Anion Gap: 17 mmol/L (ref 10.0–18.0)
BUN/Creatinine Ratio: 16 (ref 12–28)
BUN: 27 mg/dL (ref 8–27)
CO2: 19 mmol/L — ABNORMAL LOW (ref 20–29)
Calcium: 9.8 mg/dL (ref 8.7–10.3)
Chloride: 100 mmol/L (ref 96–106)
Creatinine, Ser: 1.73 mg/dL — ABNORMAL HIGH (ref 0.57–1.00)
GFR calc Af Amer: 34 mL/min/{1.73_m2} — ABNORMAL LOW (ref >59)
GFR calc non Af Amer: 29 mL/min/{1.73_m2} — ABNORMAL LOW (ref >59)
Glucose: 102 mg/dL — ABNORMAL HIGH (ref 65–99)
Potassium: 5 mmol/L (ref 3.5–5.2)
Sodium: 136 mmol/L (ref 134–144)

## 2020-03-16 LAB — TSH: TSH: 7.45 u[IU]/mL — ABNORMAL HIGH (ref 0.450–4.500)

## 2020-03-16 NOTE — Progress Notes (Unsigned)
Attempted to contact [name] by phone to determine how she is feeling and what her ambulatory blood pressure readings are.  I was also planning to discuss her test results obtained in clinic visit yesterday.  My office number was left for a return call if she is able to do this by the end of the business day.

## 2020-03-18 DIAGNOSIS — R0602 Shortness of breath: Secondary | ICD-10-CM | POA: Diagnosis not present

## 2020-03-18 DIAGNOSIS — Z86718 Personal history of other venous thrombosis and embolism: Secondary | ICD-10-CM | POA: Diagnosis not present

## 2020-03-18 DIAGNOSIS — R0603 Acute respiratory distress: Secondary | ICD-10-CM | POA: Diagnosis not present

## 2020-03-18 DIAGNOSIS — I272 Pulmonary hypertension, unspecified: Secondary | ICD-10-CM | POA: Diagnosis not present

## 2020-03-18 DIAGNOSIS — R0682 Tachypnea, not elsewhere classified: Secondary | ICD-10-CM | POA: Diagnosis not present

## 2020-03-18 DIAGNOSIS — R0902 Hypoxemia: Secondary | ICD-10-CM | POA: Diagnosis not present

## 2020-03-18 DIAGNOSIS — G4733 Obstructive sleep apnea (adult) (pediatric): Secondary | ICD-10-CM | POA: Diagnosis not present

## 2020-03-18 DIAGNOSIS — J9611 Chronic respiratory failure with hypoxia: Secondary | ICD-10-CM | POA: Diagnosis not present

## 2020-03-19 DIAGNOSIS — J9611 Chronic respiratory failure with hypoxia: Secondary | ICD-10-CM | POA: Diagnosis not present

## 2020-03-19 DIAGNOSIS — G4733 Obstructive sleep apnea (adult) (pediatric): Secondary | ICD-10-CM | POA: Diagnosis not present

## 2020-03-19 DIAGNOSIS — F322 Major depressive disorder, single episode, severe without psychotic features: Secondary | ICD-10-CM | POA: Diagnosis not present

## 2020-03-19 DIAGNOSIS — F331 Major depressive disorder, recurrent, moderate: Secondary | ICD-10-CM | POA: Diagnosis not present

## 2020-03-19 DIAGNOSIS — I129 Hypertensive chronic kidney disease with stage 1 through stage 4 chronic kidney disease, or unspecified chronic kidney disease: Secondary | ICD-10-CM | POA: Diagnosis not present

## 2020-03-19 DIAGNOSIS — E782 Mixed hyperlipidemia: Secondary | ICD-10-CM | POA: Diagnosis not present

## 2020-03-19 DIAGNOSIS — J439 Emphysema, unspecified: Secondary | ICD-10-CM | POA: Diagnosis not present

## 2020-03-19 DIAGNOSIS — M170 Bilateral primary osteoarthritis of knee: Secondary | ICD-10-CM | POA: Diagnosis not present

## 2020-03-19 DIAGNOSIS — N182 Chronic kidney disease, stage 2 (mild): Secondary | ICD-10-CM | POA: Diagnosis not present

## 2020-03-20 DIAGNOSIS — G4733 Obstructive sleep apnea (adult) (pediatric): Secondary | ICD-10-CM | POA: Diagnosis not present

## 2020-03-21 DIAGNOSIS — J9611 Chronic respiratory failure with hypoxia: Secondary | ICD-10-CM | POA: Diagnosis not present

## 2020-03-21 DIAGNOSIS — F322 Major depressive disorder, single episode, severe without psychotic features: Secondary | ICD-10-CM | POA: Diagnosis not present

## 2020-03-21 DIAGNOSIS — E782 Mixed hyperlipidemia: Secondary | ICD-10-CM | POA: Diagnosis not present

## 2020-03-21 DIAGNOSIS — I129 Hypertensive chronic kidney disease with stage 1 through stage 4 chronic kidney disease, or unspecified chronic kidney disease: Secondary | ICD-10-CM | POA: Diagnosis not present

## 2020-03-21 DIAGNOSIS — G4733 Obstructive sleep apnea (adult) (pediatric): Secondary | ICD-10-CM | POA: Diagnosis not present

## 2020-03-21 DIAGNOSIS — N182 Chronic kidney disease, stage 2 (mild): Secondary | ICD-10-CM | POA: Diagnosis not present

## 2020-03-21 DIAGNOSIS — M170 Bilateral primary osteoarthritis of knee: Secondary | ICD-10-CM | POA: Diagnosis not present

## 2020-03-21 DIAGNOSIS — J439 Emphysema, unspecified: Secondary | ICD-10-CM | POA: Diagnosis not present

## 2020-03-23 DIAGNOSIS — E782 Mixed hyperlipidemia: Secondary | ICD-10-CM | POA: Diagnosis not present

## 2020-03-23 DIAGNOSIS — G4733 Obstructive sleep apnea (adult) (pediatric): Secondary | ICD-10-CM | POA: Diagnosis not present

## 2020-03-23 DIAGNOSIS — I129 Hypertensive chronic kidney disease with stage 1 through stage 4 chronic kidney disease, or unspecified chronic kidney disease: Secondary | ICD-10-CM | POA: Diagnosis not present

## 2020-03-23 DIAGNOSIS — J439 Emphysema, unspecified: Secondary | ICD-10-CM | POA: Diagnosis not present

## 2020-03-23 DIAGNOSIS — N182 Chronic kidney disease, stage 2 (mild): Secondary | ICD-10-CM | POA: Diagnosis not present

## 2020-03-23 DIAGNOSIS — J9611 Chronic respiratory failure with hypoxia: Secondary | ICD-10-CM | POA: Diagnosis not present

## 2020-03-23 DIAGNOSIS — F322 Major depressive disorder, single episode, severe without psychotic features: Secondary | ICD-10-CM | POA: Diagnosis not present

## 2020-03-23 DIAGNOSIS — M170 Bilateral primary osteoarthritis of knee: Secondary | ICD-10-CM | POA: Diagnosis not present

## 2020-03-26 DIAGNOSIS — F331 Major depressive disorder, recurrent, moderate: Secondary | ICD-10-CM | POA: Diagnosis not present

## 2020-03-27 DIAGNOSIS — J9611 Chronic respiratory failure with hypoxia: Secondary | ICD-10-CM | POA: Diagnosis not present

## 2020-03-27 DIAGNOSIS — I129 Hypertensive chronic kidney disease with stage 1 through stage 4 chronic kidney disease, or unspecified chronic kidney disease: Secondary | ICD-10-CM | POA: Diagnosis not present

## 2020-03-27 DIAGNOSIS — F322 Major depressive disorder, single episode, severe without psychotic features: Secondary | ICD-10-CM | POA: Diagnosis not present

## 2020-03-27 DIAGNOSIS — E782 Mixed hyperlipidemia: Secondary | ICD-10-CM | POA: Diagnosis not present

## 2020-03-27 DIAGNOSIS — G4733 Obstructive sleep apnea (adult) (pediatric): Secondary | ICD-10-CM | POA: Diagnosis not present

## 2020-03-27 DIAGNOSIS — N182 Chronic kidney disease, stage 2 (mild): Secondary | ICD-10-CM | POA: Diagnosis not present

## 2020-03-27 DIAGNOSIS — M170 Bilateral primary osteoarthritis of knee: Secondary | ICD-10-CM | POA: Diagnosis not present

## 2020-03-27 DIAGNOSIS — J439 Emphysema, unspecified: Secondary | ICD-10-CM | POA: Diagnosis not present

## 2020-03-28 DIAGNOSIS — I129 Hypertensive chronic kidney disease with stage 1 through stage 4 chronic kidney disease, or unspecified chronic kidney disease: Secondary | ICD-10-CM | POA: Diagnosis not present

## 2020-03-28 DIAGNOSIS — E782 Mixed hyperlipidemia: Secondary | ICD-10-CM | POA: Diagnosis not present

## 2020-03-28 DIAGNOSIS — J9611 Chronic respiratory failure with hypoxia: Secondary | ICD-10-CM | POA: Diagnosis not present

## 2020-03-28 DIAGNOSIS — G4733 Obstructive sleep apnea (adult) (pediatric): Secondary | ICD-10-CM | POA: Diagnosis not present

## 2020-03-28 DIAGNOSIS — M170 Bilateral primary osteoarthritis of knee: Secondary | ICD-10-CM | POA: Diagnosis not present

## 2020-03-28 DIAGNOSIS — N182 Chronic kidney disease, stage 2 (mild): Secondary | ICD-10-CM | POA: Diagnosis not present

## 2020-03-28 DIAGNOSIS — F322 Major depressive disorder, single episode, severe without psychotic features: Secondary | ICD-10-CM | POA: Diagnosis not present

## 2020-03-28 DIAGNOSIS — J439 Emphysema, unspecified: Secondary | ICD-10-CM | POA: Diagnosis not present

## 2020-04-02 ENCOUNTER — Ambulatory Visit (INDEPENDENT_AMBULATORY_CARE_PROVIDER_SITE_OTHER): Payer: Medicare HMO | Admitting: Dietician

## 2020-04-02 ENCOUNTER — Encounter: Payer: Self-pay | Admitting: Dietician

## 2020-04-02 DIAGNOSIS — F331 Major depressive disorder, recurrent, moderate: Secondary | ICD-10-CM | POA: Diagnosis not present

## 2020-04-02 DIAGNOSIS — Z6841 Body Mass Index (BMI) 40.0 and over, adult: Secondary | ICD-10-CM

## 2020-04-02 DIAGNOSIS — Z713 Dietary counseling and surveillance: Secondary | ICD-10-CM

## 2020-04-02 DIAGNOSIS — N183 Chronic kidney disease, stage 3 unspecified: Secondary | ICD-10-CM | POA: Diagnosis not present

## 2020-04-02 NOTE — Progress Notes (Unsigned)
Medical Nutrition Therapy:   Appt start time: 1317 end time:  1415 Total time:58 minutes Visit # 12  Goals- weight loss and maintenance with improved physical and mental health. "Self care" Lowest weight has been 340# Expected outcomes/Reasons for goals- improve and maintain her independence  Assessment: [name] presents today for nutritional counseling follow up for weight loss and Chronic Kidney disease. She continues to use myfitnesspal regularly to record her food intake and physical activity with RDN feedback weekly. She continues with peer group support meetings weekly and meets with her psychiatrist. She reports many benefits of having home PT/OT/aide. She is now bathing, walking, thinking about doing things to clean up and organize her life.  PT/OT measure BP and had been normal. Her incontinence is the same without any medications for several weeks and she is able to get stand up unassisted and brathe better, she rate herself a 5 in her goal of eating balanced meals over that past 6 weeks We discussed her struggles with changing some habits like getting dressed, allowing herself "bad foods" meaning ones that she cannot control her portion size over a limited time, staying in bed. She states she is moving more and is stronger.  Her weight is decreased 8# in 6 weeks; she has lost a total of 39# thus far.   ANTHROPOMETRICS: Estimated body mass index is 52.73 kg/m as calculated from the following:   Height as of 03/15/20: 5\' 8"  (1.727 m).   Weight as of this encounter: 346 lb 12.8 oz (157.3 kg).   Wt Readings from Last 10 Encounters:  04/02/20 (!) 346 lb 12.8 oz (157.3 kg)  03/15/20 (!) 349 lb 1.6 oz (158.4 kg)  02/02/20 (!) 354 lb 9.6 oz (160.8 kg)  01/05/20 (!) 355 lb 1.6 oz (161.1 kg)  12/22/19 (!) 351 lb 4.8 oz (159.3 kg)  11/10/19 (!) 353 lb 8 oz (160.3 kg)  09/29/19 (!) 352 lb 14.4 oz (160.1 kg)  08/09/19 (!) 359 lb 6.4 oz (163 kg)  07/14/19 (!) 360 lb 12.8 oz (163.7 kg)   06/23/19 (!) 362 lb 8 oz (164.4 kg)  Was 385.6# at the start and has achieved a 38.8# weight loss total and seems to be maintaining that loss. Her stated goa as of today is 330#. When she hits that goal she may think about getting into the 200s.   SLEEP: discussed cleaning CPAP, she states she has all the supplies to clean it LABS: BMP Latest Ref Rng & Units 03/15/2020 01/05/2020 02/04/2019  Glucose 65 - 99 mg/dL 14/06/2018) 824(M) 353(I)  BUN 8 - 27 mg/dL 27 24 20   Creatinine 0.57 - 1.00 mg/dL 144(R) ) 1.54(M)  BUN/Creat Ratio 12 - 28 16 17 15   Sodium 134 - 144 mmol/L 136 138 138  Potassium 3.5 - 5.2 mmol/L 5.0 4.7 4.3  Chloride 96 - 106 mmol/L 100 102 101  CO2 20 - 29 mmol/L 19(L) 16(L) 21  Calcium 8.7 - 10.3 mg/dL 9.8 9.6 9.5    DIETARY INTAKE: Recording consistently using My fitness PAL; her calories, protein, fiber, fat have remained close to her target  With a few episodes of very high calories intake from binges  Usual physical activity: improving fitness noted by her walking 20-30 minutes a few days a week. PT and OT are encouraging her to create habits of their exercises and activity they are giving her.  Her goal is eventually at least 22 minutes daily walking in her hallways or parking lot  Progress  Towards Goal(s):  Some progress.   Nutritional Diagnosis:  NB-1.5 Disordered eating pattern As related to eating one meal a day and binging at other times has reoccured As evidenced by her report and food records.    Intervention:  Nutrition counseling using motivational interviewing to assist her with evaluating her progress and goals.  Action Goal: continue to log her food and increase activity as she can  Outcome goal: Gain confidence and continue progress towards consistent meal and activity tracking, long term weight loss maintenance to reach/maintian her desired quality of life  Coordination of care: contacted her primary care doctor about blood pressure cuffs and follow  up Teaching Method Utilized: visual and  Auditory Handouts given during visit include:  After visit summary Barriers to learning/adherence to lifestyle change: depression and life stressors Demonstrated degree of understanding via:  Verbalization   Monitoring/Evaluation:  Dietary intake and body weight 4 weeks [name], RD 04/02/2020 3:33 PM.

## 2020-04-02 NOTE — Patient Instructions (Addendum)
I suggest you take some vitamin D- at least until until you get outside in the sun every day.  You can get this in a vitamin D3.   I will ask about blood pressure cuffs.   Congrats on continuing to log foods and activity.   Glad to hear you are doing better.  Per Dr. Mayford Knife- she wants to see you around 04/12/20.   Lupita Leash [phone]

## 2020-04-03 ENCOUNTER — Other Ambulatory Visit: Payer: Self-pay | Admitting: Dietician

## 2020-04-03 DIAGNOSIS — N183 Chronic kidney disease, stage 3 unspecified: Secondary | ICD-10-CM

## 2020-04-03 DIAGNOSIS — G4733 Obstructive sleep apnea (adult) (pediatric): Secondary | ICD-10-CM | POA: Diagnosis not present

## 2020-04-03 DIAGNOSIS — I129 Hypertensive chronic kidney disease with stage 1 through stage 4 chronic kidney disease, or unspecified chronic kidney disease: Secondary | ICD-10-CM | POA: Diagnosis not present

## 2020-04-03 DIAGNOSIS — F322 Major depressive disorder, single episode, severe without psychotic features: Secondary | ICD-10-CM | POA: Diagnosis not present

## 2020-04-03 DIAGNOSIS — J439 Emphysema, unspecified: Secondary | ICD-10-CM | POA: Diagnosis not present

## 2020-04-03 DIAGNOSIS — J9611 Chronic respiratory failure with hypoxia: Secondary | ICD-10-CM | POA: Diagnosis not present

## 2020-04-03 DIAGNOSIS — M170 Bilateral primary osteoarthritis of knee: Secondary | ICD-10-CM | POA: Diagnosis not present

## 2020-04-03 DIAGNOSIS — E782 Mixed hyperlipidemia: Secondary | ICD-10-CM | POA: Diagnosis not present

## 2020-04-03 DIAGNOSIS — N182 Chronic kidney disease, stage 2 (mild): Secondary | ICD-10-CM | POA: Diagnosis not present

## 2020-04-03 NOTE — Progress Notes (Signed)
Referral in this note - please sign

## 2020-04-04 DIAGNOSIS — I129 Hypertensive chronic kidney disease with stage 1 through stage 4 chronic kidney disease, or unspecified chronic kidney disease: Secondary | ICD-10-CM | POA: Diagnosis not present

## 2020-04-04 DIAGNOSIS — E782 Mixed hyperlipidemia: Secondary | ICD-10-CM | POA: Diagnosis not present

## 2020-04-04 DIAGNOSIS — F322 Major depressive disorder, single episode, severe without psychotic features: Secondary | ICD-10-CM | POA: Diagnosis not present

## 2020-04-04 DIAGNOSIS — J439 Emphysema, unspecified: Secondary | ICD-10-CM | POA: Diagnosis not present

## 2020-04-04 DIAGNOSIS — G4733 Obstructive sleep apnea (adult) (pediatric): Secondary | ICD-10-CM | POA: Diagnosis not present

## 2020-04-04 DIAGNOSIS — N182 Chronic kidney disease, stage 2 (mild): Secondary | ICD-10-CM | POA: Diagnosis not present

## 2020-04-04 DIAGNOSIS — M170 Bilateral primary osteoarthritis of knee: Secondary | ICD-10-CM | POA: Diagnosis not present

## 2020-04-04 DIAGNOSIS — J9611 Chronic respiratory failure with hypoxia: Secondary | ICD-10-CM | POA: Diagnosis not present

## 2020-04-06 DIAGNOSIS — M170 Bilateral primary osteoarthritis of knee: Secondary | ICD-10-CM | POA: Diagnosis not present

## 2020-04-06 DIAGNOSIS — G4733 Obstructive sleep apnea (adult) (pediatric): Secondary | ICD-10-CM | POA: Diagnosis not present

## 2020-04-06 DIAGNOSIS — F322 Major depressive disorder, single episode, severe without psychotic features: Secondary | ICD-10-CM | POA: Diagnosis not present

## 2020-04-06 DIAGNOSIS — I129 Hypertensive chronic kidney disease with stage 1 through stage 4 chronic kidney disease, or unspecified chronic kidney disease: Secondary | ICD-10-CM | POA: Diagnosis not present

## 2020-04-06 DIAGNOSIS — E782 Mixed hyperlipidemia: Secondary | ICD-10-CM | POA: Diagnosis not present

## 2020-04-06 DIAGNOSIS — J9611 Chronic respiratory failure with hypoxia: Secondary | ICD-10-CM | POA: Diagnosis not present

## 2020-04-06 DIAGNOSIS — N182 Chronic kidney disease, stage 2 (mild): Secondary | ICD-10-CM | POA: Diagnosis not present

## 2020-04-06 DIAGNOSIS — J439 Emphysema, unspecified: Secondary | ICD-10-CM | POA: Diagnosis not present

## 2020-04-09 DIAGNOSIS — F331 Major depressive disorder, recurrent, moderate: Secondary | ICD-10-CM | POA: Diagnosis not present

## 2020-04-11 DIAGNOSIS — E782 Mixed hyperlipidemia: Secondary | ICD-10-CM | POA: Diagnosis not present

## 2020-04-11 DIAGNOSIS — I129 Hypertensive chronic kidney disease with stage 1 through stage 4 chronic kidney disease, or unspecified chronic kidney disease: Secondary | ICD-10-CM | POA: Diagnosis not present

## 2020-04-11 DIAGNOSIS — M170 Bilateral primary osteoarthritis of knee: Secondary | ICD-10-CM | POA: Diagnosis not present

## 2020-04-11 DIAGNOSIS — G4733 Obstructive sleep apnea (adult) (pediatric): Secondary | ICD-10-CM | POA: Diagnosis not present

## 2020-04-11 DIAGNOSIS — J439 Emphysema, unspecified: Secondary | ICD-10-CM | POA: Diagnosis not present

## 2020-04-11 DIAGNOSIS — J9611 Chronic respiratory failure with hypoxia: Secondary | ICD-10-CM | POA: Diagnosis not present

## 2020-04-11 DIAGNOSIS — N182 Chronic kidney disease, stage 2 (mild): Secondary | ICD-10-CM | POA: Diagnosis not present

## 2020-04-11 DIAGNOSIS — F322 Major depressive disorder, single episode, severe without psychotic features: Secondary | ICD-10-CM | POA: Diagnosis not present

## 2020-04-13 DIAGNOSIS — J439 Emphysema, unspecified: Secondary | ICD-10-CM | POA: Diagnosis not present

## 2020-04-13 DIAGNOSIS — N182 Chronic kidney disease, stage 2 (mild): Secondary | ICD-10-CM | POA: Diagnosis not present

## 2020-04-13 DIAGNOSIS — M170 Bilateral primary osteoarthritis of knee: Secondary | ICD-10-CM | POA: Diagnosis not present

## 2020-04-13 DIAGNOSIS — E782 Mixed hyperlipidemia: Secondary | ICD-10-CM | POA: Diagnosis not present

## 2020-04-13 DIAGNOSIS — G4733 Obstructive sleep apnea (adult) (pediatric): Secondary | ICD-10-CM | POA: Diagnosis not present

## 2020-04-13 DIAGNOSIS — I129 Hypertensive chronic kidney disease with stage 1 through stage 4 chronic kidney disease, or unspecified chronic kidney disease: Secondary | ICD-10-CM | POA: Diagnosis not present

## 2020-04-13 DIAGNOSIS — J9611 Chronic respiratory failure with hypoxia: Secondary | ICD-10-CM | POA: Diagnosis not present

## 2020-04-13 DIAGNOSIS — F322 Major depressive disorder, single episode, severe without psychotic features: Secondary | ICD-10-CM | POA: Diagnosis not present

## 2020-04-16 DIAGNOSIS — F331 Major depressive disorder, recurrent, moderate: Secondary | ICD-10-CM | POA: Diagnosis not present

## 2020-04-17 DIAGNOSIS — F322 Major depressive disorder, single episode, severe without psychotic features: Secondary | ICD-10-CM | POA: Diagnosis not present

## 2020-04-17 DIAGNOSIS — N182 Chronic kidney disease, stage 2 (mild): Secondary | ICD-10-CM | POA: Diagnosis not present

## 2020-04-17 DIAGNOSIS — J439 Emphysema, unspecified: Secondary | ICD-10-CM | POA: Diagnosis not present

## 2020-04-17 DIAGNOSIS — G4733 Obstructive sleep apnea (adult) (pediatric): Secondary | ICD-10-CM | POA: Diagnosis not present

## 2020-04-17 DIAGNOSIS — M170 Bilateral primary osteoarthritis of knee: Secondary | ICD-10-CM | POA: Diagnosis not present

## 2020-04-17 DIAGNOSIS — E782 Mixed hyperlipidemia: Secondary | ICD-10-CM | POA: Diagnosis not present

## 2020-04-17 DIAGNOSIS — J9611 Chronic respiratory failure with hypoxia: Secondary | ICD-10-CM | POA: Diagnosis not present

## 2020-04-17 DIAGNOSIS — I129 Hypertensive chronic kidney disease with stage 1 through stage 4 chronic kidney disease, or unspecified chronic kidney disease: Secondary | ICD-10-CM | POA: Diagnosis not present

## 2020-04-18 DIAGNOSIS — R0602 Shortness of breath: Secondary | ICD-10-CM | POA: Diagnosis not present

## 2020-04-18 DIAGNOSIS — I272 Pulmonary hypertension, unspecified: Secondary | ICD-10-CM | POA: Diagnosis not present

## 2020-04-18 DIAGNOSIS — M170 Bilateral primary osteoarthritis of knee: Secondary | ICD-10-CM | POA: Diagnosis not present

## 2020-04-18 DIAGNOSIS — J439 Emphysema, unspecified: Secondary | ICD-10-CM | POA: Diagnosis not present

## 2020-04-18 DIAGNOSIS — E782 Mixed hyperlipidemia: Secondary | ICD-10-CM | POA: Diagnosis not present

## 2020-04-18 DIAGNOSIS — F322 Major depressive disorder, single episode, severe without psychotic features: Secondary | ICD-10-CM | POA: Diagnosis not present

## 2020-04-18 DIAGNOSIS — N182 Chronic kidney disease, stage 2 (mild): Secondary | ICD-10-CM | POA: Diagnosis not present

## 2020-04-18 DIAGNOSIS — G4733 Obstructive sleep apnea (adult) (pediatric): Secondary | ICD-10-CM | POA: Diagnosis not present

## 2020-04-18 DIAGNOSIS — R0902 Hypoxemia: Secondary | ICD-10-CM | POA: Diagnosis not present

## 2020-04-18 DIAGNOSIS — I129 Hypertensive chronic kidney disease with stage 1 through stage 4 chronic kidney disease, or unspecified chronic kidney disease: Secondary | ICD-10-CM | POA: Diagnosis not present

## 2020-04-18 DIAGNOSIS — J9611 Chronic respiratory failure with hypoxia: Secondary | ICD-10-CM | POA: Diagnosis not present

## 2020-04-18 DIAGNOSIS — K5730 Diverticulosis of large intestine without perforation or abscess without bleeding: Secondary | ICD-10-CM | POA: Diagnosis not present

## 2020-04-18 DIAGNOSIS — Z86718 Personal history of other venous thrombosis and embolism: Secondary | ICD-10-CM | POA: Diagnosis not present

## 2020-04-18 DIAGNOSIS — R0603 Acute respiratory distress: Secondary | ICD-10-CM | POA: Diagnosis not present

## 2020-04-18 DIAGNOSIS — R0682 Tachypnea, not elsewhere classified: Secondary | ICD-10-CM | POA: Diagnosis not present

## 2020-04-19 ENCOUNTER — Encounter: Payer: Self-pay | Admitting: Internal Medicine

## 2020-04-19 ENCOUNTER — Ambulatory Visit (INDEPENDENT_AMBULATORY_CARE_PROVIDER_SITE_OTHER): Payer: Medicare HMO | Admitting: Internal Medicine

## 2020-04-19 VITALS — BP 127/62 | HR 88 | Temp 98.4°F | Ht 68.0 in | Wt 340.5 lb

## 2020-04-19 DIAGNOSIS — N183 Chronic kidney disease, stage 3 unspecified: Secondary | ICD-10-CM

## 2020-04-19 DIAGNOSIS — G4733 Obstructive sleep apnea (adult) (pediatric): Secondary | ICD-10-CM | POA: Diagnosis not present

## 2020-04-19 DIAGNOSIS — I129 Hypertensive chronic kidney disease with stage 1 through stage 4 chronic kidney disease, or unspecified chronic kidney disease: Secondary | ICD-10-CM

## 2020-04-19 DIAGNOSIS — Z6841 Body Mass Index (BMI) 40.0 and over, adult: Secondary | ICD-10-CM

## 2020-04-19 DIAGNOSIS — F322 Major depressive disorder, single episode, severe without psychotic features: Secondary | ICD-10-CM

## 2020-04-19 DIAGNOSIS — I10 Essential (primary) hypertension: Secondary | ICD-10-CM | POA: Diagnosis not present

## 2020-04-19 DIAGNOSIS — M1711 Unilateral primary osteoarthritis, right knee: Secondary | ICD-10-CM

## 2020-04-19 DIAGNOSIS — N3946 Mixed incontinence: Secondary | ICD-10-CM

## 2020-04-19 NOTE — Assessment & Plan Note (Signed)
Pt has self discontinued her Myrbetriq, TMP, and Oxybutynin and is doing well without.  She continues to have urge but no incontinence.  Uses BSC and doesn't have to walk long distance for bathroom.

## 2020-04-19 NOTE — Assessment & Plan Note (Signed)
Continues to wear CPAP.

## 2020-04-19 NOTE — Assessment & Plan Note (Signed)
Has lost 15# in 3 months intentionally through work with dietician.  Excellent progress! She is pleased and motivated to continue as she is beginning to feel the results.

## 2020-04-19 NOTE — Assessment & Plan Note (Signed)
ARB was stopped at last visit at which time CKD had worsened; recheck BMP today.

## 2020-04-19 NOTE — Patient Instructions (Signed)
Great to see you today.  Today we discussed your success with slow and steady weight loss! YOu are also making progress with your therapists.  Please keep up your efforts and remember that we are here to cheer you on!  Your blood pressure looks great on only the amlodipine, so at this time we won't recommend checking blood pressures at home since you're so well controlled.  We will not make any changes in your medicines.  For your knee, try taking acetaminophen (Tylenol) 1000 mg up to three times a day.  You can use the [facility], or any other similar preparation at the drug store.  Try placing a cushion between your knees at night to see if that helps your comfort and reduces the friction on your skin.  Let's get together in a couple of months, or earlier if needed.  You can try to coordinate with Donna's appts if you like.  I'll call you with results of your thyroid blood tests.    Take care and stay well!  Dr. Mayford Knife

## 2020-04-19 NOTE — Progress Notes (Signed)
CC "my R knee has been hurting" HPI:  Ms. Favela returns for routine f/u.  She is pleased to announce that she has lost 6 pounds intentionally and can feel the positive results already.  She continues with Hom PT; RN ant OT have completed their interventions.  She is capable of moving around better, but unfortunately admits to lacking the motivation to maintain movement - continues to prefer spending her waking hours in the bed; "it's so comfortable, I have everything set up there, so I can use my computer and eat and anything I need to do".  She feels that her legs need additional work, and that much additional progress will be up to the effort she commits. "Everyone is trying to help me, I just have to get up.  But I like my bed.  I'm not sad, I just don't see a good reason to get up".  She recalls her pet dog who relied on her for food, water, and going outside "that was my reason to move, but now that reason is gone. "  She has thought about checking out the [facility], and [facility], but hasn't had the motivation to pursue.  PACE was too expensive (she did inquire).  When asked what it would take for her to get up, she stated, "I need a goal, I need a purpose.  There's no purpose, I just get up and do the same thing every day." No motivation to even get out of bed for meals. "I have a table, I would rather just eat in my bed, it's more comfortable."  Continues to be active with her mental health support group and her counselor.  They are also encouraging her to get out of bed.  She has a 04/25/20 appt with psychiatrist Dr Evelene Croon; taking her antidepressant regimen faithfully.  R knee has recently been aching as she moves more with therapy.  She feels that it may be swollen.  Was once informed that her joints were "bone on bone'.  Icy hot helps.  . She has decided to stop her UI medications (Myretriq, oxybutynin, TMP) and has not noted much worsening; she has urge but can usually get to  the toilet to avoid incontinence.  Has a BSC.    BPs are doing great! At target SBP < 130 nearly always. No problems with her medications. Check dose of amlodipine - is it 5 (as in our records) or 10, as on her list? She didn't bring medications today.  VS 127/62, 88, Wt 340.5#, BMI 51.77, 98% RA Nicely dressed and groomed, affect is bright, she actively engages in conversation and mood is positive.  R knee compared to L has mild swelling primarily medial superior; joint lines are mild tender. No increased warmth.  Legs and knees have significant adiposity.  She is able to walk the long distance to/from the clinic from parking area with use of her rollator.  Gait doesn't appear antalgic.   A/P:  Ms. Narvaiz has made some progress with her therapists, and is very grateful for the home aide who visits weekly to assist with bathing (she remains too unsteady to do this herself).  Her obstacle to further improvement in mobility is mental - she acknowledges this.  When asked what it would take to get her motivated, she replied "a purpose".    See problem list for additional documentation. F/u 8 wks, sooner if needed.

## 2020-04-19 NOTE — Assessment & Plan Note (Signed)
BP under excellent control on amlodipine only, though the dose needs clarification. Home BP cuff will not be needed at this time given effective regimen.

## 2020-04-19 NOTE — Assessment & Plan Note (Signed)
R knee more painful lately, mild effusion, no warmth.  She has been more physically active with PT lately.  Icy Hot has some benefit.  Advised acetaminophen 1000 mg up to tid.  She does not wish to pursue TKR.

## 2020-04-19 NOTE — Assessment & Plan Note (Signed)
No recent changes in her regimen which was reviewed.  Appt with Dr. Evelene Croon next week.  She continues to talk frequently with her peer support group and her counselor.  She acknowledges that her biggest challenge is her apathy which causes her to spend her waking hours in her bed.  She wishes she had a goal or a purpose.

## 2020-04-20 DIAGNOSIS — G4733 Obstructive sleep apnea (adult) (pediatric): Secondary | ICD-10-CM | POA: Diagnosis not present

## 2020-04-20 LAB — BMP8+ANION GAP
Anion Gap: 22 mmol/L — ABNORMAL HIGH (ref 10.0–18.0)
BUN/Creatinine Ratio: 14 (ref 12–28)
BUN: 17 mg/dL (ref 8–27)
CO2: 19 mmol/L — ABNORMAL LOW (ref 20–29)
Calcium: 9.5 mg/dL (ref 8.7–10.3)
Chloride: 96 mmol/L (ref 96–106)
Creatinine, Ser: 1.2 mg/dL — ABNORMAL HIGH (ref 0.57–1.00)
GFR calc Af Amer: 52 mL/min/{1.73_m2} — ABNORMAL LOW (ref >59)
GFR calc non Af Amer: 45 mL/min/{1.73_m2} — ABNORMAL LOW (ref >59)
Glucose: 122 mg/dL — ABNORMAL HIGH (ref 65–99)
Potassium: 3.5 mmol/L (ref 3.5–5.2)
Sodium: 137 mmol/L (ref 134–144)

## 2020-04-20 LAB — T4, FREE: Free T4: 1.22 ng/dL (ref 0.82–1.77)

## 2020-04-20 LAB — TSH: TSH: 2.59 u[IU]/mL (ref 0.450–4.500)

## 2020-04-23 DIAGNOSIS — F331 Major depressive disorder, recurrent, moderate: Secondary | ICD-10-CM | POA: Diagnosis not present

## 2020-04-27 DIAGNOSIS — J439 Emphysema, unspecified: Secondary | ICD-10-CM | POA: Diagnosis not present

## 2020-04-27 DIAGNOSIS — G4733 Obstructive sleep apnea (adult) (pediatric): Secondary | ICD-10-CM | POA: Diagnosis not present

## 2020-04-27 DIAGNOSIS — J9611 Chronic respiratory failure with hypoxia: Secondary | ICD-10-CM | POA: Diagnosis not present

## 2020-04-27 DIAGNOSIS — E782 Mixed hyperlipidemia: Secondary | ICD-10-CM | POA: Diagnosis not present

## 2020-04-27 DIAGNOSIS — I129 Hypertensive chronic kidney disease with stage 1 through stage 4 chronic kidney disease, or unspecified chronic kidney disease: Secondary | ICD-10-CM | POA: Diagnosis not present

## 2020-04-27 DIAGNOSIS — K5730 Diverticulosis of large intestine without perforation or abscess without bleeding: Secondary | ICD-10-CM | POA: Diagnosis not present

## 2020-04-27 DIAGNOSIS — N182 Chronic kidney disease, stage 2 (mild): Secondary | ICD-10-CM | POA: Diagnosis not present

## 2020-04-27 DIAGNOSIS — M170 Bilateral primary osteoarthritis of knee: Secondary | ICD-10-CM | POA: Diagnosis not present

## 2020-04-27 DIAGNOSIS — F322 Major depressive disorder, single episode, severe without psychotic features: Secondary | ICD-10-CM | POA: Diagnosis not present

## 2020-04-30 DIAGNOSIS — J439 Emphysema, unspecified: Secondary | ICD-10-CM | POA: Diagnosis not present

## 2020-04-30 DIAGNOSIS — G4733 Obstructive sleep apnea (adult) (pediatric): Secondary | ICD-10-CM | POA: Diagnosis not present

## 2020-04-30 DIAGNOSIS — J9611 Chronic respiratory failure with hypoxia: Secondary | ICD-10-CM | POA: Diagnosis not present

## 2020-04-30 DIAGNOSIS — K5730 Diverticulosis of large intestine without perforation or abscess without bleeding: Secondary | ICD-10-CM | POA: Diagnosis not present

## 2020-04-30 DIAGNOSIS — M170 Bilateral primary osteoarthritis of knee: Secondary | ICD-10-CM | POA: Diagnosis not present

## 2020-04-30 DIAGNOSIS — N182 Chronic kidney disease, stage 2 (mild): Secondary | ICD-10-CM | POA: Diagnosis not present

## 2020-04-30 DIAGNOSIS — F322 Major depressive disorder, single episode, severe without psychotic features: Secondary | ICD-10-CM | POA: Diagnosis not present

## 2020-04-30 DIAGNOSIS — F331 Major depressive disorder, recurrent, moderate: Secondary | ICD-10-CM | POA: Diagnosis not present

## 2020-04-30 DIAGNOSIS — E782 Mixed hyperlipidemia: Secondary | ICD-10-CM | POA: Diagnosis not present

## 2020-04-30 DIAGNOSIS — I129 Hypertensive chronic kidney disease with stage 1 through stage 4 chronic kidney disease, or unspecified chronic kidney disease: Secondary | ICD-10-CM | POA: Diagnosis not present

## 2020-05-01 ENCOUNTER — Other Ambulatory Visit: Payer: Self-pay | Admitting: Internal Medicine

## 2020-05-01 DIAGNOSIS — J432 Centrilobular emphysema: Secondary | ICD-10-CM

## 2020-05-03 ENCOUNTER — Encounter: Payer: Self-pay | Admitting: Dietician

## 2020-05-03 ENCOUNTER — Encounter: Payer: Medicare HMO | Admitting: Dietician

## 2020-05-03 ENCOUNTER — Ambulatory Visit (INDEPENDENT_AMBULATORY_CARE_PROVIDER_SITE_OTHER): Payer: Medicare HMO | Admitting: Dietician

## 2020-05-03 ENCOUNTER — Other Ambulatory Visit: Payer: Self-pay

## 2020-05-03 DIAGNOSIS — N183 Chronic kidney disease, stage 3 unspecified: Secondary | ICD-10-CM

## 2020-05-03 DIAGNOSIS — Z6841 Body Mass Index (BMI) 40.0 and over, adult: Secondary | ICD-10-CM

## 2020-05-03 DIAGNOSIS — Z713 Dietary counseling and surveillance: Secondary | ICD-10-CM | POA: Diagnosis not present

## 2020-05-03 NOTE — Patient Instructions (Addendum)
Happy [birth year] Birthday!!!  Congrats on maintaining or losing weight for the past 14 months!!!!  Keep logging your food intake.   Congrats on signing up for silver sneakers!!!  Keep working on the activity- remember it is a new habit. Habits take time to establish.    Please stop at the front desk to schedule a follow up in 6 weeks.    Lupita Leash [phone]

## 2020-05-03 NOTE — Progress Notes (Signed)
Medical Nutrition Therapy:   Appt start time: 1315 end time:  1405 Total time:50 minutes Visit # 13  Goals- weight loss and maintenance with improved physical and mental health. "Self care" Lowest weight has been 330# Expected outcomes/Reasons for goals- improve and maintain her independence  Assessment: [name] presents today for nutritional counseling follow up for weight loss and Chronic Kidney disease.   She has not gained any weight in the last 14 months. Today she stated that she realizes she is "making excuses" instead of "reasons" for not being more active. She signed up for Silver sneakers earlier this week and participated one day, but she verbalized thinking that participating in this was doable and still wants to try using this as a way to increase her physical activity. She knows she can get out of bed and do things for herself, but she cannot figure out how to push herself (rates her motivation at a 1 out of 10). She has done very well keeping her food log in MyFitnessPal and attending support group. She talks to her sister every day and has neighbors who check on her.   ANTHROPOMETRICS: Estimated body mass index is 51.61 kg/m as calculated from the following:   Height as of 04/19/20: 5\' 8"  (1.727 m).   Weight as of this encounter: 339 lb 6.4 oz (154 kg).   Wt Readings from Last 10 Encounters:  05/03/20 (!) 339 lb 6.4 oz (154 kg)  04/19/20 (!) 340 lb 8 oz (154.4 kg)  04/02/20 (!) 346 lb 12.8 oz (157.3 kg)  03/15/20 (!) 349 lb 1.6 oz (158.4 kg)  02/02/20 (!) 354 lb 9.6 oz (160.8 kg)  01/05/20 (!) 355 lb 1.6 oz (161.1 kg)  12/22/19 (!) 351 lb 4.8 oz (159.3 kg)  11/10/19 (!) 353 lb 8 oz (160.3 kg)  09/29/19 (!) 352 lb 14.4 oz (160.1 kg)  08/09/19 (!) 359 lb 6.4 oz (163 kg)  Was 385.6# at the start and has achieved a 46.2# weight loss total and seems to be maintaining that loss. Her stated goal as of today is 330#. When she hits that goal she may think about getting into  the 200s.   SLEEP: not discussed today LABS: BMP Latest Ref Rng & Units 04/19/2020 03/15/2020 01/05/2020  Glucose 65 - 99 mg/dL 13/06/2019) 409(B) 353(G)  BUN 8 - 27 mg/dL 17 27 24   Creatinine 0.57 - 1.00 mg/dL 992(E) ) 2.68(T)  BUN/Creat Ratio 12 - 28 14 16 17   Sodium 134 - 144 mmol/L 137 136 138  Potassium 3.5 - 5.2 mmol/L 3.5 5.0 4.7  Chloride 96 - 106 mmol/L 96 100 102  CO2 20 - 29 mmol/L 19(L) 19(L) 16(L)  Calcium 8.7 - 10.3 mg/dL 9.5 9.8 9.6    DIETARY INTAKE:  Typically gets up at 11AM and just has 2 meals a day + occasional snack  Takes flaxseed and psyllium and keeps track of her sodium intake  Usual physical activity: According to her records in MyFitnessPal she had 0-35 min of physical activity in the past week.  Progress Towards Goal(s):  Some progress.   Nutritional Diagnosis:  NB-1.5 Disordered eating pattern As related to eating one meal a day and binging at other times has reoccured As evidenced by her report and food records.    Intervention:  Nutrition counseling using motivational interviewing to assist her with evaluating her progress and goals.  Continue attending support group and using MyFitnessPal  - Continue taking flaxseed or psyllium  - Continue  with silver sneakers!!!  Action Goal:   Outcome goal: Gain confidence and continue progress towards consistent meal and activity tracking, long term weight loss maintenance to reach/maintian her desired quality of life  Coordination of care: she does not think she needs follow up with pulmonary  Teaching Method Utilized: visual and  Auditory Handouts given during visit include:  After visit summary; Benefits of exercise Barriers to learning/adherence to lifestyle change: depression and life stressors Demonstrated degree of understanding via:  Verbalization   Monitoring/Evaluation:  Dietary intake and body weight 6 weeks Norm Parcel, RD 05/03/2020 2:25 PM.

## 2020-05-07 ENCOUNTER — Other Ambulatory Visit: Payer: Self-pay | Admitting: Dietician

## 2020-05-07 DIAGNOSIS — N183 Chronic kidney disease, stage 3 unspecified: Secondary | ICD-10-CM

## 2020-05-07 DIAGNOSIS — F331 Major depressive disorder, recurrent, moderate: Secondary | ICD-10-CM | POA: Diagnosis not present

## 2020-05-07 NOTE — Progress Notes (Signed)
2nd referral for Medical Nutrition Therapy in same calendar year

## 2020-05-08 ENCOUNTER — Other Ambulatory Visit: Payer: Self-pay

## 2020-05-08 ENCOUNTER — Telehealth: Payer: Self-pay | Admitting: Internal Medicine

## 2020-05-08 ENCOUNTER — Ambulatory Visit: Payer: Medicare HMO | Admitting: Internal Medicine

## 2020-05-08 DIAGNOSIS — I10 Essential (primary) hypertension: Secondary | ICD-10-CM

## 2020-05-08 NOTE — Telephone Encounter (Signed)
Pt requested a phone call regarding some paperwork for her supplemental oxygen. Chart reviewed. Ambulatory oximetry testing was performed in January 2022 which showed an ambulatory O2 saturation of 95% on room air. Oxygen order was discontinued at that visit.  Our clinic received paperwork from the supplier requesting a fax with the order and note from that visit.   I returned the call to [facility] and explained this to her. She expressed understanding.  I have given our office RN's the appropriate paperwork to fax back to the supplier to confirm discontinuation.  Elige Radon, MD Internal Medicine Resident PGY-2 [facility] Pager: [phone] 05/08/2020 3:15 PM

## 2020-05-09 DIAGNOSIS — M170 Bilateral primary osteoarthritis of knee: Secondary | ICD-10-CM | POA: Diagnosis not present

## 2020-05-09 DIAGNOSIS — J9611 Chronic respiratory failure with hypoxia: Secondary | ICD-10-CM | POA: Diagnosis not present

## 2020-05-09 DIAGNOSIS — N182 Chronic kidney disease, stage 2 (mild): Secondary | ICD-10-CM | POA: Diagnosis not present

## 2020-05-09 DIAGNOSIS — I129 Hypertensive chronic kidney disease with stage 1 through stage 4 chronic kidney disease, or unspecified chronic kidney disease: Secondary | ICD-10-CM | POA: Diagnosis not present

## 2020-05-09 DIAGNOSIS — J439 Emphysema, unspecified: Secondary | ICD-10-CM | POA: Diagnosis not present

## 2020-05-09 DIAGNOSIS — E782 Mixed hyperlipidemia: Secondary | ICD-10-CM | POA: Diagnosis not present

## 2020-05-09 DIAGNOSIS — F322 Major depressive disorder, single episode, severe without psychotic features: Secondary | ICD-10-CM | POA: Diagnosis not present

## 2020-05-09 DIAGNOSIS — G4733 Obstructive sleep apnea (adult) (pediatric): Secondary | ICD-10-CM | POA: Diagnosis not present

## 2020-05-09 DIAGNOSIS — K5730 Diverticulosis of large intestine without perforation or abscess without bleeding: Secondary | ICD-10-CM | POA: Diagnosis not present

## 2020-05-14 DIAGNOSIS — F331 Major depressive disorder, recurrent, moderate: Secondary | ICD-10-CM | POA: Diagnosis not present

## 2020-05-14 NOTE — Telephone Encounter (Signed)
Encounter opened in error

## 2020-05-16 DIAGNOSIS — R0902 Hypoxemia: Secondary | ICD-10-CM | POA: Diagnosis not present

## 2020-05-16 DIAGNOSIS — R0602 Shortness of breath: Secondary | ICD-10-CM | POA: Diagnosis not present

## 2020-05-16 DIAGNOSIS — R0603 Acute respiratory distress: Secondary | ICD-10-CM | POA: Diagnosis not present

## 2020-05-16 DIAGNOSIS — I272 Pulmonary hypertension, unspecified: Secondary | ICD-10-CM | POA: Diagnosis not present

## 2020-05-16 DIAGNOSIS — Z86718 Personal history of other venous thrombosis and embolism: Secondary | ICD-10-CM | POA: Diagnosis not present

## 2020-05-16 DIAGNOSIS — J9611 Chronic respiratory failure with hypoxia: Secondary | ICD-10-CM | POA: Diagnosis not present

## 2020-05-16 DIAGNOSIS — G4733 Obstructive sleep apnea (adult) (pediatric): Secondary | ICD-10-CM | POA: Diagnosis not present

## 2020-05-16 DIAGNOSIS — R0682 Tachypnea, not elsewhere classified: Secondary | ICD-10-CM | POA: Diagnosis not present

## 2020-05-18 DIAGNOSIS — M170 Bilateral primary osteoarthritis of knee: Secondary | ICD-10-CM | POA: Diagnosis not present

## 2020-05-18 DIAGNOSIS — N182 Chronic kidney disease, stage 2 (mild): Secondary | ICD-10-CM | POA: Diagnosis not present

## 2020-05-18 DIAGNOSIS — E782 Mixed hyperlipidemia: Secondary | ICD-10-CM | POA: Diagnosis not present

## 2020-05-18 DIAGNOSIS — F322 Major depressive disorder, single episode, severe without psychotic features: Secondary | ICD-10-CM | POA: Diagnosis not present

## 2020-05-18 DIAGNOSIS — K5730 Diverticulosis of large intestine without perforation or abscess without bleeding: Secondary | ICD-10-CM | POA: Diagnosis not present

## 2020-05-18 DIAGNOSIS — J439 Emphysema, unspecified: Secondary | ICD-10-CM | POA: Diagnosis not present

## 2020-05-18 DIAGNOSIS — I129 Hypertensive chronic kidney disease with stage 1 through stage 4 chronic kidney disease, or unspecified chronic kidney disease: Secondary | ICD-10-CM | POA: Diagnosis not present

## 2020-05-18 DIAGNOSIS — G4733 Obstructive sleep apnea (adult) (pediatric): Secondary | ICD-10-CM | POA: Diagnosis not present

## 2020-05-18 DIAGNOSIS — J9611 Chronic respiratory failure with hypoxia: Secondary | ICD-10-CM | POA: Diagnosis not present

## 2020-05-23 DIAGNOSIS — M170 Bilateral primary osteoarthritis of knee: Secondary | ICD-10-CM | POA: Diagnosis not present

## 2020-05-23 DIAGNOSIS — J9611 Chronic respiratory failure with hypoxia: Secondary | ICD-10-CM | POA: Diagnosis not present

## 2020-05-23 DIAGNOSIS — J439 Emphysema, unspecified: Secondary | ICD-10-CM | POA: Diagnosis not present

## 2020-05-23 DIAGNOSIS — N182 Chronic kidney disease, stage 2 (mild): Secondary | ICD-10-CM | POA: Diagnosis not present

## 2020-05-23 DIAGNOSIS — E782 Mixed hyperlipidemia: Secondary | ICD-10-CM | POA: Diagnosis not present

## 2020-05-23 DIAGNOSIS — I129 Hypertensive chronic kidney disease with stage 1 through stage 4 chronic kidney disease, or unspecified chronic kidney disease: Secondary | ICD-10-CM | POA: Diagnosis not present

## 2020-05-23 DIAGNOSIS — G4733 Obstructive sleep apnea (adult) (pediatric): Secondary | ICD-10-CM | POA: Diagnosis not present

## 2020-05-23 DIAGNOSIS — F322 Major depressive disorder, single episode, severe without psychotic features: Secondary | ICD-10-CM | POA: Diagnosis not present

## 2020-05-23 DIAGNOSIS — K5730 Diverticulosis of large intestine without perforation or abscess without bleeding: Secondary | ICD-10-CM | POA: Diagnosis not present

## 2020-05-28 DIAGNOSIS — F331 Major depressive disorder, recurrent, moderate: Secondary | ICD-10-CM | POA: Diagnosis not present

## 2020-06-04 DIAGNOSIS — F331 Major depressive disorder, recurrent, moderate: Secondary | ICD-10-CM | POA: Diagnosis not present

## 2020-06-05 DIAGNOSIS — E782 Mixed hyperlipidemia: Secondary | ICD-10-CM | POA: Diagnosis not present

## 2020-06-05 DIAGNOSIS — F322 Major depressive disorder, single episode, severe without psychotic features: Secondary | ICD-10-CM | POA: Diagnosis not present

## 2020-06-05 DIAGNOSIS — I129 Hypertensive chronic kidney disease with stage 1 through stage 4 chronic kidney disease, or unspecified chronic kidney disease: Secondary | ICD-10-CM | POA: Diagnosis not present

## 2020-06-05 DIAGNOSIS — J439 Emphysema, unspecified: Secondary | ICD-10-CM | POA: Diagnosis not present

## 2020-06-05 DIAGNOSIS — N182 Chronic kidney disease, stage 2 (mild): Secondary | ICD-10-CM | POA: Diagnosis not present

## 2020-06-05 DIAGNOSIS — G4733 Obstructive sleep apnea (adult) (pediatric): Secondary | ICD-10-CM | POA: Diagnosis not present

## 2020-06-05 DIAGNOSIS — J9611 Chronic respiratory failure with hypoxia: Secondary | ICD-10-CM | POA: Diagnosis not present

## 2020-06-05 DIAGNOSIS — K5730 Diverticulosis of large intestine without perforation or abscess without bleeding: Secondary | ICD-10-CM | POA: Diagnosis not present

## 2020-06-05 DIAGNOSIS — M170 Bilateral primary osteoarthritis of knee: Secondary | ICD-10-CM | POA: Diagnosis not present

## 2020-06-16 DIAGNOSIS — J9611 Chronic respiratory failure with hypoxia: Secondary | ICD-10-CM | POA: Diagnosis not present

## 2020-06-16 DIAGNOSIS — R0682 Tachypnea, not elsewhere classified: Secondary | ICD-10-CM | POA: Diagnosis not present

## 2020-06-16 DIAGNOSIS — R0602 Shortness of breath: Secondary | ICD-10-CM | POA: Diagnosis not present

## 2020-06-16 DIAGNOSIS — Z86718 Personal history of other venous thrombosis and embolism: Secondary | ICD-10-CM | POA: Diagnosis not present

## 2020-06-16 DIAGNOSIS — G4733 Obstructive sleep apnea (adult) (pediatric): Secondary | ICD-10-CM | POA: Diagnosis not present

## 2020-06-16 DIAGNOSIS — R0603 Acute respiratory distress: Secondary | ICD-10-CM | POA: Diagnosis not present

## 2020-06-16 DIAGNOSIS — R0902 Hypoxemia: Secondary | ICD-10-CM | POA: Diagnosis not present

## 2020-06-16 DIAGNOSIS — I272 Pulmonary hypertension, unspecified: Secondary | ICD-10-CM | POA: Diagnosis not present

## 2020-06-28 ENCOUNTER — Ambulatory Visit (INDEPENDENT_AMBULATORY_CARE_PROVIDER_SITE_OTHER): Payer: Medicare HMO | Admitting: Internal Medicine

## 2020-06-28 ENCOUNTER — Encounter: Payer: Self-pay | Admitting: Internal Medicine

## 2020-06-28 ENCOUNTER — Ambulatory Visit: Payer: Medicare HMO | Admitting: Dietician

## 2020-06-28 ENCOUNTER — Encounter: Payer: Medicare HMO | Admitting: Dietician

## 2020-06-28 DIAGNOSIS — N3946 Mixed incontinence: Secondary | ICD-10-CM

## 2020-06-28 DIAGNOSIS — M170 Bilateral primary osteoarthritis of knee: Secondary | ICD-10-CM

## 2020-06-28 DIAGNOSIS — E042 Nontoxic multinodular goiter: Secondary | ICD-10-CM | POA: Diagnosis not present

## 2020-06-28 DIAGNOSIS — Z0000 Encounter for general adult medical examination without abnormal findings: Secondary | ICD-10-CM

## 2020-06-28 DIAGNOSIS — I10 Essential (primary) hypertension: Secondary | ICD-10-CM | POA: Diagnosis not present

## 2020-06-28 DIAGNOSIS — N1831 Chronic kidney disease, stage 3a: Secondary | ICD-10-CM

## 2020-06-28 DIAGNOSIS — R55 Syncope and collapse: Secondary | ICD-10-CM | POA: Insufficient documentation

## 2020-06-28 DIAGNOSIS — F322 Major depressive disorder, single episode, severe without psychotic features: Secondary | ICD-10-CM

## 2020-06-28 HISTORY — DX: Syncope and collapse: R55

## 2020-06-28 LAB — GLUCOSE, CAPILLARY: Glucose-Capillary: 131 mg/dL — ABNORMAL HIGH (ref 70–99)

## 2020-06-28 MED ORDER — PRAVASTATIN SODIUM 40 MG PO TABS: 40.0000 mg | ORAL_TABLET | Freq: Every day | ORAL | 3 refills | Status: DC

## 2020-06-28 NOTE — Progress Notes (Signed)
[name] presents for planned 2 M f/u of chronic conditions including major depression with apathy, HTN with recent decrease in medication requirement, obesity with slow and steady success with weight loss, arthritis pain in knees, and bothersome urinary incontinence for which medications were not effective (subsequently stopped with no exacerbation of sxs).  She wishes to report the following updates: Heart rate increases with exertion, returns to nml with rest. Knees are hurting, particularly after sitting for prolonged period. Strange brief crawling sensation on back of lower R leg when standing - has happened twice  Urinary incontinence - doing well without medications.  BSC is directly at bedside so functional incontinence is less an issue.    No weight gain in 14 months!  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  Only problem is lack of initiative - "I can do everything, I just don't want to!"  Patient Active Problem List   Diagnosis Date Noted  . Multinodular goiter 06/21/2019  . Centrilobular emphysema (HCC) 03/12/2019  . Abdominal aortic atherosclerosis (HCC) 02/13/2019  . Chronic cough 02/04/2018  . No natural teeth 12/18/2016  . Mixed incontinence 04/05/2015  . Chronic kidney disease, stage 3 unspecified (HCC) 07/28/2014  . Osteopenia 03/08/2014  . Preventative health care 10/21/2010  . Hyperlipidemia 03/07/2006  . Morbid obesity with body mass index (BMI) of 50.0 to 59.9 in adult (HCC) 01/19/2006  . Severe major depression (HCC) 01/19/2006  . OSA (obstructive sleep apnea) 01/19/2006  . HTN (hypertension) 01/19/2006  . OA  (osteoarthritis) of knee 01/19/2006  . History of hepatitis B 01/19/2006  . PULMONARY EMBOLISM, HX OF 01/19/2006    Current Outpatient Medications:  .  ALPRAZolam (XANAX) 1 MG tablet, Take 1 tablet by mouth at bedtime., Disp: , Rfl:  .  amLODipine (NORVASC) 5 MG tablet, Take 1 tablet (5 mg total) by mouth daily., Disp: 90 tablet, Rfl: 3 .  buPROPion (WELLBUTRIN SR) 150 MG 12 hr tablet, Take 150 mg by mouth in the morning and at bedtime., Disp: , Rfl:  .  Multiple Vitamin (MULTIVITAMIN) tablet, Take 1 tablet by mouth daily. (Patient taking differently: Take 1 tablet by mouth every evening. ), Disp: 30 tablet, Rfl: 3 .  Omega-3 Fatty Acids (OMEGA-3 FISH OIL PO), Take 1 capsule by mouth every evening. , Disp: , Rfl:  .  pravastatin (PRAVACHOL) 40 MG tablet, TAKE 1 TABLET EVERY DAY, Disp: 90 tablet, Rfl: 3 .  vortioxetine HBr (TRINTELLIX) 5 MG TABS tablet, Take 1 tablet (5 mg total) by mouth every evening., Disp: , Rfl:  .  XARELTO 20 MG TABS tablet, TAKE 1 TABLET EVERY DAY WITH SUPPER, Disp: 90 tablet, Rfl: 3   Objective:  BP (!) 110/51 (BP Location: Right Arm, Patient Position: Sitting, Cuff Size: Normal)   Pulse 93   Temp 98.3 F (36.8 C) (Oral)   Ht 5\' 8"  (1.727 m)   Wt (!) 337 lb 8 oz (153.1 kg)   SpO2 98%   BMI 51.32 kg/m  General habitus obese; affect is brighter, more interactive, more responsive to questions particularly compared to several months ago.  Heart RRR, lungs clear with good airflow.  No LE edema.  Walked to visit using rollator.  Assessment and plan: See also problem based charting.  Ms. Parcell is stable per her report and on f/u, though unexpectedly experienced a vagal presyncopal episodes at the close of the visit today.  See problem list.  Only change in therapy at this time is cessation of her only antihypertensive, amlodipine 5 mg, with plan for recheck soon.  She will reschedule her missed appt with dietician.   F/u with me in 3 months.

## 2020-06-28 NOTE — Patient Instructions (Addendum)
Ms. Lewellyn,  I enjoyed catching up with you today, as always. Today your blood pressure looks wonderful even though you are no longer taking the losartan.  Your weight is down a couple of pounds, you no longer need to use oxygen supplement - these are all good news!  Your knees are giving you problems, and you still have the opportunity to take the tylenol if you have a need, or you might try something called Blue Emu, a product over the counter which you rub onto the knees.  No changes in medicine today, and no blood work needed!    Take care and stay well.  I'll see you again in 3 months.  Dr. Mayford Knife  GO GET A PET!!!!!

## 2020-06-29 NOTE — Assessment & Plan Note (Signed)
Improvement noted at last visit to early stage 3a following cessation of ARB. No labs today, check next visit.  She has not been hydrating adequately (drinks water but urine is always dark), increasing risk of prerenal AKI.

## 2020-06-29 NOTE — Assessment & Plan Note (Signed)
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 Evelene Croon, has noticed some increased initiative "now I might actually wash a pan when I go to the kitchen, I usually just let them pile up". Slipping into old patterns of "taking to the bed", ignoring hygiene, ignoring home upkeep now that [facility] aide and home therapy have been completed.  Only problem is lack of initiative - "I can do everything, I just don't want to!"  She is considering getting a pet dog so that she has something to do, "I need a purpose".

## 2020-06-29 NOTE — Assessment & Plan Note (Signed)
TSH nml 04/2020, asymptomatic. She has been reticent to have annual US done (depession and apathy cause lack of desire to leave the house) and other issues have lately taken precedent.  I'll continue to discuss with her.

## 2020-06-29 NOTE — Assessment & Plan Note (Addendum)
Symptoms are minimally problematic despite discontinuation of bladder medications. BSC is literally at her bedside thus minimizing the functional component of her UI.  Monitor.

## 2020-06-29 NOTE — Assessment & Plan Note (Signed)
BP overly controlled.  Decreasing treatment requirement as she has lost weight.  Hold amlodipine 5 mg for now and recheck in a couple of weeks.  She spends most of her hours in recumbent position in bed by preference (poor motivation to get OOB). As such she probably has some occasional orthostasis (negative on testing today).

## 2020-06-29 NOTE — Assessment & Plan Note (Addendum)
No acute findings on knee exam today.  Pain is only during weight bearing, which she does minimally due to her sedentary lifestyle.  BSC is by her bed, where she spends most of her time.  She has not yet begun taking tylenol "I don't need it if I don't get up".  Encouraged her to use it so that she can get up!  Also mentioned Blue Emu OTC topical. She already uses IcyHot.

## 2020-07-02 DIAGNOSIS — F331 Major depressive disorder, recurrent, moderate: Secondary | ICD-10-CM | POA: Diagnosis not present

## 2020-07-05 ENCOUNTER — Ambulatory Visit (INDEPENDENT_AMBULATORY_CARE_PROVIDER_SITE_OTHER): Payer: Medicare HMO | Admitting: Dietician

## 2020-07-05 ENCOUNTER — Encounter: Payer: Self-pay | Admitting: Dietician

## 2020-07-05 DIAGNOSIS — N1831 Chronic kidney disease, stage 3a: Secondary | ICD-10-CM | POA: Diagnosis not present

## 2020-07-05 NOTE — Progress Notes (Signed)
Medical Nutrition Therapy:   Appt start time: 1020 end time:  1110 Total time:50 minutes Visit # 15  Goals- weight loss and maintenance with improved physical and mental health. "Self care" Lowest weight has been 330# Expected outcomes/Reasons for goals- improve and maintain her independence  Assessment: [name] presents today for nutritional counseling follow up for weight loss and Chronic Kidney disease.   She continues to log her food intake in Myfitness pal, works with her psychiatrist and counselor and peer support group on moving more, healthy coping with emotions and for her depression. She is still working on what is getting in her way of moving more. She signed up for Silver sneakers virtually and did it three times but cannot bring herself to do it again. Her blood pressure was excellent today off all of her blood pressure medicines and her kidney function has also improved. Today we discussed the importance of moving more and bowel health to long tern weight maintenance.   ANTHROPOMETRICS: Estimated body mass index is 50.97 kg/m as calculated from the following:   Height as of 06/28/20: 5\' 8"  (1.727 m).   Weight as of this encounter: 335 lb 3.2 oz (152 kg).   Wt Readings from Last 10 Encounters:  07/05/20 (!) 335 lb 3.2 oz (152 kg)  06/28/20 (!) 337 lb 8 oz (153.1 kg)  05/03/20 (!) 339 lb 6.4 oz (154 kg)  04/19/20 (!) 340 lb 8 oz (154.4 kg)  04/02/20 (!) 346 lb 12.8 oz (157.3 kg)  03/15/20 (!) 349 lb 1.6 oz (158.4 kg)  02/02/20 (!) 354 lb 9.6 oz (160.8 kg)  01/05/20 (!) 355 lb 1.6 oz (161.1 kg)  12/22/19 (!) 351 lb 4.8 oz (159.3 kg)  11/10/19 (!) 353 lb 8 oz (160.3 kg)  Was 385.6# at the start and has achieved a 48.8# weight loss total and is to be maintaining that loss. Her stated goal is 330#. When she hits that goal she may think about further weight loss goals.   SLEEP:bothered by getting up to urinate 3+ times a night, gets most sleep in early morning  DIETARY  INTAKE:  Typically gets up at 11AM and just has 2 meals a day + a snack or a protein shake for her third meal. She is working on eating more fruits and whole grains.  Takes flaxseed and psyllium and keeps track of her sodium intake   Usual physical activity: According to her records in MyFitnessPal she had 0-15 min of physical activity in the past week.  Progress Towards Goal(s):  Some progress.   Nutritional Diagnosis:  NB-1.5 Disordered eating pattern As related to eating one meal a day and binging at other times is improving gradually and has reoccured As evidenced by her report and food records.    Intervention:  Nutrition counseling using motivational interviewing to assist her with evaluating her progress and goals.  Continue attending support group and using MyFitnessPal  - Continue taking flaxseed( ground)  or psyllium  - Continue to work on identifying barriers to moving more.  Action Goal:   Outcome goal: Gain confidence and continue progress towards consistent meal and activity tracking, long term weight loss maintenance to reach/maintian her desired quality of life  Coordination of care: communicate blood pressure to her doctor  Teaching Method Utilized: visual and  Auditory Handouts given during visit include:  After visit summary; Benefits of exercise Barriers to learning/adherence to lifestyle change: depression and life stressors Demonstrated degree of understanding via:  Verbalization  Monitoring/Evaluation:  Dietary intake and body weight 6 weeks Norm Parcel, RD 07/05/2020 1:10 PM.              Emotional eating: working on with therapist  Physical activity: still working on   Blood pressure after 5 minutes at rest or more:   Feels like she lost consciousness when here last week Water intake- 6- 8 jars a day; interfering with sleep- getting up 2-3 times a night to urinate

## 2020-07-05 NOTE — Patient Instructions (Addendum)
Consider stopping fluid intake 2-3 hours before bedtime  You are doing great!  We talked about working in more whole grains and fruit  Keep working in identifying what barriers exist in moving more....  Well see in you 6 weeks  [name] [phone]

## 2020-07-09 DIAGNOSIS — F331 Major depressive disorder, recurrent, moderate: Secondary | ICD-10-CM | POA: Diagnosis not present

## 2020-07-16 DIAGNOSIS — R0902 Hypoxemia: Secondary | ICD-10-CM | POA: Diagnosis not present

## 2020-07-16 DIAGNOSIS — I272 Pulmonary hypertension, unspecified: Secondary | ICD-10-CM | POA: Diagnosis not present

## 2020-07-16 DIAGNOSIS — R0682 Tachypnea, not elsewhere classified: Secondary | ICD-10-CM | POA: Diagnosis not present

## 2020-07-16 DIAGNOSIS — R0602 Shortness of breath: Secondary | ICD-10-CM | POA: Diagnosis not present

## 2020-07-16 DIAGNOSIS — G4733 Obstructive sleep apnea (adult) (pediatric): Secondary | ICD-10-CM | POA: Diagnosis not present

## 2020-07-16 DIAGNOSIS — J9611 Chronic respiratory failure with hypoxia: Secondary | ICD-10-CM | POA: Diagnosis not present

## 2020-07-16 DIAGNOSIS — F331 Major depressive disorder, recurrent, moderate: Secondary | ICD-10-CM | POA: Diagnosis not present

## 2020-07-16 DIAGNOSIS — R0603 Acute respiratory distress: Secondary | ICD-10-CM | POA: Diagnosis not present

## 2020-07-16 DIAGNOSIS — Z86718 Personal history of other venous thrombosis and embolism: Secondary | ICD-10-CM | POA: Diagnosis not present

## 2020-07-19 ENCOUNTER — Ambulatory Visit (INDEPENDENT_AMBULATORY_CARE_PROVIDER_SITE_OTHER): Payer: Medicare HMO | Admitting: Student

## 2020-07-19 ENCOUNTER — Encounter: Payer: Self-pay | Admitting: Student

## 2020-07-19 ENCOUNTER — Other Ambulatory Visit: Payer: Self-pay

## 2020-07-19 DIAGNOSIS — Z0000 Encounter for general adult medical examination without abnormal findings: Secondary | ICD-10-CM

## 2020-07-19 NOTE — Patient Instructions (Addendum)
Things That May Be Affecting Your Health:  Alcohol  Hearing loss  Pain   X Depression  Home Safety  Sexual Health   Diabetes X Lack of physical activity  Stress   Difficulty with daily activities  Loneliness  Tiredness   Drug use  Medicines  Tobacco use   Falls  Motor Vehicle Safety  Weight   Food choices  Oral Health  Other    YOUR PERSONALIZED HEALTH PLAN : 1. Schedule your next subsequent Medicare Wellness visit in one year 2. Attend all of your regular appointments to address your medical issues 3. Complete the preventative screenings and services 4. Congratulations on surpassing the 5% weight loss goal you made at last [facility] on 02/04/2019! You made another 10 lb weight loss goal today. Continue to work with Tobey Bride. 5. Congratulations on your goal to sit outside 1 day per week for 10 minutes! And your goal to look into fostering a pet! 6. Please do the exercises from your PT/OT for 10 minutes one day per week. 7. You are scheduled to have a mammogram on 08/23/2020 at 3:30. 8. We scheduled your 3 month f/u with Dr Mayford Knife for 09/27/2020 at 9:45  Annual Wellness Visit                       Medicare Covered Preventative Screenings and Services  Services & Screenings Men and Women Who How Often Need? Date of Last Service Action  Abdominal Aortic Aneurysm Adults with AAA risk factors Once     Alcohol Misuse and Counseling All Adults Screening once a year if no alcohol misuse. Counseling up to 4 face to face sessions.     Bone Density Measurement  Adults at risk for osteoporosis Once every 2 yrs     Lipid Panel Z13.6 All adults without CV disease Once every 5 yrs  01/2020   Colorectal Cancer   Stool sample or  Colonoscopy All adults 50 and older   Once every year  Every 10 years  Due 2025   Depression All Adults Once a year X (though she has an established diagnosis) Today   Diabetes Screening Blood glucose, post glucose load, or GTT Z13.1   All adults at risk  Pre-diabetics  Once per year  Twice per year     Diabetes  Self-Management Training All adults Diabetics 10 hrs first year; 2 hours subsequent years. Requires Copay     Glaucoma  Diabetics  Family history of glaucoma  African Americans 50 yrs +  Hispanic Americans 65 yrs + Annually - requires coppay x    Hepatitis C Z72.89 or F19.20  High Risk for HCV  Born between 1945 and 1965  Annually  Once Needs Hep C screen    HIV Z11.4 All adults based on risk  Annually btw ages 64 & 28 regardless of risk  Annually > 65 yrs if at increased risk     Lung Cancer Screening Asymptomatic adults aged 52-77 with 30 pack yr history and current smoker OR quit within the last 15 yrs Annually Must have counseling and shared decision making documentation before first screen X  Discuss with Dr. Mayford Knife at next visit  Medical Nutrition Therapy Adults with   Diabetes  Renal disease  Kidney transplant within past 3 yrs 3 hours first year; 2 hours subsequent years     Obesity and Counseling All adults Screening once a year Counseling if BMI 30 or higher x Today Working with Lupita Leash  Tobacco Use Counseling Adults who use tobacco  Up to 8 visits in one year     Vaccines Z23  Hepatitis B  Influenza   Pneumonia  Adults   Once  Once every flu season  Two different vaccines separated by one year   Yearly flu vaccine starting September 1   Next Annual Wellness Visit People with Medicare Every year  Today     Services & Screenings Women Who How Often Need  Date of Last Service Action  Mammogram  Z12.31 Women over 40 One baseline ages 7535-39. Annually ager 40 yrs+ Due 04/2020  Appt 08/23/2020  Pap tests All women Annually if high risk. Every 2 yrs for normal risk women     Screening for cervical cancer with   Pap (Z01.419 nl or Z01.411abnl) &  HPV Z11.51 Women aged 73 to 3365 Once every 5 yrs     Screening pelvic and breast exams All  women Annually if high risk. Every 2 yrs for normal risk women     Sexually Transmitted Diseases  Chlamydia  Gonorrhea  Syphilis All at risk adults Annually for non pregnant females at increased risk         Services & Screenings Men Who How Ofter Need  Date of Last Service Action  Prostate Cancer - DRE & PSA Men over 50 Annually.  DRE might require a copay.     Sexually Transmitted Diseases  Syphilis All at risk adults Annually for men at increased risk        Fall Prevention in the Home, Adult Falls can cause injuries and can happen to people of all ages. There are many things you can do to make your home safe and to help prevent falls. Ask for help when making these changes. What actions can I take to prevent falls? General Instructions  Use good lighting in all rooms. Replace any light bulbs that burn out.  Turn on the lights in dark areas. Use night-lights.  Keep items that you use often in easy-to-reach places. Lower the shelves around your home if needed.  Set up your furniture so you have a clear path. Avoid moving your furniture around.  Do not have throw rugs or other things on the floor that can make you trip.  Avoid walking on wet floors.  If any of your floors are uneven, fix them.  Add color or contrast paint or tape to clearly mark and help you see: ? Grab bars or handrails. ? First and last steps of staircases. ? Where the edge of each step is.  If you use a stepladder: ? Make sure that it is fully opened. Do not climb a closed stepladder. ? Make sure the sides of the stepladder are locked in place. ? Ask someone to hold the stepladder while you use it.  Know where your pets are when moving through your home. What can I do in the bathroom?  Keep the floor dry. Clean up any water on the floor right away.  Remove soap buildup in the tub or shower.  Use nonskid mats or decals on the floor of the tub or shower.  Attach bath mats  securely with double-sided, nonslip rug tape.  If you need to sit down in the shower, use a plastic, nonslip stool.  Install grab bars by the toilet and in the tub and shower. Do not use towel bars as grab bars.      What can I do in the bedroom?  Make sure that you have a light by your bed that is easy to reach.  Do not use any sheets or blankets for your bed that hang to the floor.  Have a firm chair with side arms that you can use for support when you get dressed. What can I do in the kitchen?  Clean up any spills right away.  If you need to reach something above you, use a step stool with a grab bar.  Keep electrical cords out of the way.  Do not use floor polish or wax that makes floors slippery. What can I do with my stairs?  Do not leave any items on the stairs.  Make sure that you have a light switch at the top and the bottom of the stairs.  Make sure that there are handrails on both sides of the stairs. Fix handrails that are broken or loose.  Install nonslip stair treads on all your stairs.  Avoid having throw rugs at the top or bottom of the stairs.  Choose a carpet that does not hide the edge of the steps on the stairs.  Check carpeting to make sure that it is firmly attached to the stairs. Fix carpet that is loose or worn. What can I do on the outside of my home?  Use bright outdoor lighting.  Fix the edges of walkways and driveways and fix any cracks.  Remove anything that might make you trip as you walk through a door, such as a raised step or threshold.  Trim any bushes or trees on paths to your home.  Check to see if handrails are loose or broken and that both sides of all steps have handrails.  Install guardrails along the edges of any raised decks and porches.  Clear paths of anything that can make you trip, such as tools or rocks.  Have leaves, snow, or ice cleared regularly.  Use sand or salt on paths during winter.  Clean up any spills  in your garage right away. This includes grease or oil spills. What other actions can I take?  Wear shoes that: ? Have a low heel. Do not wear high heels. ? Have rubber bottoms. ? Feel good on your feet and fit well. ? Are closed at the toe. Do not wear open-toe sandals.  Use tools that help you move around if needed. These include: ? Canes. ? Walkers. ? Scooters. ? Crutches.  Review your medicines with your doctor. Some medicines can make you feel dizzy. This can increase your chance of falling. Ask your doctor what else you can do to help prevent falls. Where to find more information  Centers for Disease Control and Prevention, [employer]: FootballExhibition.com.br  [employer] on Aging: https://walker.com/ Contact a doctor if:  You are afraid of falling at home.  You feel weak, drowsy, or dizzy at home.  You fall at home. Summary  There are many simple things that you can do to make your home safe and to help prevent falls.  Ways to make your home safe include removing things that can make you trip and installing grab bars in the bathroom.  Ask for help when making these changes in your home. This information is not intended to replace advice given to you by your health care provider. Make sure you discuss any questions you have with your health care provider. Document Revised: 09/21/2019 Document Reviewed: 09/21/2019 Elsevier Patient Education  2021 Elsevier Inc.   Health Maintenance, Female Adopting a healthy lifestyle  and getting preventive care are important in promoting health and wellness. Ask your health care provider about:  The right schedule for you to have regular tests and exams.  Things you can do on your own to prevent diseases and keep yourself healthy. What should I know about diet, weight, and exercise? Eat a healthy diet  Eat a diet that includes plenty of vegetables, fruits, low-fat dairy products, and lean protein.  Do not eat a lot of foods that are high  in solid fats, added sugars, or sodium.   Maintain a healthy weight Body mass index (BMI) is used to identify weight problems. It estimates body fat based on height and weight. Your health care provider can help determine your BMI and help you achieve or maintain a healthy weight. Get regular exercise Get regular exercise. This is one of the most important things you can do for your health. Most adults should:  Exercise for at least 150 minutes each week. The exercise should increase your heart rate and make you sweat (moderate-intensity exercise).  Do strengthening exercises at least twice a week. This is in addition to the moderate-intensity exercise.  Spend less time sitting. Even light physical activity can be beneficial. Watch cholesterol and blood lipids Have your blood tested for lipids and cholesterol at 73 years of age, then have this test every 5 years. Have your cholesterol levels checked more often if:  Your lipid or cholesterol levels are high.  You are older than 73 years of age.  You are at high risk for heart disease. What should I know about cancer screening? Depending on your health history and family history, you may need to have cancer screening at various ages. This may include screening for:  Breast cancer.  Cervical cancer.  Colorectal cancer.  Skin cancer.  Lung cancer. What should I know about heart disease, diabetes, and high blood pressure? Blood pressure and heart disease  High blood pressure causes heart disease and increases the risk of stroke. This is more likely to develop in people who have high blood pressure readings, are of African descent, or are overweight.  Have your blood pressure checked: ? Every 3-5 years if you are 30-55 years of age. ? Every year if you are 29 years old or older. Diabetes Have regular diabetes screenings. This checks your fasting blood sugar level. Have the screening done:  Once every three years after age 40 if  you are at a normal weight and have a low risk for diabetes.  More often and at a younger age if you are overweight or have a high risk for diabetes. What should I know about preventing infection? Hepatitis B If you have a higher risk for hepatitis B, you should be screened for this virus. Talk with your health care provider to find out if you are at risk for hepatitis B infection. Hepatitis C Testing is recommended for:  Everyone born from [birth year] through 1963.  Anyone with known risk factors for hepatitis C. Sexually transmitted infections (STIs)  Get screened for STIs, including gonorrhea and chlamydia, if: ? You are sexually active and are younger than 73 years of age. ? You are older than 73 years of age and your health care provider tells you that you are at risk for this type of infection. ? Your sexual activity has changed since you were last screened, and you are at increased risk for chlamydia or gonorrhea. Ask your health care provider if you are at risk.  Ask your health care provider about whether you are at high risk for HIV. Your health care provider may recommend a prescription medicine to help prevent HIV infection. If you choose to take medicine to prevent HIV, you should first get tested for HIV. You should then be tested every 3 months for as long as you are taking the medicine. Pregnancy  If you are about to stop having your period (premenopausal) and you may become pregnant, seek counseling before you get pregnant.  Take 400 to 800 micrograms (mcg) of folic acid every day if you become pregnant.  Ask for birth control (contraception) if you want to prevent pregnancy. Osteoporosis and menopause Osteoporosis is a disease in which the bones lose minerals and strength with aging. This can result in bone fractures. If you are 103 years old or older, or if you are at risk for osteoporosis and fractures, ask your health care provider if you should:  Be screened for bone  loss.  Take a calcium or vitamin D supplement to lower your risk of fractures.  Be given hormone replacement therapy (HRT) to treat symptoms of menopause. Follow these instructions at home: Lifestyle  Do not use any products that contain nicotine or tobacco, such as cigarettes, e-cigarettes, and chewing tobacco. If you need help quitting, ask your health care provider.  Do not use street drugs.  Do not share needles.  Ask your health care provider for help if you need support or information about quitting drugs. Alcohol use  Do not drink alcohol if: ? Your health care provider tells you not to drink. ? You are pregnant, may be pregnant, or are planning to become pregnant.  If you drink alcohol: ? Limit how much you use to 0-1 drink a day. ? Limit intake if you are breastfeeding.  Be aware of how much alcohol is in your drink. In the [place] one drink equals one 12 oz bottle of beer (355 mL), one 5 oz glass of wine (148 mL), or one 1 oz glass of hard liquor (44 mL). General instructions  Schedule regular health, dental, and eye exams.  Stay current with your vaccines.  Tell your health care provider if: ? You often feel depressed. ? You have ever been abused or do not feel safe at home. Summary  Adopting a healthy lifestyle and getting preventive care are important in promoting health and wellness.  Follow your health care provider's instructions about healthy diet, exercising, and getting tested or screened for diseases.  Follow your health care provider's instructions on monitoring your cholesterol and blood pressure. This information is not intended to replace advice given to you by your health care provider. Make sure you discuss any questions you have with your health care provider. Document Revised: 02/10/2018 Document Reviewed: 02/10/2018 Elsevier Patient Education  2021 [employer].

## 2020-07-19 NOTE — Progress Notes (Signed)
This AWV is being conducted by TELEHEALTH - AUDIO only. The patient was located at home and I was located in [facility]. The patient's identity was confirmed using their DOB and current address. The patient or his/her legal guardian has consented to being evaluated through a telephone encounter and understands the associated risks (an examination cannot be done and the patient may need to come in for an appointment) / benefits (allows the patient to remain at home, decreasing exposure to coronavirus). I personally spent 38 minutes conducting the AWV.  Subjective:   [name] is a 75 y.o. female who presents for a Medicare Annual Wellness Visit.  The following items have been reviewed and updated today in the appropriate area in the EMR.   Health Risk Assessment  Height, weight, BMI, and BP Visual acuity if needed Depression screen Fall risk / safety level Advance directive discussion Medical and family history were reviewed and updated Updating list of other providers & suppliers Medication reconciliation, including over the counter medicines Cognitive screen Written screening schedule Risk Factor list Personalized health advice, risky behaviors, and treatment advice  Social History   Social History Narrative   Grew up in government housing. Received college education, had career Education officer, community, Veterinary surgeon, others), and own home before depression dx. Now back in government housing.    Started smoking in her teens and quit in her 82s.  She never smoked more than 1 pack/day.  Likely, less than a 20-pack-year history      Current Social History 07/19/2020      Patient lives alone in a ground floor apartment which is 1 story. There are not steps up to the entrance the patient uses.       Patient's method of transportation is personal car.      The highest level of education was Bachelor's Degree.      The patient currently retired      Identified important Relationships are "I  talk with my sister in [place] every night."       Pets : "I had to put down my 28.5 yo dog in July 2020." Made a goal to look into fostering a pet.       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Shawntay Prest, BSN, RN-BC             Objective:    Vitals: There were no vitals taken for this visit. Vitals are unable to obtained due to COVID-19 public health emergency  Activities of Daily Living In your present state of health, do you have any difficulty performing the following activities: 07/19/2020 06/28/2020  Hearing? N N  Vision? Y Y  Comment Needs cataract surgery -  Difficulty concentrating or making decisions? Y N  Walking or climbing stairs? Y Y  Dressing or bathing? Y N  Comment bathing -  Doing errands, shopping? N N  Some recent data might be hidden    Goals Goals    .  Blood Pressure < 140/90    .  Exercise 1x per week (10 min per time)      Sitting and standing exercises with exercise band    .  Look into fostering a pet    .  Sit outside for 10 minutes one day per week    .  Weight (lb) < 330 lb (149.7 kg) (pt-stated)      Working with nutritionist at [facility]       Fall Risk Fall Risk  07/19/2020 06/28/2020 05/08/2020 04/19/2020 03/15/2020  Falls in the past year? 1 0 0 1 1  Comment - - - - -  Number falls in past yr: 0 0 - 0 0  Injury with Fall? 1 1 - 0 0  Risk Factor Category  - - - - -  Risk for fall due to : History of fall(s);Impaired balance/gait;Impaired mobility - History of fall(s);Impaired balance/gait;Impaired mobility - -  Risk for fall due to: Comment - - - - -  Follow up Education provided;Falls prevention discussed - Falls prevention discussed - -   Patient has exercises given to her by PT/OT. She will do these for 10 minutes 1 day per week.  Depression Screen PHQ 2/9 Scores 07/19/2020 06/28/2020 05/08/2020 01/05/2020   PHQ - 2 Score 5 - 0 6  PHQ- 9 Score 15 - 2 21  Exception Documentation - (No Data) - -    Patient has counselor with [facility] [employer] that she talks to every Monday at 1100. Also, follows with Psych Evelene Croon).  Cognitive Testing Six-Item Cognitive Screener   "I would like to ask you some questions that ask you to use your memory. I am going to name three objects. Please wait until I say all three words, then repeat them. Remember what they are  because I am going to ask you to name them again in a few minutes. Please repeat these words for me: APPLE--TABLE--PENNY." (Interviewer may repeat names 3 times if necessary but repetition not scored.)  Did patient correctly repeat all three words? Yes - may proceed with screen  What year is this? Correct What month is this? Correct What day of the week is this? Correct  What were the three objects I asked you to remember? . Apple Correct . Table Correct . Penny Correct  Score one point for each incorrect answer.  A score of 2 or more points warrants additional investigation.  Patient's score 0   Assessment and Plan:     Congratulated patient on surpassing 5% weight loss goal she made at last AWV on 02/04/2019! She has made another 10 lb weight loss goal today. Continues to work with Lupita Leash P.  She has made a goal to sit outside 1 day per week for 10 minutes, and another goal to look into fostering a pet.  Patient is in need of cataract surgery but Ophthalmologist requires someone to stay with her the night following sx and she does not have anyone who can do this  She is scheduled to have a mammogram on 08/23/2020.  We scheduled her 3 month f/u with PCP for 09/27/2020  During the course of the visit the patient was educated and counseled about appropriate screening and preventive services as documented in the assessment and plan.  The printed AVS was given to the patient and included an updated screening schedule, a list of risk factors, and  personalized health advice.        Fredderick Severance, RN  07/19/2020

## 2020-07-19 NOTE — Progress Notes (Signed)
I discussed the AWV findings with the RN who conducted the visit. I was present in the office suite and immediately available to provide assistance and direction throughout the time the service was provided.   

## 2020-07-23 DIAGNOSIS — F331 Major depressive disorder, recurrent, moderate: Secondary | ICD-10-CM | POA: Diagnosis not present

## 2020-07-26 ENCOUNTER — Other Ambulatory Visit: Payer: Self-pay

## 2020-07-26 MED ORDER — RIVAROXABAN 20 MG PO TABS: ORAL_TABLET | ORAL | 3 refills | Status: DC

## 2020-07-26 NOTE — Progress Notes (Signed)
Internal Medicine Clinic Attending  Case discussed with Dr. Jinwala at the time of the visit.  We reviewed the AWV findings.  I agree with the assessment, diagnosis, and plan of care documented in the AWV note.      

## 2020-07-26 NOTE — Telephone Encounter (Signed)
Please call pt back for  XARELTO 20 MG TABS tablet.

## 2020-07-26 NOTE — Telephone Encounter (Signed)
Returned call to patient. States she spoke with [facility] today and was told the Rx for Xarelto had been cancelled by Prescriber. No note regarding this in Epic. Will forward refill request to PCP.

## 2020-07-26 NOTE — Telephone Encounter (Signed)
Return pt's call; no answer - left message to call the office. 

## 2020-07-30 DIAGNOSIS — F331 Major depressive disorder, recurrent, moderate: Secondary | ICD-10-CM | POA: Diagnosis not present

## 2020-08-06 DIAGNOSIS — F331 Major depressive disorder, recurrent, moderate: Secondary | ICD-10-CM | POA: Diagnosis not present

## 2020-08-16 ENCOUNTER — Encounter: Payer: Self-pay | Admitting: Dietician

## 2020-08-16 ENCOUNTER — Ambulatory Visit (INDEPENDENT_AMBULATORY_CARE_PROVIDER_SITE_OTHER): Payer: Medicare HMO | Admitting: Dietician

## 2020-08-16 DIAGNOSIS — J9611 Chronic respiratory failure with hypoxia: Secondary | ICD-10-CM | POA: Diagnosis not present

## 2020-08-16 DIAGNOSIS — R0603 Acute respiratory distress: Secondary | ICD-10-CM | POA: Diagnosis not present

## 2020-08-16 DIAGNOSIS — R0902 Hypoxemia: Secondary | ICD-10-CM | POA: Diagnosis not present

## 2020-08-16 DIAGNOSIS — R0682 Tachypnea, not elsewhere classified: Secondary | ICD-10-CM | POA: Diagnosis not present

## 2020-08-16 DIAGNOSIS — N1831 Chronic kidney disease, stage 3a: Secondary | ICD-10-CM | POA: Diagnosis not present

## 2020-08-16 DIAGNOSIS — G4733 Obstructive sleep apnea (adult) (pediatric): Secondary | ICD-10-CM | POA: Diagnosis not present

## 2020-08-16 DIAGNOSIS — Z86718 Personal history of other venous thrombosis and embolism: Secondary | ICD-10-CM | POA: Diagnosis not present

## 2020-08-16 DIAGNOSIS — R0602 Shortness of breath: Secondary | ICD-10-CM | POA: Diagnosis not present

## 2020-08-16 DIAGNOSIS — I272 Pulmonary hypertension, unspecified: Secondary | ICD-10-CM | POA: Diagnosis not present

## 2020-08-16 NOTE — Progress Notes (Signed)
Medical Nutrition Therapy:   Appt start time: 1325 end time:  1410 Total time:45 minutes Visit # 16  Goals- weight loss and maintenance with improved physical and mental health. "Self care" Lowest weight has been 330# in the past so that is currently her stated goal.  Expected outcomes/Reasons for goals- improve and maintain her independence.   Assessment: Ms. Polich presents today for nutritional counseling follow up for weight loss and Chronic Kidney disease.  She asked about a blood pressure cuff to monitor her blood pressure because she is concerned that it may be going up now that she is not on medication. She also reports that her O2 sats go down to 82%, but with rest it returns to high 90s.  She continues to log her food intake in Myfitness pal, works with her psychiatrist and counselor and peer support group  for her mental health. She is working on her goals of: moving more, recognizing emotional eating, logging her food, fitting in balanced meals with more fruits and vegetables, whole grains,. Nuts and seeds. She is beginning to do more self care physical activity such as laundry and washing her kitchen floor. Today we discussed: her current goals( logging foods, working on more fruits and vegetables and fiber, getting outside daily, moving more) benefits and negatives of behavior changes.   ANTHROPOMETRICS: Estimated body mass index is 50.62 kg/m as calculated from the following:   Height as of 06/28/20: 5\' 8"  (1.727 m).   Weight as of this encounter: 332 lb 14.4 oz (151 kg).   Wt Readings from Last 10 Encounters:  08/16/20 (!) 332 lb 14.4 oz (151 kg)  07/05/20 (!) 335 lb 3.2 oz (152 kg)  06/28/20 (!) 337 lb 8 oz (153.1 kg)  05/03/20 (!) 339 lb 6.4 oz (154 kg)  04/19/20 (!) 340 lb 8 oz (154.4 kg)  04/02/20 (!) 346 lb 12.8 oz (157.3 kg)  03/15/20 (!) 349 lb 1.6 oz (158.4 kg)  02/02/20 (!) 354 lb 9.6 oz (160.8 kg)  01/05/20 (!) 355 lb 1.6 oz (161.1 kg)  12/22/19 (!) 351 lb 4.8 oz  (159.3 kg)  Was 385.6# at the start and has achieved a 53# weight loss total and is maintaining that loss. Her stated goal is 330#.    DIETARY INTAKE:  Typically gets up at 11AM and just has 2 meals a day + a snack or a protein shake for her third meal.  Average intake for last 6 days:  Calories 1943/day Protein:90 g/day Sodium:2008 grams/day Fiber: 12.6 grams/day Takes flaxseed and psyllium and keeps track of her sodium intake   Usual physical activity: According to her records in MyFitnessPal she had 10-25 min of physical activity in the past week.  Progress Towards Goal(s):  Some progress.   Nutritional Diagnosis:  NB-1.5 Disordered eating pattern As related to eating one meal a day and binging at other times is improving gradually and has reoccured As evidenced by her report and food records.    Intervention:  Nutrition counseling using motivational interviewing to assist her with evaluating her progress and goals.  Continue attending support group and using MyFitnessPal  - Continue taking flaxseed( ground)  or psyllium  - Continue to work on identifying barriers to moving more.  Action Goal:   Outcome goal: Gain confidence and continue progress towards consistent meal and activity tracking, long term weight loss maintenance to reach/maintian her desired quality of life  Coordination of care: communicated O2 sat question to her doctor to her doctor  Teaching Method Utilized: visual and  Auditory Handouts given during visit include:  After visit summary; Benefits of exercise Barriers to learning/adherence to lifestyle change: depression and life stressors Demonstrated degree of understanding via:  Verbalization   Monitoring/Evaluation:  Dietary intake and body weight 8 weeks Norm Parcel, RD 08/16/2020 3:15 PM.

## 2020-08-16 NOTE — Patient Instructions (Addendum)
Can Google " O2 sats supposed to drop with activity.  Our next appointment is August 11 at 1:15 PM.  Consider Googling potassium/sodium/ Blood pressure to find out why you body is now abel to control your blood pressure.   Good seeing you!  Lupita Leash [phone]

## 2020-08-18 ENCOUNTER — Encounter (INDEPENDENT_AMBULATORY_CARE_PROVIDER_SITE_OTHER): Payer: Self-pay

## 2020-08-23 ENCOUNTER — Ambulatory Visit
Admission: RE | Admit: 2020-08-23 | Discharge: 2020-08-23 | Disposition: A | Discharge status: Home / Self Care | Payer: Medicare HMO | Source: Ambulatory Visit | Attending: Internal Medicine | Admitting: Internal Medicine

## 2020-08-23 ENCOUNTER — Other Ambulatory Visit: Payer: Self-pay

## 2020-08-23 DIAGNOSIS — Z0000 Encounter for general adult medical examination without abnormal findings: Secondary | ICD-10-CM

## 2020-08-23 DIAGNOSIS — Z1231 Encounter for screening mammogram for malignant neoplasm of breast: Secondary | ICD-10-CM | POA: Diagnosis not present

## 2020-09-04 ENCOUNTER — Encounter: Payer: Self-pay | Admitting: *Deleted

## 2020-09-04 DIAGNOSIS — F331 Major depressive disorder, recurrent, moderate: Secondary | ICD-10-CM | POA: Diagnosis not present

## 2020-09-05 DIAGNOSIS — H25813 Combined forms of age-related cataract, bilateral: Secondary | ICD-10-CM | POA: Diagnosis not present

## 2020-09-05 DIAGNOSIS — H43811 Vitreous degeneration, right eye: Secondary | ICD-10-CM | POA: Diagnosis not present

## 2020-09-19 DIAGNOSIS — F331 Major depressive disorder, recurrent, moderate: Secondary | ICD-10-CM | POA: Diagnosis not present

## 2020-09-27 ENCOUNTER — Encounter: Payer: Self-pay | Admitting: Internal Medicine

## 2020-09-27 ENCOUNTER — Ambulatory Visit (INDEPENDENT_AMBULATORY_CARE_PROVIDER_SITE_OTHER): Payer: Medicare HMO | Admitting: Internal Medicine

## 2020-09-27 VITALS — BP 121/71 | HR 94 | Temp 98.5°F | Ht 68.0 in | Wt 327.1 lb

## 2020-09-27 DIAGNOSIS — M170 Bilateral primary osteoarthritis of knee: Secondary | ICD-10-CM

## 2020-09-27 DIAGNOSIS — R55 Syncope and collapse: Secondary | ICD-10-CM | POA: Diagnosis not present

## 2020-09-27 DIAGNOSIS — I10 Essential (primary) hypertension: Secondary | ICD-10-CM

## 2020-09-27 DIAGNOSIS — N1831 Chronic kidney disease, stage 3a: Secondary | ICD-10-CM

## 2020-09-27 DIAGNOSIS — Z0000 Encounter for general adult medical examination without abnormal findings: Secondary | ICD-10-CM

## 2020-09-27 DIAGNOSIS — J432 Centrilobular emphysema: Secondary | ICD-10-CM

## 2020-09-27 DIAGNOSIS — I129 Hypertensive chronic kidney disease with stage 1 through stage 4 chronic kidney disease, or unspecified chronic kidney disease: Secondary | ICD-10-CM

## 2020-09-27 DIAGNOSIS — N3946 Mixed incontinence: Secondary | ICD-10-CM

## 2020-09-27 DIAGNOSIS — E042 Nontoxic multinodular goiter: Secondary | ICD-10-CM

## 2020-09-27 DIAGNOSIS — G4733 Obstructive sleep apnea (adult) (pediatric): Secondary | ICD-10-CM | POA: Diagnosis not present

## 2020-09-27 DIAGNOSIS — F322 Major depressive disorder, single episode, severe without psychotic features: Secondary | ICD-10-CM

## 2020-09-27 DIAGNOSIS — E66813 Obesity, class 3: Secondary | ICD-10-CM

## 2020-09-27 DIAGNOSIS — Z6841 Body Mass Index (BMI) 40.0 and over, adult: Secondary | ICD-10-CM

## 2020-09-27 NOTE — Assessment & Plan Note (Signed)
No subsequent events or orthostatic symptoms.  Will resolve to history.

## 2020-09-27 NOTE — Assessment & Plan Note (Signed)
Symptomatic if she has been up for a period of time, such as when she attends her classes. She is tolerating it.  Not interested in injections at this time. Monitor.

## 2020-09-27 NOTE — Assessment & Plan Note (Signed)
Symptoms are improving significantly!  She has been going to classes through [employer] twice a week on grief ("Im dealing with some things I didn't want to face") which gets her out of the house.  She is feeling physically better with weight loss, which also positively influences her mood.  Good signs! No changes have been made in her medicines lately.  Sees her psychiatrist twice a year.

## 2020-09-27 NOTE — Progress Notes (Signed)
Feels good! Walked all the way from car! Didn't have to stop to breath!   Feels more energetic. Has lost 10# since last visit 84M ago. "I'm proud of myself!"  "I'm talking two classes now with [facility], about Grief and Loss on Wednesdays and THursdays, getting out of house, dealing with some issues." Fitness Pal monitoring online with RD Lupita Leash.  Extraction of cataracts with Dr. Dione Booze 11/2020.  Sees psychiatry twice a year, no change in medicines.  Emotionally feeling stronger.  BP 121/71 (BP Location: Right Arm, Patient Position: Sitting, Cuff Size: Normal)   Pulse 94   Temp 98.5 F (36.9 C) (Oral)   Ht 5\' 8"  (1.727 m)   Wt (!) 327 lb 1.6 oz (148.4 kg)   SpO2 100%   BMI 49.74 kg/m   Bright affect and conversant, looks great!  Quite different from some of our earlier visits.  Heart RRR, no murmur.  No carotid bruits or JVD.  Fullness of L thyroid lobe palpable, chronic.  No LE edema.  DP pulses 1+ symmetric.  No foot lesions.  Skin turgor is nml.    Assessment and plan:  See also problem based documentation.  She is doing much much better, as she becomes more physically comfortable and emotionally encouraged by her slow/steady/safe/intentional weight loss.  So good to see!  She is thinking more about getting a pet. "I'm almost there, I can see it happening".

## 2020-09-27 NOTE — Patient Instructions (Signed)
Ms. Sargent, you're doing great!  I'm so pleased that you're seeing some improvement in your health and function!  Your blood pressure is great without any blood pressure medicines.  Congratulations!  We will recheck kidney function today and I'll call with results when they return next week.  Take care, stay well, keep drinking water and getting out of the house!  When I see you next time, you will have had your thyroid ultrasound completed.  You will be due for a flu shot.  Dr. Mayford Knife

## 2020-09-27 NOTE — Assessment & Plan Note (Signed)
10# weight loss over last 3 months.  She is feeling the results, has improved mood and motivation, sees the benefit in her health (no longer on antihypertensives) and is motivated to continue.  Working with RD using an app 'fitness pal'.

## 2020-09-27 NOTE — Assessment & Plan Note (Signed)
Excellent blood pressure without medication!  This problem seems to be resolving with lifestyle changes (diet) and weight loss.  Continue to monitor.

## 2020-09-27 NOTE — Assessment & Plan Note (Signed)
BMP ordered for f/u.  She has been increasing her water intake and has normal skin turgor today. On no antihypertensives.

## 2020-09-27 NOTE — Assessment & Plan Note (Signed)
Improved mood and motivation, she now agrees to the f/u US.  Ordered.

## 2020-09-27 NOTE — Assessment & Plan Note (Signed)
Mammogram completed summer 2022.  Will update caregaps to include flex sig as ca screening in 2020.

## 2020-09-27 NOTE — Assessment & Plan Note (Signed)
Normal airflow. No cough.  Able to walk distance from her care down the long hall to our clinic without stopping for breath.  This is a significant improvement (weight loss helps). Monitor.

## 2020-09-27 NOTE — Assessment & Plan Note (Signed)
Wears CPAP nightly 

## 2020-09-27 NOTE — Assessment & Plan Note (Signed)
Stopped Myrbetriq but renewed medicine sent to the house.  Must be on autorefill.  Resumed a couple weeks ago, want to continue at least for a month.  If no big difference she'll stop.

## 2020-09-28 LAB — BMP8+ANION GAP
Anion Gap: 20 mmol/L — ABNORMAL HIGH (ref 10.0–18.0)
BUN/Creatinine Ratio: 14 (ref 12–28)
BUN: 15 mg/dL (ref 8–27)
CO2: 18 mmol/L — ABNORMAL LOW (ref 20–29)
Calcium: 9.4 mg/dL (ref 8.7–10.3)
Chloride: 101 mmol/L (ref 96–106)
Creatinine, Ser: 1.05 mg/dL — ABNORMAL HIGH (ref 0.57–1.00)
Glucose: 105 mg/dL — ABNORMAL HIGH (ref 65–99)
Potassium: 4.7 mmol/L (ref 3.5–5.2)
Sodium: 139 mmol/L (ref 134–144)
eGFR: 56 mL/min/{1.73_m2} — ABNORMAL LOW (ref >59)

## 2020-10-01 ENCOUNTER — Telehealth: Payer: Self-pay | Admitting: Internal Medicine

## 2020-10-01 NOTE — Telephone Encounter (Signed)
Called to report her blood test results form last week - ongoing improvement in kidney function!  She is pleased.

## 2020-10-11 ENCOUNTER — Encounter: Payer: Self-pay | Admitting: Dietician

## 2020-10-11 ENCOUNTER — Ambulatory Visit (INDEPENDENT_AMBULATORY_CARE_PROVIDER_SITE_OTHER): Payer: Medicare HMO | Admitting: Dietician

## 2020-10-11 DIAGNOSIS — N1831 Chronic kidney disease, stage 3a: Secondary | ICD-10-CM | POA: Diagnosis not present

## 2020-10-11 NOTE — Progress Notes (Signed)
Medical Nutrition Therapy:   Appt start time: 1330 end time: 1408   Total time:38 minutes Visit # 16  Goals- she met her goal of weighing < 330# and maintenance with improved physical and mental health. New goal established today of < 299 #.   Expected outcomes/Reasons for goals- improve and maintain her independence. "Self care"  Assessment: Ms. Krasinski presents today for nutritional counseling follow up for weight loss and Chronic Kidney disease.  She asked about a blood pressure cuff to monitor her blood pressure because she is concerned that she gets dizzy with movement at times. She is beginning to do more self care activities Angela Cox as taking two classes on dealing with emotions and grief and loss. This gets her out of the house, moving more, getting sunshine and interacting with others. She is not ready to give up or change her emotional eating as yet. However, she says she does recognize it and feels that it is a way of rewarding herself as long as she makes up for it the next day. She expressed that she is either achieving or in the process of working on her her current goals (logging foods, working on more fruits and vegetables and fiber, getting outside daily, moving more) benefits and negatives of behavior changes.  Her renal function continues to improve with weight loss and dietary changes. Her blood pressure is remaining well controlled off all medications.   ANTHROPOMETRICS: Estimated body mass index is 49.89 kg/m as calculated from the following:   Height as of 09/27/20: '5\' 8"'  (1.727 m).   Weight as of this encounter: 328 lb 1.6 oz (148.8 kg).   Wt Readings from Last 10 Encounters:  10/11/20 (!) 328 lb 1.6 oz (148.8 kg)  09/27/20 (!) 327 lb 1.6 oz (148.4 kg)  08/16/20 (!) 332 lb 14.4 oz (151 kg)  07/05/20 (!) 335 lb 3.2 oz (152 kg)  06/28/20 (!) 337 lb 8 oz (153.1 kg)  05/03/20 (!) 339 lb 6.4 oz (154 kg)  04/19/20 (!) 340 lb 8 oz (154.4 kg)  04/02/20 (!) 346 lb 12.8 oz (157.3  kg)  03/15/20 (!) 349 lb 1.6 oz (158.4 kg)  02/02/20 (!) 354 lb 9.6 oz (160.8 kg)  Was 385.6# at the start and has achieved a 57.5# weight loss total and is maintaining that loss.   Lab Results  Component Value Date   CREATININE 1.05 (H) 09/27/2020   CREATININE 1.20 (H) 04/19/2020   CREATININE 1.73 (H) 03/15/2020   Usual physical activity:increasing with self care activities and is working on getting out of bed using small steps and her WRAP class  Progress Towards Goal(s):  Some progress.   Nutritional Diagnosis:  NB-1.5 Disordered eating pattern As related to eating one meal a day and binging at other times is improving gradually and has reoccured As evidenced by her report and food records.    Intervention:  Nutrition counseling using motivational interviewing to assist her with evaluating her progress and goals.  Continue attending support group and using MyFitnessPal  - Continue taking flaxseed( ground)  or psyllium  - Continue to work on identifying barriers to moving more.  Action Goal:   Outcome goal: Gain confidence and continue progress towards consistent meal and activity tracking, long term weight loss maintenance to reach/maintian her desired quality of life  Coordination of care: none needed today Teaching Method Utilized: visual and  Auditory Handouts given during visit include:  After visit summary; Benefits of exercise Barriers to learning/adherence to lifestyle  change: depression and life stressors Demonstrated degree of understanding via:  Verbalization   Monitoring/Evaluation:  Dietary intake and body weight 6-8 weeks Debera Lat, RD 10/11/2020 4:50 PM.

## 2020-10-11 NOTE — Patient Instructions (Addendum)
Congrats on reaching your goal!!!  I suggest looking up the work indulgent....  Ideas to spice up food-   Peppers, black pepper, garlic, salsa, salt free seasonings- like lemon pepper  Plan to get to 299# : Eat healthy like I am Move more  You found a reason to move more!! Wonderful!!!!   Something to think about: Where is your confidence in maintianing and continuing your new normal lifestyle changes?  Think of a non food reward..... buy myself a plant!  Lupita Leash [phone]

## 2020-10-25 ENCOUNTER — Encounter: Payer: Medicare HMO | Admitting: Dietician

## 2020-11-08 DIAGNOSIS — H25812 Combined forms of age-related cataract, left eye: Secondary | ICD-10-CM | POA: Diagnosis not present

## 2020-11-08 DIAGNOSIS — H268 Other specified cataract: Secondary | ICD-10-CM | POA: Diagnosis not present

## 2020-11-18 DIAGNOSIS — H2511 Age-related nuclear cataract, right eye: Secondary | ICD-10-CM | POA: Diagnosis not present

## 2020-11-22 DIAGNOSIS — H268 Other specified cataract: Secondary | ICD-10-CM | POA: Diagnosis not present

## 2020-11-22 DIAGNOSIS — H25811 Combined forms of age-related cataract, right eye: Secondary | ICD-10-CM | POA: Diagnosis not present

## 2021-01-09 ENCOUNTER — Telehealth: Payer: Self-pay | Admitting: *Deleted

## 2021-01-09 ENCOUNTER — Other Ambulatory Visit: Payer: Self-pay | Admitting: Dietician

## 2021-01-09 DIAGNOSIS — F322 Major depressive disorder, single episode, severe without psychotic features: Secondary | ICD-10-CM

## 2021-01-09 NOTE — Progress Notes (Signed)
Referral to [facility] social work , Engineer, materials for housing needs.

## 2021-01-09 NOTE — Telephone Encounter (Signed)
Front office received a call from the pt stating she's being evicted from her home and needs to speak to the SW. [name] stated she needs a referral order place first. I will ask pt's PCP Dr Mayford Knife to place an order. Thanks

## 2021-01-10 ENCOUNTER — Telehealth: Payer: Self-pay | Admitting: *Deleted

## 2021-01-10 NOTE — Chronic Care Management (AMB) (Signed)
  Care Management   Outreach Note  01/10/2021 Name: [name] MRN: [id number] DOB: 08-Aug-1945  Referred by: Miguel Aschoff, MD Reason for referral : Care Coordination (Initial outreach to schedule referral with BSW )   An unsuccessful telephone outreach was attempted today. The patient was referred to the case management team for assistance with care management and care coordination.   Follow Up Plan:  A HIPAA compliant phone message was left for the patient providing contact information and requesting a return call.  The care management team will reach out to the patient again over the next 7 days.  If patient returns call to provider office, please advise to call Embedded Care Management Care Guide Misty Stanley at [phone].  Gwenevere Ghazi  Care Guide, Embedded Care Coordination [facility] Management  Direct Dial: [phone]

## 2021-01-10 NOTE — Chronic Care Management (AMB) (Signed)
  Care Management   Note  01/10/2021 Name: Graciella Arment MRN: [id number] DOB: 12/06/1945  Tianna Alecia Doi is a 75 y.o. year old female who is a primary care patient of Miguel Aschoff, MD. I reached out to [name] by phone today in response to a referral sent by Ms. Dalilah Louise Constancio's primary care provider.   Ms. Tancredi was given information about care management services today including:  Care management services include personalized support from designated clinical staff supervised by her physician, including individualized plan of care and coordination with other care providers 24/7 contact phone numbers for assistance for urgent and routine care needs. The patient may stop care management services at any time by phone call to the office staff.  Patient agreed to services and verbal consent obtained.   Follow up plan: Telephone appointment with care management team member scheduled for:01/14/21  [facility] Guide, Embedded Care Coordination [facility]  Care Management  Direct Dial: [phone]

## 2021-01-14 ENCOUNTER — Ambulatory Visit: Payer: Medicare HMO | Admitting: Licensed Clinical Social Worker

## 2021-01-14 NOTE — Chronic Care Management (AMB) (Signed)
  Care Management   Social Work Visit Note  01/14/2021 Name: [name] MRN: [id number] DOB: 03-Jul-1945  [name] is a 75 y.o. year old female who sees Mayford Knife, Dorene Ar, MD for primary care. The care management team was consulted for assistance with care management and care coordination needs related to [facility] Resources    Patient was given the following information about care management and care coordination services today, agreed to services, and gave verbal consent: 1.care management/care coordination services include personalized support from designated clinical staff supervised by their physician, including individualized plan of care and coordination with other care providers 2. 24/7 contact phone numbers for assistance for urgent and routine care needs. 3. The patient may stop care management/care coordination services at any time by phone call to the office staff.  Engaged with patient by telephone for initial visit in response to provider referral for social work chronic care management and care coordination services.  Assessment: Review of patient history, allergies, and health status during evaluation of patient need for care management/care coordination services.    Interventions:  Patient interviewed and appropriate assessments performed Collaborated with clinical team regarding patient needs  Patient advised she is being evicted due reasons beyond her control. Patient is requesting a letter of accomodation from Dr. Mayford Knife to present to court. SW advised she placed forms in Dr. Mayford Knife box and notified provider.  Patient stated she has mental health concerns and becoming homeless is triggering and adding stress. Patient stated she is meeting with Dr. Monna Fam.   SDOH (Social Determinants of Health) assessments performed: Yes     Plan:  patient will work with BSW to address needs related to Housing barriers SW will follow up within  30-days.  Christen Butter, BSW  Social Worker [facility] Care Management  [phone]

## 2021-01-14 NOTE — Patient Instructions (Signed)
Visit Information  Instructions: patient will work with SW to address concerns related to housing.  Patient was given the following information about care management and care coordination services today, agreed to services, and gave verbal consent: 1.care management/care coordination services include personalized support from designated clinical staff supervised by their physician, including individualized plan of care and coordination with other care providers 2. 24/7 contact phone numbers for assistance for urgent and routine care needs. 3. The patient may stop care management/care coordination services at any time by phone call to the office staff.  Patient verbalizes understanding of instructions provided today and agrees to view in MyChart.   The care management team will reach out to the patient again over the next 30 days.  Rakhi Romagnoli, BSW  Social Worker [facility] Care Management  [phone]      

## 2021-01-22 ENCOUNTER — Encounter: Payer: Self-pay | Admitting: Internal Medicine

## 2021-01-22 NOTE — Progress Notes (Signed)
Letter created for appeal of housing eviction

## 2021-01-28 ENCOUNTER — Telehealth: Payer: Self-pay | Admitting: Dietician

## 2021-01-28 NOTE — Telephone Encounter (Signed)
Left voicemail for return call  

## 2021-02-12 NOTE — Progress Notes (Signed)
Letter was printed, signed, and made available to be picked up by patient per her request.

## 2021-02-14 ENCOUNTER — Telehealth: Payer: Self-pay | Admitting: Licensed Clinical Social Worker

## 2021-02-14 ENCOUNTER — Telehealth: Payer: Medicare HMO | Admitting: Licensed Clinical Social Worker

## 2021-02-14 NOTE — Telephone Encounter (Signed)
°  Care Management   Follow Up Note   02/14/2021 Name: [name] MRN: [id number] DOB: 1945-10-22   Referred by: Miguel Aschoff, MD Reason for referral : No chief complaint on file.   An unsuccessful telephone outreach was attempted today. The patient was referred to the case management team for assistance with care management and care coordination.   Follow Up Plan: The patient has been provided with contact information for the care management team and has been advised to call with any health related questions or concerns.   Christen Butter, BSW  Social Worker [facility] Management  [phone]

## 2021-04-03 ENCOUNTER — Encounter: Payer: Medicare HMO | Admitting: Dietician

## 2021-04-26 NOTE — Progress Notes (Signed)
X °

## 2021-05-13 ENCOUNTER — Encounter: Payer: Self-pay | Admitting: Internal Medicine

## 2021-06-04 ENCOUNTER — Ambulatory Visit (INDEPENDENT_AMBULATORY_CARE_PROVIDER_SITE_OTHER): Payer: Medicare HMO | Admitting: Internal Medicine

## 2021-06-04 ENCOUNTER — Ambulatory Visit (HOSPITAL_COMMUNITY)
Admission: RE | Admit: 2021-06-04 | Discharge: 2021-06-04 | Disposition: A | Discharge status: Home / Self Care | Payer: Medicare HMO | Source: Ambulatory Visit | Attending: Internal Medicine | Admitting: Internal Medicine

## 2021-06-04 ENCOUNTER — Ambulatory Visit (INDEPENDENT_AMBULATORY_CARE_PROVIDER_SITE_OTHER): Payer: Medicare HMO | Admitting: Dietician

## 2021-06-04 ENCOUNTER — Encounter: Payer: Self-pay | Admitting: Internal Medicine

## 2021-06-04 VITALS — BP 125/70 | HR 81 | Temp 98.7°F | Ht 68.0 in

## 2021-06-04 DIAGNOSIS — N1831 Chronic kidney disease, stage 3a: Secondary | ICD-10-CM

## 2021-06-04 DIAGNOSIS — M25512 Pain in left shoulder: Secondary | ICD-10-CM | POA: Insufficient documentation

## 2021-06-04 MED ORDER — DICLOFENAC SODIUM 1 % EX GEL: 4.0000 g | Freq: Four times a day (QID) | CUTANEOUS | 1 refills | Status: DC

## 2021-06-04 NOTE — Patient Instructions (Signed)
Ms [name], ? ?It was a pleasure seeing you in clinic. Today we discussed:  ? ?Left shoulder pain: ?We are getting an X-ray to evaluate this further. I will call you with the results. Meanwhile, you may use Voltaren gel up to 4 times daily as needed for pain.  ? ?If you have any questions or concerns, please call our clinic at [phone] between 9am-5pm and after hours call [phone] and ask for the internal medicine resident on call. If you feel you are having a medical emergency please call 911.  ? ?Thank you, we look forward to helping you remain healthy! ? ? ?

## 2021-06-04 NOTE — Patient Instructions (Addendum)
Your Total Daily Energy Expenditure (TDEE): to maintain your weight is  2,400 calories/day using activity level of little to no exercise.  ? ?You need to eat less than this such as 2000- 2200 calories per day for weight loss ?Basal Metabolic Rate (BMR) 2,000 calories per day  for if you had absolute no activity at all.  ? ?Wt Readings from Last 10 Encounters:  ?06/04/21 (!) 323 lb 12.8 oz (146.9 kg)  ?10/11/20 (!) 328 lb 1.6 oz (148.8 kg)  ?09/27/20 (!) 327 lb 1.6 oz (148.4 kg)  ?08/16/20 (!) 332 lb 14.4 oz (151 kg)  ?07/05/20 (!) 335 lb 3.2 oz (152 kg)  ?06/28/20 (!) 337 lb 8 oz (153.1 kg)  ?05/03/20 (!) 339 lb 6.4 oz (154 kg)  ?04/19/20 (!) 340 lb 8 oz (154.4 kg)  ?04/02/20 (!) 346 lb 12.8 oz (157.3 kg)  ?03/15/20 (!) 349 lb 1.6 oz (158.4 kg)  ? ?Lupita Leash ?[phone] ? ? ?

## 2021-06-04 NOTE — Progress Notes (Signed)
Medical Nutrition Therapy:   ?Appt start time: 1515 end time: 1545 ?Total time:30 minutes ?Visit # 17 ? ?Goals- New goal of increasing her activity and weight < 299 #.   ?Expected outcomes/Reasons for goals- improve and maintain her independence. "Self care" ? ?Assessment: [name] presents today for nutritional counseling follow up for weight loss and Chronic Kidney disease.  She continues logging her food intake using Myfitnesspal. She states that she still has episodes of emotional eating but less lately . She expressed that she is still working more moving more and has little to no activity other than when she shops or has appointments.  ?She has labs from a study that show that her renal function continues to improve with weight loss and dietary changes. Her blood pressure is remaining well controlled off all medications. Her insulin levels have decreased as well as her glucose and triglycerides.  ? ?ANTHROPOMETRICS: Estimated body mass index is 49.23 kg/m? as calculated from the following: ?  Height as of 09/27/20: 5\' 8"  (1.727 m). ?  Weight as of this encounter: 323 lb 12.8 oz (146.9 kg).  ? ?Wt Readings from Last 10 Encounters:  ?06/04/21 (!) 323 lb 12.8 oz (146.9 kg)  ?10/11/20 (!) 328 lb 1.6 oz (148.8 kg)  ?09/27/20 (!) 327 lb 1.6 oz (148.4 kg)  ?08/16/20 (!) 332 lb 14.4 oz (151 kg)  ?07/05/20 (!) 335 lb 3.2 oz (152 kg)  ?06/28/20 (!) 337 lb 8 oz (153.1 kg)  ?05/03/20 (!) 339 lb 6.4 oz (154 kg)  ?04/19/20 (!) 340 lb 8 oz (154.4 kg)  ?04/02/20 (!) 346 lb 12.8 oz (157.3 kg)  ?03/15/20 (!) 349 lb 1.6 oz (158.4 kg)  ?Was 385.6# (02/2019) at the start and has achieved a 61.8# weight loss total and is maintaining that loss.  ? ?Lab Results  ?Component Value Date  ? CREATININE 1.05 (H) 09/27/2020  ? CREATININE 1.20 (H) 04/19/2020  ? CREATININE 1.73 (H) 03/15/2020  ? ?Usual physical activity: is working on getting out of bed  ? ?Progress Towards Goal(s):  Some progress. ?  ?Nutritional Diagnosis:  ?NB-1.5  Disordered eating pattern As related to eating one meal a day and binging at other times is improving gradually and has reoccured As evidenced by her report and food records. ?   ?Intervention:  Nutrition counseling for chronic kidney disease. Reviewed recent labs with patient.   ?Action Goal: Continue attending support group and using MyFitnessPal  ?- Continue to work on identifying barriers to moving more.  ? Outcome goal: Gain confidence and continue progress towards consistent meal and activity tracking, long term weight loss maintenance to reach/maintian her desired quality of life  ?Coordination of care: none needed today ?Teaching Method Utilized: visual and  Auditory ?Handouts given during visit include:  After visit summary; information about the PREP class ?Barriers to learning/adherence to lifestyle change: depression and life stressors ?Demonstrated degree of understanding via:  Verbalization  ? ?Monitoring/Evaluation:  Dietary intake and body weight 6-8 weeks ?03/17/2020, RD ?06/04/2021 ?4:00 PM. ? ? ? ? ?

## 2021-06-05 ENCOUNTER — Telehealth: Payer: Self-pay

## 2021-06-05 DIAGNOSIS — M25512 Pain in left shoulder: Secondary | ICD-10-CM | POA: Insufficient documentation

## 2021-06-05 NOTE — Assessment & Plan Note (Signed)
Patient is presenting for evaluation of left shoulder pain for one month duration. She reports a localized sharp pain limited to the posterior left shoulder that initially started one month prior but has persisted since and now she is unable to lift her arm due to pain. She has tried IcyHot to the area which provided some relief. She denies any recent trauma. ?On examination, no obvious deformity noted. She does have TTP over the [facility] joint with limited active and passive ROM in flexion, abduction, and external rotation due to pain.  ?Prior imaging in 2008 of same shoulder with evidence of osteoarthritis in [facility] and glenohumeral joints. Suspect that current symptoms may be in setting of osteoarthritis vs tendonitis. However, given her severe limitation in ROM, could also have adhesive capsulitis vs glenohumeral joint pathology ? ?Plan: ?Xray left shoulder ?Voltaren gel qid prn  ?

## 2021-06-05 NOTE — Progress Notes (Signed)
? ?  CC: left shoulder pain ? ?HPI: ? ?[name] is a 76 y.o. female with PMHx as stated below presenting for one month of left shoulder pain. Please see problem based charting for complete assessment and plan. ? ?Past Medical History:  ?Diagnosis Date  ? Arthritis   ? osteoarthritis , kness.  ? Chronic hypoxemic respiratory failure ([place]) 11/18/2013  ? 8/26 CTA >>> extensive b/l PE's with evidence of right heart strain c/w at least submassive PE.  8/26 Echo >>> EF [id number], grade 1 diastolic dysfx, mod/severe RV dilation with mod RV systolic dysfx, PAS 51 mmHg  8/27 LE Dopplers >>>negative 01/2014 Echo> EF 60%, RV "upper limits of normal", cannot estimate RVSP   ? Chronic kidney disease   ? Renal insuffucinecy, cr-1.33 in 10/2005.  ? Depression   ? followed by Dr. Toy Care  ? Diverticulosis of colon   ? Embolism and thrombosis of unspecified artery ([place]) 01/11/2020  ? Fibrocystic breast disease   ? History of hepatitis B   ? History of pulmonary embolism 01/2002  ? Hyperlipidemia   ? Hypertension   ? Morbid obesity with body mass index (BMI) of 50.0 to 59.9 in adult [facility]) 01/19/2006  ?     ? Neck nodule   ? not erythematous, movable like cyst located at the base of posterior neck on the left side(not painful), will need follow up for   ? Obesity   ? Obstructive sleep apnea   ? cpap   ? Pulmonary nodule   ? Stable on repeat imaging. LLL nodule 5 mm  ? Vasovagal near-syncope 06/28/2020  ? Acute episode around 10:30 am when preparing to leave today's visit.  Had been sitting in chair for 2 hours; minimal sleep night prior; last meal was popcorn and sips of water around 1 am.  "I feel dizzy" she noted while sitting - recheck BP not initially measurable, pulses faint, became cool and  diaphoretic and minimally verbally responsive though did not completely lose consciousness.  Denied d  ? ?Review of Systems:  Negative except as stated in HPI. ? ?Physical Exam: ? ?Vitals:  ? 06/04/21 1555  ?BP: 125/70  ?Pulse: 81   ?Temp: 98.7 ?F (37.1 ?C)  ?TempSrc: Oral  ?SpO2: 98%  ?Height: 5\' 8"  (1.727 m)  ? ?Physical Exam  ?Constitutional: elderly obese female,no acute distress  ?Cardiovascular: Normal rate, regular rhythm,  Distal pulses intact ?Respiratory: Effort is normal on room air ?Musculoskeletal: Normal bulk and tone.  No peripheral edema noted. ?L shoulder: No obvious deformity or asymmetry, bruising or swelling on inspection; palpation w/ TTP over AC joint. Limited active and passive ROM. Unable to flex or abductpast 45 degrees. NV intact distally. Unable to assess strength with resisted flexion or IR 2/2 pain. POCUS without obvious fluid collection or tear noted.  ?Neurological: Is alert and oriented x4, no apparent focal deficits noted. ?Skin: Warm and dry.  No rash, erythema, lesions noted. ?Psychiatric: Normal mood and affect. Behavior is normal. Judgment and thought content normal.  ? ? ?Assessment & Plan:  ? ?See Encounters Tab for problem based charting. ? ?Patient discussed with Dr. Philipp Ovens ? ?

## 2021-06-05 NOTE — Telephone Encounter (Signed)
Questions about medication. Please call pt back.  

## 2021-06-06 NOTE — Progress Notes (Signed)
Results discussed with patient. She expressed hesitation about using voltaren gel and taking her xarelto. Discussed that since voltaren is topical, it should not interfere with her xarelto. Encouraged to use this up 4x/day as needed. If no improvement, will refer to orthopedic surgery to consider shoulder injection

## 2021-06-11 ENCOUNTER — Other Ambulatory Visit: Payer: Self-pay | Admitting: Dietician

## 2021-06-11 DIAGNOSIS — N1831 Chronic kidney disease, stage 3a: Secondary | ICD-10-CM

## 2021-06-11 NOTE — Progress Notes (Signed)
Internal Medicine Clinic Attending ? ?Case discussed with Dr. Mcarthur Rossetti  At the time of the visit.  We reviewed the resident?s history and exam and pertinent patient test results.  I agree with the assessment, diagnosis, and plan of care documented in the resident?s note.  ? ?Xray with advanced glenohumeral arthritis. Consider intra articular steroid injection.   ?

## 2021-06-11 NOTE — Progress Notes (Signed)
Referral request

## 2021-06-17 ENCOUNTER — Ambulatory Visit
Admission: RE | Admit: 2021-06-17 | Discharge: 2021-06-17 | Disposition: A | Discharge status: Home / Self Care | Payer: Medicare HMO | Source: Ambulatory Visit | Attending: Internal Medicine | Admitting: Internal Medicine

## 2021-06-17 DIAGNOSIS — E042 Nontoxic multinodular goiter: Secondary | ICD-10-CM

## 2021-07-02 NOTE — Progress Notes (Signed)
Patient called.  Unable to reach patient but left non-specific mssage on vm about "reassuring results, will speak further at our upcoming visit at end of month."

## 2021-07-08 ENCOUNTER — Encounter: Payer: Self-pay | Admitting: Internal Medicine

## 2021-07-16 ENCOUNTER — Encounter: Payer: Medicare HMO | Admitting: Dietician

## 2021-07-16 ENCOUNTER — Ambulatory Visit (INDEPENDENT_AMBULATORY_CARE_PROVIDER_SITE_OTHER): Payer: Medicare HMO | Admitting: Dietician

## 2021-07-16 DIAGNOSIS — N1831 Chronic kidney disease, stage 3a: Secondary | ICD-10-CM | POA: Diagnosis not present

## 2021-07-16 NOTE — Progress Notes (Signed)
Medical Nutrition Therapy:   ?Appt start time: 1500 end time: [id number] ?Total time 45 minutes ?Visit # 18 ? ?Goals- New goal of increasing her activity and weight < 299 #.   ?Expected outcomes/Reasons for goals- improve and maintain her independence. "Self care" ? ?This is a telephone encounter between [place] and [place] ? on 07/16/2021 for Medical Nutrition Therapy for chronic Kidney disease. The visit was conducted with the patient located at home and Butch Penny Faraz Ponciano ? at [facility]. The patient's identity was confirmed using their DOB and current address. The patient has consented to being evaluated through a telephone encounter and understands the associated risks / benefits (allows the patient to remain at home). I personally spent 45 minutes on medical nutrition therapy discussion.  ? ?The following statements were read to the patient and/or legal guardian that are established with the Nutritional Health Provider. ?  ?"By engaging in this telephone visit, you consent to the provision of healthcare.  Additionally, you authorize for your insurance to be billed for the services provided during this telephone visit."  ?  ?Patient and/or legal guardian consented to telephone visit: yes ? ? ?Assessment: Ms. Ralph presents today for nutritional counseling follow up for Chronic Kidney disease and weight loss to assist with her kidney disease and overall health. Ms. Fabozzi state she is  having trouble with emotional eating due to the stress caused by her eviction case. She now has representation from legal aid.  She thinks the emotions from her eviction case has caused her to eat saltier foods that increase her fluid retention, to stop logging her food intake in Myfitnesspal and to regain some weight.  ? ?On a positive note, she bought a digital scale and is weighing herself daily (was 330# today) and finds this motivating. She feels her depression is doing okay and continues with a therapist and support  group as well as a psychiatrist. She has been more active emptying her storage shed out. In addition, she now has a in home care person 7 hours a week who helps her with cleaning, hygiene, shopping and physical activity.   ?She is interested in the PREP class but will call when ready for a referral.  ? ?ANTHROPOMETRICS: reported weight 330#  ? ?Wt Readings from Last 10 Encounters:  ?06/04/21 (!) 323 lb 12.8 oz (146.9 kg)  ?10/11/20 (!) 328 lb 1.6 oz (148.8 kg)  ?09/27/20 (!) 327 lb 1.6 oz (148.4 kg)  ?08/16/20 (!) 332 lb 14.4 oz (151 kg)  ?07/05/20 (!) 335 lb 3.2 oz (152 kg)  ?06/28/20 (!) 337 lb 8 oz (153.1 kg)  ?05/03/20 (!) 339 lb 6.4 oz (154 kg)  ?04/19/20 (!) 340 lb 8 oz (154.4 kg)  ?04/02/20 (!) 346 lb 12.8 oz (157.3 kg)  ?03/15/20 (!) 349 lb 1.6 oz (158.4 kg)  ?Was 385.6# (02/2019) at the start and has achieved a 61.8# weight loss total and is maintaining that loss.  ? ?CrCl cannot be calculated (Patient's most recent lab result is older than the maximum 21 days allowed.). ?  ? ?Progress Towards Goal(s):  Some progress. ?  ?Nutritional Diagnosis:  ?NB-1.5 Disordered eating pattern As related to eating one meal a day and binging at other times is improving gradually and has reoccured due to increased stress As evidenced by her report and lack of food records. ?   ?Intervention:  Nutrition counseling for chronic kidney disease. Assisted patient with trying to reduce duration and salt intake weight gain from emotional  eating.   ?Action Goal: Continue attending support group and using MyFitnessPal at least once a week- she agreed to call when she starts using it again ?- Continue to work on identifying barriers to comforting herself in other ways..  ? Outcome goal: Gain confidence and continue progress towards consistent meal and activity tracking, long term weight loss maintenance to reach/maintian her desired quality of life  ?Coordination of care: none needed today ?Teaching Method Utilized: visual and   Auditory ?Handouts given during visit include:  After visit summary declined by patient.  ?Barriers to learning/adherence to lifestyle change: depression and life stressors ?Demonstrated degree of understanding via:  Verbalization  ? ?Monitoring/Evaluation:  Dietary intake and body weight  8 weeks (July 11th at 10:15 AM ?Debera Lat, RD ?07/16/2021 ?4:02 PM. ? ? ? ? ?

## 2021-07-25 ENCOUNTER — Ambulatory Visit (INDEPENDENT_AMBULATORY_CARE_PROVIDER_SITE_OTHER): Payer: Medicare HMO | Admitting: Internal Medicine

## 2021-07-25 VITALS — BP 130/52 | HR 92 | Temp 98.3°F | Ht 68.0 in | Wt 336.5 lb

## 2021-07-25 DIAGNOSIS — N1831 Chronic kidney disease, stage 3a: Secondary | ICD-10-CM

## 2021-07-25 DIAGNOSIS — G4733 Obstructive sleep apnea (adult) (pediatric): Secondary | ICD-10-CM

## 2021-07-25 DIAGNOSIS — K08109 Complete loss of teeth, unspecified cause, unspecified class: Secondary | ICD-10-CM

## 2021-07-25 DIAGNOSIS — I700 Atherosclerosis of aorta: Secondary | ICD-10-CM

## 2021-07-25 DIAGNOSIS — F322 Major depressive disorder, single episode, severe without psychotic features: Secondary | ICD-10-CM

## 2021-07-25 DIAGNOSIS — R7303 Prediabetes: Secondary | ICD-10-CM | POA: Diagnosis not present

## 2021-07-25 DIAGNOSIS — N3946 Mixed incontinence: Secondary | ICD-10-CM | POA: Diagnosis not present

## 2021-07-25 DIAGNOSIS — Z789 Other specified health status: Secondary | ICD-10-CM

## 2021-07-25 DIAGNOSIS — Z86718 Personal history of other venous thrombosis and embolism: Secondary | ICD-10-CM

## 2021-07-25 DIAGNOSIS — M25512 Pain in left shoulder: Secondary | ICD-10-CM

## 2021-07-25 DIAGNOSIS — I10 Essential (primary) hypertension: Secondary | ICD-10-CM

## 2021-07-25 DIAGNOSIS — G8929 Other chronic pain: Secondary | ICD-10-CM

## 2021-07-25 DIAGNOSIS — M170 Bilateral primary osteoarthritis of knee: Secondary | ICD-10-CM

## 2021-07-25 DIAGNOSIS — I129 Hypertensive chronic kidney disease with stage 1 through stage 4 chronic kidney disease, or unspecified chronic kidney disease: Secondary | ICD-10-CM

## 2021-07-25 DIAGNOSIS — E042 Nontoxic multinodular goiter: Secondary | ICD-10-CM

## 2021-07-25 DIAGNOSIS — J432 Centrilobular emphysema: Secondary | ICD-10-CM

## 2021-07-25 DIAGNOSIS — Z7409 Other reduced mobility: Secondary | ICD-10-CM

## 2021-07-25 LAB — POCT GLYCOSYLATED HEMOGLOBIN (HGB A1C): Hemoglobin A1C: 5.5 % (ref 4.0–5.6)

## 2021-07-25 LAB — GLUCOSE, CAPILLARY: Glucose-Capillary: 89 mg/dL (ref 70–99)

## 2021-07-25 NOTE — Assessment & Plan Note (Signed)
Surveillance thyroid ultrasound: 1 solid nodule greater than 1 cm (2.4 x 1.5 x 1.4 cm) located mid to inferior right thyroid lobe, stable in size, follow-up 1 year recommended.

## 2021-07-25 NOTE — Patient Instructions (Signed)
Ms. Patricia, [name] to see you today!  We discussed our priorities of working on reducing weight, and addressing your knee and shoulder pain.  We agreed to referral to the sports medicine clinic for consideration of a left shoulder injection.  If this is not successful, you may be referred to a shoulder surgeon to discuss options.  We agreed to referral to the [facility]!  You will be contacted by phone to get this arranged.  We agreed to check your kidney function today, and to check your diabetes test (A1C).  I am hopeful that you will qualify for a new medicine which helps with weight loss and with regulation of blood sugar.  I'll call you with results.  We're going to turn this ship around!  I believe in you.  There are many things going in the right direction.  Let's focus on the positives!  Take care and stay well, Dr. Mayford Knife

## 2021-07-25 NOTE — Progress Notes (Signed)
F/u of weight, activity level, shoulder pain, depression.  L shoulder pain, topical voltaren hasn't been effective.   Still seeing psychiatrist and counsellor. Fatigues and gets dyspneic easily, utilized a transport chair to get here.  More edema in feet.  Eating salty foods.  Checks BP at home, usually sbp < 130.  Adherent to CPAP.  Autotritration.  Well fitting mask.  Still has problems falling asleep, even during the day and engaged in activity.    Getting up to go to bathroom, nocturia with occasional incontinence (partly functional).  Trying to "get fluid off by drinking water".    Patient Active Problem List   Diagnosis Date Noted   Left shoulder pain 06/05/2021   Multinodular goiter 06/21/2019   Centrilobular emphysema ([place]) 03/12/2019   Abdominal aortic atherosclerosis ([place]) 02/13/2019   No natural teeth 12/18/2016   Mixed incontinence 04/05/2015   Chronic kidney disease, stage 3 unspecified ([place]) 07/28/2014   Osteopenia 03/08/2014   Preventative health care 10/21/2010   Hyperlipidemia 03/07/2006   Obesity, Class III, BMI 40-49.9 (morbid obesity) ([place]) 01/19/2006   Severe major depression ([place]) 01/19/2006   OSA (obstructive sleep apnea) 01/19/2006   HTN (hypertension) 01/19/2006   OA (osteoarthritis) of knee 01/19/2006   History of hepatitis B 01/19/2006   PULMONARY EMBOLISM, HX OF 01/19/2006    Current Outpatient Medications:    ALPRAZolam (XANAX) 1 MG tablet, Take 1 tablet by mouth at bedtime., Disp: , Rfl:    buPROPion (WELLBUTRIN SR) 200 MG 12 hr tablet, , Disp: , Rfl:    Multiple Vitamin (MULTIVITAMIN) tablet, Take 1 tablet by mouth daily. (Patient taking differently: Take 1 tablet by mouth every evening.), Disp: 30 tablet, Rfl: 3   pravastatin (PRAVACHOL) 40 MG tablet, Take 1 tablet (40 mg total) by mouth daily., Disp: 90 tablet, Rfl: 3   rivaroxaban (XARELTO) 20 MG TABS tablet, TAKE 1 TABLET EVERY DAY WITH SUPPER, Disp: 90 tablet, Rfl: 3   vortioxetine HBr  (TRINTELLIX) 5 MG TABS tablet, Take 1 tablet (5 mg total) by mouth every evening., Disp: , Rfl:    diclofenac Sodium (VOLTAREN) 1 % GEL, Apply 4 g topically 4 (four) times daily., Disp: 50 g, Rfl: 1   Omega-3 Fatty Acids (OMEGA-3 FISH OIL PO), Take 1 capsule by mouth every evening. , Disp: , Rfl:    BP (!) 130/52 (BP Location: Right Arm, Patient Position: Sitting, Cuff Size: Normal)   Pulse 92   Temp 98.3 F (36.8 C) (Oral)   Ht '5\' 8"'  (1.727 m)   Wt (!) 336 lb 8 oz (152.6 kg)   SpO2 98%   BMI 51.16 kg/m  PE: Toeneails neatly trimmed, feet in excellent repair, puffy ankles and feet w/o pitting.  DP pulses 2+ .  L shoulder tender anteriorly.  LImited ROM beyond 90 degrees L shoulder abduction.  Knees symmetric, thickened joints without increased swelling, warmth, or tenderness.  Heart RRR no murmur.  Lungs clear, nml resp effort.  No carotid bruits, no JVD. Thyroid is not palpably abnormal. Mixed incontinence Wears disposables, has a pad on the bed.  Myrbetric not effective.    No natural teeth Has dentures.  Abdominal aortic atherosclerosis (H[facility]) Asymptomatic.  DP pulses 2+ bilateral.  On statin and BP well controlled.  Monitor.  HTN (hypertension) 130/52- Home BPs around the same range.  Not currently requiring antihypertensive.  Diet is heavy in salt intake which she is trying to address. Monitor with lifestyle interventions.   Centrilobular emphysema (HCC) No cough.  Lungs clear, no prolonged expiratory phase.  Requires no pulmonary medications.  Dyspneic on exertion though this is secondary to increasing weight.  OSA (obstructive sleep apnea) Adherent to nightly CPAP, mask fitting well, no problems.  Monitor.  Multinodular goiter Surveillance thyroid ultrasound: 1 solid nodule greater than 1 cm (2.4 x 1.5 x 1.4 cm) located mid to inferior right thyroid lobe, stable in size, follow-up 1 year recommended.  OA (osteoarthritis) of knee Chronic bilateral knee discomfort primarily  with weightbearing, exacerbated with recent increase in weight.  Knees are not currently inflamed.  Tylenol ineffective.  NSAIDs contraindicated due to history of AKI.  Weight loss will require both dietary changes and exercise.  She is referred to physicians exercise program.  Chronic kidney disease, stage 3 unspecified (HCC) eGFR 56 (improved, though with decreased serum bicarb of 18) in 08/2020.  Recheck today.  She avoids oral NSAIDs.  No other nephrotoxic medications.  Personal history of submassive PE 2015 requiring tpa; on lifelong anticoagulation Adherent to anticoagulation with no recurrences of VTE and no complications of anticoagulation.  Monitor.  Severe major depression ([place]) Continues treatment with her psychiatrist and therapy group.  Mood is stable.  She admits to addiction to nightly Xanax which she takes for sleep.  This is prescribed by psychiatrist.  She has become more active over the past year, is now enjoying the benefit of a weekly aide to help with household maintenance, and is interested in an exercise program.  No change in care plan.  Class 3 severe obesity due to excess calories with body mass index (BMI) of 50.0 to 59.9 in adult [facility]) Ongoing challenge.  She endorses emotional eating and high calorie snacking.  Interested in [employer] (physicians referral exercise program).  Continues to work with her dietician.    Left shoulder pain due to advanced [place] arthritis Sgnificant OA on xray, also showing some subluxation.  Limited ROM, interfering with ADLs and QOL.  Acetaminophen ineffective.  Topical NSAID ineffective.  Hx of AKI and ATN with oral NSAIDS in the past.  We discussed options of injections (she agrees with referral to Sports Med clinic) and consideration of a shoulder orthopedist referral.

## 2021-07-25 NOTE — Assessment & Plan Note (Addendum)
130/52- Home BPs around the same range.  Not currently requiring antihypertensive.  Diet is heavy in salt intake which she is trying to address. Monitor with lifestyle interventions.

## 2021-07-25 NOTE — Assessment & Plan Note (Signed)
Asymptomatic.  DP pulses 2+ bilateral.  On statin and BP well controlled.  Monitor.

## 2021-07-25 NOTE — Assessment & Plan Note (Signed)
Adherent to anticoagulation with no recurrences of VTE and no complications of anticoagulation.  Monitor.

## 2021-07-25 NOTE — Assessment & Plan Note (Signed)
Wears disposables, has a pad on the bed.  Myrbetric not effective.

## 2021-07-25 NOTE — Assessment & Plan Note (Signed)
Continues treatment with her psychiatrist and therapy group.  Mood is stable.  She admits to addiction to nightly Xanax which she takes for sleep.  This is prescribed by psychiatrist.  She has become more active over the past year, is now enjoying the benefit of a weekly aide to help with household maintenance, and is interested in an exercise program.  No change in care plan.

## 2021-07-25 NOTE — Assessment & Plan Note (Signed)
Has dentures.

## 2021-07-25 NOTE — Assessment & Plan Note (Signed)
Adherent to nightly CPAP, mask fitting well, no problems.  Monitor.

## 2021-07-25 NOTE — Assessment & Plan Note (Signed)
eGFR 56 (improved, though with decreased serum bicarb of 18) in 08/2020.  Recheck today.  She avoids oral NSAIDs.  No other nephrotoxic medications.

## 2021-07-25 NOTE — Assessment & Plan Note (Signed)
No cough.  Lungs clear, no prolonged expiratory phase.  Requires no pulmonary medications.  Dyspneic on exertion though this is secondary to increasing weight.

## 2021-07-25 NOTE — Assessment & Plan Note (Signed)
Chronic bilateral knee discomfort primarily with weightbearing, exacerbated with recent increase in weight.  Knees are not currently inflamed.  Tylenol ineffective.  NSAIDs contraindicated due to history of AKI.  Weight loss will require both dietary changes and exercise.  She is referred to physicians exercise program.

## 2021-07-26 LAB — BMP8+ANION GAP
Anion Gap: 16 mmol/L (ref 10.0–18.0)
BUN/Creatinine Ratio: 17 (ref 12–28)
BUN: 16 mg/dL (ref 8–27)
CO2: 20 mmol/L (ref 20–29)
Calcium: 9.4 mg/dL (ref 8.7–10.3)
Chloride: 102 mmol/L (ref 96–106)
Creatinine, Ser: 0.93 mg/dL (ref 0.57–1.00)
Glucose: 80 mg/dL (ref 70–99)
Potassium: 4.7 mmol/L (ref 3.5–5.2)
Sodium: 138 mmol/L (ref 134–144)
eGFR: 64 mL/min/{1.73_m2} (ref >59)

## 2021-07-29 ENCOUNTER — Telehealth: Payer: Self-pay

## 2021-07-29 NOTE — Telephone Encounter (Signed)
Called to discuss PREP program; She has a lot questions and concerns, and reservations; would like to know July schedules at [place]; reassured her that RN's run the program, we are aware of her history and can manage any limitations or make adjustments based on her needs; will follow up with both schedules at [facility]

## 2021-07-31 ENCOUNTER — Encounter: Payer: Self-pay | Admitting: Internal Medicine

## 2021-07-31 DIAGNOSIS — Z7409 Other reduced mobility: Secondary | ICD-10-CM | POA: Insufficient documentation

## 2021-07-31 DIAGNOSIS — Z789 Other specified health status: Secondary | ICD-10-CM | POA: Insufficient documentation

## 2021-07-31 NOTE — Assessment & Plan Note (Addendum)
Sgnificant OA on xray, also showing some subluxation.  Limited ROM, interfering with ADLs and QOL.  Acetaminophen ineffective.  Topical NSAID ineffective.  Hx of AKI and ATN with oral NSAIDS in the past.  We discussed options of injections (she agrees with referral to Sports Med clinic) and consideration of a shoulder orthopedist referral.

## 2021-07-31 NOTE — Assessment & Plan Note (Signed)
Ongoing challenge.  She endorses emotional eating and high calorie snacking.  Interested in [employer] (physicians referral exercise program).  Continues to work with her dietician.

## 2021-08-02 ENCOUNTER — Ambulatory Visit: Payer: Self-pay

## 2021-08-02 ENCOUNTER — Ambulatory Visit: Payer: Medicare HMO | Admitting: Sports Medicine

## 2021-08-02 VITALS — BP 129/62 | Ht 68.0 in | Wt 330.0 lb

## 2021-08-02 DIAGNOSIS — G8929 Other chronic pain: Secondary | ICD-10-CM

## 2021-08-02 DIAGNOSIS — M19012 Primary osteoarthritis, left shoulder: Secondary | ICD-10-CM

## 2021-08-02 DIAGNOSIS — M25512 Pain in left shoulder: Secondary | ICD-10-CM | POA: Diagnosis not present

## 2021-08-02 MED ORDER — METHYLPREDNISOLONE ACETATE 40 MG/ML IJ SUSP
40.0000 mg | Freq: Once | INTRAMUSCULAR | Status: AC
  Administered 2021-08-02: 40 mg via INTRA_ARTICULAR

## 2021-08-02 NOTE — Progress Notes (Signed)
SUBJECTIVE:   CHIEF COMPLAINT / HPI:   Chronic left shoulder pain Patient presents to our clinic today for evaluation for chronic left shoulder pain.  She has seen her primary care doctor on multiple occasions for this left shoulder pain.  She had an x-ray left shoulder showing advanced glenohumeral degenerative arthritis.  Patient reports that she did not notice much pain until a few months ago when suddenly she noticed severe pain.  She has had decreased range of motion with the shoulder and so has been not moving it much.  Has been using topical Voltaren gel but not taking any over-the-counter medications.  She reports Tylenol does not help with the pain at all.  Has never had any injections in the shoulder or in her knees.  OBJECTIVE:   BP 129/62   Ht 5\' 8"  (1.727 m)   Wt (!) 330 lb (149.7 kg)   BMI 50.18 kg/m   General: Pleasant 76 year old female in no acute distress Respiratory: Breathing, speaking full sentences Left shoulder exam: No swelling, ecchymoses.  No gross deformity. No TTP. Decreased active range of motion with 30 degrees abduction, 50 degrees internal rotation, 30 degrees external rotation Decreased passive range of motion with 90 degrees abduction, 60 degrees internal rotation, 60 degrees external rotation Full strength initially but limited due to pain in the left NV intact distally.   ASSESSMENT/PLAN:   Left shoulder pain due to advanced GH arthritis Patient with significant osteoarthritis of the left shoulder on x-ray.  Subluxation noted with range of motion exercises.  Limited range of motion.  Discussed treatment options today including topical medications, injections, referral for orthopedics for surgery and patient opted for injection under ultrasound today.  Injection under ultrasound performed and patient tolerated the procedure well.  See procedure note above.  Patient provided with strengthening exercises and follow-up in 1 month    68, MD [facility]   Sports Medicine Fellow Addendum:   I have independently interviewed and examined the patient. I have discussed the above with the original author and agree with their documentation. My edits for correction/addition/clarification have been made, see any changes above and below. Any attestation will be made below from the attending.   Procedure: US-guided glenohumeral joint injection, left shoulder After informed written consent and discussion on risks/benefits/indications a timeout was performed, patient was lying lateral recumbent on exam table. The patient's left shoulder was prepped with multiple alcohol swabs and utilizing ultrasound guidance, the patient's glenohumeral joint was identified on ultrasound. Using ultrasound guidance a 22-gauge, 3.5 inch needle with a mixture of 4:2 cc's lidocaine:depomedrol was directed from a lateral to medial direction via in-plane technique into the glenohumeral joint with visualization of appropriate spread of injectate into the joint. Patient tolerated the procedure well without immediate complications.   In summary, very pleasant 76 year old female who presents with acute on chronic left shoulder pain.  Independently reviewed her previous x-rays of the shoulder from 06/04/2021 which do show severe glenohumeral joint arthritis with some inferior migration of the humeral head.  Spur formation on the inferior aspect of the glenoid and humeral head.  Her range of motion is certainly limited on exam.  Through shared decision making, elected to proceed with ultrasound-guided injection into the glenohumeral joint.  Patient had improvement in pain and range of motion 10 minutes after the injection.  Cautioned on not using NSAIDs given her Xarelto use, may use Tylenol and Voltaren gel as able.  We did  give her home set of shoulder rehab exercises to improve range of motion and strength.  She was having some generalized knee  pain as well, we will follow this and her shoulder up in 1 month.  Madelyn Brunner, DO PGY-4, Sports Medicine Fellow [facility]  Addendum:  I was the preceptor for this visit and available for immediate consultation.  Norton Blizzard MD Marrianne Mood

## 2021-08-02 NOTE — Assessment & Plan Note (Signed)
Patient with significant osteoarthritis of the left shoulder on x-ray.  Subluxation noted with range of motion exercises.  Limited range of motion.  Discussed treatment options today including topical medications, injections, referral for orthopedics for surgery and patient opted for injection under ultrasound today.  Injection under ultrasound performed and patient tolerated the procedure well.  See procedure note above.  Patient provided with strengthening exercises and follow-up in 1 month

## 2021-08-02 NOTE — Patient Instructions (Signed)
It was wonderful taking care of you today.  I am sorry you are having these issues with chronic left shoulder pain.  We gave you a steroid injection in that left shoulder.  The pain should improve today but may get a little worse tomorrow but over the next few days should continue to improve.  Please ice your shoulder twice daily for the next 2 days.  We are giving you some shoulder strengthening exercises and would like for you to do them once a day.  We would like to follow-up with you in 1 month.  If you have any issues or concerns please feel free to call our clinic or follow-up with your primary care doctor.  I hope you have a wonderful day!

## 2021-08-22 ENCOUNTER — Encounter: Payer: Self-pay | Admitting: Internal Medicine

## 2021-08-22 NOTE — Telephone Encounter (Signed)
Patient called in requesting a new letter from PCP for her attorney. Patient was able to upload her letter to MyChart. Please see attached.

## 2021-09-06 ENCOUNTER — Telehealth: Payer: Self-pay

## 2021-09-06 NOTE — Telephone Encounter (Signed)
Call to pt to offer next PREP class starting at [facility] on 09/30/21 which will be a M/W class from 1pm-215pm Currently has an aide on M/W from 1p-5p for homemaker services Doesn't help with ambulation Would be best if a T/TH class would be available at [facility] will call back to offer when I have that available

## 2021-09-10 ENCOUNTER — Encounter: Payer: Self-pay | Admitting: Dietician

## 2021-09-10 ENCOUNTER — Ambulatory Visit (INDEPENDENT_AMBULATORY_CARE_PROVIDER_SITE_OTHER): Payer: Medicare HMO | Admitting: Dietician

## 2021-09-10 DIAGNOSIS — N1831 Chronic kidney disease, stage 3a: Secondary | ICD-10-CM | POA: Diagnosis not present

## 2021-09-10 NOTE — Patient Instructions (Addendum)
Your goal is to record your intake more often and drinking water.   I agree with lowering your sodium intake and good job on the physical activity. Hope the PREP program works for you.   You mentioned your hygiene is an issue.   Next appointment is September 5, at 10:15 AM.   Lupita Leash [phone]

## 2021-09-10 NOTE — Progress Notes (Signed)
Medical Nutrition Therapy:   Appt start time: 1020 end time: 1105 Total time 45 minutes Visit # 19  Last visit was 5/19 by phone  Goals- of increasing her activity and weight < 299#.   Expected outcomes/Reasons for goals- improve and maintain her independence. "Self care"  Assessment: Ms. Tortora presents today for nutritional counseling follow up for Chronic Kidney disease and weight loss to assist with her kidney disease and overall health. Ms. Fort states she is still having trouble with emotional eating due to the stress caused by her eviction case that is ongoing. She states the stress and anxiety from it have also caused her to  logging her food intake in [employer] less than she would like.  She is not ready to try to figure out what is getting in the way of her recording. Her legs show edema today, so her increased weight can be falsely elevated by fluid.  She is weighing herself daily. She continues seeing  a therapist and support group as well as a psychiatrist. She has been more active and enjoys her conversation with a home care person 2 days a week.  She is interested in the PREP class but is waiting until their class time opens up that does not conflict with her home care person's hours. Her blood pressure is at target..   Dietary Intake is Knorr's rice. Ground [place], vegetables that have sodium (she just realized this and is in the process of correcting it), water, watermelon, trail mix, "big big bag".  She often buys trail mix other places and when she eats it now, that is all she eats for the day and splits the bag into two days ANTHROPOMETRICS:Estimated body mass index is 51.3 kg/m as calculated from the following:   Height as of 08/02/21: 5\' 8"  (1.727 m).   Weight as of this encounter: 337 lb 6.4 oz (153 kg).  Wt Readings from Last 10 Encounters:  09/10/21 (!) 337 lb 6.4 oz (153 kg)  08/02/21 (!) 330 lb (149.7 kg)  07/25/21 (!) 336 lb 8 oz (152.6 kg)  06/04/21 (!) 323  lb 12.8 oz (146.9 kg)  10/11/20 (!) 328 lb 1.6 oz (148.8 kg)  09/27/20 (!) 327 lb 1.6 oz (148.4 kg)  08/16/20 (!) 332 lb 14.4 oz (151 kg)  07/05/20 (!) 335 lb 3.2 oz (152 kg)  06/28/20 (!) 337 lb 8 oz (153.1 kg)  05/03/20 (!) 339 lb 6.4 oz (154 kg)  Was 385.6# (02/2019) at the start and has achieved a 61.8# weight loss total and is maintaining that loss.      BP Readings from Last 3 Encounters:  08/02/21 129/62  07/25/21 (!) 130/52  06/04/21 125/70    Progress Towards Goal(s):  Some progress.   Nutritional Diagnosis:  NB-1.5 Disordered eating pattern As related to eating one meal a day and binging at other times is ongoing and  gradually improving and has reoccured due to increased stress As evidenced by her report and lack of food records.    Intervention:  Nutrition counseling for chronic kidney disease. Assisted patient with trying to reduce salt intake and weight gain from emotional eating.   Action Goal: Continue attending support group and using [employer] at least once a week.  - Continue to work on identifying barriers to comforting herself in other ways.   Outcome goal: Gain confidence and continue progress towards consistent meal and activity tracking, long term weight loss maintenance to reach/maintian her desired quality of life  Coordination  of care: none needed today Teaching Method Utilized: visual and  Auditory Handouts given during visit include:  After visit summary  Barriers to learning/adherence to lifestyle change: depression and life stressors Demonstrated degree of understanding via:  Verbalization   Monitoring/Evaluation:  Dietary intake and body weight  8 weeks September 5th at 10:15 AM Norm Parcel, RD 09/10/2021 11:39 AM.

## 2021-09-12 ENCOUNTER — Telehealth: Payer: Self-pay

## 2021-09-12 ENCOUNTER — Encounter: Payer: Self-pay | Admitting: Internal Medicine

## 2021-09-12 ENCOUNTER — Telehealth: Payer: Self-pay | Admitting: *Deleted

## 2021-09-12 NOTE — Telephone Encounter (Signed)
RTC to patient.  Needs to get a letter as soon as possible for her attorney.   Letter in chart needs to be used as guide to information that patient needs.

## 2021-09-12 NOTE — Telephone Encounter (Signed)
Call to patient.  Needs letter sent to attorney as soon as possible.  Previous note in chart to be used as guide.

## 2021-09-12 NOTE — Telephone Encounter (Signed)
Requesting a letter for attorney. Please call pt back.

## 2021-09-13 ENCOUNTER — Encounter: Payer: Self-pay | Admitting: Internal Medicine

## 2021-09-27 ENCOUNTER — Other Ambulatory Visit: Payer: Self-pay

## 2021-09-27 MED ORDER — PRAVASTATIN SODIUM 40 MG PO TABS
40.0000 mg | ORAL_TABLET | Freq: Every day | ORAL | 3 refills | Status: DC
Start: 2021-09-27 — End: 2022-08-15

## 2021-09-27 NOTE — Telephone Encounter (Signed)
pravastatin (PRAVACHOL) 40 MG tablet, REFILL REQUEST @ [facility] - [place], [place] - [address]

## 2021-10-10 ENCOUNTER — Other Ambulatory Visit: Payer: Self-pay

## 2021-10-10 ENCOUNTER — Encounter: Payer: Self-pay | Admitting: Internal Medicine

## 2021-10-10 ENCOUNTER — Ambulatory Visit (INDEPENDENT_AMBULATORY_CARE_PROVIDER_SITE_OTHER): Payer: Medicare HMO | Admitting: Internal Medicine

## 2021-10-10 VITALS — BP 121/59 | HR 73 | Temp 98.4°F | Ht 68.0 in | Wt 333.4 lb

## 2021-10-10 DIAGNOSIS — I129 Hypertensive chronic kidney disease with stage 1 through stage 4 chronic kidney disease, or unspecified chronic kidney disease: Secondary | ICD-10-CM

## 2021-10-10 DIAGNOSIS — Z6841 Body Mass Index (BMI) 40.0 and over, adult: Secondary | ICD-10-CM

## 2021-10-10 DIAGNOSIS — I10 Essential (primary) hypertension: Secondary | ICD-10-CM

## 2021-10-10 DIAGNOSIS — Z87891 Personal history of nicotine dependence: Secondary | ICD-10-CM

## 2021-10-10 DIAGNOSIS — F322 Major depressive disorder, single episode, severe without psychotic features: Secondary | ICD-10-CM

## 2021-10-10 DIAGNOSIS — G4733 Obstructive sleep apnea (adult) (pediatric): Secondary | ICD-10-CM

## 2021-10-10 DIAGNOSIS — N3946 Mixed incontinence: Secondary | ICD-10-CM

## 2021-10-10 DIAGNOSIS — N1831 Chronic kidney disease, stage 3a: Secondary | ICD-10-CM

## 2021-10-10 NOTE — Assessment & Plan Note (Signed)
She is working on this 

## 2021-10-10 NOTE — Assessment & Plan Note (Signed)
Continues on same regimen under psychiatrist care.  Generally she is doing better, though still has days (even several in a row) when she does not want to get out of bed.  Concerns about her future, aging alone, and her housing situation are ongoing drivers.  She does not have suicidal ideation and is motivated to get better.

## 2021-10-10 NOTE — Assessment & Plan Note (Signed)
Urologist 09/26/21, trying 1 mo samples of new medicine for UI, beneficial, though wants an external catheter.  Urologist gave her a prescription for PureWick, no supplers here, cost out-of-pocket would be financially very difficult.  Still getting up at night to void (drinks most of her fluids later in the day); daytime disposables are not as wet.  She will try to reduce her late day fluid intake.  I am not aware of how to help with finding an affordable external catheter system for home use.

## 2021-10-10 NOTE — Patient Instructions (Signed)
Ms. Patricia, [name] to see you doing so well!  Your blood pressure is excellent.  We discussed trying to decrease the amount of fluid you drink in the later half of the day, so that your bladder doesn't fill up as much over night.  Keep an eye on the salt; it causes [place] to retain fluid and swell.    I look forward to hearing good news about your housing.  Fingers crossed!  Take care and see you in 3 months.  I'll call with your results or message you in my chart.  Dr. Mayford Knife

## 2021-10-10 NOTE — Assessment & Plan Note (Addendum)
eGFR  improved to 64 with bicarb 20 at last visit 07/2021; recheck today and may be able to adjust dx to CKD2.

## 2021-10-10 NOTE — Progress Notes (Signed)
[name] is here for routine follow-up.  Since our last visit, she continues to work on some housing issues, assisted by legal aid, though this remains a significant stressor for her. She should hear something by Monday.  Urologist 09/26/21, trying 1 mo samples of new medicine for UI, beneficial, though wants an external catheter.  Urologist gave her a prescription for PureWick, no supplers here, cost out-of-pocket would be financially very difficult.  Still getting up at night to void (drinks most of her fluids later in the day); daytime disposables are not as wet.    Walked all the way from parking lot, and also walks at the store together with her aide. This is a huge accomplishment!  She is working on staying healthy, recognizing that she will be aging alone and does not want to go to a nursing home.  She has placed to her name on the list for an independent living community.  Continues to have low moods associated with her housing stress; spent last week mostly in bed, couldn't motivate herself to get up, beut yesterday got up and enjoyed an hour-long shower (aide was within voice range, she didn't require assistance.  Remains under the psychiatrist and therapist care.  Noticing fluid retenion, mild, associated with prepared foods with high salt content.  Physical exam BP (!) 121/59 (BP Location: Right Arm, Patient Position: Sitting, Cuff Size: Large)   Pulse 73   Temp 98.4 F (36.9 C) (Oral)   Ht '5\' 8"'  (1.727 m)   Wt (!) 333 lb 6.4 oz (151.2 kg)   SpO2 97%   BMI 50.69 kg/m  Nicely dressed and groomed, positive demeanor.  Arises from chair with arm pushoff and walks with a slow cautious stable gait.  Assessment and Plan: HTN (hypertension) 121/59 without medication.  Still an opportunity to reduce salt in the diet, which recently has been causing fluid retention.  OSA (obstructive sleep apnea) Adherent to nightly CPAP.  Chronic kidney disease, stage 3 unspecified (HCC) eGFR   improved to 64 with bicarb 20 at last visit 07/2021; recheck today and may be able to adjust dx to CKD2.  Class 3 severe obesity due to excess calories with body mass index (BMI) of 50.0 to 59.9 in adult [facility]) She is working on this.    Mixed incontinence Urologist 09/26/21, trying 1 mo samples of new medicine for UI, beneficial, though wants an external catheter.  Urologist gave her a prescription for PureWick, no supplers here, cost out-of-pocket would be financially very difficult.  Still getting up at night to void (drinks most of her fluids later in the day); daytime disposables are not as wet.  She will try to reduce her late day fluid intake.  I am not aware of how to help with finding an affordable external catheter system for home use.  Severe major depression ([name]) Continues on same regimen under psychiatrist care.  Generally she is doing better, though still has days (even several in a row) when she does not want to get out of bed.  Concerns about her future, aging alone, and her housing situation are ongoing drivers.  She does not have suicidal ideation and is motivated to get better.

## 2021-10-10 NOTE — Assessment & Plan Note (Signed)
121/59 without medication.  Still an opportunity to reduce salt in the diet, which recently has been causing fluid retention.

## 2021-10-10 NOTE — Assessment & Plan Note (Signed)
Adherent to nightly CPAP.

## 2021-10-11 LAB — BMP8+ANION GAP
Anion Gap: 14 mmol/L (ref 10.0–18.0)
BUN/Creatinine Ratio: 9 — ABNORMAL LOW (ref 12–28)
BUN: 9 mg/dL (ref 8–27)
CO2: 22 mmol/L (ref 20–29)
Calcium: 9.5 mg/dL (ref 8.7–10.3)
Chloride: 102 mmol/L (ref 96–106)
Creatinine, Ser: 1.02 mg/dL — ABNORMAL HIGH (ref 0.57–1.00)
Glucose: 86 mg/dL (ref 70–99)
Potassium: 4.4 mmol/L (ref 3.5–5.2)
Sodium: 138 mmol/L (ref 134–144)
eGFR: 58 mL/min/{1.73_m2} — ABNORMAL LOW (ref >59)

## 2021-10-29 NOTE — Progress Notes (Signed)
Letter  has been sent and received.  Issue should be closed.

## 2021-11-05 ENCOUNTER — Encounter: Payer: Self-pay | Admitting: Dietician

## 2021-11-05 ENCOUNTER — Ambulatory Visit: Payer: Medicare HMO | Admitting: Dietician

## 2021-11-05 NOTE — Progress Notes (Signed)
[facility] Therapy:   Appt start time: 1025  end time:1055  Total time 30 minutes Visit # 20  Last visit was 09/10/21  Goals- of increasing her activity and weight < 299#.   Expected outcomes/Reasons for goals- improve and maintain her independence. "Self care"  Assessment: Ms. Botelho presents today for nutritional counseling follow up for Chronic Kidney disease and weight loss to assist with her kidney disease and overall health. Ms. Pollina states she is still having trouble with emotional eating due to the stress, however she is trying techniques to help moderate her intake. She states and we reveiwed her food and activity log in Myfitnesspal. Her ankles show some edema today, but her weight is still decreased by 8# from her last visit. .  She is weighing herself daily. She continues seeing  a therapist and support group as well as a psychiatrist. She has been more active and enjoys her conversation with a home care person 2 days a week.  She looked into PREP an dis waiting for a class that fits into her scheduled to be come available. is seeking treatment for incontinence.  Her blood pressure remains at target off all medications..   ANTHROPOMETRICS:Estimated body mass index is 50.09 kg/m as calculated from the following:   Height as of 10/10/21: 5\' 8"  (1.727 m).   Weight as of this encounter: 329 lb 6.4 oz (149.4 kg).  Wt Readings from Last 10 Encounters:  11/05/21 (!) 329 lb 6.4 oz (149.4 kg)  10/10/21 (!) 333 lb 6.4 oz (151.2 kg)  09/10/21 (!) 337 lb 6.4 oz (153 kg)  08/02/21 (!) 330 lb (149.7 kg)  07/25/21 (!) 336 lb 8 oz (152.6 kg)  06/04/21 (!) 323 lb 12.8 oz (146.9 kg)  10/11/20 (!) 328 lb 1.6 oz (148.8 kg)  09/27/20 (!) 327 lb 1.6 oz (148.4 kg)  08/16/20 (!) 332 lb 14.4 oz (151 kg)  07/05/20 (!) 335 lb 3.2 oz (152 kg)  Was 385.6# (02/2019) at the start and has achieved a 56.2# weight loss total and is maintaining that loss.      BP Readings from Last 3  Encounters:  10/10/21 (!) 121/59  08/02/21 129/62  07/25/21 (!) 130/52    Progress Towards Goal(s):  Some progress.   Nutritional Diagnosis:  NB-1.5 Disordered eating pattern As related to binge eating  is ongoing and  gradually improving and has reoccured due to increased stress As evidenced by her report and lack of food records.    Intervention:  Nutrition counseling for chronic kidney disease. Assisted patient with trying to reduce salt intake, increase potassium and continue weight loss.   Action Goal: Continue attending support group and using MyFitnessPal.  - Continue to work on identifying barriers to comforting herself in other ways.   Outcome goal: Gain confidence and continue progress towards consistent meal and activity tracking, long term weight loss maintenance to reach/maintian her desired quality of life  Coordination of care: none needed today Teaching Method Utilized: visual and  Auditory Handouts given during visit include:  After visit summary  Barriers to learning/adherence to lifestyle change: depression and life stressors Demonstrated degree of understanding via:  Verbalization   Monitoring/Evaluation:  Dietary intake and body weight  8 weeks at 10:15 AM 07/27/21, RD 11/05/2021 11:48 AM.

## 2021-11-05 NOTE — Patient Instructions (Signed)
Water intake- trying to drink 8 cups a day before 6 PM  Add potassium to your list? So you can be sure you get enough.  Always best to get your nutrition from food.  Congrats on logging your food and activity!!!  Lift legs above heart to get fluid to kidneys so it can get out.   Lupita Leash [phone]

## 2021-11-12 ENCOUNTER — Other Ambulatory Visit: Payer: Self-pay | Admitting: Dietician

## 2021-11-12 DIAGNOSIS — N1831 Chronic kidney disease, stage 3a: Secondary | ICD-10-CM

## 2021-11-12 NOTE — Progress Notes (Signed)
Referral request. Thank you!

## 2021-12-19 ENCOUNTER — Ambulatory Visit: Payer: Medicare HMO | Admitting: Sports Medicine

## 2021-12-19 DIAGNOSIS — M25511 Pain in right shoulder: Secondary | ICD-10-CM | POA: Insufficient documentation

## 2021-12-19 MED ORDER — METHYLPREDNISOLONE ACETATE 40 MG/ML IJ SUSP
40.0000 mg | Freq: Once | INTRAMUSCULAR | Status: AC
Start: 2021-12-19 — End: 2021-12-19
  Administered 2021-12-19: 40 mg via INTRA_ARTICULAR

## 2021-12-19 NOTE — Progress Notes (Signed)
   Subjective:    Patient ID: [name], female    DOB: 03-07-1945, 76 y.o.   MRN: [id number]  HPI chief complaint: Right shoulder pain  Patient is a very pleasant right-hand-dominant 76 year old female that comes in today complaining of approximately 2 weeks of lateral right shoulder pain.  No trauma to explain her pain.  Her pain is worse at night.  She has great difficulty with any sort of shoulder motion.  She not had any significant problems with her right shoulder in the past she does have a history of a left shoulder glenohumeral DJD.  This was treated by Dr. Rolena Infante with an ultrasound-guided glenohumeral injection in June of this year.  She has done well with this.  She is unable to take oral NSAIDs due to stage III chronic kidney disease.    Review of Systems As above    Objective:   Physical Exam  Well-developed, well-nourished.  No acute distress  Right shoulder: Limited active range of motion in all planes.  Passive range of motion is better with passive forward flexion and abduction.  Passive external rotation bilaterally shows full range of motion here.  She does have some global rotator cuff weakness likely secondary to pain.  She is neurovascular intact distally.      Assessment & Plan:   Right shoulder pain likely secondary to rotator cuff tendinitis  We will start today with a simple subacromial cortisone injection.  This was accomplished atraumatically under sterile technique after risks and benefits were explained.  She tolerates this without difficulty.  If symptoms persist then consider an x-ray of the right shoulder to evaluate degree of glenohumeral osteoarthritis present.  We may then need to schedule an ultrasound-guided glenohumeral injection afterwards.  If pain is alleviated with today's subacromial injection then she may follow-up as needed.  Consent obtained and verified. Time-out conducted. Noted no overlying erythema, induration, or other  signs of local infection. Skin prepped in a sterile fashion. Topical analgesic spray: Ethyl chloride. Joint: Right shoulder, subacromial Needle: 25-gauge 1.5 inch Completed without difficulty. Meds: 3 cc 1% Xylocaine, 1 cc (40 mg) Depo-Medrol  This note was dictated using Dragon naturally speaking software and may contain errors in syntax, spelling, or content which have not been identified prior to signing this note.

## 2021-12-26 ENCOUNTER — Telehealth: Payer: Self-pay

## 2021-12-26 DIAGNOSIS — M25511 Pain in right shoulder: Secondary | ICD-10-CM

## 2021-12-26 NOTE — Telephone Encounter (Signed)
-----   Message from Carolyne Littles sent at 12/26/2021  1:41 PM EDT ----- Regarding: order request Pt states injection in rt shoulder has helped but is requesting an order for xray due to waking up with pain.

## 2022-01-02 ENCOUNTER — Other Ambulatory Visit: Payer: Self-pay | Admitting: Internal Medicine

## 2022-01-02 NOTE — Addendum Note (Signed)
Addended by: Jolinda Croak E on: 01/02/2022 04:13 PM   Modules accepted: Orders

## 2022-01-02 NOTE — Telephone Encounter (Signed)
Order placed

## 2022-01-14 ENCOUNTER — Encounter: Payer: Self-pay | Admitting: Dietician

## 2022-01-14 ENCOUNTER — Ambulatory Visit (INDEPENDENT_AMBULATORY_CARE_PROVIDER_SITE_OTHER): Payer: Medicare HMO | Admitting: Dietician

## 2022-01-14 DIAGNOSIS — N1831 Chronic kidney disease, stage 3a: Secondary | ICD-10-CM

## 2022-01-14 NOTE — Progress Notes (Signed)
[facility]:   Appt start time: 1020  end time: 1115 Total time: 55 minutes Visit # 21  Last visit was 11/05/21  Goals- of increasing her activity and weight < 299#.   Expected outcomes/Reasons for goals- improve and maintain her independence. "Self care"  Assessment:   This is a telephone encounter between Jadie Meya Clutter and Lupita Leash Tabby Beaston on 01/14/2022 for Medical Nutrition Therapy. The visit was conducted with the patient located at home and Norm Parcel at [facility]. The patient's identity was confirmed using their DOB and current address. The patient has consented to being evaluated through a telephone encounter and understands the associated risks / benefits (allows the patient to remain at home, decreasing exposure to coronavirus). I personally spent 60 minutes on medical nutrition therapy discussion.   The following statements were read to the patient and/or legal guardian that are established with the Nutrititional Health Provider.   "The purpose of this phone visit is to provide behavioral health care while limiting exposure to the coronavirus (COVID19).  There is a possibility of technology failure and discussed alternative modes of communication if that failure occurs."   "By engaging in this telephone visit, you consent to the provision of healthcare.  Additionally, you authorize for your insurance to be billed for the services provided during this telephone visit."    Patient and/or legal guardian consented to telephone visit: yes   Ms. Kernodle presents today for nutritional counseling follow up for Chronic Kidney disease and weight loss to assist with her kidney disease and overall health. Ms. Kunz states she is still having trouble with emotional eating due to the stress of possible eviction.   She states "not doing good- gained a lot of weight fast and it is scaring her.    Weight about 350#; knees hurting, incontinence issues and difficulty ambulating  resurfacing; as well as her blood pressure is increased.  Behaviors- stress eating, now 2 family packs of Oreos in one day, takes them to her bed in addition to the trail mix She is seeing her psychiatrist who reduced wellbutrin to 1 tablet, and increased Trintellix to 10 mg, to help with mood, anxiety, x 1 month now. Ms Hawbaker I unsure if it is helping and wonders if it is contributing to her fast weight gain.  Continues to attend Support group,  Counselor stopped  Alex- hurt right arm, already had problems with left,sports med gave her shot, waiting to get xray This pain is affecting her sleep. She plans to get it xrayed as ordered today.  Ms Brierley was unable to identify chain of events behind uncontrollable eating of Oreos and trail mix and thinks it is mostly fear and anxiety.  Would like to consider weight loss drugs she has heard about: tirazamide, semaglutide and liraglutide; does not like that she may regain or that they are injectable Has a new home aide who agreed to help her continue to move more.  Not recording in MyFitnesspal and does not want to because she does not want to see the amount of calories she consumes of Oreos and trail mix.  Has found support meetings to help herself- Emotions Anonymous and NAMI- looking for a class to help her reset ability to sense fullness 2x /month- reminded her that this is mindful eating Recommended Take Off Pounds Sensibly meetings, but she feels EA is more important right now.    She is weighing herself but not daily.   She looked into PREP and is waiting  for a class that fits into her scheduled to be come available.   ANTHROPOMETRICS: reported weight 350#, BMI now 53 kg/m increased from  Estimated body mass index is 50.94 kg/m as calculated from the following:   Height as of 12/19/21: 5\' 8"  (1.727 m).   Weight as of 12/19/21: 335 lb (152 kg).  Was 385.6# (02/2019) at the start and has achieved a 56.2# total and has now reportedly regained  20+ pounds. .      BP Readings from Last 3 Encounters:  12/19/21 (!) 153/81  10/10/21 (!) 121/59  08/02/21 129/62    Progress Towards Goal(s):  Modified goal(s).   Nutritional Diagnosis:  NB-1.5 Disordered eating pattern As related to binge eating  is ongoing and  gradually improving and has reoccured due to increased stress As evidenced by her report and lack of food records.    Intervention:  Nutrition counseling for chronic kidney disease. Assisted patient with trying to reduce salt intake, increase potassium and continue weight loss.   Action Goal: For the next 2 months until we meet again , I will continue attend the EA, nami and hope meetings, to help me learn to get control of anxiety/eating/cope differently with unknown feelings.  Follow up in January on same day she sees Dr. February.  Outcome goal: Gain confidence and continue progress towards consistent and balanced meal intake and increased activity, long term weight loss maintenance to reach/maintian her desired quality of life  Coordination of care: wants me to ask/discuss wt loss medications for her with Dr. Mayford Knife    Teaching Method Utilized: visual and  Auditory Handouts given during visit include:  After visit summary  Barriers to learning/adherence to lifestyle change: depression and life stressors Demonstrated degree of understanding via:  Verbalization   Monitoring/Evaluation:  Dietary intake and body weight  8 weeks to be determined Mayford Knife, RD 01/14/2022 11:26 AM.

## 2022-01-14 NOTE — Patient Instructions (Addendum)
Thank you for your visit today!  We talked about:   The chain of events behind uncontrolled eating What is mindful eating  When on a weight-loss journey, setbacks are bound to occur. When they do, it is important to get back on track as quickly as possible. The key is to focus on creating a healthy lifestyle and way of eating that is not only attainable, but sustainable for the long-term.  Changing one's lifestyle is a learned skill set, and like most things in life it takes hard work and the right state of mind.  Focus on what you are trying to eat more of, not what you are trying to eat less of.   Change your environment. Over time we all develop routines. The longer we've been in a routine, the more difficult it is to change. The same is true with evolving our eating habits, to the point where we often don't know where to start. To get things rolling, take a look at your environment and how you can make it easy to choose healthy foods.   Incorporate physical activity that you enjoy into your day.  Take the time to meal plan and prepare.  Incorporate action-based forms of self-care into your routine.Like going to your support group meetings!  Goals to work on:   1-For the next 2 months until we meet again , I will continue attend the EA, nami and hope meetings, to help me learn to get control of anxiety/eating/cope differently with unknown feelings.   2- Follow up in January on same day you see Dr. Mayford Knife.  Please feel free to call me anytime.  Lupita Leash [phone]

## 2022-01-20 ENCOUNTER — Telehealth: Payer: Self-pay

## 2022-02-13 ENCOUNTER — Ambulatory Visit: Payer: Self-pay | Admitting: Licensed Clinical Social Worker

## 2022-02-13 NOTE — Patient Outreach (Signed)
SW removed self from care team.   Jaquavius Hudler, BSW, MSW, LCSW-A  Social Worker [facility]  [phone] 

## 2022-03-27 ENCOUNTER — Ambulatory Visit (INDEPENDENT_AMBULATORY_CARE_PROVIDER_SITE_OTHER): Payer: Medicare HMO | Admitting: Internal Medicine

## 2022-03-27 ENCOUNTER — Ambulatory Visit (HOSPITAL_COMMUNITY)
Admission: RE | Admit: 2022-03-27 | Discharge: 2022-03-27 | Disposition: A | Discharge status: Home / Self Care | Payer: Medicare HMO | Source: Ambulatory Visit | Attending: Internal Medicine | Admitting: Internal Medicine

## 2022-03-27 ENCOUNTER — Ambulatory Visit: Payer: Medicare HMO | Admitting: Dietician

## 2022-03-27 VITALS — BP 133/69 | HR 80 | Temp 98.4°F | Ht 68.0 in | Wt 360.0 lb

## 2022-03-27 DIAGNOSIS — N1831 Chronic kidney disease, stage 3a: Secondary | ICD-10-CM

## 2022-03-27 DIAGNOSIS — W1800XA Striking against unspecified object with subsequent fall, initial encounter: Secondary | ICD-10-CM

## 2022-03-27 DIAGNOSIS — R7303 Prediabetes: Secondary | ICD-10-CM

## 2022-03-27 DIAGNOSIS — R0789 Other chest pain: Secondary | ICD-10-CM | POA: Diagnosis present

## 2022-03-27 DIAGNOSIS — Z6841 Body Mass Index (BMI) 40.0 and over, adult: Secondary | ICD-10-CM

## 2022-03-27 LAB — POCT GLYCOSYLATED HEMOGLOBIN (HGB A1C): Hemoglobin A1C: 5.8 % — AB (ref 4.0–5.6)

## 2022-03-27 LAB — GLUCOSE, CAPILLARY: Glucose-Capillary: 104 mg/dL — ABNORMAL HIGH (ref 70–99)

## 2022-03-27 MED ORDER — LIDOCAINE 5 % EX PTCH: 1.0000 | MEDICATED_PATCH | Freq: Two times a day (BID) | CUTANEOUS | 0 refills | Status: DC

## 2022-03-27 NOTE — Progress Notes (Signed)
Routine visit for [name] to f/u on chronic conditions (severe obesity, severe depression with emotional eating disorder, DM2, HTN, mobility impairment among others, last seen here in 10/2021.  In the interim she received a R subacromial injection at [facility] which was helpful.  She reports that she has slipped into her previous habits of staying in the bed all day and all night and has had difficulty curbing her eating.  She has an aide 3x/week, 8 hrs total.  Continues to attend 3 support groups a week on Zoom but is generally feeling disappointment and shame.  She is having increasing difficulty getting around which she attributes to her increasing weight, and several days ago experienced a fall in the bathroom striking her right side against the bathtub.  She has been experiencing pain particularly with movement and with deep breath and cough in that area ever since.  BP 133/69 (BP Location: Left Arm, Patient Position: Sitting, Cuff Size: Normal)   Pulse 80   Temp 98.4 F (36.9 C) (Oral)   Ht 5\' 8"  (1.727 m)   Wt (!) 360 lb (163.3 kg)   SpO2 99%   BMI 54.74 kg/m  Chest wall with point tenderness over the lower lateral right rib cage.  Ecchymosis noted just inferior and lateral to this area.  No break in skin.  She is able to bear weight and has no hip pain.  Class 3 severe obesity due to excess calories with body mass index (BMI) of 50.0 to 59.9 in adult [facility]) Weight has increased from 329 in 11/2021, to 360 today.  Her Zoom support calls have not been as beneficial as in person visits with therapists and today she agreed to meet our behavioral health clinician [name] and they have scheduled an appointment.  We are hoping that having a more personalized approach to her mood and eating disorder will be helpful.  Noted to be prediabetic today and semaglutide will be ordered for dual indication with obesity.  Fall against bathtub resulting in right rib fracture Localized area  of pain over right lateral lower rib cage evaluated with x-ray which identified nondisplaced right 10th rib fracture.  Lidoderm patches ordered for pain relief.  She was advised that the rib will repair on its own and that no operative intervention is needed.  Prediabetes A1c 5.8 today, increased from 5.5 last time it was checked (though she does have a history of prediabetes with A1c as high as 6.2).  Discussed medications and the GLP-1 agonist class and she is very interested in starting 1.  Prescribed Ozempic 0.25 mg weekly.  I am certain this will require prior authorization.     I will see [name] in 4 to 6 weeks for management of her other important chronic conditions.

## 2022-03-27 NOTE — Progress Notes (Signed)
[facility]:   Appt start time: 2119  end time: 1225 Total time: 30 minutes Visit # #22  Last visit was 11/23  Goals- of increasing her activity and weight < 299#.   Expected outcomes/Reasons for goals- improve and maintain her independence. "Self care"  Assessment:   [name] presents today for nutritional counseling follow up for Chronic Kidney disease and weight loss to assist with her kidney disease and overall health. [name] states she is still having trouble with emotional eating due to the stress of possible eviction.   She states "Recording her food intake causes guilt so she does it seldom, reports excessive calorie intake in addition to usual lower sodium and balanced meals"  She states that she is expecting to be  pre-diabetic, in order to qualify for a  medication.   She is weighing at home. Her weight continues to increase. Wt Readings from Last 10 Encounters:  03/27/22 (!) 360 lb (163.3 kg)  12/19/21 (!) 335 lb (152 kg)  11/05/21 (!) 329 lb 6.4 oz (149.4 kg)  10/10/21 (!) 333 lb 6.4 oz (151.2 kg)  09/10/21 (!) 337 lb 6.4 oz (153 kg)  08/02/21 (!) 330 lb (149.7 kg)  07/25/21 (!) 336 lb 8 oz (152.6 kg)  06/04/21 (!) 323 lb 12.8 oz (146.9 kg)  10/11/20 (!) 328 lb 1.6 oz (148.8 kg)  09/27/20 (!) 327 lb 1.6 oz (148.4 kg)   Blood pressure is in acceptable range this visit. BP Readings from Last 3 Encounters:  03/27/22 133/69  12/19/21 (!) 153/81  10/10/21 (!) 121/59   Behaviors- continues to eat large amounts of food often due to emotional stress.  Continues to attend Support groups weekly and sees psychiatrist biannually.    She reports that she continues to use CPAP daily.   [name] was unable to identify chain of events behind uncontrollable eating of Oreos and trail mix and thinks it is mostly fear and anxiety.  Would like to consider weight loss drugs she has heard about: tirazamide, semaglutide and liraglutide; does not like that she  may regain or that they are injectable Has a new home aide who agreed to help her continue to move more.  Not recording in MyFitnesspal and does not want to because she does not want to see the amount of calories she consumes of Oreos and trail mix.  Has found support meetings to help herself- Emotions Anonymous and NAMI- looking for a class to help her reset ability to sense fullness 2x /month- reminded her that this is mindful eating.She doesn't addressing food intake in the support groups.  Recommended Take Off Pounds Sensibly meetings, but she feels EA is more important right now.     Was 385.6# (02/2019) at the start and has achieved a 56.2# total and has now regained ~31% pounds. .      BP Readings from Last 3 Encounters:  12/19/21 (!) 153/81  10/10/21 (!) 121/59  08/02/21 129/62    Progress Towards Goal(s):  Modified goal(s).   Nutritional Diagnosis:  NB-1.5 Disordered eating pattern As related to binge eating  is ongoing and  gradually improving and has reoccured due to increased stress As evidenced by her report and lack of food records.    Intervention:  Nutrition counseling for chronic kidney disease. Assisted patient with trying to be aware in her intake and change of events triggering weight gain..   Action Goal:  I will continue attend the EA, nami and hope meetings, to  help me learn to get control of anxiety/eating/cope differently with unknown feelings.  Follow up after discussing patient care with Bianca   Outcome goal: Gain confidence and continue progress towards consistent and balanced meal intake and increased activity, long term weight loss maintenance to reach/maintian her desired quality of life, and independence. Coordination of care: Discus care with [place]. Handouts given during visit include:  After visit summary  Barriers to learning/adherence to lifestyle change: depression and life stressors Demonstrated degree of understanding via:  Verbalization    Monitoring/Evaluation:  Dietary intake and body weight  8 weeks to be determined [name], RD 01/14/2022 11:26 AM.

## 2022-03-27 NOTE — Patient Instructions (Addendum)
[name],   > A lapse is a normal part of the weight loss process.   > A lapse is a temporary and small slip in your weight loss efforts.  > A relapse is a return to previous eating and activity habits and is associated with significant weight regain.  > In order to avoid or effectively deal with lapses, it is important to identify situations that might be high-risk, and prepare a plan to deal  with those situations.    > Whatever you do, don't give up and revert to your old eating and exercise habits. Celebrate your successes and continue your efforts to maintain your weight loss.  Butch Penny [phone]

## 2022-03-29 LAB — BMP8+ANION GAP
Anion Gap: 16 mmol/L (ref 10.0–18.0)
BUN/Creatinine Ratio: 13 (ref 12–28)
BUN: 13 mg/dL (ref 8–27)
CO2: 21 mmol/L (ref 20–29)
Calcium: 9.5 mg/dL (ref 8.7–10.3)
Chloride: 103 mmol/L (ref 96–106)
Creatinine, Ser: 1 mg/dL (ref 0.57–1.00)
Glucose: 90 mg/dL (ref 70–99)
Potassium: 4.2 mmol/L (ref 3.5–5.2)
Sodium: 140 mmol/L (ref 134–144)
eGFR: 59 mL/min/{1.73_m2} — ABNORMAL LOW (ref >59)

## 2022-03-31 ENCOUNTER — Other Ambulatory Visit: Payer: Self-pay

## 2022-03-31 MED ORDER — RIVAROXABAN 20 MG PO TABS: ORAL_TABLET | ORAL | 3 refills | Status: DC

## 2022-03-31 NOTE — Progress Notes (Signed)
Updated [name] by phone of her rib fracture, the prediabetes (she wishes to try Ozempic), and her stable kidney fxn.  She is looking forward to her first visit with behavioral health professional.

## 2022-04-01 ENCOUNTER — Other Ambulatory Visit: Payer: Self-pay | Admitting: Internal Medicine

## 2022-04-01 ENCOUNTER — Ambulatory Visit (INDEPENDENT_AMBULATORY_CARE_PROVIDER_SITE_OTHER): Payer: Medicare HMO | Admitting: Licensed Clinical Social Worker

## 2022-04-01 DIAGNOSIS — F322 Major depressive disorder, single episode, severe without psychotic features: Secondary | ICD-10-CM

## 2022-04-01 DIAGNOSIS — R7303 Prediabetes: Secondary | ICD-10-CM

## 2022-04-01 MED ORDER — SEMAGLUTIDE(0.25 OR 0.5MG/DOS) 2 MG/3ML ~~LOC~~ SOPN: 0.2500 mg | PEN_INJECTOR | SUBCUTANEOUS | 1 refills | Status: DC

## 2022-04-02 ENCOUNTER — Encounter: Payer: Self-pay | Admitting: Internal Medicine

## 2022-04-02 DIAGNOSIS — W1800XA Striking against unspecified object with subsequent fall, initial encounter: Secondary | ICD-10-CM | POA: Insufficient documentation

## 2022-04-02 NOTE — Assessment & Plan Note (Signed)
Localized area of pain over right lateral lower rib cage evaluated with x-ray which identified nondisplaced right 10th rib fracture.  Lidoderm patches ordered for pain relief.  She was advised that the rib will repair on its own and that no operative intervention is needed.

## 2022-04-02 NOTE — Assessment & Plan Note (Signed)
A1c 5.8 today, increased from 5.5 last time it was checked (though she does have a history of prediabetes with A1c as high as 6.2).  Discussed medications and the GLP-1 agonist class and she is very interested in starting 1.  Prescribed Ozempic 0.25 mg weekly.  I am certain this will require prior authorization.

## 2022-04-02 NOTE — Assessment & Plan Note (Addendum)
Weight has increased from 329 in 11/2021, to 360 today.  Her Zoom support calls have not been as beneficial as in person visits with therapists and today she agreed to meet our behavioral health clinician Ms. [name] and they have scheduled an appointment.  We are hoping that having a more personalized approach to her mood and eating disorder will be helpful.  Noted to be prediabetic today and semaglutide will be ordered for dual indication with obesity.

## 2022-04-03 NOTE — BH Specialist Note (Signed)
Integrated Behavioral Health Initial In-Person Visit  MRN: [id number] Name: [name]  Number of [place] Clinician visits: 2- Second Visit  Session Start time: 1000    Session End time: 0814  Total time in minutes: 30   Types of Service: Individual psychotherapy and Telephone visit  Interpretor:No. Interpretor Name and Language: N/A   Warm Hand Off Completed.        Subjective: [name] is a 77 y.o. female accompanied by  self Patient was referred by PCP for Depression. Patient reports the following symptoms/concerns: Emotional Eating, depression Duration of problem: 70 years; Severity of problem: severe  Objective: Mood: Depressed and Affect: Depressed Risk of harm to self or others: No plan to harm self or others   Patient and/or Family's Strengths/Protective Factors: Sense of purpose  Goals Addressed: Patient will: Reduce symptoms of: depression Increase knowledge and/or ability of: healthy habits  Demonstrate ability to: Decrease self-medicating behaviors  Progress towards Goals: Ongoing  Interventions: Interventions utilized: Motivational Interviewing and Supportive Counseling  Standardized Assessments completed: PHQ-SADS     03/27/2022   12:05 PM 03/27/2022   11:55 AM 10/10/2021   12:01 PM  PHQ-SADS Last 3 Score only  PHQ Adolescent Score 13 3 13        Patient Centered Plan: Patient is on the following Treatment Plan(s):  Reduce symptoms of: Depression Increase knowledge and/or ability of: healthy habits  Demonstrate ability to: Decrease self-medicating behaviors  Assessment: Patient currently experiencing Depression and emotional eating. Patient admits to waking at 2 am, driving to [facility] to binge on snacks ( chips, cookies,ect). Patient states she no longer's receives a signal she is full. Patient admits to eating until sick. Patient  admits to minimum movement and only laying in bed 95% of the  day. Patient reports a beginning weight of 387 lbs. Patient reports getting her weight down to 334 lbs with the help of her PCP and Nutritionist. Patient weight is currently 360 lbs.  Patient reports having a prescription pending for Ozempic.  Patient has a pending eviction. Currently working with legal aid to assist. Patient being evicted/ housing lease not being renewed due to failing of home inspection. Patient reports having a hygiene issue as to why she doesn't go out.   Patient reports attending 10 support groups. Patient unsure if they are still benefiting her.     Patient may benefit from Peer support. [facility] will research and see if patient is eligible. Peer Support Services provided in a group setting allow the beneficiary the opportunity to engage in structured services with others that share similar recovery challenges or interest; improve or develop recovery skills; and explore community resources to assist the beneficiary in his or her recovery.  Plan: Follow up with behavioral health clinician on : within two weeks. [facility] has sent request for patient to be scheduled. Behavioral recommendations: [facility] recommended patient to start walking and working on hygiene at least once a week.   [name], MSW, [place]  [facility] Direct [phone]  Fax [phone] Main Office Phone: [phone] [address] [place], [place] [address] Website: [place], [place]

## 2022-04-08 ENCOUNTER — Telehealth: Payer: Self-pay

## 2022-04-08 NOTE — Telephone Encounter (Signed)
PA  for pt [facility] )   come through via cover my meds was submitted with last office notes from [facility]  and not PCP  notes because med is not covered for anything but  diabetes )  and last labs .... Awaiting  approval or denial ..       UPDATE :    Your information has been submitted to [facility]. [employer] will review the request and will issue a decision, typically within 3-7 days from your submission.

## 2022-04-08 NOTE — Telephone Encounter (Signed)
DECISION :      DENIED    Med is covered for diabetes  type 2

## 2022-04-10 ENCOUNTER — Ambulatory Visit (INDEPENDENT_AMBULATORY_CARE_PROVIDER_SITE_OTHER): Payer: Medicare HMO | Admitting: Licensed Clinical Social Worker

## 2022-04-10 DIAGNOSIS — F322 Major depressive disorder, single episode, severe without psychotic features: Secondary | ICD-10-CM

## 2022-04-24 NOTE — BH Specialist Note (Addendum)
Integrated Behavioral Health Follow Up In-Person Visit  MRN: [id number] Name: [name]  Number of [place] Clinician visits: 2- Second Visit  Session Start time: [id number]   Session End time: [id number]  Total time in minutes: 60   Types of Service: Individual psychotherapy and General Behavioral Integrated Care (BHI)  Interpretor:No.    Goals Addressed: Patient will:  Reduce symptoms of: depression    Progress towards Goals: Ongoing  Interventions: Interventions utilized:  Motivational Interviewing and Supportive Counseling  Assessment: Patient currently experiencing Depression and emotional eating. Patient admits to waking at 2 am, [facility] to binge on snacks ( chips, cookies,ect). Patient states she no longer's receives a signal she is full. Patient admits to eating until sick. Patient  admits to minimum movement and only laying in bed 95% of the day. Patient reports a beginning weight of 387 lbs. Patient reports getting her weight down to 334 lbs with the help of her PCP and Nutritionist. Patient weight is currently 360 lbs.      Patient reports attending 10 support groups. Patient unsure if they are still benefiting her.     Patient may benefit from Peer support. Patient discussed have a peer support person named [name] and she found Madonna very helpful.   [facility] continues to work on [employer] counseling.  Plan: Follow up with behavioral health clinician on : 04/24/2022   [name], MSW, [place]  [facility] Direct Dial:[phone]  Fax [phone] Main Office Phone: [phone] [address] [place], [place] [address] Website: [place], [place]

## 2022-05-01 ENCOUNTER — Ambulatory Visit: Payer: Medicare HMO | Admitting: Licensed Clinical Social Worker

## 2022-05-01 NOTE — Telephone Encounter (Signed)
Error

## 2022-05-07 ENCOUNTER — Ambulatory Visit (INDEPENDENT_AMBULATORY_CARE_PROVIDER_SITE_OTHER): Payer: Medicare HMO | Admitting: Licensed Clinical Social Worker

## 2022-05-07 DIAGNOSIS — F322 Major depressive disorder, single episode, severe without psychotic features: Secondary | ICD-10-CM

## 2022-05-22 NOTE — Progress Notes (Signed)
Integrated Behavioral Health via Telemedicine Visit  05/07/2022  Number of Integrated Behavioral Health Clinician visits: 3- Third Visit  Session Start time: 1300   Session End time: 1330  Total time in minutes: 30   Referring Provider: Dorian [name] Patient/Family location: Home [facility] Provider location: office All persons participating in visit: Patient and [name] Types of Service: Individual psychotherapy and General Behavioral Integrated Care (BHI)  I connected with [name] a via  Telephone or Geologist, engineering  (Video is Tree surgeon) and verified that I am speaking with the correct person using two identifiers. Discussed confidentiality: Yes   I discussed the limitations of telemedicine and the availability of in person appointments.  Discussed there is a possibility of technology failure and discussed alternative modes of communication if that failure occurs.  I discussed that engaging in this telemedicine visit, they consent to the provision of behavioral healthcare .  Patient and/or legal guardian expressed understanding and consented to Telemedicine visit: Yes    Goals Addressed: Patient will:  Reduce symptoms of: compulsions   Progress towards Goals: Ongoing  Interventions: Interventions utilized:  Motivational Interviewing, Mindfulness or Relaxation Training, and Supportive Counseling Standardized Assessments completed: PHQ-SADS     03/27/2022   12:05 PM 03/27/2022   11:55 AM 10/10/2021   12:01 PM  PHQ-SADS Last 3 Score only  PHQ Adolescent Score 13 3 13       Assessment: Patient updated [name] that she has seen her Psychiatrist and recently had a medication change. Patient feels medicine is working to suppress her appetite and control urges to binge eat. Patient is a part of emotikonal anonymous and continues to have faith.  " Let go and let GOD" is helping her to adjust.   Patient may benefit from Motivational  Interviewing, Mindfulness or Relaxation Training, and Supportive Counseling.  Plan: Follow up with behavioral health clinician on : Follow up within 30 days.   I discussed the assessment and treatment plan with the patient and/or parent/guardian. They were provided an opportunity to ask questions and all were answered. They agreed with the plan and demonstrated an understanding of the instructions.   They were advised to call back or seek an in-person evaluation if the symptoms worsen or if the condition fails to improve as anticipated. [name], MSW, [place]  [facility] Direct Dial:[phone]  Fax [phone] Main Office Phone: [phone] [address] [place],  [address] Website: [place], [place]

## 2022-06-05 ENCOUNTER — Ambulatory Visit (INDEPENDENT_AMBULATORY_CARE_PROVIDER_SITE_OTHER): Payer: Medicare HMO | Admitting: Licensed Clinical Social Worker

## 2022-06-05 DIAGNOSIS — F322 Major depressive disorder, single episode, severe without psychotic features: Secondary | ICD-10-CM

## 2022-06-05 NOTE — Progress Notes (Signed)
Patient no-showed today's appointment; appointment was for 04/04 at 1:30 pm in person visit.    Patient will need to reschedule appointment by calling Internal medicine center [phone].  [name], MSW, [place]  [facility] Direct [phone]  Fax [phone] Main Office Phone: [phone] [address] [place],  [address] Website: [place], [place]

## 2022-08-15 ENCOUNTER — Other Ambulatory Visit: Payer: Self-pay

## 2022-08-15 MED ORDER — PRAVASTATIN SODIUM 40 MG PO TABS: 40.0000 mg | ORAL_TABLET | Freq: Every day | ORAL | 3 refills | Status: DC

## 2022-08-28 ENCOUNTER — Telehealth: Payer: Self-pay | Admitting: Dietician

## 2022-08-28 ENCOUNTER — Ambulatory Visit (INDEPENDENT_AMBULATORY_CARE_PROVIDER_SITE_OTHER): Payer: Medicare HMO | Admitting: Internal Medicine

## 2022-08-28 VITALS — BP 120/80 | HR 82 | Temp 98.2°F | Ht 68.0 in | Wt 373.7 lb

## 2022-08-28 DIAGNOSIS — R7303 Prediabetes: Secondary | ICD-10-CM

## 2022-08-28 DIAGNOSIS — Z79899 Other long term (current) drug therapy: Secondary | ICD-10-CM

## 2022-08-28 DIAGNOSIS — I10 Essential (primary) hypertension: Secondary | ICD-10-CM | POA: Diagnosis not present

## 2022-08-28 DIAGNOSIS — Z6841 Body Mass Index (BMI) 40.0 and over, adult: Secondary | ICD-10-CM

## 2022-08-28 DIAGNOSIS — J432 Centrilobular emphysema: Secondary | ICD-10-CM

## 2022-08-28 DIAGNOSIS — R0609 Other forms of dyspnea: Secondary | ICD-10-CM | POA: Diagnosis not present

## 2022-08-28 DIAGNOSIS — F322 Major depressive disorder, single episode, severe without psychotic features: Secondary | ICD-10-CM

## 2022-08-28 DIAGNOSIS — Z8679 Personal history of other diseases of the circulatory system: Secondary | ICD-10-CM

## 2022-08-28 DIAGNOSIS — G4733 Obstructive sleep apnea (adult) (pediatric): Secondary | ICD-10-CM

## 2022-08-28 DIAGNOSIS — E7841 Elevated Lipoprotein(a): Secondary | ICD-10-CM

## 2022-08-28 NOTE — Telephone Encounter (Signed)
Call to patient to offer follow up Medical Nutrition Therapy. She states she is not ready at this time.

## 2022-08-28 NOTE — Progress Notes (Signed)
77 year old [name] (history of resolved hypertension; major depression managed by psychiatry; morbid obesity) is here concerned about gradual development of shortness of breath primarily with exertion and accompanied by wheezing as well as low oxygen levels (60s to 90s on her monitor).  She has been feeling lightheaded when getting up.  Retaining fluid in her ankles and questions if salt is related (heavy salt intake through frequent snacks).  No chest pain or cough.  Able to lay down in the bed without orthopnea.  Using her CPAP at night.  Unfortunately she has been gaining back the weight she had lost due to emotional eating, something she has battled for years.  She continues to be under stress with problems related to her apartment.  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    On ROS, she reports that her vision has declined (decreased acuity)-she had an eye visit fairly recently and was diagnosed with nearsightedness for which eyeglass prescription was provided.  She has not filled this yet as she was not sure what nearsightedness meant.  Her new Solifenacin for urinary incontinence is helping minimally.   BP 120/80 (BP Location: Right Arm, Patient Position: Sitting, Cuff Size: Large)   Pulse 82   Temp 98.2 F (36.8 C) (Oral)   Ht 5\' 8"  (1.727 m)   Wt (!) 373 lb 11.2 oz (169.5 kg)   SpO2 95%   BMI 56.82 kg/m  Nicely dressed and groomed and pleasant as always.  No JVD, lungs with distant breathsounds and no audible wheezing or rales.  Heart RRR no significant murmur.  Feet and ankles are puffy, approx 2+ pitting. Very mild dyspnea during speech. She is noticeably dyspneic during slow walk.  Assessment and plan: Exertional dyspnea with hypoxia Has gained weight back and is feeling very short of breath with getting out of bed and changing positions.  No associated chest  pain, no new swelling, able to lie down comfortably (though lies primarily on her side).  She has noted wheezing.  No cough.  Her "cheap" - her words - pOx has been reading 60s-90, never higher.  Lungs with distant breath sounds, no wheezing at this time.  No JVD.  Feet are puffy.    Patient Saturations on Room Air at Rest = 93%  Patient Saturations on [employer] while Ambulating = 86%  Patient Saturations on 2 Liters of oxygen while Ambulating = 94% O2 sat with ambulation, recovers to with a few moment of rest.    Plan - order home 02 for use with activity.  We discussed any role for echocardiogram - last one was poor quality study due to habitus.  Suspect she has mixed restrictive (from recent weight gain) and obstructive (carried dx of emphysema) pulmonary disease as well as possible obesity hypoventilation syndrome (VBG not done).  Weight loss is primary goal.  Check CBC to ensure no anemia contributing. VBG may be needed in the future to determine if bipap needed.  Prediabetes Rechecked - increased to 5.8 to 6.1. Trail mix, cookies, and cheetoes are frequent snacks.  Changing her eating habits has been very challenging, as her eating is emotional.   Class 3 severe obesity due to excess calories with serious comorbidity and body mass index (BMI) of 50.0 to 59.9 in adult [facility]) 40# weight gain regained since 11/2021 due to emotional eating habits. She did not  find therapy with our clinician beneficial, though admits that she didn't give it much of a chance.  Concerned that her  was causing weight gain, she asked her psychiatrist to change it - she is now taking Pristiq.  Trail mix and sweets and snack foods are greatest problem. She understands that emotional aspect but hasn't found a way to break the cycle.  We investigated GLP1s for weight loss and for prediabetes - they are not covered by [facility].    Severe major depression (HCC) Managed by psychiatrist, she has changed from Trintellix to  desvenlafaxine/Pristiq. Mood is fair. She is despondent, though, about battle with weight. No SI.   OSA (obstructive sleep apnea) Endorses adherence to nightly CPAP.   History of hypertension 120/80 without medication.   Monitor.    Chronic use of benzodiazepine for therapeutic purpose Discussed recommendation to gradually titrate off this medicine which she takes to help go to sleep.  She is very worried about withdrawal.  I advised her of the risks of ongoing benzos (falls, cognitive decline) and we will revisit this next visit.

## 2022-08-28 NOTE — Patient Instructions (Addendum)
Ms. Bertucci,  I'm concerned about your breathing, and today we are ordering home oxygen for when you saturations are < 88% when you're up and about.  We are checking your kidney function, cholesterol, and blood counts today.  I will call you with the results.  In the meantime I'll check into your insurance to see which of the injectable pre-diabetes medicines will be covered.  Do your best to watch salt and calorie intake.  It is an overwhelming challenge, to be sure.  We are holding off on repeating an echocardiogram and returning to pulmonologist at this time for the reasons we discussed.  I'm so proud of your efforts to get involved with community service work!  I know you'll be great!  See you in about 3 months, earlier if needed.  Take care and stay well,  Dr. Mayford Knife

## 2022-08-28 NOTE — Progress Notes (Signed)
SATURATION QUALIFICATIONS: (This note is used to comply with regulatory documentation for home oxygen)  Patient Saturations on Room Air at Rest = 93%  Patient Saturations on Room Air while Ambulating = 86%  Patient Saturations on 2 Liters of oxygen while Ambulating = 94%  Please briefly explain why patient needs home oxygen:See Dr, note

## 2022-08-30 LAB — BMP8+ANION GAP
Anion Gap: 17 mmol/L (ref 10.0–18.0)
BUN/Creatinine Ratio: 15 (ref 12–28)
BUN: 14 mg/dL (ref 8–27)
CO2: 20 mmol/L (ref 20–29)
Calcium: 9.6 mg/dL (ref 8.7–10.3)
Chloride: 101 mmol/L (ref 96–106)
Creatinine, Ser: 0.96 mg/dL (ref 0.57–1.00)
Glucose: 105 mg/dL — ABNORMAL HIGH (ref 70–99)
Potassium: 4.2 mmol/L (ref 3.5–5.2)
Sodium: 138 mmol/L (ref 134–144)
eGFR: 62 mL/min/{1.73_m2} (ref >59)

## 2022-08-30 LAB — CBC WITH DIFFERENTIAL/PLATELET
Basophils Absolute: 0 10*3/uL (ref 0.0–0.2)
Basos: 1 %
EOS (ABSOLUTE): 0.1 10*3/uL (ref 0.0–0.4)
Eos: 1 %
Hematocrit: 46.2 % (ref 34.0–46.6)
Hemoglobin: 15.1 g/dL (ref 11.1–15.9)
Immature Grans (Abs): 0 10*3/uL (ref 0.0–0.1)
Immature Granulocytes: 0 %
Lymphocytes Absolute: 1.6 10*3/uL (ref 0.7–3.1)
Lymphs: 28 %
MCH: 26.4 pg — ABNORMAL LOW (ref 26.6–33.0)
MCHC: 32.7 g/dL (ref 31.5–35.7)
MCV: 81 fL (ref 79–97)
Monocytes Absolute: 0.4 10*3/uL (ref 0.1–0.9)
Monocytes: 7 %
Neutrophils Absolute: 3.8 10*3/uL (ref 1.4–7.0)
Neutrophils: 63 %
Platelets: 230 10*3/uL (ref 150–450)
RBC: 5.71 x10E6/uL — ABNORMAL HIGH (ref 3.77–5.28)
RDW: 14.2 % (ref 11.7–15.4)
WBC: 5.9 10*3/uL (ref 3.4–10.8)

## 2022-08-30 LAB — LIPID PANEL
Chol/HDL Ratio: 3.7 ratio (ref 0.0–4.4)
Cholesterol, Total: 163 mg/dL (ref 100–199)
HDL: 44 mg/dL (ref >39)
LDL Chol Calc (NIH): 102 mg/dL — ABNORMAL HIGH (ref 0–99)
Triglycerides: 91 mg/dL (ref 0–149)
VLDL Cholesterol Cal: 17 mg/dL (ref 5–40)

## 2022-08-30 LAB — HEMOGLOBIN A1C
Est. average glucose Bld gHb Est-mCnc: 128 mg/dL
Hgb A1c MFr Bld: 6.1 % — ABNORMAL HIGH (ref 4.8–5.6)

## 2022-09-09 ENCOUNTER — Other Ambulatory Visit (HOSPITAL_COMMUNITY): Payer: Self-pay

## 2022-09-09 ENCOUNTER — Telehealth: Payer: Self-pay

## 2022-09-09 DIAGNOSIS — Z79899 Other long term (current) drug therapy: Secondary | ICD-10-CM | POA: Insufficient documentation

## 2022-09-09 NOTE — Assessment & Plan Note (Signed)
Endorses adherence to nightly CPAP.

## 2022-09-09 NOTE — Assessment & Plan Note (Addendum)
120/80 without medication.   Monitor.

## 2022-09-09 NOTE — Assessment & Plan Note (Addendum)
Has gained weight back and is feeling very short of breath with getting out of bed and changing positions.  No associated chest pain, no new swelling, able to lay down comfortable (though lies primarily on her side).  She has noted wheezing.  No cough.  Her "cheap" - her words - pOx has been reading 60s-90, never higher.  Lungs with distant breath sounds, no wheezing at this time.  No JVD.  Feet are puffy.    Patient Saturations on Room Air at Rest = 93%  Patient Saturations on [employer] while Ambulating = 86%  Patient Saturations on 2 Liters of oxygen while Ambulating = 94% O2 sat with ambulation, recovers to with a few moment of rest.    Plan - order home 02 for use with activity.  We discussed any role for echocardiogram - last one was poor quality study due to habitus.  Suspect she has mixed restrictive (from recent weight gain) and obstructive (carried dx of emphysema) pulmonary disease as well as possible obesity hypoventilation syndrome (VBG not done).  Weight loss is primary goal.

## 2022-09-09 NOTE — Telephone Encounter (Signed)
Done

## 2022-09-09 NOTE — Assessment & Plan Note (Signed)
40# weight gain regained since 11/2021 due to emotional eating habits. She did not find therapy with our clinician beneficial, though admits that she didn't give it much of a chance.  Concerned that her  was causing weight gain, she asked her psychiatrist to change it - she is now taking Pristiq.  Trail mix and sweets and snack foods are greatest problem. She understands that emotional aspect but hasn't found a way to break the cycle.  We investigated GLP1s for weight loss and for prediabetes - they are not covered by [facility].

## 2022-09-09 NOTE — Assessment & Plan Note (Addendum)
Managed by psychiatrist, she has changed from Trintellix to desvenlafaxine/Pristiq. Mood is fair. She is despondent, though, about battle with weight. No SI.

## 2022-09-09 NOTE — Assessment & Plan Note (Signed)
Rechecked - increased to 5.8 to 6.1. Trail mix, cookies, and cheetoes are frequent snacks.  Changing her eating habits has been very challenging, as her eating is emotional.

## 2022-09-09 NOTE — Progress Notes (Signed)
77 year old [name] (history of resolved hypertension; major depression managed by psychiatry; morbid obesity) is here concerned about gradual development of shortness of breath primarily with exertion and accompanied by wheezing as well as low oxygen levels (60s to 90s on her monitor).  She has been feeling lightheaded when getting up.  Retaining fluid in her ankles and questions if salt is related (heavy salt intake through frequent snacks).  No chest pain or cough.  Able to lay down in the bed without orthopnea.  Using her CPAP at night.  Unfortunately she has been gaining back the weight she had lost due to emotional eating, something she has battled for years.  She continues to be under stress with problems related to her apartment.  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     On ROS, she reports that her vision has declined (decreased acuity)-she had an eye visit fairly recently and was diagnosed with nearsightedness for which eyeglass prescription was provided.  She has not filled this yet as she was not sure what nearsightedness meant.  Her new Solifenacin for urinary incontinence is helping minimally.   BP 120/80 (BP Location: Right Arm, Patient Position: Sitting, Cuff Size: Large)   Pulse 82   Temp 98.2 F (36.8 C) (Oral)   Ht 5\' 8"  (1.727 m)   Wt (!) 373 lb 11.2 oz (169.5 kg)   SpO2 95%   BMI 56.82 kg/m  Nicely dressed and groomed and pleasant as always.  No JVD, lungs with distant breathsounds and no audible wheezing or rales.  Heart RRR no significant murmur.  Feet and ankles are puffy, approx 2+ pitting. Very mild dyspnea during speech. She is noticeably dyspneic during slow walk.   Assessment and plan: Exertional dyspnea with hypoxia Has gained weight back and is feeling very short of breath with getting out of bed and changing positions.  No associated chest  pain, no new swelling, able to lie down comfortably (though lies primarily on her side).  She has noted wheezing.  No cough.  Her "cheap" - her words - pOx has been reading 60s-90, never higher.  Lungs with distant breath sounds, no wheezing at this time.  No JVD.  Feet are puffy.    Patient Saturations on Room Air at Rest = 93%  Patient Saturations on [employer] while Ambulating = 86%  Patient Saturations on 2 Liters of oxygen while Ambulating = 94% O2 sat with ambulation, recovers to with a few moment of rest.     Plan - order home 02 for use with activity.  We discussed any role for echocardiogram - last one was poor quality study due to habitus.  Suspect she has mixed restrictive (from recent weight gain) and obstructive (carried dx of emphysema) pulmonary disease as well as possible obesity hypoventilation syndrome (VBG not done).  Weight loss is primary goal.  Check CBC to ensure no anemia contributing. VBG may be needed in the future to determine if bipap needed.   Prediabetes Rechecked - increased to 5.8 to 6.1. Trail mix, cookies, and cheetoes are frequent snacks.  Changing her eating habits has been very challenging, as her eating is emotional.    Class 3 severe obesity due to excess calories with serious comorbidity and body mass index (BMI) of 50.0 to 59.9 in adult [facility]) 40# weight gain regained since 11/2021 due to emotional  eating habits. She did not find therapy with our clinician beneficial, though admits that she didn't give it much of a chance.  Concerned that her  was causing weight gain, she asked her psychiatrist to change it - she is now taking Pristiq.  Trail mix and sweets and snack foods are greatest problem. She understands that emotional aspect but hasn't found a way to break the cycle.  We investigated GLP1s for weight loss and for prediabetes - they are not covered by [facility].     Severe major depression (HCC) Managed by psychiatrist, she has changed from Trintellix  to desvenlafaxine/Pristiq. Mood is fair. She is despondent, though, about battle with weight. No SI.    OSA (obstructive sleep apnea) Endorses adherence to nightly CPAP.    History of hypertension 120/80 without medication.   Monitor.     Chronic use of benzodiazepine for therapeutic purpose Discussed recommendation to gradually titrate off this medicine which she takes to help go to sleep.  She is very worried about withdrawal.  I advised her of the risks of ongoing benzos (falls, cognitive decline) and we will revisit this next visit.

## 2022-09-09 NOTE — Telephone Encounter (Signed)
Pt is requesting a call back .[name] She stated that her PCP  stated at her last office visit she was going to call her with her lab results .[name] But she has heard nothing .[name] Also she was to place an order for O2 for pt but she has heard nothing about that either

## 2022-09-09 NOTE — Assessment & Plan Note (Signed)
Discussed recommendation to gradually titrate off this medicine which she takes to help go to sleep.  She is very worried about withdrawal.  I advised her of the risks of ongoing benzos (falls, cognitive decline) and we will revisit this next visit.

## 2022-09-10 ENCOUNTER — Ambulatory Visit (INDEPENDENT_AMBULATORY_CARE_PROVIDER_SITE_OTHER): Payer: Medicare HMO

## 2022-09-10 VITALS — Ht 68.0 in | Wt 373.0 lb

## 2022-09-10 DIAGNOSIS — Z0000 Encounter for general adult medical examination without abnormal findings: Secondary | ICD-10-CM

## 2022-09-10 NOTE — Patient Instructions (Signed)
[name] , Thank you for taking time to come for your Medicare Wellness Visit. I appreciate your ongoing commitment to your health goals. Please review the following plan we discussed and let me know if I can assist you in the future.   These are the goals we discussed:  Goals       Weight (lb) < 200 lb (90.7 kg) (pt-stated)      Would like to lose some so she can breath better.        This is a list of the screening recommended for you and due dates:  Health Maintenance  Topic Date Due   Zoster (Shingles) Vaccine (1 of 2) Never done   Flu Shot  10/02/2022   Colon Cancer Screening  04/30/2023   Medicare Annual Wellness Visit  09/10/2023   Colon Cancer Screening  04/29/2028   DTaP/Tdap/Td vaccine (3 - Td or Tdap) 03/15/2030   Pneumonia Vaccine  Completed   DEXA scan (bone density measurement)  Completed   Hepatitis C Screening  Completed   HPV Vaccine  Aged Out   Stool Blood Test  Discontinued   COVID-19 Vaccine  Discontinued    Advanced directives: Copy on file.  Conditions/risks identified: Aim to try and move more around the house. Also drink 6-8 glasses of water, and 5 servings of fruits and vegetables each day.   Please call the office for next year's AWV.  Next appointment: Follow up in one year for your annual wellness visit    Preventive Care 65 Years and Older, Female Preventive care refers to lifestyle choices and visits with your health care provider that can promote health and wellness. What does preventive care include? A yearly physical exam. This is also called an annual well check. Dental exams once or twice a year. Routine eye exams. Ask your health care provider how often you should have your eyes checked. Personal lifestyle choices, including: Daily care of your teeth and gums. Regular physical activity. Eating a healthy diet. Avoiding tobacco and drug use. Limiting alcohol use. Practicing safe sex. Taking low-dose aspirin every day. Taking  vitamin and mineral supplements as recommended by your health care provider. What happens during an annual well check? The services and screenings done by your health care provider during your annual well check will depend on your age, overall health, lifestyle risk factors, and family history of disease. Counseling  Your health care provider may ask you questions about your: Alcohol use. Tobacco use. Drug use. Emotional well-being. Home and relationship well-being. Sexual activity. Eating habits. History of falls. Memory and ability to understand (cognition). Work and work Astronomer. Reproductive health. Screening  You may have the following tests or measurements: Height, weight, and BMI. Blood pressure. Lipid and cholesterol levels. These may be checked every 5 years, or more frequently if you are over 40 years old. Skin check. Lung cancer screening. You may have this screening every year starting at age 70 if you have a 30-pack-year history of smoking and currently smoke or have quit within the past 15 years. Fecal occult blood test (FOBT) of the stool. You may have this test every year starting at age 52. Flexible sigmoidoscopy or colonoscopy. You may have a sigmoidoscopy every 5 years or a colonoscopy every 10 years starting at age 30. Hepatitis C blood test. Hepatitis B blood test. Sexually transmitted disease (STD) testing. Diabetes screening. This is done by checking your blood sugar (glucose) after you have not eaten for a while (fasting). You  may have this done every 1-3 years. Bone density scan. This is done to screen for osteoporosis. You may have this done starting at age 45. Mammogram. This may be done every 1-2 years. Talk to your health care provider about how often you should have regular mammograms. Talk with your health care provider about your test results, treatment options, and if necessary, the need for more tests. Vaccines  Your health care provider may  recommend certain vaccines, such as: Influenza vaccine. This is recommended every year. Tetanus, diphtheria, and acellular pertussis (Tdap, Td) vaccine. You may need a Td booster every 10 years. Zoster vaccine. You may need this after age 31. Pneumococcal 13-valent conjugate (PCV13) vaccine. One dose is recommended after age 62. Pneumococcal polysaccharide (PPSV23) vaccine. One dose is recommended after age 75. Talk to your health care provider about which screenings and vaccines you need and how often you need them. This information is not intended to replace advice given to you by your health care provider. Make sure you discuss any questions you have with your health care provider. Document Released: 03/16/2015 Document Revised: 11/07/2015 Document Reviewed: 12/19/2014 Elsevier Interactive Patient Education  2017 [employer].  Fall Prevention in the Home Falls can cause injuries. They can happen to people of all ages. There are many things you can do to make your home safe and to help prevent falls. What can I do on the outside of my home? Regularly fix the edges of walkways and driveways and fix any cracks. Remove anything that might make you trip as you walk through a door, such as a raised step or threshold. Trim any bushes or trees on the path to your home. Use bright outdoor lighting. Clear any walking paths of anything that might make someone trip, such as rocks or tools. Regularly check to see if handrails are loose or broken. Make sure that both sides of any steps have handrails. Any raised decks and porches should have guardrails on the edges. Have any leaves, snow, or ice cleared regularly. Use sand or salt on walking paths during winter. Clean up any spills in your garage right away. This includes oil or grease spills. What can I do in the bathroom? Use night lights. Install grab bars by the toilet and in the tub and shower. Do not use towel bars as grab bars. Use non-skid  mats or decals in the tub or shower. If you need to sit down in the shower, use a plastic, non-slip stool. Keep the floor dry. Clean up any water that spills on the floor as soon as it happens. Remove soap buildup in the tub or shower regularly. Attach bath mats securely with double-sided non-slip rug tape. Do not have throw rugs and other things on the floor that can make you trip. What can I do in the bedroom? Use night lights. Make sure that you have a light by your bed that is easy to reach. Do not use any sheets or blankets that are too big for your bed. They should not hang down onto the floor. Have a firm chair that has side arms. You can use this for support while you get dressed. Do not have throw rugs and other things on the floor that can make you trip. What can I do in the kitchen? Clean up any spills right away. Avoid walking on wet floors. Keep items that you use a lot in easy-to-reach places. If you need to reach something above you, use a strong  step stool that has a grab bar. Keep electrical cords out of the way. Do not use floor polish or wax that makes floors slippery. If you must use wax, use non-skid floor wax. Do not have throw rugs and other things on the floor that can make you trip. What can I do with my stairs? Do not leave any items on the stairs. Make sure that there are handrails on both sides of the stairs and use them. Fix handrails that are broken or loose. Make sure that handrails are as long as the stairways. Check any carpeting to make sure that it is firmly attached to the stairs. Fix any carpet that is loose or worn. Avoid having throw rugs at the top or bottom of the stairs. If you do have throw rugs, attach them to the floor with carpet tape. Make sure that you have a light switch at the top of the stairs and the bottom of the stairs. If you do not have them, ask someone to add them for you. What else can I do to help prevent falls? Wear shoes  that: Do not have high heels. Have rubber bottoms. Are comfortable and fit you well. Are closed at the toe. Do not wear sandals. If you use a stepladder: Make sure that it is fully opened. Do not climb a closed stepladder. Make sure that both sides of the stepladder are locked into place. Ask someone to hold it for you, if possible. Clearly mark and make sure that you can see: Any grab bars or handrails. First and last steps. Where the edge of each step is. Use tools that help you move around (mobility aids) if they are needed. These include: Canes. Walkers. Scooters. Crutches. Turn on the lights when you go into a dark area. Replace any light bulbs as soon as they burn out. Set up your furniture so you have a clear path. Avoid moving your furniture around. If any of your floors are uneven, fix them. If there are any pets around you, be aware of where they are. Review your medicines with your doctor. Some medicines can make you feel dizzy. This can increase your chance of falling. Ask your doctor what other things that you can do to help prevent falls. This information is not intended to replace advice given to you by your health care provider. Make sure you discuss any questions you have with your health care provider. Document Released: 12/14/2008 Document Revised: 07/26/2015 Document Reviewed: 03/24/2014 Elsevier Interactive Patient Education  2017 [employer].

## 2022-09-10 NOTE — Progress Notes (Cosign Needed Addendum)
Subjective:   [name] is a 77 y.o. female who presents for Medicare Annual (Subsequent) preventive examination.  Visit Complete: Virtual  I connected with  [name] on 09/10/22 by a audio enabled telemedicine application and verified that I am speaking with the correct person using two identifiers.  Patient Location: Home  Provider Location: Home Office  I discussed the limitations of evaluation and management by telemedicine. The patient expressed understanding and agreed to proceed.  Review of Systems    Cardiac Risk Factors include: advanced age (>102men, >5 women);dyslipidemia;obesity (BMI >30kg/m2);Other (see comment), Risk factor comments: ABD atherosclerosis aorta.     Objective:    Today's Vitals   09/10/22 1301  Weight: (!) 373 lb (169.2 kg)  Height: 5\' 8"  (1.727 m)   Body mass index is 56.71 kg/m.     09/10/2022    1:18 PM 08/28/2022   10:43 AM 10/10/2021   11:06 AM 07/25/2021   10:43 AM 06/04/2021    3:58 PM 01/14/2021    4:49 PM 09/27/2020    9:52 AM  Advanced Directives  Does Patient Have a Medical Advance Directive? Yes Yes Yes Yes Yes No Yes  Type of Estate agent of [place];Living will Healthcare Power of [place];Living will Healthcare Power of [place];Living will Healthcare Power of [place];Living will Healthcare Power of [place];Living will  Healthcare Power of [place];Living will  Does patient want to make changes to medical advance directive?  No - Patient declined  No - Patient declined   No - Patient declined  Copy of Healthcare Power of Attorney in Chart? No - copy requested Yes - validated most recent copy scanned in chart (See row information) Yes - validated most recent copy scanned in chart (See row information) Yes - validated most recent copy scanned in chart (See row information) Yes - validated most recent copy scanned in chart (See row information)  Yes - validated most recent copy scanned  in chart (See row information)  Would patient like information on creating a medical advance directive?      No - Patient declined     Current Medications (verified) Outpatient Encounter Medications as of 09/10/2022  Medication Sig   ALPRAZolam (XANAX) 1 MG tablet Take 1 tablet by mouth at bedtime.   desvenlafaxine (PRISTIQ) 100 MG 24 hr tablet Take 100 mg by mouth every morning.   diclofenac Sodium (VOLTAREN) 1 % GEL APPLY 4 G TOPICALLY 4 TIMES DAILY   Multiple Vitamin (MULTIVITAMIN) tablet Take 1 tablet by mouth daily. (Patient taking differently: Take 1 tablet by mouth every evening.)   Omega-3 Fatty Acids (OMEGA-3 FISH OIL PO) Take 1 capsule by mouth every evening.    pravastatin (PRAVACHOL) 40 MG tablet Take 1 tablet (40 mg total) by mouth daily.   rivaroxaban (XARELTO) 20 MG TABS tablet TAKE 1 TABLET EVERY DAY WITH SUPPER   solifenacin (VESICARE) 5 MG tablet Take 5 mg by mouth daily.   [DISCONTINUED] amLODipine (NORVASC) 5 MG tablet Take 1 tablet (5 mg total) by mouth daily.   No facility-administered encounter medications on file as of 09/10/2022.    Allergies (verified) Lisinopril   History: Past Medical History:  Diagnosis Date   Arthritis    osteoarthritis , kness.   Chronic hypoxemic respiratory failure (HCC) 11/18/2013   8/26 CTA >>> extensive b/l PE's with evidence of right heart strain c/w at least submassive PE.  8/26 Echo >>> EF 55%, grade 1 diastolic dysfx, mod/severe RV dilation with mod RV  systolic dysfx, PAS 51 mmHg  8/27 LE Dopplers >>>negative 01/2014 Echo> EF 60%, RV "upper limits of normal", cannot estimate RVSP    Chronic kidney disease    Renal insuffucinecy, cr-1.33 in 10/2005.   Depression    followed by Dr. Evelene Croon   Diverticulosis of colon    Embolism and thrombosis of unspecified artery (HCC) 01/11/2020   Fibrocystic breast disease    History of hepatitis B    History of pulmonary embolism 01/2002   Hyperlipidemia    Hypertension    Morbid obesity  with body mass index (BMI) of 50.0 to 59.9 in adult [facility]) 01/19/2006        Neck nodule    not erythematous, movable like cyst located at the base of posterior neck on the left side(not painful), will need follow up for    Obesity    Obstructive sleep apnea    cpap    Pulmonary nodule    Stable on repeat imaging. LLL nodule 5 mm   Vasovagal near-syncope 06/28/2020   Acute episode around 10:30 am when preparing to leave today's visit.  Had been sitting in chair for 2 hours; minimal sleep night prior; last meal was popcorn and sips of water around 1 am.  "I feel dizzy" she noted while sitting - recheck BP not initially measurable, pulses faint, became cool and  diaphoretic and minimally verbally responsive though did not completely lose consciousness.  Denied d   Past Surgical History:  Procedure Laterality Date   CHOLECYSTECTOMY N/A 08/09/2015   Procedure: LAPAROSCOPIC CHOLECYSTECTOMY WITH INTRAOPERATIVE CHOLANGIOGRAM;  Surgeon: Harriette Bouillon, MD;  Location: [facility] OR;  Service: General;  Laterality: N/A;   FLEXIBLE SIGMOIDOSCOPY N/A 04/29/2018   Procedure: Arnell Sieving;  Surgeon: Carman Ching, MD;  Location: [facility]COPY;  Service: Endoscopy;  Laterality: N/A;   GYN surgery     s/p excison of vulvar cyst 10/18/04 by dr. [name]   Family History  Problem Relation Age of Onset   Hypertension Other        family history   Kidney disease Maternal Uncle    Parkinson's disease Brother    Hypertension Mother    Social History   Socioeconomic History   Marital status: Widowed    Spouse name: Not on file   Number of children: Not on file   Years of education: Not on file   Highest education level: Not on file  Occupational History   Occupation: Retired   Occupation: Disabled  Tobacco Use   Smoking status: Former    Packs/day: 1.00    Years: 20.00    Additional pack years: 0.00    Total pack years: 20.00    Types: Cigarettes    Quit date: 03/08/1984    Years since  quitting: 38.5   Smokeless tobacco: Never  Vaping Use   Vaping Use: Never used  Substance and Sexual Activity   Alcohol use: No    Alcohol/week: 0.0 standard drinks of alcohol   Drug use: No   Sexual activity: Not Currently  Other Topics Concern   Not on file  Social History Narrative   Grew up in government housing. Received college education, had career Education officer, community, Veterinary surgeon, others), and own home before depression dx. Now back in government housing.    Started smoking in her teens and quit in her 29s.  She never smoked more than 1 pack/day.  Likely, less than a 20-pack-year history      Current Social History 07/19/2020  Patient lives alone in a ground floor apartment which is 1 story. There are not steps up to the entrance the patient uses.       Patient's method of transportation is personal car.      The highest level of education was Bachelor's Degree.      The patient currently retired      Identified important Relationships are "I talk with my sister in [place] every night."       Pets : "I had to put down my 44.5 yo dog in July 2020." Made a goal to look into fostering a pet.       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[place], BSN, RN-BC        Social Determinants of Health   Financial Resource Strain: Low Risk  (09/10/2022)   Overall Financial Resource Strain (CARDIA)    Difficulty of Paying Living Expenses: Not very hard  Food Insecurity: No Food Insecurity (09/10/2022)   Hunger Vital Sign    Worried About Running Out of Food in the Last Year: Never true    Ran Out of Food in the Last Year: Never true  Transportation Needs: No Transportation Needs (09/10/2022)   PRAPARE - Administrator, Civil Service (Medical): No    Lack of Transportation (Non-Medical): No  Physical Activity: Inactive (09/10/2022)    Exercise Vital Sign    Days of Exercise per Week: 0 days    Minutes of Exercise per Session: 0 min  Stress: No Stress Concern Present (09/10/2022)   [employer] of Occupational Health - Occupational Stress Questionnaire    Feeling of Stress : Not at all  Social Connections: Socially Isolated (09/10/2022)   Social Connection and Isolation Panel [NHANES]    Frequency of Communication with Friends and Family: More than three times a week    Frequency of Social Gatherings with Friends and Family: Never    Attends Religious Services: Never    Database administrator or Organizations: No    Attends Banker Meetings: Never    Marital Status: Widowed    Tobacco Counseling Counseling given: Not Answered   Clinical Intake:  Pre-visit preparation completed: Yes  Pain : No/denies pain     BMI - recorded: 56.71 Nutritional Status: BMI > 30  Obese Nutritional Risks: None Diabetes: Yes (Prediabetic) CBG done?: No Did pt. bring in CBG monitor from home?: No     Interpreter Needed?: No  Information entered by :: Safir Michalec, RMA   Activities of Daily Living    09/10/2022    1:07 PM 08/28/2022   10:43 AM  In your present state of health, do you have any difficulty performing the following activities:  Hearing? 0 0  Vision? 1 0  Comment When driving.  Has not picked up her glasses yet.   Difficulty concentrating or making decisions? 0 0  Walking or climbing stairs? 1 1  Dressing or bathing? 1 0  Comment Has a aide to help her bath and dress.   Doing errands, shopping? 1 0  Comment Still drives but not often   Preparing Food and eating ? N   Using the Toilet? N   In the past six months, have you accidently leaked urine? Jeannie Fend  Comment Per patient-has incontenance.  Wears briefs.   Do you have problems with loss of bowel control? Y   Managing your Finances? N   Housekeeping or managing your Housekeeping? Y     Patient Care Team: Miguel Aschoff, MD as  PCP - General (Internal Medicine) Sallye Lat, MD as Consulting Physician (Ophthalmology) Plyler, Cecil Cranker, RD as Dietitian (Dietician) Alfredo Martinez, MD as Consulting Physician (Urology) Zena Amos, MD as Consulting Physician (Psychiatry)  Indicate any recent Medical Services you may have received from other than [facility] providers in the past year (date may be approximate).     Assessment:   This is a routine wellness examination for [name].  Hearing/Vision screen Hearing Screening - Comments:: Denies hearing difficulties    Dietary issues and exercise activities discussed:     Goals Addressed               This Visit's Progress     Weight (lb) < 200 lb (90.7 kg) (pt-stated)   373 lb (169.2 kg)     Would like to lose some so she can breath better.      Depression Screen    09/10/2022    1:24 PM 03/27/2022   12:05 PM 03/27/2022   11:55 AM 10/10/2021   12:01 PM 07/25/2021   10:37 AM 09/27/2020   11:16 AM 07/19/2020    2:49 PM  PHQ 2/9 Scores  PHQ - 2 Score 6 5 3 5 4  0 5  PHQ- 9 Score 14 13  13 13  15     Fall Risk    09/10/2022    1:18 PM 08/28/2022   10:43 AM 03/27/2022   10:58 AM 10/10/2021   12:00 PM 07/25/2021   10:36 AM  Fall Risk   Falls in the past year? 1 0 1 0 0  Number falls in past yr: 0 0 1 0 0  Injury with Fall? 1 0 1  0  Comment per patient, rib fracture.      Risk for fall due to : Impaired mobility   Impaired balance/gait;Impaired mobility   Follow up Falls evaluation completed;Falls prevention discussed Falls evaluation completed Falls evaluation completed Falls evaluation completed Falls evaluation completed    MEDICARE RISK AT HOME:  Medicare Risk at Home - 09/10/22 1627     Any stairs in or around the home? No    If so, are there any without handrails? No    Home free of loose throw rugs in walkways, pet beds, electrical cords, etc? Yes    Use of a cane, walker or w/c? Yes    Grab bars in the bathroom? Yes              TIMED UP AND GO:  Was the test performed?  No    Cognitive Function:        09/10/2022    1:20 PM  6CIT Screen  What Year? 0 points  What month? 0 points  What time? 0 points  Count back from 20 0 points  Months in reverse 0 points  Repeat phrase 0 points  Total Score 0 points    Immunizations Immunization History  Administered Date(s) Administered   Influenza Split 12/25/2010   Influenza Whole 01/15/2009, 11/26/2009   Influenza, Seasonal, Injecte, Preservative Fre 01/27/2012   Influenza,inj,Quad PF,6+ Mos 12/27/2012, 11/14/2013, 04/05/2015, 11/22/2015, 11/28/2016, 02/04/2018, 01/20/2019, 01/05/2020   PFIZER(Purple Top)SARS-COV-2 Vaccination 05/30/2019, 06/22/2019, 04/10/2020   PPD Test 02/04/2019   Pneumococcal Conjugate-13 07/27/2014   Pneumococcal  Polysaccharide-23 12/27/2012   Td 01/15/2009   Tdap 03/15/2020    TDAP status: Up to date  Flu Vaccine status: Up to date12/20/2023, [facility]  Pneumococcal vaccine status: Up to date  Covid-19 vaccine status: Completed vaccines  Qualifies for Shingles Vaccine? Yes   Zostavax completed No   Shingrix Completed?: No.    Education has been provided regarding the importance of this vaccine. Patient has been advised to call insurance company to determine out of pocket expense if they have not yet received this vaccine. Advised may also receive vaccine at local pharmacy or Health Dept. Verbalized acceptance and understanding.  Screening Tests Health Maintenance  Topic Date Due   Zoster Vaccines- Shingrix (1 of 2) Never done   INFLUENZA VACCINE  10/02/2022   COLON CANCER SCREENING 5 YEAR SIGMOIDOSCOPY  04/30/2023   Medicare Annual Wellness (AWV)  09/10/2023   Colonoscopy  04/29/2028   DTaP/Tdap/Td (3 - Td or Tdap) 03/15/2030   Pneumonia Vaccine 35+ Years old  Completed   DEXA SCAN  Completed   Hepatitis C Screening  Completed   HPV VACCINES  Aged Out   COLON CANCER SCREENING ANNUAL FOBT  Discontinued   COVID-19  Vaccine  Discontinued    Health Maintenance  Health Maintenance Due  Topic Date Due   Zoster Vaccines- Shingrix (1 of 2) Never done    Colorectal cancer screening: Type of screening: FOBT/FIT. Completed 04/29/2018. Repeat every 5 years  Mammogram status: No longer required due to age.  Bone Density status: Completed 03/18/2014. Results reflect: Bone density results: OSTEOPENIA. Repeat every 2 years.  Lung Cancer Screening: (Low Dose CT Chest recommended if Age 6-80 years, 20 pack-year currently smoking OR have quit w/in 15years.) does not qualify.   Lung Cancer Screening Referral: N/A  Additional Screening:  Hepatitis C Screening: does qualify; Completed 07/12/2015  Vision Screening: Recommended annual ophthalmology exams for early detection of glaucoma and other disorders of the eye. Is the patient up to date with their annual eye exam?  Yes  Who is the provider or what is the name of the office in which the patient attends annual eye exams? [facility] If pt is not established with a provider, would they like to be referred to a provider to establish care? No .   Dental Screening: Recommended annual dental exams for proper oral hygiene   Community Resource Referral / Chronic Care Management: CRR required this visit?  No   CCM required this visit?  No     Plan:     I have personally reviewed and noted the following in the patient's chart:   Medical and social history Use of alcohol, tobacco or illicit drugs  Current medications and supplements including opioid prescriptions. Patient is not currently taking opioid prescriptions. Functional ability and status Nutritional status Physical activity Advanced directives List of other physicians Hospitalizations, surgeries, and ER visits in previous 12 months Vitals Screenings to include cognitive, depression, and falls Referrals and appointments  In addition, I have reviewed and discussed with patient certain preventive  protocols, quality metrics, and best practice recommendations. A written personalized care plan for preventive services as well as general preventive health recommendations were provided to patient.     Xaviar Lunn L Gearldean Lomanto, CMA   09/10/2022   After Visit Summary: (MyChart) Due to this being a telephonic visit, the after visit summary with patients personalized plan was offered to patient via MyChart   Nurse Notes: Will call office for next year's AWV.  Has had to have  a endoscopy instead of colonoscopy due to blood thinner medication.  Last Endoscopy on 04/29/2018, and every 5 years.

## 2022-09-17 ENCOUNTER — Telehealth: Payer: Self-pay | Admitting: Internal Medicine

## 2022-09-17 NOTE — Telephone Encounter (Signed)
Follow up on DME order placed for Home oxygen 2L.  Spoke with Ollen Barges with [facility] on 09/15/2022 and [facility] today to reach out to the patient in reference to Orders that were placed.  Re Faxed order again today to   [address] [place] [address] [place] Phone :[phone]  Fax  [phone]

## 2022-09-17 NOTE — Telephone Encounter (Signed)
Spoke with the patient's Ins Company and they are no longer in network with [employer].  Pt also has used [facility] in the past and her [facility] Plan no longer accepts them for DME supplies either.  Oxygen Order form has now been completed, signed and faxed to [facility] @ [phone].  Called the patient to notify her that [facility] would be reaching out to her today.

## 2022-09-22 ENCOUNTER — Telehealth: Payer: Self-pay | Admitting: *Deleted

## 2022-09-22 ENCOUNTER — Encounter: Payer: Self-pay | Admitting: Internal Medicine

## 2022-09-22 ENCOUNTER — Other Ambulatory Visit: Payer: Self-pay

## 2022-09-22 ENCOUNTER — Ambulatory Visit (INDEPENDENT_AMBULATORY_CARE_PROVIDER_SITE_OTHER): Payer: Medicare HMO | Admitting: Internal Medicine

## 2022-09-22 VITALS — BP 119/74 | HR 78 | Temp 98.7°F | Ht 68.0 in | Wt 377.2 lb

## 2022-09-22 DIAGNOSIS — R0609 Other forms of dyspnea: Secondary | ICD-10-CM

## 2022-09-22 DIAGNOSIS — F322 Major depressive disorder, single episode, severe without psychotic features: Secondary | ICD-10-CM

## 2022-09-22 LAB — BRAIN NATRIURETIC PEPTIDE: B Natriuretic Peptide: 123.8 pg/mL — ABNORMAL HIGH (ref 0.0–100.0)

## 2022-09-22 MED ORDER — SPIRIVA RESPIMAT 2.5 MCG/ACT IN AERS
2.0000 | INHALATION_SPRAY | Freq: Every day | RESPIRATORY_TRACT | 3 refills | Status: DC
Start: 2022-09-22 — End: 2023-02-03

## 2022-09-22 NOTE — Assessment & Plan Note (Addendum)
[name] is following up for her SOB which has been going on for 2-3 months. She was in clinic 4 weeks ago and was started on 2L oxygen at home. She was only able to get her oxygen last Thursday and started using it then, however, she still has dyspnea on exertion. She mentions even going from her bed to the kitchen causes her SOB. She denies chest pain, palpitations, perspirations, and n/v. She has a home pulse ox as well, she reports the lowest it has been was 70% on exertion. She does occasionally have a dry non productive cough and has some wheezing. She usually spends her time lying down in bed with her laptop. She has nurse help Mon- Thursday for 2 hours to help with hygenic needs. She reports someone has mentioned her about her COPD a long time ago and she has a hx of PE. She uses CPAP every night for her OSA. She used to smoke age [age] y.o, 1ppd. During her clinic visit, her so2 was >93% including ambulatory pulse ox but her home pulse ox reading was 86%. She has gained 5 lbs since 4 wks ago. On physical exam lungs were CTAB and there was no wheezing. There was some bilateral swelling. DDX for her includes obesity hypoventilation syndrome vs COPD exacerbation vs PE vs heart failure. Given her sx and physical findings obesity hypoventilation syndrome could be the most likely cause. She also spends majority of time lying down so her dyspnea could be due to deconditioning.  It could also be COPD. PE is less likely due to bilateral swelling instead of unilateral. HF could also be a cause but less likely. Will check BNP to rule out. Since her dyspnea is still ongoing, will start on LAMA for symptom control. She was given a hand out on how to use her LAMA and recommended watching a video.   - Start Tiotropium Bromide Monohydrate  - Check BNP  - f/u in 2 weeks   Addendum: BNP elevated >100. It is interesting that she is not having PND or orthopnea. She has had 5 lbs weight gain in last 2 weeks. Body  habitus makes volume assessment difficult. I reviewed diagnosis of HFpEF with patient by phone. She would like to try furosemide to see if this helps with dyspnea. I reviewed with her that urinary incontinence symptoms would likely worsen on this medication but she would like to try it. F/u in 2weeks.

## 2022-09-22 NOTE — Telephone Encounter (Signed)
Patient called in stating she has had home O2 x 4 days and has not seen improvement with [facility] with exertion. Denies SHOB at rest. Appt given today at 2:45.

## 2022-09-22 NOTE — Assessment & Plan Note (Deleted)
[name] states she started on Wellbutrin 200mg  once a day.

## 2022-09-22 NOTE — Patient Instructions (Addendum)
Thank you, [name] for allowing [place] to provide your care today. Today we discussed the following with you.  SOB : We are sending you a medicine called Spiriva. Try to see if that helps. You need to pick that up from your pharmacy.  Follow up in 2 weeks.  Labs: We will check your labs today for BNP to rule out any heart causes.    I have ordered the following labs for you:  Lab Orders         Brain natriuretic peptide       I have ordered the following medication/changed the following medications:   Start the following medications: Meds ordered this encounter  Medications   Tiotropium Bromide Monohydrate (SPIRIVA RESPIMAT) 2.5 MCG/ACT AERS    Sig: Inhale 2 puffs into the lungs daily.    Dispense:  4 g    Refill:  3     Follow up:  2 weeks    Remember:   We look forward to seeing you next time. Please call our clinic at [phone] if you have any questions or concerns. The best time to call is Monday-Friday from 9am-4pm, but there is someone available 24/7. If after hours or the weekend, call the main hospital number and ask for the Internal Medicine Resident On-Call. If you need medication refills, please notify your pharmacy one week in advance and they will send [place] a request.   Thank you for trusting me with your care. Wishing you the best!   Patricia Christians, DO [facility]

## 2022-09-22 NOTE — Progress Notes (Signed)
The care of the patient was discussed with Dr. Rudene Christians and the assessment and plan was formulated with their assistance.  Please see their note for official documentation of the patient encounter.   Subjective:   Patient ID: [name] female   DOB: [birth year] 77 y.o.   MRN: [id number]  HPI: [name] is a 77 y.o. female who is a former smoker with PMH of PE (2002 and 2015) on anticoagulation, obesity hypoventilation syndrome, morbid obesity, and  OSA on CPAP presenting to clinic for f/u for her dyspnea. Her other PMH is as below. She was recently seen for her SOB on 6/27 which has not improved. She is worried she might have PE. Please see problem based assessment and plan for the details of this visit.   Past Medical History:  Diagnosis Date   Arthritis    osteoarthritis , kness.   Chronic hypoxemic respiratory failure (HCC) 11/18/2013   8/26 CTA >>> extensive b/l PE's with evidence of right heart strain c/w at least submassive PE.  8/26 Echo >>> EF 55%, grade 1 diastolic dysfx, mod/severe RV dilation with mod RV systolic dysfx, PAS 51 mmHg  8/27 LE Dopplers >>>negative 01/2014 Echo> EF 60%, RV "upper limits of normal", cannot estimate RVSP    Chronic kidney disease    Renal insuffucinecy, cr-1.33 in 10/2005.   Depression    followed by Dr. Evelene Croon   Diverticulosis of colon    Embolism and thrombosis of unspecified artery (HCC) 01/11/2020   Fibrocystic breast disease    History of hepatitis B    History of pulmonary embolism 01/2002   Hyperlipidemia    Hypertension    Morbid obesity with body mass index (BMI) of 50.0 to 59.9 in adult [facility]) 01/19/2006        Neck nodule    not erythematous, movable like cyst located at the base of posterior neck on the left side(not painful), will need follow up for    Obesity    Obstructive sleep apnea    cpap    Pulmonary nodule    Stable on repeat imaging. LLL nodule 5 mm   Vasovagal near-syncope 06/28/2020    Acute episode around 10:30 am when preparing to leave today's visit.  Had been sitting in chair for 2 hours; minimal sleep night prior; last meal was popcorn and sips of water around 1 am.  "I feel dizzy" she noted while sitting - recheck BP not initially measurable, pulses faint, became cool and  diaphoretic and minimally verbally responsive though did not completely lose consciousness.  Denied d   Current Outpatient Medications  Medication Sig Dispense Refill   buPROPion (WELLBUTRIN XL) 300 MG 24 hr tablet Take 300 mg by mouth daily.     Tiotropium Bromide Monohydrate (SPIRIVA RESPIMAT) 2.5 MCG/ACT AERS Inhale 2 puffs into the lungs daily. 4 g 3   ALPRAZolam (XANAX) 1 MG tablet Take 1 tablet by mouth at bedtime.     desvenlafaxine (PRISTIQ) 100 MG 24 hr tablet Take 100 mg by mouth every morning.     diclofenac Sodium (VOLTAREN) 1 % GEL APPLY 4 G TOPICALLY 4 TIMES DAILY 100 g 1   Multiple Vitamin (MULTIVITAMIN) tablet Take 1 tablet by mouth daily. (Patient taking differently: Take 1 tablet by mouth every evening.) 30 tablet 3   Omega-3 Fatty Acids (OMEGA-3 FISH OIL PO) Take 1 capsule by mouth every evening.      pravastatin (PRAVACHOL) 40 MG tablet Take 1 tablet (40 mg  total) by mouth daily. 90 tablet 3   rivaroxaban (XARELTO) 20 MG TABS tablet TAKE 1 TABLET EVERY DAY WITH SUPPER 90 tablet 3   solifenacin (VESICARE) 5 MG tablet Take 5 mg by mouth daily.     No current facility-administered medications for this visit.   Family History  Problem Relation Age of Onset   Hypertension Other        family history   Kidney disease Maternal Uncle    Parkinson's disease Brother    Hypertension Mother    Social History   Socioeconomic History   Marital status: Widowed    Spouse name: Not on file   Number of children: Not on file   Years of education: Not on file   Highest education level: Not on file  Occupational History   Occupation: Retired   Occupation: Disabled  Tobacco Use   Smoking  status: Former    Current packs/day: 0.00    Average packs/day: 1 pack/day for 20.0 years (20.0 ttl pk-yrs)    Types: Cigarettes    Start date: 03/08/1964    Quit date: 03/08/1984    Years since quitting: 38.5   Smokeless tobacco: Never  Vaping Use   Vaping status: Never Used  Substance and Sexual Activity   Alcohol use: No    Alcohol/week: 0.0 standard drinks of alcohol   Drug use: No   Sexual activity: Not Currently  Other Topics Concern   Not on file  Social History Narrative   Grew up in government housing. Received college education, had career Education officer, community, Veterinary surgeon, others), and own home before depression dx. Now back in government housing.    Started smoking in her teens and quit in her 82s.  She never smoked more than 1 pack/day.  Likely, less than a 20-pack-year history      Current Social History 07/19/2020      Patient lives alone in a ground floor apartment which is 1 story. There are not steps up to the entrance the patient uses.       Patient's method of transportation is personal car.      The highest level of education was Bachelor's Degree.      The patient currently retired      Identified important Relationships are "I talk with my sister in [place] every night."       Pets : "I had to put down my 64.5 yo dog in July 2020." Made a goal to look into fostering a pet.       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Ducatte, BSN, RN-BC        Social Determinants of Health   Financial Resource Strain: Low Risk  (09/10/2022)   Overall Financial Resource Strain (CARDIA)    Difficulty of Paying Living Expenses: Not very hard  Food Insecurity: No Food Insecurity (09/10/2022)   Hunger Vital Sign    Worried About Running Out of Food in the Last Year: Never true    Ran Out of Food in the Last Year: Never true  Transportation Needs:  No Transportation Needs (09/10/2022)   PRAPARE - Administrator, Civil Service (Medical): No    Lack of Transportation (Non-Medical): No  Physical Activity: Inactive (09/10/2022)   Exercise Vital Sign  Days of Exercise per Week: 0 days    Minutes of Exercise per Session: 0 min  Stress: No Stress Concern Present (09/10/2022)   [employer] of Occupational Health - Occupational Stress Questionnaire    Feeling of Stress : Not at all  Social Connections: Socially Isolated (09/10/2022)   Social Connection and Isolation Panel [NHANES]    Frequency of Communication with Friends and Family: More than three times a week    Frequency of Social Gatherings with Friends and Family: Never    Attends Religious Services: Never    Database administrator or Organizations: No    Attends Banker Meetings: Never    Marital Status: Widowed   Review of Systems: ROS negative except for what is noted on the assessment and plan.  Objective:  Physical Exam: Vitals:   09/22/22 1449  BP: 119/74  Pulse: 78  Temp: 98.7 F (37.1 C)  TempSrc: Oral  SpO2: 93%  Weight: (!) 377 lb 3.2 oz (171.1 kg)  Height: 5\' 8"  (1.727 m)    General appearance: well appearing; Eyes: anicteric sclerae, moist conjunctivae; no lid-lag;  HENT: NCAT; oropharynx, MMM, no mucosal ulcerations; normal hard and soft palate Neck: Trachea midline; FROM, supple, no lymphadenopathy, no JVD Lungs: CTAB, no crackles, no wheeze, with normal respiratory effort and no intercostal retractions CV: RRR, S1, S2, no MRGs  Abdomen: Soft, non-tender; non-distended, BS present  Extremities: No peripheral edema, radial and DP pulses present bilaterally  Skin: Normal temperature, turgor and texture; no rash Psych: Appropriate affect Neuro: Alert and oriented to person and place, no focal deficit   Assessment & Plan:  Exertional dyspnea with hypoxia Ms. Spare is following up for her SOB which has been going on for 2-3  months. She was in clinic 4 weeks ago and was started on 2L oxygen at home. She was only able to get her oxygen last Thursday and started using it then, however, she still has dyspnea on exertion. She mentions even going from her bed to the kitchen causes her SOB. She denies chest pain, palpitations, perspirations, and n/v. She has a home pulse ox as well, she reports the lowest it has been was 70% on exertion. She does occasionally have a dry non productive cough and has some wheezing. She usually spends her time lying down in bed with her laptop. She has nurse help Mon- Thursday for 2 hours to help with hygenic needs. She reports someone has mentioned her about her COPD a long time ago and she has a hx of PE. She uses CPAP every night for her OSA. She used to smoke age [age] y.o, 1ppd. During her clinic visit, her so2 was >93% including ambulatory pulse ox but her home pulse ox reading was 86%. She has gained 5 lbs since 4 wks ago. On physical exam lungs were CTAB and there was no wheezing. There was some bilateral swelling. DDX for her includes obesity hypoventilation syndrome vs COPD exacerbation vs PE vs heart failure. Given her sx and physical findings obesity hypoventilation syndrome could be the most likely cause. She also spends majority of time lying down so her dyspnea could be due to deconditioning.  It could also be COPD. PE is less likely due to bilateral swelling instead of unilateral. HF could also be a cause but less likely. Will check BNP to rule out. Since her dyspnea is still ongoing, will start on LAMA for symptom control. She was given a hand out on how to  use her LAMA and recommended watching a video.   - Start Tiotropium Bromide Monohydrate  - Check BNP  - f/u in 2 weeks  Patient discussed with Dr. Lafonda Mosses

## 2022-09-23 MED ORDER — FUROSEMIDE 40 MG PO TABS
40.0000 mg | ORAL_TABLET | Freq: Every day | ORAL | 11 refills | Status: DC
Start: 2022-09-23 — End: 2023-08-26

## 2022-09-23 NOTE — Addendum Note (Signed)
Addended by: Lucille Passy on: 09/23/2022 10:50 AM   Modules accepted: Orders

## 2022-09-23 NOTE — Progress Notes (Signed)
Internal Medicine Clinic Attending  Case discussed with the resident at the time of the visit.  We reviewed the resident's history and exam and pertinent patient test results.  I agree with the assessment, diagnosis, and plan of care documented in the resident's note.    At next visit, please repeat weight, ambulatory O2 sat, and BMP.

## 2022-10-07 ENCOUNTER — Encounter: Payer: Self-pay | Admitting: Student

## 2022-10-07 ENCOUNTER — Other Ambulatory Visit: Payer: Self-pay

## 2022-10-07 ENCOUNTER — Ambulatory Visit (INDEPENDENT_AMBULATORY_CARE_PROVIDER_SITE_OTHER): Payer: Medicare HMO | Admitting: Student

## 2022-10-07 VITALS — BP 131/62 | HR 83 | Temp 98.6°F | Ht 68.0 in | Wt 363.2 lb

## 2022-10-07 DIAGNOSIS — N1831 Chronic kidney disease, stage 3a: Secondary | ICD-10-CM

## 2022-10-07 DIAGNOSIS — R0609 Other forms of dyspnea: Secondary | ICD-10-CM

## 2022-10-07 DIAGNOSIS — Z86718 Personal history of other venous thrombosis and embolism: Secondary | ICD-10-CM

## 2022-10-07 NOTE — Patient Instructions (Addendum)
Thank you for allowing me to be a part of your care team. Today we discussed a few things:  I ordered a V/Q scan to further assess your oxygen levels In the meantime, be sure and use the Spiriva twice/daily I ordered blood work to make sure the furosemide is not negatively affecting your kidney function. I will let you know when the results come back.

## 2022-10-07 NOTE — Progress Notes (Signed)
CC: Shortness of breath  HPI:  [name] is a 77 y.o. female living with a history stated below and presents today for increasing shortness of breath. Please see problem based assessment and plan for additional details.  Past Medical History:  Diagnosis Date   Arthritis    osteoarthritis , kness.   Chronic hypoxemic respiratory failure (HCC) 11/18/2013   8/26 CTA >>> extensive b/l PE's with evidence of right heart strain c/w at least submassive PE.  8/26 Echo >>> EF 55%, grade 1 diastolic dysfx, mod/severe RV dilation with mod RV systolic dysfx, PAS 51 mmHg  8/27 LE Dopplers >>>negative 01/2014 Echo> EF 60%, RV "upper limits of normal", cannot estimate RVSP    Chronic kidney disease    Renal insuffucinecy, cr-1.33 in 10/2005.   Depression    followed by Dr. Evelene Croon   Diverticulosis of colon    Embolism and thrombosis of unspecified artery (HCC) 01/11/2020   Fibrocystic breast disease    History of hepatitis B    History of pulmonary embolism 01/2002   Hyperlipidemia    Hypertension    Morbid obesity with body mass index (BMI) of 50.0 to 59.9 in adult [facility]) 01/19/2006        Neck nodule    not erythematous, movable like cyst located at the base of posterior neck on the left side(not painful), will need follow up for    Obesity    Obstructive sleep apnea    cpap    Pulmonary nodule    Stable on repeat imaging. LLL nodule 5 mm   Vasovagal near-syncope 06/28/2020   Acute episode around 10:30 am when preparing to leave today's visit.  Had been sitting in chair for 2 hours; minimal sleep night prior; last meal was popcorn and sips of water around 1 am.  "I feel dizzy" she noted while sitting - recheck BP not initially measurable, pulses faint, became cool and  diaphoretic and minimally verbally responsive though did not completely lose consciousness.  Denied d    Current Outpatient Medications on File Prior to Visit  Medication Sig Dispense Refill   ALPRAZolam (XANAX)  1 MG tablet Take 1 tablet by mouth at bedtime.     buPROPion (WELLBUTRIN XL) 300 MG 24 hr tablet Take 300 mg by mouth daily.     desvenlafaxine (PRISTIQ) 100 MG 24 hr tablet Take 100 mg by mouth every morning.     diclofenac Sodium (VOLTAREN) 1 % GEL APPLY 4 G TOPICALLY 4 TIMES DAILY 100 g 1   furosemide (LASIX) 40 MG tablet Take 1 tablet (40 mg total) by mouth daily. 30 tablet 11   Multiple Vitamin (MULTIVITAMIN) tablet Take 1 tablet by mouth daily. (Patient taking differently: Take 1 tablet by mouth every evening.) 30 tablet 3   Omega-3 Fatty Acids (OMEGA-3 FISH OIL PO) Take 1 capsule by mouth every evening.      pravastatin (PRAVACHOL) 40 MG tablet Take 1 tablet (40 mg total) by mouth daily. 90 tablet 3   rivaroxaban (XARELTO) 20 MG TABS tablet TAKE 1 TABLET EVERY DAY WITH SUPPER 90 tablet 3   solifenacin (VESICARE) 5 MG tablet Take 5 mg by mouth daily.     Tiotropium Bromide Monohydrate (SPIRIVA RESPIMAT) 2.5 MCG/ACT AERS Inhale 2 puffs into the lungs daily. 4 g 3   [DISCONTINUED] amLODipine (NORVASC) 5 MG tablet Take 1 tablet (5 mg total) by mouth daily. 90 tablet 3   No current facility-administered medications on file prior to visit.  Family History  Problem Relation Age of Onset   Hypertension Other        family history   Kidney disease Maternal Uncle    Parkinson's disease Brother    Hypertension Mother     Social History   Socioeconomic History   Marital status: Widowed    Spouse name: Not on file   Number of children: Not on file   Years of education: Not on file   Highest education level: Not on file  Occupational History   Occupation: Retired   Occupation: Disabled  Tobacco Use   Smoking status: Former    Current packs/day: 0.00    Average packs/day: 1 pack/day for 20.0 years (20.0 ttl pk-yrs)    Types: Cigarettes    Start date: 03/08/1964    Quit date: 03/08/1984    Years since quitting: 38.6   Smokeless tobacco: Never  Vaping Use   Vaping status: Never Used   Substance and Sexual Activity   Alcohol use: No    Alcohol/week: 0.0 standard drinks of alcohol   Drug use: No   Sexual activity: Not Currently  Other Topics Concern   Not on file  Social History Narrative   Grew up in government housing. Received college education, had career Education officer, community, Veterinary surgeon, others), and own home before depression dx. Now back in government housing.    Started smoking in her teens and quit in her 73s.  She never smoked more than 1 pack/day.  Likely, less than a 20-pack-year history      Current Social History 07/19/2020      Patient lives alone in a ground floor apartment which is 1 story. There are not steps up to the entrance the patient uses.       Patient's method of transportation is personal car.      The highest level of education was Bachelor's Degree.      The patient currently retired      Identified important Relationships are "I talk with my sister in [place] every night."       Pets : "I had to put down my 32.5 yo dog in July 2020." Made a goal to look into fostering a pet.       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Ducatte, BSN, RN-BC        Social Determinants of Health   Financial Resource Strain: Low Risk  (09/10/2022)   Overall Financial Resource Strain (CARDIA)    Difficulty of Paying Living Expenses: Not very hard  Food Insecurity: No Food Insecurity (09/10/2022)   Hunger Vital Sign    Worried About Running Out of Food in the Last Year: Never true    Ran Out of Food in the Last Year: Never true  Transportation Needs: No Transportation Needs (09/10/2022)   PRAPARE - Administrator, Civil Service (Medical): No    Lack of Transportation (Non-Medical): No  Physical Activity: Inactive (09/10/2022)   Exercise Vital Sign    Days of Exercise per Week: 0 days    Minutes of Exercise per  Session: 0 min  Stress: No Stress Concern Present (09/10/2022)   [employer] of Occupational Health - Occupational Stress Questionnaire    Feeling of Stress : Not at all  Social Connections: Socially  Isolated (09/10/2022)   Social Connection and Isolation Panel [NHANES]    Frequency of Communication with Friends and Family: More than three times a week    Frequency of Social Gatherings with Friends and Family: Never    Attends Religious Services: Never    Database administrator or Organizations: No    Attends Banker Meetings: Never    Marital Status: Widowed  Intimate Partner Violence: Not At Risk (09/10/2022)   Humiliation, Afraid, Rape, and Kick questionnaire    Fear of Current or Ex-Partner: No    Emotionally Abused: No    Physically Abused: No    Sexually Abused: No    Review of Systems: ROS negative except for what is noted on the assessment and plan.  Vitals:   10/07/22 1053  BP: 131/62  Pulse: 83  Temp: 98.6 F (37 C)  TempSrc: Oral  SpO2: 95%  Weight: (!) 363 lb 3.2 oz (164.7 kg)  Height: 5\' 8"  (1.727 m)    Physical Exam: Constitutional: obese; sitting in wheelchair, in no acute distress Cardiovascular: regular rate and rhythm, no m/r/g Pulmonary/Chest: normal work of breathing on room air, lungs clear to auscultation bilaterally Psych: normal mood and behavior  Assessment & Plan:     Patient seen with Dr. Criselda Peaches  Exertional dyspnea with hypoxia Patient with increasing SOB for the past few months. Seen in clinic 09/22/22, and at that time, Spiriva BID was initiated as well as Lasix 40 mg/daily after BNP returned elevated (Echocardiogram in 2020 with pEF, but subsequent echo's limited due to body habitus.) Since last clinic visit, patient is down 14 lbs and notes improvement in LEE, but unfortunately, the SOB has not improved. Notably, patient has not used Spiriva BID (often once daily or "when I cough"). O2 saturation is 95% in office, but  patient states at-home pulse ox reads in the 70's when ambulating. Patient with history of PE and has been on Xarelto for a few years. Patient also has Antiphospholipid Ab Syndrome based on previous titers. Patient expresses continued concern of clots causing symptoms. -Advised patient to take Spiriva as prescribed -VQ scan ordered  - Pulmonology referral   Carmina Miller, D.O. [facility] Internal Medicine, PGY-1 Phone: [phone] Date 10/07/2022 Time 4:04 PM

## 2022-10-07 NOTE — Assessment & Plan Note (Addendum)
Patient with increasing SOB for the past few months. Seen in clinic 09/22/22, and at that time, Spiriva BID was initiated as well as Lasix 40 mg/daily after BNP returned elevated (Echocardiogram in 2020 with pEF, but subsequent echo's limited due to body habitus.) Since last clinic visit, patient is down 14 lbs and notes improvement in LEE, but unfortunately, the SOB has not improved. Notably, patient has not used Spiriva BID (often once daily or "when I cough"). O2 saturation is 95% in office, but patient states at-home pulse ox reads in the 70's when ambulating. Patient with history of PE and has been on Xarelto for a few years. Patient also has Antiphospholipid Ab Syndrome based on previous titers. Patient expresses continued concern of clots causing symptoms. -Advised patient to take Spiriva as prescribed -VQ scan ordered  - Pulmonology referral

## 2022-10-09 NOTE — Progress Notes (Signed)
BMP unremarkable. Spoke with patient on phone about results.

## 2022-10-12 NOTE — Progress Notes (Signed)
Internal Medicine Clinic Attending  I was physically present during the key portions of the resident provided service and participated in the medical decision making of patient's management care. I reviewed pertinent patient test results.  The assessment, diagnosis, and plan were formulated together and I agree with the documentation in the resident's note.  [name] has an interesting issue of continued hypoxia and dyspnea despite treatment with DOAC.  There are many things that could be causing this, unfortunately, windows for TTE were not good and it was difficult to assess PAP (from my reading of the echo report) to see if she would have possible pulmonary hypertension.  I think the next step would be VQ scan to assess for chronic VTE in the lungs given her h/o hypercoagulability and then referral to pulmonary for further testing to explain her symptoms.  We discussed this in the room and patient was happy with this plan, hoping to get answers for her current state.   Patricia Catalina, MD

## 2022-11-25 ENCOUNTER — Telehealth: Payer: Self-pay | Admitting: Internal Medicine

## 2022-11-25 NOTE — Telephone Encounter (Signed)
Rec'd message from [facility] In reference to scheduling this patient for the following order:    NM Pulmonary Perf and Vent (Order [id number]) Imaging Date: 10/07/2022 Department: [facility] Ordering: Carmina Miller, DO Authorizing: [name], MD      "NM pulmonary perf.We schedule them and with a chest xray prior........Marland KitchenMarland KitchenAnd per Hansel Starling, we don't do vent any more "  Unable to sch this procedure until the Prior Chest X-ray has been completed.  If any questions please contact Radiology @ [phone].   Please advise

## 2022-11-26 ENCOUNTER — Other Ambulatory Visit: Payer: Self-pay | Admitting: Internal Medicine

## 2022-11-26 DIAGNOSIS — Z86718 Personal history of other venous thrombosis and embolism: Secondary | ICD-10-CM

## 2022-11-26 NOTE — Progress Notes (Signed)
Spoke with radiology regarding previously ordered VQ scan. We have been unable to schedule. Vent scans not done anymore since COVID, alternative order placed today. This can be done at [facility] or [facility]. Patient will need 2-view CXR within 24H of VQ scan, order placed. Referral coordinator will work on authorization and scheduling.

## 2022-12-01 ENCOUNTER — Other Ambulatory Visit: Payer: Self-pay | Admitting: Internal Medicine

## 2022-12-01 DIAGNOSIS — R0609 Other forms of dyspnea: Secondary | ICD-10-CM

## 2022-12-01 DIAGNOSIS — Z86718 Personal history of other venous thrombosis and embolism: Secondary | ICD-10-CM

## 2022-12-01 NOTE — Progress Notes (Signed)
Received message regarding order for VQ scan. Reviewed Dr. Graciella Freer note from 11/26/22. I have placed new orders for a 2 view CXR and NM pulmonary study with perf and vent.

## 2022-12-05 ENCOUNTER — Encounter (HOSPITAL_COMMUNITY)
Admission: RE | Admit: 2022-12-05 | Discharge: 2022-12-05 | Disposition: A | Discharge status: Home / Self Care | Payer: Medicare HMO | Source: Ambulatory Visit | Attending: Internal Medicine | Admitting: Internal Medicine

## 2022-12-05 DIAGNOSIS — R0609 Other forms of dyspnea: Secondary | ICD-10-CM

## 2022-12-05 DIAGNOSIS — Z86718 Personal history of other venous thrombosis and embolism: Secondary | ICD-10-CM | POA: Diagnosis present

## 2022-12-05 MED ORDER — TECHNETIUM TO 99M ALBUMIN AGGREGATED
4.0000 | Freq: Once | INTRAVENOUS | Status: AC | PRN
  Administered 2022-12-05: 4 via INTRAVENOUS

## 2022-12-10 ENCOUNTER — Telehealth: Payer: Self-pay

## 2022-12-10 NOTE — Telephone Encounter (Addendum)
Requesting test results from 12/05/2022, please call pt back.

## 2022-12-18 ENCOUNTER — Other Ambulatory Visit: Payer: Self-pay | Admitting: Internal Medicine

## 2022-12-18 ENCOUNTER — Telehealth: Payer: Self-pay | Admitting: Internal Medicine

## 2022-12-18 DIAGNOSIS — D6861 Antiphospholipid syndrome: Secondary | ICD-10-CM

## 2022-12-18 DIAGNOSIS — J9621 Acute and chronic respiratory failure with hypoxia: Secondary | ICD-10-CM

## 2022-12-18 DIAGNOSIS — R0609 Other forms of dyspnea: Secondary | ICD-10-CM

## 2022-12-18 DIAGNOSIS — Z79899 Other long term (current) drug therapy: Secondary | ICD-10-CM

## 2022-12-18 DIAGNOSIS — Z86718 Personal history of other venous thrombosis and embolism: Secondary | ICD-10-CM

## 2022-12-18 DIAGNOSIS — Z862 Personal history of diseases of the blood and blood-forming organs and certain disorders involving the immune mechanism: Secondary | ICD-10-CM | POA: Insufficient documentation

## 2022-12-18 HISTORY — DX: Antiphospholipid syndrome: D68.61

## 2022-12-18 NOTE — Telephone Encounter (Signed)
Called to review with [name] her perfusion scan results, which were nml.  She continues to have dyspnea and hypoxia, worse with exertion (few steps).  Wearing 02 2L most of the time, and continues her nightly CPAP.  Taking all medications.  Experiences a nocturnal cough which is sometimes productive of pale yellowish sputum.  Her Spiriva helps.  Spending 23 hrs in bed (not unusual for her). She notes that the best part of the day is when she first gets up in the morning - breaths comfortably for 30-60 minutes before dyspnea sets in.  No improvement with lasix empirically started in past couple of months, despite association with significant weight loss (presumed water weight; she has not had much luck with dietary weight loss strategies and unfortunately doesn't qualify for GLP1a).  Hx of hypercoagulability from antiphospholipid antibody syndrome, resulting in 2 PE episodes; adherent without fail to her xarelto.  No evidence of pulmonary edema.  Has not been on macrodantin or amiodarone that I can tell, which might predispose to ILD.  Echo has been considered but has historically been inadequate study due to habitus.  Radiographic emphysematous changes are noted on imaging.  Will proceed with CT scan to look for interstitial changes and any suggestion of pulmonary HTN.  Had PFTs 4 years ago and these should be repeated as well.  She has pulmonary appt 01/08/23.  Needs bmp to f/u renal fxn/'lytes given initiation of lasix 40 mg.  She reports episodes of feeling woozy and off balance.  Overly diuresed? I will mssg front desk to get her an inperson appt.

## 2022-12-25 ENCOUNTER — Ambulatory Visit (HOSPITAL_COMMUNITY)
Admission: RE | Admit: 2022-12-25 | Discharge: 2022-12-25 | Disposition: A | Discharge status: Home / Self Care | Payer: Medicare HMO | Source: Ambulatory Visit | Attending: Internal Medicine | Admitting: Internal Medicine

## 2022-12-25 DIAGNOSIS — J9621 Acute and chronic respiratory failure with hypoxia: Secondary | ICD-10-CM | POA: Insufficient documentation

## 2022-12-30 ENCOUNTER — Ambulatory Visit: Payer: Medicare HMO | Admitting: Internal Medicine

## 2022-12-30 VITALS — BP 119/57 | HR 82 | Temp 98.1°F | Ht 68.0 in | Wt 345.0 lb

## 2022-12-30 DIAGNOSIS — F322 Major depressive disorder, single episode, severe without psychotic features: Secondary | ICD-10-CM | POA: Diagnosis not present

## 2022-12-30 DIAGNOSIS — R7303 Prediabetes: Secondary | ICD-10-CM

## 2022-12-30 DIAGNOSIS — R609 Edema, unspecified: Secondary | ICD-10-CM | POA: Diagnosis not present

## 2022-12-30 DIAGNOSIS — E66813 Obesity, class 3: Secondary | ICD-10-CM | POA: Diagnosis not present

## 2022-12-30 DIAGNOSIS — R0609 Other forms of dyspnea: Secondary | ICD-10-CM

## 2022-12-30 DIAGNOSIS — Z23 Encounter for immunization: Secondary | ICD-10-CM

## 2022-12-30 DIAGNOSIS — Z6841 Body Mass Index (BMI) 40.0 and over, adult: Secondary | ICD-10-CM

## 2022-12-30 DIAGNOSIS — Z8679 Personal history of other diseases of the circulatory system: Secondary | ICD-10-CM

## 2022-12-30 NOTE — Assessment & Plan Note (Signed)
Pulmonary consultation will finally occur next week.  Multiple etiologies have been considered for her dyspnea and hypoxia.  No improvement in symptoms (and has never had orthopnea) following empiric furosemide 40 mg daily.  No evidence of heart failure.  No improvement with a 30 pound weight loss since 09/2022.  On long-term Xarelto (no interruption of adherence) for antiphospholipid antibody syndrome and history of PEs: Perfusion scan identified no evidence of perfusion defect.  CT scan does show concern for pulmonary hypertension.  There are interstitial changes, insufficient for a diagnosis of UIP.  Bronchiectasis and centrilobular emphysema are noted to be mild.  Long discussion today about systemic HTN versus pulmonary HTN.  She is very concerned that when she exerts herself her oxygen saturation drops even while wearing the oxygen-I explained to her that her lungs are not able to meet the demand of moving in this case, and that we encouraged her to wear her oxygen even when she is up and about.  She does not have a small portable tank.  We discussed possible next steps (echocardiogram if acoustic windows have improved with weight loss?; right heart catheterization?) but that opinion of her pulmonologist is most important at this time.

## 2022-12-30 NOTE — Patient Instructions (Signed)
Ms. Naegele,  I'm glad we had an opportunity to go through your test results in detail and to discuss your health concerns.  I'll be sure to send a summary of today's visit to your pulmonology consultant.  If you can, please get your eyeglasses!  If your vision is corrected, we will be good.  If not, you'll need to see Dr. Dione Booze again.  Let's get together again just to talk about your memory and balance.  That's also very important!    Take care and stay well.  Dr. Mayford Knife

## 2022-12-30 NOTE — Progress Notes (Signed)
[name] is here to discuss her recent chest CT results and her ongoing dyspnea and hypoxia.  She also shares concern about decreased vision, particularly [name] noticeable when driving (has a difficult time seeing road signs).  Her vision has worsened significantly since she had her cataracts removed in the last couple of years.  She last saw her ophthalmologist Dr. Alden [name] in 06/2022 and has not yet had eyeglasses made with her prescription.  She cannot recall if she was having vision difficulty during that appointment.  As an aside, she feels like her memory and thinking process have declined.  Gradual process.  She has had a couple of episodes of losing her balance when falling, though infrequent.  She has not fallen.  Does not feel dizzy during those episodes.  Continues to lose weight, which she attributes to coming off of her former antidepressant (changed to does venlafaxine).  See problem-based discussion below for additional notes.  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 arthritis 06/05/2021   Multinodular goiter 06/21/2019   Centrilobular emphysema (HCC) 03/12/2019   Abdominal aortic atherosclerosis (HCC) 02/13/2019   No natural teeth 12/18/2016   Exertional dyspnea with hypoxia    Mixed incontinence 04/05/2015   Osteopenia 03/08/2014   Prediabetes 04/04/2013   Preventative health care 10/21/2010   Hyperlipidemia 03/07/2006   Class 3 severe obesity due to excess calories with serious comorbidity and body mass index (BMI) of 50.0 to 59.9 in adult [facility]) 01/19/2006   Severe major depression (HCC) 01/19/2006   OSA (obstructive sleep apnea) 01/19/2006   History of hypertension 01/19/2006   OA  (osteoarthritis) of knee 01/19/2006   History of hepatitis B 01/19/2006   Personal history of submassive PE 2015 requiring tpa; on lifelong anticoagulation 01/19/2006   Current Outpatient Medications  Medication Instructions   buPROPion (WELLBUTRIN XL) 300 mg, Oral, Daily   desvenlafaxine (PRISTIQ) 100 mg, Oral, Every morning   diclofenac Sodium (VOLTAREN) 1 % GEL APPLY 4 G TOPICALLY 4 TIMES DAILY   furosemide (LASIX) 40 mg, Oral, Daily   Multiple Vitamin (MULTIVITAMIN) tablet 1 tablet, Oral, Daily   Omega-3 Fatty Acids (OMEGA-3 FISH OIL PO) 1 capsule, Oral, Every evening   pravastatin (PRAVACHOL) 40 mg, Oral, Daily   rivaroxaban (XARELTO) 20 MG TABS tablet TAKE 1 TABLET EVERY DAY WITH SUPPER   solifenacin (VESICARE) 5 mg, Oral, Daily   Tiotropium Bromide Monohydrate (SPIRIVA RESPIMAT) 2.5 MCG/ACT AERS 2 puffs, Inhalation, Daily   Objective BP (!) 119/57 (BP Location: Left Arm, Patient Position: Sitting, Cuff Size: Normal)   Pulse 82   Temp 98.1 F (36.7 C) (Oral)   Ht 5\' 8"  (1.727 m)   Wt (!) 345 lb (156.5 kg)   SpO2 98% Comment: on 2 liters of oxygen  BMI 52.46 kg/m  [name] appears well, has visibly lost weight.  Is wearing her oxygen nasal cannula at 2 L.  Saturation without supplemental O2 after walking in hall is 90% at which time she is quite dyspneic.  Neck veins are flat.  Heart RRR no significant murmur.  Radial pulses 2+.  Nonlabored respirations.  Lungs are clear except for very fine rales at lung bases, perhaps more so at the right.  LEs without pitting edema though  ankles are a bit puffy.  Skin turgor is decreased generally.  Problems discussed:  Exertional dyspnea with hypoxia Pulmonary consultation will finally occur next week.  Multiple etiologies have been considered for her dyspnea and hypoxia.  No improvement in symptoms (and has never had orthopnea) following empiric furosemide 40 mg daily.  No evidence of heart failure.  No improvement with a 30 pound  weight loss since 09/2022.  On long-term Xarelto (no interruption of adherence) for antiphospholipid antibody syndrome and history of PEs: Perfusion scan identified no evidence of perfusion defect.  CT scan does show concern for pulmonary hypertension.  There are interstitial changes, insufficient for a diagnosis of UIP.  Bronchiectasis and centrilobular emphysema are noted to be mild.  Long discussion today about systemic HTN versus pulmonary HTN.  She is very concerned that when she exerts herself her oxygen saturation drops even while wearing the oxygen-I explained to her that her lungs are not able to meet the demand of moving in this case, and that we encouraged her to wear her oxygen even when she is up and about.  She does not have a small portable tank.  We discussed possible next steps (echocardiogram if acoustic windows have improved with weight loss?; right heart catheterization?) but that opinion of her pulmonologist is most important at this time.  History of hypertension 119/57 without medication.  Monitor.  Class 3 severe obesity due to excess calories with serious comorbidity and body mass index (BMI) of 50.0 to 59.9 in adult [facility]) Weight now 345 (52.46 kg), decreased about 30 pounds since 09/2022.  Some of this likely water weight upon initiation of Lasix 40 mg daily (check BMP today for electrolytes and renal function).  Her appetite has decreased and she no longer has the cravings that she once did after changing her antihypertensive.  She does not qualify for a [employer]-covered GLP-1 a.  Severe major depression (HCC) Continues on desvenlafaxine.  PHQ 9 score 11, much of which is attributed to concerns about her weight and health.  Her psychiatrist is working with her to decrease Xanax, previously 1 mg now 0.5 mg nightly with plan to continue tapering off over a long period of time.  Trazodone 50 mg 1-3 tabs nightly has been prescribed by her psychiatrist to replace the benzodiazepine.   She is tolerating it well.  Refills are not done through [facility] for these 3 medications.  Plan to return in about a month for recheck of weight and symptoms and to address concerns about her cognition and balance.

## 2022-12-30 NOTE — Assessment & Plan Note (Signed)
Weight now 345 (52.46 kg), decreased about 30 pounds since 09/2022.  Some of this likely water weight upon initiation of Lasix 40 mg daily (check BMP today for electrolytes and renal function).  Her appetite has decreased and she no longer has the cravings that she once did after changing her antihypertensive.  She does not qualify for a [employer]-covered GLP-1 a.

## 2022-12-30 NOTE — Assessment & Plan Note (Signed)
119/57 without medication.  Monitor.

## 2022-12-30 NOTE — Assessment & Plan Note (Signed)
Continues on desvenlafaxine.  PHQ 9 score 11, much of which is attributed to concerns about her weight and health.  Her psychiatrist is working with her to decrease Xanax, previously 1 mg now 0.5 mg nightly with plan to continue tapering off over a long period of time.  Trazodone 50 mg 1-3 tabs nightly has been prescribed by her psychiatrist to replace the benzodiazepine.  She is tolerating it well.  Refills are not done through [facility] for these 3 medications.

## 2022-12-31 LAB — BMP8+ANION GAP
Anion Gap: 16 mmol/L (ref 10.0–18.0)
BUN/Creatinine Ratio: 7 — ABNORMAL LOW (ref 12–28)
BUN: 7 mg/dL — ABNORMAL LOW (ref 8–27)
CO2: 24 mmol/L (ref 20–29)
Calcium: 9 mg/dL (ref 8.7–10.3)
Chloride: 101 mmol/L (ref 96–106)
Creatinine, Ser: 0.99 mg/dL (ref 0.57–1.00)
Glucose: 99 mg/dL (ref 70–99)
Potassium: 3.7 mmol/L (ref 3.5–5.2)
Sodium: 141 mmol/L (ref 134–144)
eGFR: 59 mL/min/{1.73_m2} — ABNORMAL LOW (ref >59)

## 2023-01-05 NOTE — Telephone Encounter (Signed)
Error - duplicate

## 2023-01-08 ENCOUNTER — Encounter: Payer: Self-pay | Admitting: Pulmonary Disease

## 2023-01-08 ENCOUNTER — Ambulatory Visit: Payer: Medicare HMO | Admitting: Pulmonary Disease

## 2023-01-08 VITALS — BP 114/85 | HR 89 | Ht 68.0 in | Wt 337.4 lb

## 2023-01-08 DIAGNOSIS — R0602 Shortness of breath: Secondary | ICD-10-CM

## 2023-01-08 NOTE — Patient Instructions (Addendum)
Schedule echocardiogram  Schedule pulmonary function test  Follow-up in 6 to 8 weeks  Adjust your oxygen levels to maintain saturations greater than 90%  Call us with significant concerns

## 2023-01-08 NOTE — Progress Notes (Signed)
[name]    [id number]    1945/08/03  Primary Care Physician:Williams, Dorene Ar, MD  Referring Physician: [name], MD [address] [place],  [place] [address]  Chief complaint:   Patient being evaluated for shortness of breath  HPI:  Patient seen by Dr. [name] in the past for shortness of breath Was started on oxygen about 3 months ago  Does get short of breath with ambulation  Ongoing concern for pulmonary hypertension  She attributes significant weight gain with antidepressants, this has recently been changed and she has managed to lose about 10 pounds since the change in her medications History of emphysema,, class III severe obesity, major depression Diagnosed with obstructive sleep apnea, on CPAP therapy -Compliant with CPAP use  She is on chronic anticoagulation and Spiriva Respimat  Previous smoker quit over 38 years ago, was smoking a pack a day  Outpatient Encounter Medications as of 01/08/2023  Medication Sig   ALPRAZolam (XANAX) 1 MG tablet Take 0.5 tablets by mouth at bedtime.   buPROPion (WELLBUTRIN XL) 300 MG 24 hr tablet Take 300 mg by mouth daily.   desvenlafaxine (PRISTIQ) 100 MG 24 hr tablet Take 100 mg by mouth every morning.   diclofenac Sodium (VOLTAREN) 1 % GEL APPLY 4 G TOPICALLY 4 TIMES DAILY   furosemide (LASIX) 40 MG tablet Take 1 tablet (40 mg total) by mouth daily.   Multiple Vitamin (MULTIVITAMIN) tablet Take 1 tablet by mouth daily. (Patient taking differently: Take 1 tablet by mouth every evening.)   Omega-3 Fatty Acids (OMEGA-3 FISH OIL PO) Take 1 capsule by mouth every evening.    pravastatin (PRAVACHOL) 40 MG tablet Take 1 tablet (40 mg total) by mouth daily.   rivaroxaban (XARELTO) 20 MG TABS tablet TAKE 1 TABLET EVERY DAY WITH SUPPER   solifenacin (VESICARE) 5 MG tablet Take 5 mg by mouth daily.   Tiotropium Bromide Monohydrate (SPIRIVA RESPIMAT) 2.5 MCG/ACT AERS Inhale 2 puffs into the lungs daily.    traZODone (DESYREL) 50 MG tablet Take 50 mg by mouth at bedtime as needed for sleep.   [DISCONTINUED] amLODipine (NORVASC) 5 MG tablet Take 1 tablet (5 mg total) by mouth daily.   No facility-administered encounter medications on file as of 01/08/2023.    Allergies as of 01/08/2023 - Review Complete 01/08/2023  Allergen Reaction Noted   Lisinopril Cough 09/24/2006    Past Medical History:  Diagnosis Date   Arthritis    osteoarthritis , kness.   Chronic hypoxemic respiratory failure (HCC) 11/18/2013   8/26 CTA >>> extensive b/l PE's with evidence of right heart strain c/w at least submassive PE.  8/26 Echo >>> EF 55%, grade 1 diastolic dysfx, mod/severe RV dilation with mod RV systolic dysfx, PAS 51 mmHg  8/27 LE Dopplers >>>negative 01/2014 Echo> EF 60%, RV "upper limits of normal", cannot estimate RVSP    Chronic kidney disease    Renal insuffucinecy, cr-1.33 in 10/2005.   Depression    followed by Dr. Evelene Croon   Diverticulosis of colon    Embolism and thrombosis of unspecified artery (HCC) 01/11/2020   Fibrocystic breast disease    History of hepatitis B    History of pulmonary embolism 01/2002   Hyperlipidemia    Hypertension    Morbid obesity with body mass index (BMI) of 50.0 to 59.9 in adult [facility]) 01/19/2006        Neck nodule    not erythematous, movable like cyst located  at the base of posterior neck on the left side(not painful), will need follow up for    Obesity    Obstructive sleep apnea    cpap    Pulmonary nodule    Stable on repeat imaging. LLL nodule 5 mm   Vasovagal near-syncope 06/28/2020   Acute episode around 10:30 am when preparing to leave today's visit.  Had been sitting in chair for 2 hours; minimal sleep night prior; last meal was popcorn and sips of water around 1 am.  "I feel dizzy" she noted while sitting - recheck BP not initially measurable, pulses faint, became cool and  diaphoretic and minimally verbally responsive though did not completely lose  consciousness.  Denied d    Past Surgical History:  Procedure Laterality Date   CHOLECYSTECTOMY N/A 08/09/2015   Procedure: LAPAROSCOPIC CHOLECYSTECTOMY WITH INTRAOPERATIVE CHOLANGIOGRAM;  Surgeon: Harriette Bouillon, MD;  Location: [facility] OR;  Service: General;  Laterality: N/A;   FLEXIBLE SIGMOIDOSCOPY N/A 04/29/2018   Procedure: Arnell Sieving;  Surgeon: Carman Ching, MD;  Location: [facility] ENDOSCOPY;  Service: Endoscopy;  Laterality: N/A;   GYN surgery     s/p excison of vulvar cyst 10/18/04 by dr. [name]    Family History  Problem Relation Age of Onset   Hypertension Other        family history   Kidney disease Maternal Uncle    Parkinson's disease Brother    Hypertension Mother     Social History   Socioeconomic History   Marital status: Widowed    Spouse name: Not on file   Number of children: Not on file   Years of education: Not on file   Highest education level: Not on file  Occupational History   Occupation: Retired   Occupation: Disabled  Tobacco Use   Smoking status: Former    Current packs/day: 0.00    Average packs/day: 1 pack/day for 20.0 years (20.0 ttl pk-yrs)    Types: Cigarettes    Start date: 03/08/1964    Quit date: 03/08/1984    Years since quitting: 38.8   Smokeless tobacco: Never  Vaping Use   Vaping status: Never Used  Substance and Sexual Activity   Alcohol use: No    Alcohol/week: 0.0 standard drinks of alcohol   Drug use: No   Sexual activity: Not Currently  Other Topics Concern   Not on file  Social History Narrative   Grew up in government housing. Received college education, had career Education officer, community, Veterinary surgeon, others), and own home before depression dx. Now back in government housing.    Started smoking in her teens and quit in her 60s.  She never smoked more than 1 pack/day.  Likely, less than a 20-pack-year history      Current Social History 07/19/2020      Patient lives alone in a ground floor apartment which is 1 story. There  are not steps up to the entrance the patient uses.       Patient's method of transportation is personal car.      The highest level of education was Bachelor's Degree.      The patient currently retired      Identified important Relationships are "I talk with my sister in [place] every night."       Pets : "I had to put down my 57.5 yo dog in July 2020." Made a goal to look into fostering a pet.       Interests / Fun: TV, play games and  read articles on internet."        Current Stressors: "I don't have any right now."       Religious / Personal Beliefs: "Christian"       L. Ducatte, BSN, RN-BC        Social Determinants of Health   Financial Resource Strain: Low Risk  (09/10/2022)   Overall Financial Resource Strain (CARDIA)    Difficulty of Paying Living Expenses: Not very hard  Food Insecurity: No Food Insecurity (09/10/2022)   Hunger Vital Sign    Worried About Running Out of Food in the Last Year: Never true    Ran Out of Food in the Last Year: Never true  Transportation Needs: No Transportation Needs (09/10/2022)   PRAPARE - Administrator, Civil Service (Medical): No    Lack of Transportation (Non-Medical): No  Physical Activity: Inactive (09/10/2022)   Exercise Vital Sign    Days of Exercise per Week: 0 days    Minutes of Exercise per Session: 0 min  Stress: No Stress Concern Present (09/10/2022)   [employer] of Occupational Health - Occupational Stress Questionnaire    Feeling of Stress : Not at all  Social Connections: Socially Isolated (09/10/2022)   Social Connection and Isolation Panel [NHANES]    Frequency of Communication with Friends and Family: More than three times a week    Frequency of Social Gatherings with Friends and Family: Never    Attends Religious Services: Never    Database administrator or Organizations: No    Attends Banker Meetings: Never    Marital Status: Widowed  Intimate Partner Violence: Not At Risk  (09/10/2022)   Humiliation, Afraid, Rape, and Kick questionnaire    Fear of Current or Ex-Partner: No    Emotionally Abused: No    Physically Abused: No    Sexually Abused: No    Review of Systems  Respiratory:  Positive for apnea and shortness of breath.   Psychiatric/Behavioral:  Positive for sleep disturbance.     There were no vitals filed for this visit.   Physical Exam Constitutional:      Appearance: She is obese.  HENT:     Head: Normocephalic.     Mouth/Throat:     Mouth: Mucous membranes are moist.  Eyes:     General: No scleral icterus. Cardiovascular:     Rate and Rhythm: Normal rate and regular rhythm.     Heart sounds: No murmur heard.    No friction rub.  Pulmonary:     Effort: No respiratory distress.     Breath sounds: No stridor. No wheezing or rhonchi.  Musculoskeletal:     Cervical back: No rigidity or tenderness.  Neurological:     Mental Status: She is alert.  Psychiatric:        Mood and Affect: Mood normal.      Data Reviewed: CPAP compliance reviewed showing excellent compliance with CPAP AutoSet 5-15, EPR of 3 95 percentile pressure of 12.9 Residual AHI of 2.6  Recent CT scan of the chest concerning for pulmonary hypertension, basal interstitial changes, emphysema, bronchiectasis at the bases  Previous pulmonary function test December 2020-spirometry within normal limits but will reduced diffusing capacity  Previous echocardiogram reviewed-suboptimal but no mention of pulmonary hypertension  Assessment:   Chronic hypoxemic respiratory failure -Multifactorial reasons for this will include obstructive lung disease, obstructive sleep apnea, diastolic dysfunction, class III obesity  Obstructive sleep apnea -Compliant with CPAP therapy  History of  antiphospholipid syndrome -Will continue on anticoagulation  Chronic obstructive pulmonary disease -Continue inhaler -Will benefit from repeating pulmonary function test  Severe major  depression -Medications being optimized  Chronic kidney disease  History of prediabetes  Diastolic dysfunction on previous echocardiogram  Concern for pulmonary hypertension based on enlarged pulmonary artery on recent CT, reduced diffusing capacity on previous pulmonary function test.  Ongoing shortness of breath  Plan/Recommendations: Schedule for echocardiogram  Schedule for pulmonary function test  Encouraged to adjust oxygen supplementation to maintain saturations greater than 90% depending on activity  Encouraged to give [place] a call with concerns  Regular exercises, weight loss efforts encouraged Importance of continuing weight loss was discussed with patient as this may continue to contribute to shortness of breath and ability to get around  Follow-up in 6 to 8 weeks   Virl Diamond MD [facility] 01/08/2023, 3:43 PM  CC: [name], MD

## 2023-01-13 ENCOUNTER — Encounter: Payer: Medicare HMO | Admitting: Internal Medicine

## 2023-01-15 ENCOUNTER — Ambulatory Visit: Payer: Medicare HMO | Admitting: Pulmonary Disease

## 2023-01-15 DIAGNOSIS — R0602 Shortness of breath: Secondary | ICD-10-CM | POA: Diagnosis not present

## 2023-01-15 LAB — PULMONARY FUNCTION TEST
DL/VA % pred: 62 %
DL/VA: 2.5 ml/min/mmHg/L
DLCO cor % pred: 44 %
DLCO cor: 9.78 ml/min/mmHg
DLCO unc % pred: 44 %
DLCO unc: 9.78 ml/min/mmHg
FEF 25-75 Post: 2.47 L/s
FEF 25-75 Pre: 1.9 L/s
FEF2575-%Change-Post: 29 %
FEF2575-%Pred-Post: 129 %
FEF2575-%Pred-Pre: 99 %
FEV1-%Change-Post: 4 %
FEV1-%Pred-Post: 79 %
FEV1-%Pred-Pre: 76 %
FEV1-Post: 1.99 L
FEV1-Pre: 1.91 L
FEV1FVC-%Change-Post: 1 %
FEV1FVC-%Pred-Pre: 107 %
FEV6-%Change-Post: 1 %
FEV6-%Pred-Post: 76 %
FEV6-%Pred-Pre: 74 %
FEV6-Post: 2.41 L
FEV6-Pre: 2.37 L
FEV6FVC-%Change-Post: 0 %
FEV6FVC-%Pred-Post: 104 %
FEV6FVC-%Pred-Pre: 104 %
FVC-%Change-Post: 2 %
FVC-%Pred-Post: 73 %
FVC-%Pred-Pre: 71 %
FVC-Post: 2.44 L
FVC-Pre: 2.38 L
Post FEV1/FVC ratio: 81 %
Post FEV6/FVC ratio: 99 %
Pre FEV1/FVC ratio: 80 %
Pre FEV6/FVC Ratio: 100 %
RV % pred: 76 %
RV: 1.9 L
TLC % pred: 86 %
TLC: 4.87 L

## 2023-01-15 NOTE — Patient Instructions (Signed)
Full PFT performed today. °

## 2023-01-15 NOTE — Progress Notes (Signed)
Full PFT performed today. °

## 2023-02-01 ENCOUNTER — Other Ambulatory Visit: Payer: Self-pay | Admitting: Internal Medicine

## 2023-02-01 DIAGNOSIS — R0609 Other forms of dyspnea: Secondary | ICD-10-CM

## 2023-02-03 ENCOUNTER — Ambulatory Visit (INDEPENDENT_AMBULATORY_CARE_PROVIDER_SITE_OTHER): Payer: Medicare HMO | Admitting: Internal Medicine

## 2023-02-03 VITALS — BP 132/79 | HR 72 | Temp 99.3°F | Ht 68.0 in | Wt 335.2 lb

## 2023-02-03 DIAGNOSIS — R4189 Other symptoms and signs involving cognitive functions and awareness: Secondary | ICD-10-CM

## 2023-02-03 NOTE — Progress Notes (Signed)
77 y.o. [name] is here for routine follow-up of pulmonary evaluation (gradually/chronically worsening dyspnea and hypoxic respiratory failure). We had also planned to spend time discussing her memory concerns.  Since last visit patient has seen pulmonologist for first consultation, whom she recalls also mentioned pulmonary HTN as the most likely cause for her dyspnea and hypoxia. Has had PFTs; echo scheduled for 03/02/23; is to f/u with pulmonology 6-8 wks from consultation visit.    Still lying in bed, doesn't want to get up.  Appetite is poor, she doesn't miss eating.  Heart beats faster with activity and with exertional hypoxia.   Regarding memory concerns, [name] has had some problems recalling names of acquaintances, and wonders if she could be developing dementia.   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 arthritis 06/05/2021   Multinodular goiter 06/21/2019   Centrilobular emphysema (HCC) 03/12/2019   Abdominal aortic atherosclerosis (HCC) 02/13/2019   No natural teeth 12/18/2016   Exertional dyspnea with hypoxia    Mixed incontinence 04/05/2015   Osteopenia 03/08/2014   Prediabetes 04/04/2013   Preventative health care 10/21/2010   Hyperlipidemia 03/07/2006   Class 3 severe obesity due to excess calories with serious comorbidity and body mass index (BMI) of 50.0 to 59.9 in adult (HCC) 01/19/2006   Severe major depression (HCC) 01/19/2006   OSA (obstructive sleep apnea) 01/19/2006   History of hypertension 01/19/2006   OA (osteoarthritis) of knee 01/19/2006   History of hepatitis B 01/19/2006   Personal history of submassive PE 2015 requiring tpa; on lifelong  anticoagulation 01/19/2006    Current Outpatient Medications:    ALPRAZolam (XANAX) 1 MG tablet, Take 0.5 tablets by mouth at bedtime., Disp: , Rfl:    buPROPion (WELLBUTRIN XL) 300 MG 24 hr tablet, Take 300 mg by mouth daily., Disp: , Rfl:    desvenlafaxine (PRISTIQ) 100 MG 24 hr tablet, Take 100 mg by mouth every morning., Disp: , Rfl:    diclofenac Sodium (VOLTAREN) 1 % GEL, APPLY 4 G TOPICALLY 4 TIMES DAILY, Disp: 100 g, Rfl: 1   furosemide (LASIX) 40 MG tablet, Take 1 tablet (40 mg total) by mouth daily., Disp: 30 tablet, Rfl: 11   Multiple Vitamin (MULTIVITAMIN) tablet, Take 1 tablet by mouth daily. (Patient taking differently: Take 1 tablet by mouth every evening.), Disp: 30 tablet, Rfl: 3   Omega-3 Fatty Acids (OMEGA-3 FISH OIL PO), Take 1 capsule by mouth every evening. , Disp: , Rfl:    pravastatin (PRAVACHOL) 40 MG tablet, Take 1 tablet (40 mg total) by mouth daily., Disp: 90 tablet, Rfl: 3   rivaroxaban (XARELTO) 20 MG TABS tablet, TAKE 1 TABLET EVERY DAY WITH SUPPER, Disp: 90 tablet, Rfl: 3   solifenacin (VESICARE) 5 MG tablet, Take 5 mg by mouth daily., Disp: , Rfl:    Tiotropium Bromide Monohydrate (SPIRIVA RESPIMAT) 2.5 MCG/ACT AERS, Inhale 2 puffs into the lungs daily., Disp: 4 g, Rfl: 3   traZODone (DESYREL) 50 MG tablet, Take 50 mg by mouth at bedtime as needed for sleep., Disp: , Rfl:   Functional Status: Manages all IADLs.  Doesn't drive.  ADLs are limited by mobility impairment and dyspnea and oxygen cord, though she can manage toileting and dressing;  she does have an aide who assists on a certain schedule.  Still not getting out of the house much.    Objective BP (!) 143/75 (BP Location: Left Arm, Patient Position: Sitting, Cuff Size: Small)   Pulse 79   Temp 99.3 F (37.4 C) (Oral)   Ht 5\' 8"  (1.727 m)   Wt (!) 335 lb 3.2 oz (152 kg)   SpO2 98% Comment: on 2 liters of oxygen  BMI 50.97 kg/m   Exam: Appearing well; her weight loss is very visible.  Wearing o2 [place],  not in respiratory distress, not dyspneic with speech; mild fingernail bed cyanosis.   Assessment and Plan: There are no diagnoses linked to this encounter.   No follow-ups on file.

## 2023-02-03 NOTE — Patient Instructions (Addendum)
[name],  I'm glad we were able to catch up today and review your appointments with the pulmonologist.  We also had a chance to do a cognitive screening test.  You scored normal on your test called the MoCA!  We'll check in on this periodically.  I look forward to hearing what the pulmonologist says after your January appointment.  I'll call you after that and we can review his visit together.  Take care and stay well!  Dr Mayford Knife

## 2023-02-13 DIAGNOSIS — R4189 Other symptoms and signs involving cognitive functions and awareness: Secondary | ICD-10-CM | POA: Insufficient documentation

## 2023-02-13 NOTE — Assessment & Plan Note (Signed)
Experiences forgetfulness when recalling names of acquaintances.  Has had no difficulty with tasks and keeps up well with her medications, refills, appointments, and medical concerns.  Perhaps slightly worse over time, but not interfering with her daily activities.  MoCA screen today 29/30 (includes 1 pt for education level).  Initially struggled with registration of 5 words but recalled 4 of 5 of them easily later, and the 5th was quickly recalled with category cue.  Visuospatial tasks were completed quickly and accurately. 1 point lost in language repetition and 1 point lost in delayed recall.  Normal screen.  Reassurance given - we'll f/u on this annually and as concerns arise.

## 2023-03-02 ENCOUNTER — Ambulatory Visit (HOSPITAL_COMMUNITY)
Admission: RE | Admit: 2023-03-02 | Discharge: 2023-03-02 | Disposition: A | Discharge status: Home / Self Care | Payer: Medicare HMO | Source: Ambulatory Visit | Attending: Pulmonary Disease | Admitting: Pulmonary Disease

## 2023-03-02 DIAGNOSIS — R0602 Shortness of breath: Secondary | ICD-10-CM

## 2023-03-02 DIAGNOSIS — I2789 Other specified pulmonary heart diseases: Secondary | ICD-10-CM | POA: Diagnosis not present

## 2023-03-02 DIAGNOSIS — E785 Hyperlipidemia, unspecified: Secondary | ICD-10-CM | POA: Diagnosis not present

## 2023-03-02 DIAGNOSIS — I3139 Other pericardial effusion (noninflammatory): Secondary | ICD-10-CM | POA: Diagnosis not present

## 2023-03-02 DIAGNOSIS — I10 Essential (primary) hypertension: Secondary | ICD-10-CM | POA: Diagnosis not present

## 2023-03-02 DIAGNOSIS — I272 Pulmonary hypertension, unspecified: Secondary | ICD-10-CM | POA: Diagnosis not present

## 2023-03-02 LAB — ECHOCARDIOGRAM COMPLETE
AR max vel: 1.87 cm2
AV Area VTI: 2.03 cm2
AV Area mean vel: 1.68 cm2
AV Mean grad: 3 mm[Hg]
AV Peak grad: 5.9 mm[Hg]
Ao pk vel: 1.21 m/s
Area-P 1/2: 2.22 cm2
Calc EF: 60.6 %
MV VTI: 2.37 cm2
S' Lateral: 2.5 cm
Single Plane A2C EF: 56 %
Single Plane A4C EF: 62 %

## 2023-03-02 NOTE — Progress Notes (Signed)
  Echocardiogram 2D Echocardiogram has been performed.  Ocie Doyne RDCS 03/02/2023, 11:48 AM

## 2023-03-11 ENCOUNTER — Ambulatory Visit: Payer: Medicare HMO | Admitting: Primary Care

## 2023-03-11 ENCOUNTER — Encounter: Payer: Self-pay | Admitting: Primary Care

## 2023-03-11 VITALS — BP 122/64 | HR 84 | Temp 98.8°F | Ht 68.0 in | Wt 343.8 lb

## 2023-03-11 DIAGNOSIS — J9611 Chronic respiratory failure with hypoxia: Secondary | ICD-10-CM

## 2023-03-11 DIAGNOSIS — I5189 Other ill-defined heart diseases: Secondary | ICD-10-CM | POA: Diagnosis not present

## 2023-03-11 DIAGNOSIS — I3139 Other pericardial effusion (noninflammatory): Secondary | ICD-10-CM

## 2023-03-11 DIAGNOSIS — R0602 Shortness of breath: Secondary | ICD-10-CM

## 2023-03-11 DIAGNOSIS — J438 Other emphysema: Secondary | ICD-10-CM

## 2023-03-11 NOTE — Patient Instructions (Addendum)
 Recommendations: Continue to wear O2 24/7  Continue to wear CPAP at night 4-6 hours Work on weight loss, discuss medication with PCP re: OSA and obesity   Orders: Walk test today on forehead probe 1 lap to assess levels on 2L   Refer: Pulmonary rehab (ordered)  Refer: Cardiology (ordered)   Follow-up 2-3 months with Dr. Neda

## 2023-03-11 NOTE — Progress Notes (Signed)
 @Patient  ID: [name], female    DOB: 1946-02-15, 78 y.o.   MRN: [id number]  No chief complaint on file.   Referring provider: Trudy Mliss Dragon, MD  HPI: 78 year old female, former smoker quit in 1986 (20-pack-year history).  Past medical history significant for OSA, centrilobular emphysema, osteoarthritis, hyperlipidemia, obesity.  01/07/13- Dr. Neda Patient seen by Dr. Kassie in the past for shortness of breath Was started on oxygen  about 3 months ago  Does get short of breath with ambulation  Ongoing concern for pulmonary hypertension  She attributes significant weight gain with antidepressants, this has recently been changed and she has managed to lose about 10 pounds since the change in her medications History of emphysema,, class III severe obesity, major depression Diagnosed with obstructive sleep apnea, on CPAP therapy -Compliant with CPAP use  She is on chronic anticoagulation and Spiriva  Respimat  Previous smoker quit over 38 years ago, was smoking a pack a day     03/11/2023- interim hx  Discussed the use of AI scribe software for clinical note transcription with the patient, who gave verbal consent to proceed.  Patient presents today for 6-8 week follow-up. The patient, with a history of sleep apnea, obesity, and diastolic dysfunction, presented for a follow-up visit after undergoing a breathing test and echocardiogram. Patient was last seen in November by Dr. Neda for shortness of breath, concern for pulmonary HTN based on enlarged pulmonary artery on CT imaging. She is maintained on CPAP for OSA and supplemental oxygen  for chronic respiratory failure.   The patient reported experiencing shortness of breath, which was the primary concern. The patient noted that shortness of breath was particularly noticeable upon exertion, even when using prescribed oxygen . The patient also reported a heart rate increase to 170 during these episodes. The  echocardiogram revealed a grade one diastolic dysfunction, indicating mild heart failure, and a small fluid collection in the pericardial space of the heart. The breathing test showed mild restriction along with diffusion defect.   The patient has been using a CPAP machine for sleep apnea and has been compliant with the treatment. The patient also reported a long history of smoking, although she quit many years ago. The patient has been taking Spiriva , but it was unclear whether this medication was providing any relief for the patient's symptoms.  The patient has been making efforts to lose weight and has been successful in this endeavor. However, the patient spends most of the time in bed due to the shortness of breath and the subsequent oxygen  needs. The patient expressed interest in a pulmonary rehab program to improve conditioning and manage symptoms better.      Airview download 02/08/23-03/09/23 Usage days 30/30 (100%); 30 days  Average usage 8 hours 58 mins Pressure 5-15cm h20 (13.6cm h20-95%) Airleaks 44.1L/min (95%) AHI 3.3    Allergies  Allergen Reactions   Lisinopril Cough    Immunization History  Administered Date(s) Administered   Fluad Trivalent(High Dose 65+) 12/30/2022   Influenza Split 12/25/2010   Influenza Whole 01/15/2009, 11/26/2009   Influenza, Seasonal, Injecte, Preservative Fre 01/27/2012   Influenza,inj,Quad PF,6+ Mos 12/27/2012, 11/14/2013, 04/05/2015, 11/22/2015, 11/28/2016, 02/04/2018, 01/20/2019, 01/05/2020   PFIZER(Purple Top)SARS-COV-2 Vaccination 05/30/2019, 06/22/2019, 04/10/2020   PPD Test 02/04/2019   Pneumococcal Conjugate-13 07/27/2014   Pneumococcal Polysaccharide-23 12/27/2012   Td 01/15/2009   Tdap 03/15/2020    Past Medical History:  Diagnosis Date   Arthritis    osteoarthritis , kness.   Chronic hypoxemic respiratory failure (HCC) 11/18/2013  8/26 CTA >>> extensive b/l PE's with evidence of right heart strain c/w at least submassive PE.   8/26 Echo >>> EF 55%, grade 1 diastolic dysfx, mod/severe RV dilation with mod RV systolic dysfx, PAS 51 mmHg  8/27 LE Dopplers >>>negative 01/2014 Echo> EF 60%, RV upper limits of normal, cannot estimate RVSP    Chronic kidney disease    Renal insuffucinecy, cr-1.33 in 10/2005.   Depression    followed by Dr. 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 LLL nodule 5 mm   Vasovagal near-syncope 06/28/2020   Acute episode around 10:30 am when preparing to leave today's visit.  Had been sitting in chair for 2 hours; minimal sleep night prior; last meal was popcorn and sips of water around 1 am.  I feel dizzy she noted while sitting - recheck BP not initially measurable, pulses faint, became cool and  diaphoretic and minimally verbally responsive though did not completely lose consciousness.  Denied d    Tobacco History: Social History   Tobacco Use  Smoking Status Former   Current packs/day: 0.00   Average packs/day: 1 pack/day for 20.0 years (20.0 ttl pk-yrs)   Types: Cigarettes   Start date: 03/08/1964   Quit date: 03/08/1984   Years since quitting: 39.0  Smokeless Tobacco Never   Counseling given: Not Answered   Outpatient Medications Prior to Visit  Medication Sig Dispense Refill   ALPRAZolam  (XANAX ) 1 MG tablet Take 0.5 tablets by mouth at bedtime.     buPROPion  (WELLBUTRIN  XL) 300 MG 24 hr tablet Take 300 mg by mouth daily.     desvenlafaxine  (PRISTIQ ) 100 MG 24 hr  tablet Take 100 mg by mouth every morning.     diclofenac  Sodium (VOLTAREN ) 1 % GEL APPLY 4 G TOPICALLY 4 TIMES DAILY 100 g 1   furosemide  (LASIX ) 40 MG tablet Take 1 tablet (40 mg total) by mouth daily. 30 tablet 11   Multiple Vitamin (MULTIVITAMIN) tablet Take 1 tablet by mouth daily. (Patient taking differently: Take 1 tablet by mouth every evening.) 30 tablet 3   Omega-3 Fatty Acids (OMEGA-3 FISH OIL PO) Take 1 capsule by mouth every evening.      pravastatin  (PRAVACHOL ) 40 MG tablet Take 1 tablet (40 mg total) by mouth daily. 90 tablet 3   rivaroxaban  (XARELTO ) 20 MG TABS tablet TAKE 1 TABLET EVERY DAY WITH SUPPER 90 tablet 3   solifenacin (VESICARE) 5 MG tablet Take 5 mg by mouth daily.     SPIRIVA  RESPIMAT 2.5 MCG/ACT AERS INHALE 2 PUFFS INTO THE LUNGS DAILY 4 g 3   traZODone  (DESYREL ) 50 MG tablet Take 50 mg by mouth at bedtime as needed for sleep.     No facility-administered medications prior to visit.   Review of Systems  Review of Systems  Constitutional: Negative.  HENT: Negative.    Respiratory:  Positive for shortness of breath.   Cardiovascular: Negative.    Physical Exam  There were no vitals taken for this visit. Physical Exam Constitutional:      General: She is not in acute distress.    Appearance: Normal appearance. She is obese. She is not ill-appearing.  HENT:     Head: Normocephalic and atraumatic.     Mouth/Throat:     Mouth: Mucous membranes are moist.     Pharynx: Oropharynx is clear.  Cardiovascular:     Rate and Rhythm: Normal rate and regular rhythm.  Pulmonary:     Effort: Pulmonary effort is normal.     Breath sounds: Normal breath sounds.  Musculoskeletal:        General: Normal range of motion.  Skin:    General: Skin is warm and dry.  Neurological:     General: No focal deficit present.     Mental Status: She is alert and oriented to person, place, and time. Mental status is at baseline.  Psychiatric:        Mood and Affect: Mood  normal.        Behavior: Behavior normal.        Thought Content: Thought content normal.        Judgment: Judgment normal.      Lab Results:  CBC    Component Value Date/Time   WBC 5.9 08/28/2022 1051   WBC 7.1 12/03/2016 0024   RBC 5.71 (H) 08/28/2022 1051   RBC 4.92 12/03/2016 0024   HGB 15.1 08/28/2022 1051   HCT 46.2 08/28/2022 1051   PLT 230 08/28/2022 1051   MCV 81 08/28/2022 1051   MCH 26.4 (L) 08/28/2022 1051   MCH 26.6 12/03/2016 0024   MCHC 32.7 08/28/2022 1051   MCHC 33.3 12/03/2016 0024   RDW 14.2 08/28/2022 1051   LYMPHSABS 1.6 08/28/2022 1051   MONOABS 0.7 08/11/2015 0527   EOSABS 0.1 08/28/2022 1051   BASOSABS 0.0 08/28/2022 1051    BMET    Component Value Date/Time   NA 141 12/30/2022 1032   K 3.7 12/30/2022 1032   CL 101 12/30/2022 1032   CO2 24 12/30/2022 1032   GLUCOSE 99 12/30/2022 1032   GLUCOSE 108 (H) 12/03/2016 0024   BUN 7 (L) 12/30/2022 1032   CREATININE 0.99 12/30/2022 1032   CREATININE 1.10 07/27/2014 1105   CALCIUM 9.0 12/30/2022 1032   GFRNONAA 45 (L) 04/19/2020 1228   GFRNONAA 52 (L) 07/27/2014 1105   GFRAA 52 (L) 04/19/2020 1228   GFRAA 60 07/27/2014 1105    BNP    Component Value Date/Time   BNP 123.8 (H) 09/22/2022 1636    ProBNP    Component Value Date/Time   PROBNP 1,894.0 (H) 10/26/2013 1339    Imaging: ECHOCARDIOGRAM COMPLETE Result Date: 03/02/2023    ECHOCARDIOGRAM REPORT   Patient Name:   ANTHONY ROLAND Cundy Date of Exam: 03/02/2023 Medical Rec #:  [id number]                Height:       68.0 in Accession #:    [id number]               Weight:       335.2 lb Date of Birth:  Jun 30, 1945                BSA:          2.545 m Patient Age:  77 years                 BP:           132/79 mmHg Patient Gender: F                        HR:           82 bpm. Exam Location:  Outpatient Procedure: 2D Echo, Cardiac Doppler and Color Doppler Indications:    Pulmonary hypertension  History:        Patient has prior  history of Echocardiogram examinations, most                 recent 10/21/2018. Signs/Symptoms:Dyspnea; Risk                 Factors:Hypertension and Dyslipidemia.  Sonographer:    Juanita Shaw Referring Phys: [id number] ADEWALE A OLALERE IMPRESSIONS  1. Left ventricular ejection fraction, by estimation, is 50 to 55%. The left ventricle has low normal function. The left ventricle has no regional wall motion abnormalities. Left ventricular diastolic parameters are consistent with Grade I diastolic dysfunction (impaired relaxation). There is the interventricular septum is flattened in systole and diastole, consistent with right ventricular pressure and volume overload.  2. Right ventricular systolic function was not well visualized. The right ventricular size is not well visualized. Tricuspid regurgitation signal is inadequate for assessing PA pressure.  3. A small pericardial effusion is present. The pericardial effusion is posterior to the left ventricle.  4. The mitral valve is grossly normal. No evidence of mitral valve regurgitation. No evidence of mitral stenosis.  5. The aortic valve is tricuspid. Aortic valve regurgitation is not visualized. No aortic stenosis is present.  6. The inferior vena cava is normal in size with greater than 50% respiratory variability, suggesting right atrial pressure of 3 mmHg. Comparison(s): No significant change from prior study. FINDINGS  Left Ventricle: Left ventricular ejection fraction, by estimation, is 50 to 55%. The left ventricle has low normal function. The left ventricle has no regional wall motion abnormalities. The left ventricular internal cavity size was normal in size. There is no left ventricular hypertrophy. The interventricular septum is flattened in systole and diastole, consistent with right ventricular pressure and volume overload. Left ventricular diastolic parameters are consistent with Grade I diastolic dysfunction (impaired relaxation). Right Ventricle: The  right ventricular size is not well visualized. Right vetricular wall thickness was not well visualized. Right ventricular systolic function was not well visualized. Tricuspid regurgitation signal is inadequate for assessing PA pressure. Left Atrium: Left atrial size was normal in size. Right Atrium: Right atrial size was not well visualized. Pericardium: A small pericardial effusion is present. The pericardial effusion is posterior to the left ventricle. Mitral Valve: The mitral valve is grossly normal. No evidence of mitral valve regurgitation. No evidence of mitral valve stenosis. MV peak gradient, 2.7 mmHg. The mean mitral valve gradient is 1.0 mmHg. Tricuspid Valve: The tricuspid valve is grossly normal. Tricuspid valve regurgitation is trivial. No evidence of tricuspid stenosis. Aortic Valve: The aortic valve is tricuspid. Aortic valve regurgitation is not visualized. No aortic stenosis is present. Aortic valve mean gradient measures 3.0 mmHg. Aortic valve peak gradient measures 5.9 mmHg. Aortic valve area, by VTI measures 2.03 cm. Pulmonic Valve: The pulmonic valve was grossly normal. Pulmonic valve regurgitation is trivial. No evidence of pulmonic stenosis. Aorta: The aortic root and ascending aorta are structurally normal, with no evidence of dilitation.  Venous: The inferior vena cava is normal in size with greater than 50% respiratory variability, suggesting right atrial pressure of 3 mmHg. IAS/Shunts: The interatrial septum was not well visualized.  LEFT VENTRICLE PLAX 2D LVIDd:         3.50 cm      Diastology LVIDs:         2.50 cm      LV e' medial:    4.62 cm/s LV PW:         0.90 cm      LV E/e' medial:  10.8 LV IVS:        0.90 cm      LV e' lateral:   3.97 cm/s LVOT diam:     1.90 cm      LV E/e' lateral: 12.6 LV SV:         43 LV SV Index:   17 LVOT Area:     2.84 cm  LV Volumes (MOD) LV vol d, MOD A2C: 138.0 ml LV vol d, MOD A4C: 152.0 ml LV vol s, MOD A2C: 60.7 ml LV vol s, MOD A4C: 57.8 ml LV  SV MOD A2C:     77.3 ml LV SV MOD A4C:     152.0 ml LV SV MOD BP:      93.0 ml RIGHT VENTRICLE             IVC RV S prime:     14.70 cm/s  IVC diam: 1.00 cm TAPSE (M-mode): 2.6 cm LEFT ATRIUM             Index LA diam:        4.00 cm 1.57 cm/m LA Vol (A2C):   59.2 ml 23.26 ml/m LA Vol (A4C):   70.4 ml 27.66 ml/m LA Biplane Vol: 66.8 ml 26.24 ml/m  AORTIC VALVE                    PULMONIC VALVE AV Area (Vmax):    1.87 cm     PV Vmax:       0.89 m/s AV Area (Vmean):   1.68 cm     PV Peak grad:  3.2 mmHg AV Area (VTI):     2.03 cm AV Vmax:           121.00 cm/s AV Vmean:          84.700 cm/s AV VTI:            0.214 m AV Peak Grad:      5.9 mmHg AV Mean Grad:      3.0 mmHg LVOT Vmax:         79.80 cm/s LVOT Vmean:        50.200 cm/s LVOT VTI:          0.153 m LVOT/AV VTI ratio: 0.71  AORTA Ao Root diam: 2.90 cm Ao Asc diam:  2.90 cm MITRAL VALVE MV Area (PHT): 2.22 cm    SHUNTS MV Area VTI:   2.37 cm    Systemic VTI:  0.15 m MV Peak grad:  2.7 mmHg    Systemic Diam: 1.90 cm MV Mean grad:  1.0 mmHg MV Vmax:       0.82 m/s MV Vmean:      51.1 cm/s MV Decel Time: 342 msec MV E velocity: 49.90 cm/s MV A velocity: 91.20 cm/s MV E/A ratio:  0.55 Darryle Decent MD Electronically signed by Darryle Decent MD Signature Date/Time: 03/02/2023/2:00:43 PM  Final      Assessment & Plan:   1. Shortness of breath (Primary) - Ambulatory referral to Cardiology  2. Diastolic dysfunction - Ambulatory referral to Cardiology - AMB referral to pulmonary rehabilitation  3. Pericardial effusion - Ambulatory referral to Cardiology  4. Other emphysema (HCC) - AMB referral to pulmonary rehabilitation  5. Chronic respiratory failure with hypoxia (HCC) - AMB referral to pulmonary rehabilitation     Shortness of Breath Multifactorial etiology including obesity, restrictive lung disease, diastolic dysfunction, pericardial effusion, possible pulmonary HTN and sleep apnea. No significant changes in echocardiogram  compared to previous study in 2020; EF 50-55%, grade 1 DD, small pericardial effusion, unable to assess PA pressure. Mild emphysema noted on CT scan but no evidence of COPD on pulmonary function test. -Refer to cardiologist for further evaluation of diastolic dysfunction and potential pulmonary hypertension.  Diastolic Dysfunction (Heart Failure) Grade 1 diastolic dysfunction noted on echocardiogram, unchanged from previous study. Small pericardial effusion also noted. -Refer to cardiologist for further management.  Obesity Contributing to restrictive lung disease  -Encourage weight loss. Discuss weight loss medication with primary care provider for treatment of sleep apnea.  Sleep Apnea - Well controlled with CPAP use. Pressure 5-15cm h20 (13.6cm h20-95%); Residual AHI 3.3/hour. Continue CPAP use nightly.  Mild emphysema  - Patient currently using Spiriva , but no evidence of COPD on pulmonary function test. Consider discontinuing Spiriva  if not beneficial.  Chronic respiratory failure - Continue 2L oxygen  24/7 and CPAP at night. Refer to pulmonary rehabilitation   Antiphospholipid Syndrome - History of blood clots, currently on anticoagulation. Continue current anticoagulation regimen.  Follow-up Continue follow-up with Dr. Neda for sleep apnea management.      Almarie LELON Ferrari, NP 03/11/2023

## 2023-03-16 ENCOUNTER — Other Ambulatory Visit: Payer: Self-pay | Admitting: *Deleted

## 2023-03-16 DIAGNOSIS — J439 Emphysema, unspecified: Secondary | ICD-10-CM

## 2023-03-24 ENCOUNTER — Other Ambulatory Visit: Payer: Self-pay

## 2023-03-25 MED ORDER — RIVAROXABAN 20 MG PO TABS: ORAL_TABLET | ORAL | 3 refills | Status: DC

## 2023-04-10 ENCOUNTER — Telehealth (HOSPITAL_COMMUNITY): Payer: Self-pay | Admitting: *Deleted

## 2023-04-10 ENCOUNTER — Encounter (HOSPITAL_COMMUNITY): Payer: Self-pay | Admitting: *Deleted

## 2023-04-10 NOTE — Progress Notes (Signed)
 Referred to Pulmonary rehab by Dr. Neda with the diagnosis of chronic respiratory failure with hypoxia. Clinical review of pt follow up appt on 03/11/23 with Almarie Ferrari, NP - pulmonary office note. Pt appropriate for scheduling for on site Pulmonary Rehab.   Called pt and discussed program. She is interested and scheduled for orientation Friday 04/24/23 at 1 pm. She will begin exercise 04/30/23 at 1:15.   Will forward to Estate Manager/land Agent  for insurance verification.  Aliene Aris BS, ACSM-CEP 04/10/2023 1:46 PM

## 2023-04-13 ENCOUNTER — Telehealth (HOSPITAL_COMMUNITY): Payer: Self-pay

## 2023-04-13 NOTE — Telephone Encounter (Signed)
 Pt insurance is active and benefits verified through [facility]. Co-pay $30.00, DED $0.00/$0.00 met, out of pocket $6,750.00/$0.00 met, co-insurance 0%. No pre-authorization required. [employer], 04/13/23 @ 4:08PM, XLK#[id number]

## 2023-04-13 NOTE — Telephone Encounter (Signed)
 Pt insurance is active and benefits verified through [facility]. Co-pay $30.00, DED $0.00/$0.00 met, out of pocket $6,750.00/$35.05 met, co-insurance 0%. No pre-authorization required. [employer], 04/13/23 @ 4:08PM, ZOX#[phone]

## 2023-04-20 ENCOUNTER — Telehealth (HOSPITAL_COMMUNITY): Payer: Self-pay

## 2023-04-20 NOTE — Telephone Encounter (Signed)
Called pt to inform her she would have a $30 copay per session for Pulmonary rehab. She stated she would not be able to participate at this time. We will close her referral.

## 2023-04-20 NOTE — Telephone Encounter (Signed)
Dr. Wynona Neat, [name]'s insurance was ran and her Copay for Pulmonary rehab is $30 per session. She is not going to be able to afford Pulmonary rehab at this time. We are going to close her referral.

## 2023-04-24 ENCOUNTER — Ambulatory Visit (HOSPITAL_COMMUNITY): Payer: Medicare HMO

## 2023-04-30 ENCOUNTER — Ambulatory Visit (HOSPITAL_COMMUNITY): Payer: Medicare HMO

## 2023-05-05 ENCOUNTER — Ambulatory Visit (HOSPITAL_COMMUNITY): Payer: Medicare HMO

## 2023-05-07 ENCOUNTER — Ambulatory Visit (HOSPITAL_COMMUNITY): Payer: Medicare HMO

## 2023-05-12 ENCOUNTER — Ambulatory Visit (HOSPITAL_COMMUNITY): Payer: Medicare HMO

## 2023-05-14 ENCOUNTER — Ambulatory Visit (HOSPITAL_COMMUNITY): Payer: Medicare HMO

## 2023-05-15 ENCOUNTER — Ambulatory Visit: Payer: Medicare HMO | Attending: Cardiology | Admitting: Cardiovascular Disease

## 2023-05-15 ENCOUNTER — Encounter: Payer: Self-pay | Admitting: Cardiovascular Disease

## 2023-05-15 VITALS — BP 118/70 | HR 78 | Ht 68.0 in | Wt 330.0 lb

## 2023-05-15 DIAGNOSIS — Z0000 Encounter for general adult medical examination without abnormal findings: Secondary | ICD-10-CM

## 2023-05-15 DIAGNOSIS — I272 Pulmonary hypertension, unspecified: Secondary | ICD-10-CM

## 2023-05-15 DIAGNOSIS — I5032 Chronic diastolic (congestive) heart failure: Secondary | ICD-10-CM | POA: Diagnosis not present

## 2023-05-15 DIAGNOSIS — Z86711 Personal history of pulmonary embolism: Secondary | ICD-10-CM

## 2023-05-15 NOTE — Patient Instructions (Signed)
 Testing/Procedures: Ambulatory referral to Heart Failure Clinic   Follow-Up: At [facility], you and your health needs are our priority.  As part of our continuing mission to provide you with exceptional heart care, we have created designated Provider Care Teams.  These Care Teams include your primary Cardiologist (physician) and Advanced Practice Providers (APPs -  Physician Assistants and Nurse Practitioners) who all work together to provide you with the care you need, when you need it.  Your next appointment:   As Needed  Provider:   Kristeen Miss, MD   1st Floor: - Lobby - Registration  - Pharmacy  - Lab - Cafe  2nd Floor: - PV Lab - Diagnostic Testing (echo, CT, nuclear med)  3rd Floor: - Vacant  4th Floor: - TCTS (cardiothoracic surgery) - AFib Clinic - Structural Heart Clinic - Vascular Surgery  - Vascular Ultrasound  5th Floor: - HeartCare Cardiology (general and EP) - Clinical Pharmacy for coumadin, hypertension, lipid, weight-loss medications, and med management appointments    Valet parking services will be available as well.

## 2023-05-15 NOTE — Progress Notes (Addendum)
 Cardiology Office Note:  .   Date:  05/15/2023  ID:  [name], DOB 1945-10-16, MRN [id number] PCP: Miguel Aschoff, MD  [facility] Providers Cardiologist:  None    History of Present Illness: .   [name] is a 78 y.o. female with hx of obesity and dyspnea.  We are asked to see her for further evaluation and management of her shortness of breath. Hx of DVT, PE  Is on Xarelto   Former smoker , hx of OSA  On home O2  Hx of severe obesity   She has had several echocardiogram over the years.  Most recent echocardiogram from March 02, 2023 shows low normal left ventricular systolic function with EF of 50 to 55%. Grade 1 diastolic dysfunction There is flattening of the interventricular septum during systole and diastole consistent with RV pressure and volume overload. RV function was not well-seen.  RV size was not well-visualized.  There was no significant tricuspid regurgitation to measure pulmonary pressures.   No CP , Uses rolater walker for long distance walking   Dyspnea is persistent .   She is slowly losing weight  Her oxygen level falls with minimal exertion   She had a heart cath years ago ( > 20 years ago ) and showed " no issues "   She was seen by pulmonary medicine and was told her lungs were fine She was referred her by pulmonary to have her heart evaluated   Has OSA, Wears her CPAP at night .    ROS:   Studies Reviewed: [name]    EKG Interpretation Date/Time:  Friday May 15 2023 10:27:12 EDT Ventricular Rate:  78 PR Interval:  158 QRS Duration:  74 QT Interval:  390 QTC Calculation: 444 R Axis:   -11  Text Interpretation: Normal sinus rhythm Anterior infarct (cited on or before 03-Dec-2016) When compared with ECG of 03-Dec-2016 11:46, No significant change was found Confirmed by Kristeen Miss ([id number]) on 05/15/2023 2:57:24 PM     Risk Assessment/Calculations:             Physical Exam:   VS:  BP  118/70   Pulse 78   Ht 5\' 8"  (1.727 m)   Wt (!) 330 lb (149.7 kg)   SpO2 94%   BMI 50.18 kg/m    Wt Readings from Last 3 Encounters:  05/15/23 (!) 330 lb (149.7 kg)  03/11/23 (!) 343 lb 12.8 oz (155.9 kg)  02/03/23 (!) 335 lb 3.2 oz (152 kg)    GEN: morbidly obese, elderly female  in no acute distress, generally very weak .  Was not able to get up on the exam table  NECK: No JVD; No carotid bruits CARDIAC: RRR,  soft systolic, murur  RESPIRATORY:  Clear to auscultation without rales, wheezing or rhonchi  ABDOMEN: Soft, non-tender, non-distended EXTREMITIES:  No edema; No deformity   ASSESSMENT AND PLAN: .   Severe shortness of breath: [name] has progressive shortness of breath and has had progressive shortness of breath for years.  She has a history of pulmonary emboli.  She has known obstructive sleep apnea and is more morbid obesity.  She has been seen by pulmonary and was told that it was not a pulmonary issue.  Echocardiogram shows normal left ventricular systolic function.  She has grade 1 diastolic dysfunction.  Last echocardiogram suggested flattening of the interventricular septum both with systole and diastole suggestive of pulmonary hypertension.  There was not enough tricuspid  regurgitation to measure the pulmonary pressure.  She was started on Lasix.  She continues to be short of breath but has developed incontinence because of the Lasix.  I suspect that her issue is obesity hypoventilation I am not sure that there is anything that we would have to offer from a cardiac standpoint.  I have offered to send her to our doctors that manage pulmonary hypertension in the advanced heart failure clinic.  She would like to see them for follow-up visit.  She was referred to the cardiac rehab department.  She is not able to afford the sessions.  She has profound weakness.  Was not able to get up on the exam table   2.  Morbid obesity:   she has been losing weight .   I've  advised continue diet restrictions.  She is not really able to exercise        Dispo: with CHF clinic    Signed, Kristeen Miss, MD

## 2023-05-18 ENCOUNTER — Ambulatory Visit: Payer: Medicare HMO | Admitting: Pulmonary Disease

## 2023-05-18 ENCOUNTER — Encounter: Payer: Self-pay | Admitting: Pulmonary Disease

## 2023-05-18 VITALS — BP 134/78 | HR 71 | Ht 68.0 in | Wt 329.2 lb

## 2023-05-18 DIAGNOSIS — G4733 Obstructive sleep apnea (adult) (pediatric): Secondary | ICD-10-CM | POA: Diagnosis not present

## 2023-05-18 DIAGNOSIS — J9611 Chronic respiratory failure with hypoxia: Secondary | ICD-10-CM | POA: Diagnosis not present

## 2023-05-18 DIAGNOSIS — I5189 Other ill-defined heart diseases: Secondary | ICD-10-CM | POA: Diagnosis not present

## 2023-05-18 DIAGNOSIS — J432 Centrilobular emphysema: Secondary | ICD-10-CM

## 2023-05-18 DIAGNOSIS — R0602 Shortness of breath: Secondary | ICD-10-CM

## 2023-05-18 MED ORDER — TIRZEPATIDE-WEIGHT MANAGEMENT 2.5 MG/0.5ML ~~LOC~~ SOLN: 2.5000 mg | SUBCUTANEOUS | 0 refills | Status: DC

## 2023-05-18 MED ORDER — ALBUTEROL SULFATE HFA 108 (90 BASE) MCG/ACT IN AERS
2.0000 | INHALATION_SPRAY | Freq: Four times a day (QID) | RESPIRATORY_TRACT | 6 refills | Status: AC | PRN
Start: 2023-05-18 — End: ?

## 2023-05-18 NOTE — Patient Instructions (Addendum)
 Weight loss efforts as tolerated  Will put in a prescription for the weight loss medications Tirzepatide  Prescription for today for for tirzep 8 atide 2.5 mg subcu weekly for 4 weeks and then will gradually increase dose as tolerated  Prescription for rescue inhaler to be used as needed  Continue using your CPAP with oxygen piped into the system   Follow-up in about 3 months

## 2023-05-18 NOTE — Progress Notes (Signed)
 [name]    [id number]    August 12, 1945  Primary Care Physician:Williams, Dorene Ar, MD  Referring Physician: Miguel Aschoff, MD [address] [place],  [place] [address]  Chief complaint:   Patient being evaluated for shortness of breath  HPI:  Patient with pulmonary hypertension, obesity hypoventilation, Obstructive sleep apnea on CPAP therapy with oxygen supplementation at night  Working on weight loss  Records with recent cardiology visit reviewed -She will benefit from right heart catheterization  Continues to feel short of breath on exertion Has not been exercising  She attributes significant weight gain with antidepressants, this has recently been changed and she has managed to lose about 10 pounds since the change in her medications History of emphysema,, class III severe obesity, major depression Diagnosed with obstructive sleep apnea, on CPAP therapy -Compliant with CPAP use  She is on chronic anticoagulation and Spiriva Respimat -Not noticing any significant change with her breathing with Spiriva use  Previous smoker quit over 38 years ago, was smoking a pack a day  Outpatient Encounter Medications as of 05/18/2023  Medication Sig   buPROPion (WELLBUTRIN SR) 200 MG 12 hr tablet Take 200 mg by mouth daily.   chlordiazePOXIDE (LIBRIUM) 5 MG capsule Take 5-15 mg by mouth at bedtime.   desvenlafaxine (PRISTIQ) 100 MG 24 hr tablet Take 100 mg by mouth every morning.   furosemide (LASIX) 40 MG tablet Take 1 tablet (40 mg total) by mouth daily.   Multiple Vitamin (MULTIVITAMIN) tablet Take 1 tablet by mouth daily. (Patient taking differently: Take 1 tablet by mouth every evening.)   Omega-3 Fatty Acids (OMEGA-3 FISH OIL PO) Take 1 capsule by mouth every evening.    pravastatin (PRAVACHOL) 40 MG tablet Take 1 tablet (40 mg total) by mouth daily.   rivaroxaban (XARELTO) 20 MG TABS tablet TAKE 1 TABLET EVERY DAY WITH SUPPER   solifenacin  (VESICARE) 5 MG tablet Take 5 mg by mouth daily.   traZODone (DESYREL) 50 MG tablet Take 50 mg by mouth at bedtime as needed for sleep.   [DISCONTINUED] amLODipine (NORVASC) 5 MG tablet Take 1 tablet (5 mg total) by mouth daily.   No facility-administered encounter medications on file as of 05/18/2023.    Allergies as of 05/18/2023 - Review Complete 05/18/2023  Allergen Reaction Noted   Lisinopril Cough 09/24/2006    Past Medical History:  Diagnosis Date   Arthritis    osteoarthritis , kness.   Chronic hypoxemic respiratory failure (HCC) 11/18/2013   8/26 CTA >>> extensive b/l PE's with evidence of right heart strain c/w at least submassive PE.  8/26 Echo >>> EF 55%, grade 1 diastolic dysfx, mod/severe RV dilation with mod RV systolic dysfx, PAS 51 mmHg  8/27 LE Dopplers >>>negative 01/2014 Echo> EF 60%, RV "upper limits of normal", cannot estimate RVSP    Chronic kidney disease    Renal insuffucinecy, cr-1.33 in 10/2005.   Depression    followed by Dr. Evelene Croon   Diverticulosis of colon    Embolism and thrombosis of unspecified artery (HCC) 01/11/2020   Fibrocystic breast disease    History of hepatitis B    History of pulmonary embolism 01/2002   Hyperlipidemia    Hypertension    Morbid obesity with body mass index (BMI) of 50.0 to 59.9 in adult [facility]) 01/19/2006        Neck nodule    not erythematous, movable like cyst located at the base of posterior  neck on the left side(not painful), will need follow up for    Obesity    Obstructive sleep apnea    cpap    Pulmonary nodule    Stable on repeat imaging. LLL nodule 5 mm   Vasovagal near-syncope 06/28/2020   Acute episode around 10:30 am when preparing to leave today's visit.  Had been sitting in chair for 2 hours; minimal sleep night prior; last meal was popcorn and sips of water around 1 am.  "I feel dizzy" she noted while sitting - recheck BP not initially measurable, pulses faint, became cool and  diaphoretic and minimally  verbally responsive though did not completely lose consciousness.  Denied d    Past Surgical History:  Procedure Laterality Date   CHOLECYSTECTOMY N/A 08/09/2015   Procedure: LAPAROSCOPIC CHOLECYSTECTOMY WITH INTRAOPERATIVE CHOLANGIOGRAM;  Surgeon: Harriette Bouillon, MD;  Location: [facility] OR;  Service: General;  Laterality: N/A;   FLEXIBLE SIGMOIDOSCOPY N/A 04/29/2018   Procedure: Arnell Sieving;  Surgeon: Carman Ching, MD;  Location: [facility] ENDOSCOPY;  Service: Endoscopy;  Laterality: N/A;   GYN surgery     s/p excison of vulvar cyst 10/18/04 by dr. [name]    Family History  Problem Relation Age of Onset   Hypertension Other        family history   Kidney disease Maternal Uncle    Parkinson's disease Brother    Hypertension Mother     Social History   Socioeconomic History   Marital status: Widowed    Spouse name: Not on file   Number of children: Not on file   Years of education: Not on file   Highest education level: Not on file  Occupational History   Occupation: Retired   Occupation: Disabled  Tobacco Use   Smoking status: Former    Current packs/day: 0.00    Average packs/day: 1 pack/day for 20.0 years (20.0 ttl pk-yrs)    Types: Cigarettes    Start date: 03/08/1964    Quit date: 03/08/1984    Years since quitting: 39.2    Passive exposure: Past   Smokeless tobacco: Never  Vaping Use   Vaping status: Never Used  Substance and Sexual Activity   Alcohol use: No    Alcohol/week: 0.0 standard drinks of alcohol   Drug use: No   Sexual activity: Not Currently  Other Topics Concern   Not on file  Social History Narrative   Grew up in government housing. Received college education, had career Education officer, community, Veterinary surgeon, others), and own home before depression dx. Now back in government housing.    Started smoking in her teens and quit in her 27s.  She never smoked more than 1 pack/day.  Likely, less than a 20-pack-year history      Current Social History 07/19/2020       Patient lives alone in a ground floor apartment which is 1 story. There are not steps up to the entrance the patient uses.       Patient's method of transportation is personal car.      The highest level of education was Bachelor's Degree.      The patient currently retired      Identified important Relationships are "I talk with my sister in [place] every night."       Pets : "I had to put down my 51.5 yo dog in July 2020." Made a goal to look into fostering a pet.       Interests / Fun: TV, play games  and read articles on internet."        Current Stressors: "I don't have any right now."       Religious / Personal Beliefs: "Christian"       L. Ducatte, BSN, RN-BC        Social Drivers of Health   Financial Resource Strain: Low Risk  (09/10/2022)   Overall Financial Resource Strain (CARDIA)    Difficulty of Paying Living Expenses: Not very hard  Food Insecurity: No Food Insecurity (09/10/2022)   Hunger Vital Sign    Worried About Running Out of Food in the Last Year: Never true    Ran Out of Food in the Last Year: Never true  Transportation Needs: No Transportation Needs (09/10/2022)   PRAPARE - Administrator, Civil Service (Medical): No    Lack of Transportation (Non-Medical): No  Physical Activity: Inactive (09/10/2022)   Exercise Vital Sign    Days of Exercise per Week: 0 days    Minutes of Exercise per Session: 0 min  Stress: No Stress Concern Present (09/10/2022)   [employer] of Occupational Health - Occupational Stress Questionnaire    Feeling of Stress : Not at all  Social Connections: Socially Isolated (09/10/2022)   Social Connection and Isolation Panel [NHANES]    Frequency of Communication with Friends and Family: More than three times a week    Frequency of Social Gatherings with Friends and Family: Never    Attends Religious Services: Never    Database administrator or Organizations: No    Attends Banker Meetings: Never     Marital Status: Widowed  Intimate Partner Violence: Not At Risk (09/10/2022)   Humiliation, Afraid, Rape, and Kick questionnaire    Fear of Current or Ex-Partner: No    Emotionally Abused: No    Physically Abused: No    Sexually Abused: No    Review of Systems  Respiratory:  Positive for apnea and shortness of breath.   Psychiatric/Behavioral:  Positive for sleep disturbance.     Vitals:   05/18/23 1059 05/18/23 1102  BP: 134/78 134/78  Pulse: 71 71  SpO2: 97% 97%     Physical Exam Constitutional:      Appearance: She is obese.  HENT:     Head: Normocephalic.     Mouth/Throat:     Mouth: Mucous membranes are moist.  Eyes:     General: No scleral icterus. Cardiovascular:     Rate and Rhythm: Normal rate and regular rhythm.     Heart sounds: No murmur heard.    No friction rub.  Pulmonary:     Effort: No respiratory distress.     Breath sounds: No stridor. No wheezing or rhonchi.  Musculoskeletal:     Cervical back: No rigidity or tenderness.  Neurological:     Mental Status: She is alert.  Psychiatric:        Mood and Affect: Mood normal.     Data Reviewed: CPAP compliance reviewed showing excellent compliance at 97% AutoSet 5-15 Average use of 8 hours 56 minutes Residual AHI of 2.7  Recent CT scan of the chest concerning for pulmonary hypertension, basal interstitial changes, emphysema, bronchiectasis at the bases  Previous pulmonary function test December 2020-spirometry within normal limits but will reduced diffusing capacity  PFT 01/18/2023 shows no obstruction, no significant restriction, severe reduction in diffusing capacity  Previous echocardiogram reviewed-suboptimal but no mention of pulmonary hypertension -Flattening of the interventricular septum consistent with right ventricular pressure  and volume overload  Assessment:   Multifactorial chronic hypoxic respiratory failure -Obstructive sleep apnea, diastolic dysfunction, class III obesity,  emphysema on CT however PFT not showing significant obstructive defect  Obstructive sleep apnea -Compliant with CPAP therapy  Chronic hypoxemic respiratory failure -On oxygen supplementation  History of antiphospholipid syndrome -On anticoagulation chronically  Severe major depression -On medications  Chronic kidney disease, history of prediabetes Diastolic dysfunction on echocardiogram  Most recent echo still suboptimal for assessment of pulmonary hypertension -I think patient will benefit from a right heart catheterization -Echocardiogram suggestive of pressure overload, enlarged pulmonary artery on recent CT, reduced diffusing capacity on pulmonary function tests  Plan/Recommendations:  Rescue inhaler as needed  Continue oxygen supplementation  Continue CPAP on a nightly basis  Weight loss efforts  Will place a prescription for Tirzepatide to start at 2.5 mg q. weekly and gradually increase for weekly  Graded activities as tolerated  She did stop Spiriva since her last visit -Will order rescue inhaler as needed  Follow-up in about 3 months  Patient wants to lose weight, is trying, not able to exercise much-trend of the weight is downwards She believes her primary doctor tried to get on weight loss medications in the past and was not approved -I think this will be of benefit to her  Encouraged to give [place] a call with any significant concerns  Virl Diamond MD [facility] 05/18/2023, 11:14 AM  CC: Miguel Aschoff, MD

## 2023-05-19 ENCOUNTER — Ambulatory Visit (HOSPITAL_COMMUNITY): Payer: Medicare HMO

## 2023-05-21 ENCOUNTER — Ambulatory Visit (HOSPITAL_COMMUNITY): Payer: Medicare HMO

## 2023-05-26 ENCOUNTER — Ambulatory Visit (HOSPITAL_COMMUNITY): Payer: Medicare HMO

## 2023-05-28 ENCOUNTER — Ambulatory Visit (HOSPITAL_COMMUNITY): Payer: Medicare HMO

## 2023-06-02 ENCOUNTER — Ambulatory Visit (HOSPITAL_COMMUNITY): Payer: Medicare HMO

## 2023-06-04 ENCOUNTER — Ambulatory Visit (HOSPITAL_COMMUNITY): Payer: Medicare HMO

## 2023-06-04 ENCOUNTER — Other Ambulatory Visit: Payer: Self-pay | Admitting: Pulmonary Disease

## 2023-06-05 MED ORDER — TIRZEPATIDE-WEIGHT MANAGEMENT 2.5 MG/0.5ML ~~LOC~~ SOLN: 2.5000 mg | SUBCUTANEOUS | 0 refills | Status: DC

## 2023-06-09 ENCOUNTER — Ambulatory Visit (HOSPITAL_COMMUNITY): Payer: Medicare HMO

## 2023-06-10 ENCOUNTER — Encounter (HOSPITAL_COMMUNITY): Payer: Self-pay | Admitting: Cardiology

## 2023-06-10 ENCOUNTER — Other Ambulatory Visit (HOSPITAL_COMMUNITY): Payer: Self-pay

## 2023-06-10 ENCOUNTER — Ambulatory Visit (HOSPITAL_COMMUNITY)
Admission: RE | Admit: 2023-06-10 | Discharge: 2023-06-10 | Disposition: A | Discharge status: Home / Self Care | Source: Ambulatory Visit | Attending: Cardiology | Admitting: Cardiology

## 2023-06-10 VITALS — BP 100/60 | HR 79 | Wt 325.6 lb

## 2023-06-10 DIAGNOSIS — G4733 Obstructive sleep apnea (adult) (pediatric): Secondary | ICD-10-CM | POA: Insufficient documentation

## 2023-06-10 DIAGNOSIS — I272 Pulmonary hypertension, unspecified: Secondary | ICD-10-CM

## 2023-06-10 DIAGNOSIS — E669 Obesity, unspecified: Secondary | ICD-10-CM | POA: Insufficient documentation

## 2023-06-10 DIAGNOSIS — I5032 Chronic diastolic (congestive) heart failure: Secondary | ICD-10-CM | POA: Insufficient documentation

## 2023-06-10 DIAGNOSIS — Z6841 Body Mass Index (BMI) 40.0 and over, adult: Secondary | ICD-10-CM

## 2023-06-10 DIAGNOSIS — R4189 Other symptoms and signs involving cognitive functions and awareness: Secondary | ICD-10-CM | POA: Diagnosis not present

## 2023-06-10 DIAGNOSIS — Z0000 Encounter for general adult medical examination without abnormal findings: Secondary | ICD-10-CM | POA: Diagnosis present

## 2023-06-10 DIAGNOSIS — E66813 Obesity, class 3: Secondary | ICD-10-CM

## 2023-06-10 DIAGNOSIS — Z7901 Long term (current) use of anticoagulants: Secondary | ICD-10-CM | POA: Diagnosis not present

## 2023-06-10 DIAGNOSIS — Z86711 Personal history of pulmonary embolism: Secondary | ICD-10-CM | POA: Diagnosis present

## 2023-06-10 DIAGNOSIS — F322 Major depressive disorder, single episode, severe without psychotic features: Secondary | ICD-10-CM | POA: Diagnosis not present

## 2023-06-10 LAB — CBC
HCT: 42.4 % (ref 36.0–46.0)
Hemoglobin: 13.9 g/dL (ref 12.0–15.0)
MCH: 27.2 pg (ref 26.0–34.0)
MCHC: 32.8 g/dL (ref 30.0–36.0)
MCV: 83 fL (ref 80.0–100.0)
Platelets: 209 10*3/uL (ref 150–400)
RBC: 5.11 MIL/uL (ref 3.87–5.11)
RDW: 13.8 % (ref 11.5–15.5)
WBC: 5.2 10*3/uL (ref 4.0–10.5)
nRBC: 0 % (ref 0.0–0.2)

## 2023-06-10 LAB — BASIC METABOLIC PANEL WITH GFR
Anion gap: 10 (ref 5–15)
BUN: 8 mg/dL (ref 8–23)
CO2: 26 mmol/L (ref 22–32)
Calcium: 9.1 mg/dL (ref 8.9–10.3)
Chloride: 102 mmol/L (ref 98–111)
Creatinine, Ser: 1.05 mg/dL — ABNORMAL HIGH (ref 0.44–1.00)
GFR, Estimated: 55 mL/min — ABNORMAL LOW (ref >60)
Glucose, Bld: 103 mg/dL — ABNORMAL HIGH (ref 70–99)
Potassium: 4 mmol/L (ref 3.5–5.1)
Sodium: 138 mmol/L (ref 135–145)

## 2023-06-10 LAB — BRAIN NATRIURETIC PEPTIDE: B Natriuretic Peptide: 169 pg/mL — ABNORMAL HIGH (ref 0.0–100.0)

## 2023-06-10 NOTE — Progress Notes (Signed)
 PULMONARY HYPERTENSION NEW PATIENT CLINIC NOTE  Referring Physician: Miguel Aschoff, MD  Primary Care: Miguel Aschoff, MD Primary Cardiologist:  HPI: [name] is a 78 y.o. female with a PMH of obesity, OSA, PE who presents for initial visit for further evaluation and treatment of pulmonary hypertension.     Patient with long history of suspected pulmonary hypertension.  She has a history of recurrent pulmonary embolism including EKOS in 2015 with PA pressures during the procedure of 70/30 (49) with a mean of after infusion. She also has been diagnosed with sleep apnea as well as emphysema. PFTs were consistent with mild restriction as well as a DLCO of 44.   She had a V/Q scan in 2024 given her history of multiple clots and ?APS but it was read as normal.   CT scan in 2024 with mild emphysema, saw pulmonary who overall felt it was unimpressive.      SUBJECTIVE:  Patient reports that she is short of breath with even minimal exertion.  She does not move much during the day and when she does her heart rate increases and she feels poorly.  She reports the symptoms have been ongoing for many years but have significantly worsened recently.  She has been faithfully taking her Xarelto without issue.  Patient does appear to have some memory issues and had to repeat myself multiple times during the course of the conversation.  PMH, current medications, allergies, social history, and family history reviewed in epic.  PHYSICAL EXAM: Vitals:   06/10/23 1514  BP: 100/60  Pulse: 79  SpO2: 100%   GENERAL: Chronically ill-appearing PULM: Mildly increased work of breathing, clear to auscultation CARDIAC:  JVP: Mildly elevated         Normal rate with regular rhythm. No murmurs, rubs or gallops.  1+ lower extremity edema. Warm and well perfused extremities. ABDOMEN: Soft, non-tender, non-distended. NEUROLOGIC: Patient is oriented x3 with no focal or  lateralizing neurologic deficits.    DATA REVIEW  ECG: Normal sinus rhythm, normal QRS, potential RVH  ECHO: 03/01/1933: LVEF 50 to 55%, grade 1 diastolic dysfunction, IVS flattening systole and diastole, poor visualization of the RV  CATH: None  PH Workup V/Q scan in 2024 normal CT chest in 2024 with potential early interstitial disease PFTs with severely reduced DLCO, mild restrictive disease, no obstructive disease   Etiology of PH: Workup ongoing WHO Functional class: IIIb : After RHC Volume status: Hypervolemic Pulmonary vasodilators: Awaiting therapy Advanced therapies: Not a candidate  Work up: Labs & Orders: ANA by IFA, BMP, LFTs, TSH, NT-proBNP, ECG, ABG, APS (CTEPH) Imaging: V/Q Scan, CXR, TTE, Liver U/S, +/- CMR Functional: , CPET, PFTs/spirometry Hemodynamics: RHC     ASSESSMENT & PLAN:  Pulmonary hypertension: Suspicion based on prior echocardiograms, history of pulmonary embolism with elevated PA pressures out of proportion to acute disease, and multiple risk factors.  Patient with known sleep apnea that is well treated, PFTs reviewed and besides severely reduced DLCO largely unremarkable.  V/q scan negative.  Discussed need for right heart catheterization with patient. - Autoimmune workup following right heart cath - Right heart catheterization ordered - Continue Xarelto 20 mg daily - Continue furosemide 40 mg daily - Not an SGLT2 candidate with immobility and UTI risk  History of pulmonary embolism: Due to history of antiphospholipid syndrome, but labs were only mildly positive and not diagnostic in the setting of anticoagulation.  Question the history given that she has done well on  Xarelto as opposed to preferred warfarin. - Lifelong anticoagulation - Recent V/Q scan reviewed  Obesity: Likely contributing to restrictive disease and has significant deconditioning.  - GLP-1 insurance authorization ongoing, would benefit given history of OSA  and severe obesity  MDD: On therapy, noted to have some cognitive slowing. - Follow up with PCP  OSA: Continue CPAP therapy.   I have reviewed the risks, indications, and alternatives to cardiac catheterization +/- angioplasty or stenting with the patient. Risks include but are not limited to bleeding, infection, vascular injury, stroke, myocardial infection, arrhythmia, kidney injury, radiation-related injury in the case of prolonged fluoroscopy use, emergency cardiac surgery, and death. The patient understands the risks of serious complication is low (<1%) and he agrees to proceed.   Follow up in 2 months    Clearnce Hasten, MD Advanced Heart Failure Mechanical Circulatory Support 06/11/23

## 2023-06-10 NOTE — H&P (View-Only) (Signed)
 PULMONARY HYPERTENSION NEW PATIENT CLINIC NOTE  Referring Physician: Miguel Aschoff, MD  Primary Care: Miguel Aschoff, MD Primary Cardiologist:  HPI: [name] is a 78 y.o. female with a PMH of obesity, OSA, PE who presents for initial visit for further evaluation and treatment of pulmonary hypertension.     Patient with long history of suspected pulmonary hypertension.  She has a history of recurrent pulmonary embolism including EKOS in 2015 with PA pressures during the procedure of 70/30 (49) with a mean of after infusion. She also has been diagnosed with sleep apnea as well as emphysema. PFTs were consistent with mild restriction as well as a DLCO of 44.   She had a V/Q scan in 2024 given her history of multiple clots and ?APS but it was read as normal.   CT scan in 2024 with mild emphysema, saw pulmonary who overall felt it was unimpressive.      SUBJECTIVE:  Patient reports that she is short of breath with even minimal exertion.  She does not move much during the day and when she does her heart rate increases and she feels poorly.  She reports the symptoms have been ongoing for many years but have significantly worsened recently.  She has been faithfully taking her Xarelto without issue.  Patient does appear to have some memory issues and had to repeat myself multiple times during the course of the conversation.  PMH, current medications, allergies, social history, and family history reviewed in epic.  PHYSICAL EXAM: Vitals:   06/10/23 1514  BP: 100/60  Pulse: 79  SpO2: 100%   GENERAL: Chronically ill-appearing PULM: Mildly increased work of breathing, clear to auscultation CARDIAC:  JVP: Mildly elevated         Normal rate with regular rhythm. No murmurs, rubs or gallops.  1+ lower extremity edema. Warm and well perfused extremities. ABDOMEN: Soft, non-tender, non-distended. NEUROLOGIC: Patient is oriented x3 with no focal or  lateralizing neurologic deficits.    DATA REVIEW  ECG: Normal sinus rhythm, normal QRS, potential RVH  ECHO: 03/01/1931: LVEF 50 to 55%, grade 1 diastolic dysfunction, IVS flattening systole and diastole, poor visualization of the RV  CATH: None  PH Workup V/Q scan in 2024 normal CT chest in 2024 with potential early interstitial disease PFTs with severely reduced DLCO, mild restrictive disease, no obstructive disease   Etiology of PH: Workup ongoing WHO Functional class: IIIb : After RHC Volume status: Hypervolemic Pulmonary vasodilators: Awaiting therapy Advanced therapies: Not a candidate  Work up: Labs & Orders: ANA by IFA, BMP, LFTs, TSH, NT-proBNP, ECG, ABG, APS (CTEPH) Imaging: V/Q Scan, CXR, TTE, Liver U/S, +/- CMR Functional: , CPET, PFTs/spirometry Hemodynamics: RHC     ASSESSMENT & PLAN:  Pulmonary hypertension: Suspicion based on prior echocardiograms, history of pulmonary embolism with elevated PA pressures out of proportion to acute disease, and multiple risk factors.  Patient with known sleep apnea that is well treated, PFTs reviewed and besides severely reduced DLCO largely unremarkable.  V/q scan negative.  Discussed need for right heart catheterization with patient. - Autoimmune workup following right heart cath - Right heart catheterization ordered - Continue Xarelto 20 mg daily - Continue furosemide 40 mg daily - Not an SGLT2 candidate with immobility and UTI risk  History of pulmonary embolism: Due to history of antiphospholipid syndrome, but labs were only mildly positive and not diagnostic in the setting of anticoagulation.  Question the history given that she has done well on  Xarelto as opposed to preferred warfarin. - Lifelong anticoagulation - Recent V/Q scan reviewed  Obesity: Likely contributing to restrictive disease and has significant deconditioning.  - GLP-1 insurance authorization ongoing, would benefit given history of OSA  and severe obesity  MDD: On therapy, noted to have some cognitive slowing. - Follow up with PCP  OSA: Continue CPAP therapy.   I have reviewed the risks, indications, and alternatives to cardiac catheterization +/- angioplasty or stenting with the patient. Risks include but are not limited to bleeding, infection, vascular injury, stroke, myocardial infection, arrhythmia, kidney injury, radiation-related injury in the case of prolonged fluoroscopy use, emergency cardiac surgery, and death. The patient understands the risks of serious complication is low (<1%) and he agrees to proceed.   Follow up in 2 months    Clearnce Hasten, MD Advanced Heart Failure Mechanical Circulatory Support 06/11/23

## 2023-06-10 NOTE — Patient Instructions (Signed)
  Labs done today, your results will be available in MyChart, we will contact you for abnormal readings.  You are scheduled for a Cardiac Catheterization on Wednesday, April 30 with Dr.  Elwyn Lade .  1. Please arrive at the [facility] (Main Entrance A) at [facility]: [address] [place], [place] [address] at 7:00 AM (This time is 2 hour(s) before your procedure to ensure your preparation).   Free valet parking service is available. You will check in at ADMITTING. The support person will be asked to wait in the waiting room.  It is OK to have someone drop you off and come back when you are ready to be discharged.    Special note: Every effort is made to have your procedure done on time. Please understand that emergencies sometimes delay scheduled procedures.  2. Diet: Do not eat solid foods after midnight.  The patient may have clear liquids until 5am upon the day of the procedure.  3.  Medication instructions in preparation for your procedure:   Contrast Allergy: No  HOLD YOUR XARELTO THE DAY BEFORE AND DAY OF YOUR PROCEDURE.  HOLD YOUR LASIX THE MORNING OF YOUR PROCEDURE.  On the morning of your procedure, take any morning medicines NOT listed above.  You may use sips of water.  5. Plan to go home the same day, you will only stay overnight if medically necessary. 6. Bring a current list of your medications and current insurance cards. 7. You MUST have a responsible person to drive you home. 8. Someone MUST be with you the first 24 hours after you arrive home or your discharge will be delayed. 9. Please wear clothes that are easy to get on and off and wear slip-on shoes.   Your physician recommends that you schedule a follow-up appointment in: 2 months.  If you have any questions or concerns before your next appointment please send [place] a message through [place] or call our office at [phone].    TO LEAVE A MESSAGE FOR THE NURSE SELECT OPTION 2, PLEASE LEAVE A MESSAGE  INCLUDING: YOUR NAME DATE OF BIRTH CALL BACK NUMBER REASON FOR CALL**this is important as we prioritize the call backs  YOU WILL RECEIVE A CALL BACK THE SAME DAY AS LONG AS YOU CALL BEFORE 4:00 PM  At the [facility] Failure Clinic, you and your health needs are our priority. As part of our continuing mission to provide you with exceptional heart care, we have created designated Provider Care Teams. These Care Teams include your primary Cardiologist (physician) and Advanced Practice Providers (APPs- Physician Assistants and Nurse Practitioners) who all work together to provide you with the care you need, when you need it.   You may see any of the following providers on your designated Care Team at your next follow up: Dr Arvilla Meres Dr Marca Ancona Dr. Dorthula Nettles Dr. Clearnce Hasten Amy Filbert Schilder, NP Robbie Lis, [place] [facility] [place], [place] Brynda Peon, NP [place] Lee, NP Clarisa Kindred, NP Karle Plumber, PharmD Enos Fling, PharmD   Please be sure to bring in all your medications bottles to every appointment.    Thank you for choosing [facility]

## 2023-06-11 ENCOUNTER — Ambulatory Visit (HOSPITAL_COMMUNITY): Payer: Medicare HMO

## 2023-06-12 ENCOUNTER — Telehealth: Payer: Self-pay

## 2023-06-16 ENCOUNTER — Ambulatory Visit (HOSPITAL_COMMUNITY): Payer: Medicare HMO

## 2023-06-18 ENCOUNTER — Ambulatory Visit: Payer: Self-pay | Admitting: Internal Medicine

## 2023-06-18 ENCOUNTER — Ambulatory Visit (HOSPITAL_COMMUNITY): Payer: Medicare HMO

## 2023-06-18 NOTE — Telephone Encounter (Signed)
 Copied from CRM [id number]. Topic: Clinical - Prescription Issue >> Jun 18, 2023  2:16 PM [name] wrote: Reason for CRM: patient calling back in regards to zepbound medication. Patient is requesting for wegovy prescription to be sent because her [name] will cover it. Please call patient.

## 2023-06-21 ENCOUNTER — Other Ambulatory Visit: Payer: Self-pay | Admitting: Internal Medicine

## 2023-06-21 DIAGNOSIS — G4733 Obstructive sleep apnea (adult) (pediatric): Secondary | ICD-10-CM

## 2023-06-21 DIAGNOSIS — R7303 Prediabetes: Secondary | ICD-10-CM

## 2023-06-21 DIAGNOSIS — E66813 Obesity, class 3: Secondary | ICD-10-CM

## 2023-06-21 MED ORDER — WEGOVY 0.25 MG/0.5ML ~~LOC~~ SOAJ: 0.2500 mg | SUBCUTANEOUS | 2 refills | Status: DC

## 2023-06-22 NOTE — Telephone Encounter (Signed)
 Pt was called and informed Wegovy  rx was sent to [facility] pharmacy.

## 2023-06-23 ENCOUNTER — Ambulatory Visit (HOSPITAL_COMMUNITY): Payer: Medicare HMO

## 2023-06-23 ENCOUNTER — Other Ambulatory Visit: Payer: Self-pay

## 2023-06-23 MED ORDER — PRAVASTATIN SODIUM 40 MG PO TABS: 40.0000 mg | ORAL_TABLET | Freq: Every day | ORAL | 3 refills | Status: AC

## 2023-06-23 NOTE — Telephone Encounter (Signed)
 Medication sent to pharmacy

## 2023-06-25 ENCOUNTER — Ambulatory Visit (HOSPITAL_COMMUNITY): Payer: Medicare HMO

## 2023-06-26 ENCOUNTER — Telehealth: Payer: Self-pay

## 2023-06-26 ENCOUNTER — Other Ambulatory Visit (HOSPITAL_COMMUNITY): Payer: Self-pay

## 2023-06-26 NOTE — Telephone Encounter (Signed)
 Prior Authorization for patient (Wegovy  0.25MG /0.5ML auto-injectors) came through on cover my meds was submitted with last office notes awaiting approval or denial.  QIO:[id number]

## 2023-06-26 NOTE — Telephone Encounter (Signed)
 The drug above is APPROVED through 03/02/2024 for up to a 30-day supply by  [employer]'s Non-Formulary Exceptions Coverage Policy. You asked for the drug above  for an 84-day supply. Page 5 of your Prescription Drug Guide (PDG) says that some  drugs are limited to a 30-day supply. Due to this drug's cost and/or tier, your plan only  allows up to a 30-day supply of this drug per fill. The 30-day supply limit on certain  drugs can also apply to mail-order. You can view your PDG and see a full list of innetwork pharmacies by visiting us  online or call the number on the back of your card.   Per Aron Lard the patient will have a $12 copay

## 2023-06-29 ENCOUNTER — Encounter: Payer: Self-pay | Admitting: Pulmonary Disease

## 2023-06-30 ENCOUNTER — Ambulatory Visit (HOSPITAL_COMMUNITY): Payer: Medicare HMO

## 2023-07-01 ENCOUNTER — Ambulatory Visit (HOSPITAL_COMMUNITY)
Admission: RE | Admit: 2023-07-01 | Discharge: 2023-07-01 | Disposition: A | Discharge status: Home / Self Care | Attending: Cardiology | Admitting: Cardiology

## 2023-07-01 ENCOUNTER — Other Ambulatory Visit: Payer: Self-pay

## 2023-07-01 ENCOUNTER — Encounter (HOSPITAL_COMMUNITY)
Admission: RE | Disposition: A | Discharge status: Home / Self Care | Payer: Self-pay | Source: Home / Self Care | Attending: Cardiology

## 2023-07-01 DIAGNOSIS — Z6841 Body Mass Index (BMI) 40.0 and over, adult: Secondary | ICD-10-CM | POA: Insufficient documentation

## 2023-07-01 DIAGNOSIS — Z86711 Personal history of pulmonary embolism: Secondary | ICD-10-CM | POA: Insufficient documentation

## 2023-07-01 DIAGNOSIS — Z79899 Other long term (current) drug therapy: Secondary | ICD-10-CM | POA: Diagnosis not present

## 2023-07-01 DIAGNOSIS — Z7901 Long term (current) use of anticoagulants: Secondary | ICD-10-CM | POA: Insufficient documentation

## 2023-07-01 DIAGNOSIS — G4733 Obstructive sleep apnea (adult) (pediatric): Secondary | ICD-10-CM | POA: Insufficient documentation

## 2023-07-01 DIAGNOSIS — J439 Emphysema, unspecified: Secondary | ICD-10-CM | POA: Insufficient documentation

## 2023-07-01 DIAGNOSIS — F329 Major depressive disorder, single episode, unspecified: Secondary | ICD-10-CM | POA: Diagnosis not present

## 2023-07-01 DIAGNOSIS — I272 Pulmonary hypertension, unspecified: Secondary | ICD-10-CM | POA: Insufficient documentation

## 2023-07-01 HISTORY — PX: RIGHT HEART CATH: CATH118263

## 2023-07-01 LAB — POCT I-STAT EG7
Acid-Base Excess: 2 mmol/L (ref 0.0–2.0)
Acid-Base Excess: 1 mmol/L (ref 0.0–2.0)
Bicarbonate: 27.1 mmol/L (ref 20.0–28.0)
Bicarbonate: 27 mmol/L (ref 20.0–28.0)
Calcium, Ion: 1.18 mmol/L (ref 1.15–1.40)
Calcium, Ion: 1.2 mmol/L (ref 1.15–1.40)
HCT: 38 % (ref 36.0–46.0)
HCT: 39 % (ref 36.0–46.0)
Hemoglobin: 12.9 g/dL (ref 12.0–15.0)
Hemoglobin: 13.3 g/dL (ref 12.0–15.0)
O2 Saturation: 66 %
O2 Saturation: 64 %
Potassium: 3.3 mmol/L — ABNORMAL LOW (ref 3.5–5.1)
Potassium: 3.3 mmol/L — ABNORMAL LOW (ref 3.5–5.1)
Sodium: 141 mmol/L (ref 135–145)
Sodium: 141 mmol/L (ref 135–145)
TCO2: 28 mmol/L (ref 22–32)
TCO2: 28 mmol/L (ref 22–32)
pCO2, Ven: 44.5 mmHg (ref 44–60)
pCO2, Ven: 44.8 mmHg (ref 44–60)
pH, Ven: 7.392 (ref 7.25–7.43)
pH, Ven: 7.387 (ref 7.25–7.43)
pO2, Ven: 35 mmHg (ref 32–45)
pO2, Ven: 34 mmHg (ref 32–45)

## 2023-07-01 SURGERY — RIGHT HEART CATH
Anesthesia: LOCAL

## 2023-07-01 MED ORDER — LIDOCAINE HCL (PF) 1 % IJ SOLN
INTRAMUSCULAR | Status: AC
  Filled 2023-07-01: qty 30

## 2023-07-01 MED ORDER — HEPARIN (PORCINE) IN NACL 1000-0.9 UT/500ML-% IV SOLN
INTRAVENOUS | Status: DC | PRN
  Administered 2023-07-01: 500 mL

## 2023-07-01 MED ORDER — LIDOCAINE HCL (PF) 1 % IJ SOLN
INTRAMUSCULAR | Status: DC | PRN
  Administered 2023-07-01: 2 mL

## 2023-07-01 MED ORDER — SODIUM CHLORIDE 0.9 % IV SOLN: INTRAVENOUS | Status: DC

## 2023-07-01 SURGICAL SUPPLY — 5 items
CATH SWAN GANZ 7F STRAIGHT (CATHETERS) IMPLANT
GLIDESHEATH SLENDER 7FR .021G (SHEATH) IMPLANT
PACK CARDIAC CATHETERIZATION (CUSTOM PROCEDURE TRAY) ×2 IMPLANT
TRANSDUCER W/STOPCOCK (MISCELLANEOUS) IMPLANT
TUBING ART PRESS 72 MALE/FEM (TUBING) IMPLANT

## 2023-07-01 NOTE — Discharge Instructions (Addendum)

## 2023-07-01 NOTE — Interval H&P Note (Signed)
 History and Physical Interval Note:  07/01/2023 9:23 AM  [name]  has presented today for surgery, with the diagnosis of hp.  The various methods of treatment have been discussed with the patient and family. After consideration of risks, benefits and other options for treatment, the patient has consented to  Procedure(s): RIGHT HEART CATH (N/A) as a surgical intervention.  The patient's history has been reviewed, patient examined, no change in status, stable for surgery.  I have reviewed the patient's chart and labs.  Questions were answered to the patient's satisfaction.     Lauralee Poll

## 2023-07-02 ENCOUNTER — Encounter (HOSPITAL_COMMUNITY): Payer: Self-pay | Admitting: Cardiology

## 2023-07-02 ENCOUNTER — Ambulatory Visit (HOSPITAL_COMMUNITY): Payer: Medicare HMO

## 2023-07-02 NOTE — Telephone Encounter (Signed)
 According to previous notes pt has been approved with a $12 dollar copay

## 2023-07-07 ENCOUNTER — Ambulatory Visit (HOSPITAL_COMMUNITY): Payer: Medicare HMO

## 2023-07-09 ENCOUNTER — Ambulatory Visit (HOSPITAL_COMMUNITY): Payer: Medicare HMO

## 2023-07-14 ENCOUNTER — Ambulatory Visit (HOSPITAL_COMMUNITY): Payer: Medicare HMO

## 2023-07-16 ENCOUNTER — Ambulatory Visit (HOSPITAL_COMMUNITY): Payer: Medicare HMO

## 2023-07-21 ENCOUNTER — Ambulatory Visit (HOSPITAL_COMMUNITY): Payer: Medicare HMO

## 2023-08-04 ENCOUNTER — Ambulatory Visit (HOSPITAL_COMMUNITY)
Admission: RE | Admit: 2023-08-04 | Discharge: 2023-08-04 | Disposition: A | Discharge status: Home / Self Care | Source: Ambulatory Visit | Attending: Cardiology | Admitting: Cardiology

## 2023-08-04 VITALS — BP 144/82 | HR 99 | Wt 319.8 lb

## 2023-08-04 DIAGNOSIS — G4733 Obstructive sleep apnea (adult) (pediatric): Secondary | ICD-10-CM

## 2023-08-04 DIAGNOSIS — I5032 Chronic diastolic (congestive) heart failure: Secondary | ICD-10-CM | POA: Diagnosis not present

## 2023-08-04 DIAGNOSIS — E669 Obesity, unspecified: Secondary | ICD-10-CM | POA: Diagnosis not present

## 2023-08-04 DIAGNOSIS — F32A Depression, unspecified: Secondary | ICD-10-CM | POA: Insufficient documentation

## 2023-08-04 DIAGNOSIS — Z79899 Other long term (current) drug therapy: Secondary | ICD-10-CM | POA: Insufficient documentation

## 2023-08-04 DIAGNOSIS — I272 Pulmonary hypertension, unspecified: Secondary | ICD-10-CM

## 2023-08-04 DIAGNOSIS — Z6841 Body Mass Index (BMI) 40.0 and over, adult: Secondary | ICD-10-CM

## 2023-08-04 DIAGNOSIS — J439 Emphysema, unspecified: Secondary | ICD-10-CM | POA: Insufficient documentation

## 2023-08-04 DIAGNOSIS — Z86711 Personal history of pulmonary embolism: Secondary | ICD-10-CM | POA: Insufficient documentation

## 2023-08-04 DIAGNOSIS — E66813 Obesity, class 3: Secondary | ICD-10-CM

## 2023-08-04 MED ORDER — SPIRONOLACTONE 25 MG PO TABS: 25.0000 mg | ORAL_TABLET | Freq: Every day | ORAL | 3 refills | Status: DC

## 2023-08-04 MED ORDER — SPIRONOLACTONE 25 MG PO TABS: 25.0000 mg | ORAL_TABLET | Freq: Every day | ORAL | 3 refills | Status: AC

## 2023-08-04 NOTE — Patient Instructions (Addendum)
 Good to see you today!  START Spironolactone 25 mg daily  Please follow up with our heart failure pharmacist as scheduled  Your physician recommends that you schedule a follow-up appointment :3  months (September) Call office in July to schedule an appointment  in [place] [phone]  If you have any questions or concerns before your next appointment please send us  a message through [place] or call our office at [phone].    TO LEAVE A MESSAGE FOR THE NURSE SELECT OPTION 2, PLEASE LEAVE A MESSAGE INCLUDING: YOUR NAME DATE OF BIRTH CALL BACK NUMBER REASON FOR CALL**this is important as we prioritize the call backs  YOU WILL RECEIVE A CALL BACK THE SAME DAY AS LONG AS YOU CALL BEFORE 4:00 PM At the [facility], you and your health needs are our priority. As part of our continuing mission to provide you with exceptional heart care, we have created designated Provider Care Teams. These Care Teams include your primary Cardiologist (physician) and Advanced Practice Providers (APPs- Physician Assistants and Nurse Practitioners) who all work together to provide you with the care you need, when you need it.   You may see any of the following providers on your designated Care Team at your next follow up: Dr Jules Oar Dr Peder Bourdon Dr. Alwin Baars Dr. Arta Lark Amy Marijane Shoulders, NP Ruddy Corral, [place] [facility] [place], [place] Dennise Fitz, NP [place] Lee, NP Shawnee Dellen, NP Luster Salters, PharmD Bevely Brush, PharmD   Please be sure to bring in all your medications bottles to every appointment.    Thank you for choosing [facility]

## 2023-08-04 NOTE — Progress Notes (Signed)
 ADVANCED HEART FAILURE FOLLOW UP CLINIC NOTE  Referring Physician: Sherol Dixie, MD  Primary Care: Sherol Dixie, MD Primary Cardiologist:  HPI: [name] is a 78 y.o. female who presents for follow up of pulmonary hypertension.      Patient with long history of suspected pulmonary hypertension.  She has a history of recurrent pulmonary embolism including EKOS in 2015 with PA pressures during the procedure of 70/30 (49) with a mean of after infusion. She also has been diagnosed with sleep apnea as well as emphysema. PFTs were consistent with mild restriction as well as a DLCO of 44.    She had a V/Q scan in 2024 given her history of multiple clots and ?APS but it was read as normal.    CT scan in 2024 with mild emphysema, saw pulmonary who overall felt it was unimpressive.        SUBJECTIVE:  Patient reports that she has lost a significant amount of weight in the past few weeks.  She has been trying to watch her diet better, although her outlet when she is struggling with her mood is food.  She has received Wegovy  but has not started yet due to concern about side effects including nausea and fatigue.  She reports that she is still on oxygen  and is still short of breath with mild to moderate exertion.  Blood pressure elevated in clinic today.  PMH, current medications, allergies, social history, and family history reviewed in epic.  PHYSICAL EXAM: Vitals:   08/04/23 1615  BP: (!) 144/82  Pulse: 99  SpO2: 96%   GENERAL: Chronically ill-appearing, wearing oxygen  PULM: Normal work of breathing CARDIAC:  JVP: Mildly elevated         Normal rate with regular rhythm. No murmurs, rubs or gallops.  Trace lower extremity edema. Warm and well perfused extremities. ABDOMEN: Soft, non-tender, non-distended. NEUROLOGIC: Patient is oriented x3 with no focal or lateralizing neurologic deficits.    DATA REVIEW  ECG: Normal sinus rhythm, normal QRS,  potential RVH   ECHO: 03/01/1931: LVEF 50 to 55%, grade 1 diastolic dysfunction, IVS flattening systole and diastole, poor visualization of the RV   CATH: 06/2023: RA 8, PA 56/22 (40), PCWP 15, TD CO/CI 6.43/2.56, PVR 3.88, PAPi 3.12   PH Workup V/Q scan in 2024 normal CT chest in 2024 with potential early interstitial disease PFTs with severely reduced DLCO, mild restrictive disease, no obstructive disease     Etiology of PH: Workup ongoing, suspect primarily pre-capillary, potentially I and III overlap WHO Functional class: IIIb : Showed up significantly late to her appointment, will do at next visit Volume status: Euvolemic Pulmonary vasodilators: Awaiting therapy Advanced therapies: Not a candidate   Work up: Labs & Orders: ANA by IFA, BMP, LFTs, TSH, NT-proBNP, ECG, ABG, APS (CTEPH) Imaging: V/Q Scan, CXR, TTE, Liver U/S, +/- CMR Functional: , CPET, PFTs/spirometry Hemodynamics: RHC  ASSESSMENT & PLAN:  Pulmonary hypertension: Suspicion based on prior echocardiograms, history of pulmonary embolism with elevated PA pressures out of proportion to acute disease, and multiple risk factors. Moderate PH based on RHC, given relatively normal PFTs and normal V/Q scan would likely benefit from pulmonary vasodilators. Will start on spironolactone initially given her borderline wedge. If she tolerates and has further weight loss with GLP-1 would then start PDE5.   - Autoimmune workup at next visit - Right heart catheterization reviewed - Continue xarelto  20mg  daily - Continue lasxix 40mg  daily, start spironolactone 25mg  daily with  lab follow up with pharmacy in 1 month - Not an SGLT2 candidate with immobility and UTI risk   History of pulmonary embolism: Due to history of antiphospholipid syndrome, but labs were only mildly positive and not diagnostic in the setting of anticoagulation.  Question the history given that she has done well on Xarelto  as opposed to preferred  warfarin. - Lifelong anticoagulation - Recent V/Q scan negative   Obesity: Likely contributing to restrictive disease and has significant deconditioning.  - GLP-1 insurance authorization approved. Will arrange for 1 month visit with pharmacy in [place] as she has some questions about the medication   MDD: On therapy - Follow up with PCP   OSA: Continue CPAP therapy.   Follow up in 3 monthss  Arta Lark, MD Advanced Heart Failure Mechanical Circulatory Support 08/05/23

## 2023-08-26 ENCOUNTER — Other Ambulatory Visit: Payer: Self-pay

## 2023-08-26 ENCOUNTER — Telehealth: Payer: Self-pay | Admitting: Pharmacist

## 2023-08-26 DIAGNOSIS — R0609 Other forms of dyspnea: Secondary | ICD-10-CM

## 2023-08-26 MED ORDER — FUROSEMIDE 40 MG PO TABS: 40.0000 mg | ORAL_TABLET | Freq: Every day | ORAL | 3 refills | Status: DC

## 2023-08-26 NOTE — Telephone Encounter (Signed)
 Called to confirm/remind patient of their appointment at the Advanced [facility] Failure Clinic on 08/27/23.   Appointment:   [] Confirmed  [x] Left mess   [] No answer/No voice mail  [] VM Full/unable to leave message  [] Phone not in service  Patient reminded to bring all medications and/or complete list.  Confirmed patient has transportation. Gave directions, instructed to utilize valet parking.

## 2023-08-27 ENCOUNTER — Ambulatory Visit: Attending: Internal Medicine | Admitting: Pharmacist

## 2023-08-27 ENCOUNTER — Other Ambulatory Visit: Payer: Self-pay | Admitting: *Deleted

## 2023-08-27 ENCOUNTER — Other Ambulatory Visit (HOSPITAL_COMMUNITY): Payer: Self-pay

## 2023-08-27 VITALS — BP 124/80 | HR 88 | Wt 319.0 lb

## 2023-08-27 DIAGNOSIS — E669 Obesity, unspecified: Secondary | ICD-10-CM | POA: Diagnosis not present

## 2023-08-27 DIAGNOSIS — J439 Emphysema, unspecified: Secondary | ICD-10-CM | POA: Diagnosis not present

## 2023-08-27 DIAGNOSIS — F329 Major depressive disorder, single episode, unspecified: Secondary | ICD-10-CM | POA: Insufficient documentation

## 2023-08-27 DIAGNOSIS — R32 Unspecified urinary incontinence: Secondary | ICD-10-CM | POA: Insufficient documentation

## 2023-08-27 DIAGNOSIS — Z86711 Personal history of pulmonary embolism: Secondary | ICD-10-CM | POA: Diagnosis present

## 2023-08-27 DIAGNOSIS — I272 Pulmonary hypertension, unspecified: Secondary | ICD-10-CM

## 2023-08-27 DIAGNOSIS — Z7901 Long term (current) use of anticoagulants: Secondary | ICD-10-CM | POA: Diagnosis not present

## 2023-08-27 DIAGNOSIS — G4733 Obstructive sleep apnea (adult) (pediatric): Secondary | ICD-10-CM | POA: Insufficient documentation

## 2023-08-27 DIAGNOSIS — Z862 Personal history of diseases of the blood and blood-forming organs and certain disorders involving the immune mechanism: Secondary | ICD-10-CM | POA: Insufficient documentation

## 2023-08-27 DIAGNOSIS — Z79899 Other long term (current) drug therapy: Secondary | ICD-10-CM | POA: Insufficient documentation

## 2023-08-27 NOTE — Progress Notes (Signed)
 Advanced Heart Failure Clinic Note  PCP: Trudy Mliss Dragon, MD PCP-Cardiologist: None HF-Cardiologist: Morene Brownie, MD  HPI:  [name] is a 78 y.o. female who presents for follow up of pulmonary hypertension.    Patient with long history of suspected pulmonary hypertension.  She has a history of recurrent pulmonary embolism including EKOS in 2015 with PA pressures during the procedure of 70/30 (49) with a mean of after infusion. She also has been diagnosed with sleep apnea as well as emphysema. PFTs were consistent with mild restriction as well as a DLCO of 44.    She had a V/Q scan in 2024 given her history of multiple clots and ?APS but it was read as normal.    CT scan in 2024 with mild emphysema, saw pulmonary who overall felt it was unimpressive.   Seen by Dr. Brownie on 08/04/2023 where spironolactone  25 mg daily was started. Referred to pharmacy clinic for GLP-1 management and education.   Today Erinn Nyasiah Moffet returns to Heart Failure Clinic for pharmacist medication titration. Reports feeling fatigue. Mobility is limited mostly by depression, but also by weight and respiratory symptoms. Her shortness of breath is at baseline today, but she does report LEE. She has not been taking her furosemide  regularly due to her incontinence.  Reports being able to complete most activities of daily living (ADLs), however, she does have limitations due to mobility. Is not active throughout the day because of her symptoms/depression, but has made lifestyle changes to diet resulting ins modest weight loss. Reports losing weight after stopping Trintellix  too. Appetite ok. Reports that 'feeling full' helps her depression. Somewhat follows a low sodium diet. Eats freezer meals daily.  Current Medications: Loop diuretic: furosemide  40 mg daily (frequent non adherence) Beta-Blocker: none ACEI/ARB/ARNI: none MRA: spironolactone  25 mg daily SGLT2i: none Other:  semaglutide  0.25 mg weekly  Has the patient been experiencing any side effects to the medications prescribed? No  Does the patient have any problems obtaining medications due to transportation or finances? No  Understanding of regimen: Fair  Understanding of indications: Good  Potential of adherence: Good  Patient understands to avoid NSAIDs.  Patient understands to avoid decongestants.  Pertinent Lab Values: Creat  Date Value Ref Range Status  07/27/2014 1.10 0.50 - 1.10 mg/dL Final   Creatinine, Ser  Date Value Ref Range Status  06/10/2023 1.05 (H) 0.44 - 1.00 mg/dL Final   BUN  Date Value Ref Range Status  06/10/2023 8 8 - 23 mg/dL Final  89/70/7975 7 (L) 8 - 27 mg/dL Final   Potassium  Date Value Ref Range Status  07/01/2023 3.3 (L) 3.5 - 5.1 mmol/L Final  07/01/2023 3.3 (L) 3.5 - 5.1 mmol/L Final   Sodium  Date Value Ref Range Status  07/01/2023 141 135 - 145 mmol/L Final  07/01/2023 141 135 - 145 mmol/L Final  12/30/2022 141 134 - 144 mmol/L Final   B Natriuretic Peptide  Date Value Ref Range Status  06/10/2023 169.0 (H) 0.0 - 100.0 pg/mL Final    Comment:    Performed at [facility], [address], [place], [place] [address]   Magnesium  Date Value Ref Range Status  10/14/2018 2.0 1.6 - 2.3 mg/dL Final   TSH  Date Value Ref Range Status  04/19/2020 2.590 0.450 - 4.500 uIU/mL Final    Vital Signs: Today's Vitals   08/27/23 1433  BP: 124/80  Pulse: 88  SpO2: 98%  Weight: (!) 319 lb (144.7  kg)    DATA REVIEW   ECG: Normal sinus rhythm, normal QRS, potential RVH   ECHO: 03/01/1931: LVEF 50 to 55%, grade 1 diastolic dysfunction, IVS flattening systole and diastole, poor visualization of the RV   CATH: 06/2023: RA 8, PA 56/22 (40), PCWP 15, TD CO/CI 6.43/2.56, PVR 3.88, PAPi 3.12   PH Workup V/Q scan in 2024 normal CT chest in 2024 with potential early interstitial disease PFTs with severely reduced DLCO, mild restrictive disease,  no obstructive disease     Etiology of PH: Workup ongoing, suspect primarily pre-capillary, potentially I and III overlap WHO Functional class: IIIb : Showed up significantly late to her appointment, will do at next visit Volume status: Euvolemic Pulmonary vasodilators: Awaiting therapy Advanced therapies: Not a candidate   Work up: Labs & Orders: ANA by IFA, BMP, LFTs, TSH, NT-proBNP, ECG, ABG, APS (CTEPH) Imaging: V/Q Scan, CXR, TTE, Liver U/S, +/- CMR Functional: , CPET, PFTs/spirometry Hemodynamics: RHC   Assessment/Plan: Pulmonary hypertension: Suspicion based on prior echocardiograms, history of pulmonary embolism with elevated PA pressures out of proportion to acute disease, and multiple risk factors. Moderate PH based on RHC, given relatively normal PFTs and normal V/Q scan would likely benefit from pulmonary vasodilators. Started on spironolactone  initially given her borderline wedge. If she tolerates and has further weight loss with GLP-1 would planning to start PDE5 per Dr. Zenaida.   - Autoimmune workup at next visit - Right heart catheterization with PA 56/22 (40 mean), PCWP 15, PVR 3.125 WU. - Continue xarelto  20mg  daily - Continue lasix  40mg  daily - Continue spironolactone  25mg  daily. BMET today. - Not an SGLT2 candidate with immobility and incontinence.   History of pulmonary embolism: Due to history of antiphospholipid syndrome, but labs were only mildly positive and not diagnostic in the setting of anticoagulation.  Question the history given that she has done well on Xarelto  as opposed to preferred warfarin. - Lifelong anticoagulation - Recent V/Q scan negative   Obesity: Likely contributing to restrictive disease and has significant deconditioning.  - Previously hesitant to start Wegovy  due to fear of what she has read online.  - GLP-1RA education provided, including expectations, administration, ADEs, benefits, and cost. Copay is $0. - Patient is agreeable  to starting Wegovy  0.25 mg weekly. Will follow up via phone in 2 weeks to assess tolerability.   MDD: On therapy - Follow up with PCP   OSA: Continue CPAP therapy.  -Start Wegovy   Follow up: 2 weeks with Pharmacy via telephone  Please do not hesitate to reach out with questions or concerns,  Jaun Bash, PharmD, CPP, BCPS Heart Failure Pharmacist  Phone - [phone] 08/27/2023 3:23 PM

## 2023-08-27 NOTE — Patient Instructions (Signed)
 It was a pleasure seeing you today!  MEDICATIONS: -Feel free to reach out if you have any issues in starting your Wegovy  -Call if you have questions about your medications.  LABS: -We will call you if your labs need attention.  NEXT APPOINTMENT: Will call you in 3 weeks to see how you are doing on the medication  In general, to take care of your heart failure: -Limit your fluid intake to 2 Liters (half-gallon) per day.   -Limit your salt intake to ideally 2-3 grams (2000-3000 mg) per day. -Weigh yourself daily and record, and bring that weight diary to your next appointment.  (Weight gain of 2-3 pounds in 1 day typically means fluid weight.) -The medications for your heart are to help your heart and help you live longer.   -Please contact us  before stopping any of your heart medications.  Call the clinic at [phone] with questions or to reschedule future appointments.

## 2023-08-28 ENCOUNTER — Ambulatory Visit: Payer: Self-pay | Admitting: Pharmacist

## 2023-08-28 LAB — BASIC METABOLIC PANEL WITH GFR
BUN/Creatinine Ratio: 14 (ref 12–28)
BUN: 14 mg/dL (ref 8–27)
CO2: 21 mmol/L (ref 20–29)
Calcium: 9.2 mg/dL (ref 8.7–10.3)
Chloride: 101 mmol/L (ref 96–106)
Creatinine, Ser: 1 mg/dL (ref 0.57–1.00)
Glucose: 93 mg/dL (ref 70–99)
Potassium: 3.8 mmol/L (ref 3.5–5.2)
Sodium: 140 mmol/L (ref 134–144)
eGFR: 58 mL/min/{1.73_m2} — ABNORMAL LOW (ref >59)

## 2023-09-15 ENCOUNTER — Telehealth: Payer: Self-pay | Admitting: Pharmacist

## 2023-09-15 NOTE — Telephone Encounter (Signed)
 Called to assess Wegovy  tolerability. Patient is not currently taking the medication because she believes she is losing weight on her own, though she has not weighed recently. The offer was made to give her first injection in clinic if that would help.

## 2023-09-16 ENCOUNTER — Ambulatory Visit: Payer: Medicare HMO

## 2023-09-16 VITALS — Ht 68.0 in | Wt 315.0 lb

## 2023-09-16 DIAGNOSIS — Z0000 Encounter for general adult medical examination without abnormal findings: Secondary | ICD-10-CM | POA: Diagnosis not present

## 2023-09-16 NOTE — Progress Notes (Signed)
 Because this visit was a virtual/telehealth visit,  certain criteria was not obtained, such a blood pressure, CBG if applicable, and timed get up and go. Any medications not marked as taking were not mentioned during the medication reconciliation part of the visit. Any vitals not documented were not able to be obtained due to this being a telehealth visit or patient was unable to self-report a recent blood pressure reading due to a lack of equipment at home via telehealth. Vitals that have been documented are verbally provided by the patient.   Subjective:   [name] is a 78 y.o. who presents for a Medicare Wellness preventive visit.  As a reminder, Annual Wellness Visits don't include a physical exam, and some assessments may be limited, especially if this visit is performed virtually. We may recommend an in-person follow-up visit with your provider if needed.  Visit Complete: Virtual I connected with  [name] on 09/16/23 by a audio enabled telemedicine application and verified that I am speaking with the correct person using two identifiers.  Patient Location: Home  Provider Location: Home Office  I discussed the limitations of evaluation and management by telemedicine. The patient expressed understanding and agreed to proceed.  Vital Signs: Because this visit was a virtual/telehealth visit, some criteria may be missing or patient reported. Any vitals not documented were not able to be obtained and vitals that have been documented are patient reported.  VideoDeclined- This patient declined Librarian, academic. Therefore the visit was completed with audio only.  Persons Participating in Visit: Patient.  AWV Questionnaire: No: Patient Medicare AWV questionnaire was not completed prior to this visit.  Cardiac Risk Factors include: advanced age (>38men, >65 women);dyslipidemia;obesity (BMI >30kg/m2);sedentary  lifestyle;hypertension;family history of premature cardiovascular disease     Objective:    Today's Vitals   09/16/23 1306  Weight: (!) 315 lb (142.9 kg)  Height: 5' 8 (1.727 m)  PainSc: 0-No pain   Body mass index is 47.9 kg/m.     09/16/2023    1:10 PM 07/01/2023    7:55 AM 12/30/2022   10:54 AM 10/07/2022   10:48 AM 09/10/2022    1:18 PM 08/28/2022   10:43 AM 10/10/2021   11:06 AM  Advanced Directives  Does Patient Have a Medical Advance Directive? Yes Yes Yes Yes Yes Yes Yes  Type of Estate agent of [place];Living will Healthcare Power of [place];Living will Healthcare Power of [place];Living will Healthcare Power of [employer] of [place];Living will Healthcare Power of [place];Living will Healthcare Power of [place];Living will  Does patient want to make changes to medical advance directive? No - Patient declined No - Guardian declined No - Patient declined No - Patient declined  No - Patient declined   Copy of Healthcare Power of Attorney in Chart? Yes - validated most recent copy scanned in chart (See row information) No - copy requested Yes - validated most recent copy scanned in chart (See row information) Yes - validated most recent copy scanned in chart (See row information) No - copy requested Yes - validated most recent copy scanned in chart (See row information) Yes - validated most recent copy scanned in chart (See row information)    Current Medications (verified) Outpatient Encounter Medications as of 09/16/2023  Medication Sig   albuterol  (VENTOLIN  HFA) 108 (90 Base) MCG/ACT inhaler Inhale 2 puffs into the lungs every 6 (six) hours as needed for wheezing or shortness of breath.   buPROPion  (WELLBUTRIN   SR) 200 MG 12 hr tablet Take 200 mg by mouth in the morning.   chlordiazePOXIDE (LIBRIUM) 5 MG capsule Take 10 mg by mouth at bedtime.   desvenlafaxine  (PRISTIQ ) 50 MG 24 hr tablet Take 50 mg by mouth every morning.   furosemide   (LASIX ) 40 MG tablet Take 1 tablet (40 mg total) by mouth daily.   Multiple Vitamin (MULTIVITAMIN) tablet Take 1 tablet by mouth daily.   Omega-3 Fatty Acids (OMEGA-3 FISH OIL PO) Take 1 capsule by mouth every evening.  (Patient not taking: Reported on 08/27/2023)   pravastatin  (PRAVACHOL ) 40 MG tablet Take 1 tablet (40 mg total) by mouth daily.   rivaroxaban  (XARELTO ) 20 MG TABS tablet TAKE 1 TABLET EVERY DAY WITH SUPPER   Semaglutide -Weight Management (WEGOVY ) 0.25 MG/0.5ML SOAJ Inject 0.25 mg into the skin once a week. (Patient not taking: Reported on 08/27/2023)   spironolactone  (ALDACTONE ) 25 MG tablet Take 1 tablet (25 mg total) by mouth daily.   traZODone  (DESYREL ) 50 MG tablet Take 100 mg by mouth at bedtime.   [DISCONTINUED] amLODipine  (NORVASC ) 5 MG tablet Take 1 tablet (5 mg total) by mouth daily.   No facility-administered encounter medications on file as of 09/16/2023.    Allergies (verified) Lisinopril   History: Past Medical History:  Diagnosis Date   Arthritis    osteoarthritis , kness.   Chronic hypoxemic respiratory failure (HCC) 11/18/2013   8/26 CTA >>> extensive b/l PE's with evidence of right heart strain c/w at least submassive PE.  8/26 Echo >>> EF 55%, grade 1 diastolic dysfx, mod/severe RV dilation with mod RV systolic dysfx, PAS 51 mmHg  8/27 LE Dopplers >>>negative 01/2014 Echo> EF 60%, RV upper limits of normal, cannot estimate RVSP    Chronic kidney disease    Renal insuffucinecy, cr-1.33 in 10/2005.   Depression    followed by Dr. Vincente   Diverticulosis of colon    Embolism and thrombosis of unspecified artery (HCC) 01/11/2020   Fibrocystic breast disease    History of hepatitis B    History of pulmonary embolism 01/2002   Hyperlipidemia    Hypertension    Morbid obesity with body mass index (BMI) of 50.0 to 59.9 in adult [facility]) 01/19/2006        Neck nodule    not erythematous, movable like cyst located at the base of posterior neck on the left  side(not painful), will need follow up for    Obesity    Obstructive sleep apnea    cpap    Pulmonary nodule    Stable on repeat imaging. LLL nodule 5 mm   Vasovagal near-syncope 06/28/2020   Acute episode around 10:30 am when preparing to leave today's visit.  Had been sitting in chair for 2 hours; minimal sleep night prior; last meal was popcorn and sips of water around 1 am.  I feel dizzy she noted while sitting - recheck BP not initially measurable, pulses faint, became cool and  diaphoretic and minimally verbally responsive though did not completely lose consciousness.  Denied d   Past Surgical History:  Procedure Laterality Date   CHOLECYSTECTOMY N/A 08/09/2015   Procedure: LAPAROSCOPIC CHOLECYSTECTOMY WITH INTRAOPERATIVE CHOLANGIOGRAM;  Surgeon: Debby Shipper, MD;  Location: [facility] OR;  Service: General;  Laterality: N/A;   FLEXIBLE SIGMOIDOSCOPY N/A 04/29/2018   Procedure: ENID MORIN;  Surgeon: Celestia Agent, MD;  Location: [facility] ENDOSCOPY;  Service: Endoscopy;  Laterality: N/A;   GYN surgery     s/p excison of vulvar cyst 10/18/04  by dr. olam Mace- [place]   RIGHT HEART CATH N/A 07/01/2023   Procedure: RIGHT HEART CATH;  Surgeon: Zenaida Morene PARAS, MD;  Location: [facility] INVASIVE CV LAB;  Service: Cardiovascular;  Laterality: N/A;   Family History  Problem Relation Age of Onset   Hypertension Other        family history   Kidney disease Maternal Uncle    Parkinson's disease Brother    Hypertension Mother    Social History   Socioeconomic History   Marital status: Widowed    Spouse name: Not on file   Number of children: Not on file   Years of education: Not on file   Highest education level: Not on file  Occupational History   Occupation: Retired   Occupation: Disabled  Tobacco Use   Smoking status: Former    Current packs/day: 0.00    Average packs/day: 1 pack/day for 20.0 years (20.0 ttl pk-yrs)    Types: Cigarettes    Start date: 03/08/1964    Quit date:  03/08/1984    Years since quitting: 39.5    Passive exposure: Past   Smokeless tobacco: Never  Vaping Use   Vaping status: Never Used  Substance and Sexual Activity   Alcohol use: No    Alcohol/week: 0.0 standard drinks of alcohol   Drug use: No   Sexual activity: Not Currently  Other Topics Concern   Not on file  Social History Narrative   Grew up in government housing. Received college education, had career Education officer, community, Veterinary surgeon, others), and own home before depression dx. Now back in government housing.    Started smoking in her teens and quit in her 60s.  She never smoked more than 1 pack/day.  Likely, less than a 20-pack-year history      Current Social History 07/19/2020      Patient lives alone in a ground floor apartment which is 1 story. There are not steps up to the entrance the patient uses.       Patient's method of transportation is personal car.      The highest level of education was Bachelor's Degree.      The patient currently retired      Identified important Relationships are I talk with my sister in [place] every night.       Pets : I had to put down my 44.5 yo dog in July 2020. Made a goal to look into fostering a pet.       Interests / Fun: TV, play games and read articles on internet.        Current Stressors: I don't have any right now.       Religious / Personal Beliefs: Christian       L. Ducatte, BSN, RN-BC        Social Drivers of Health   Financial Resource Strain: Low Risk  (09/16/2023)   Overall Financial Resource Strain (CARDIA)    Difficulty of Paying Living Expenses: Not very hard  Food Insecurity: No Food Insecurity (09/16/2023)   Hunger Vital Sign    Worried About Running Out of Food in the Last Year: Never true    Ran Out of Food in the Last Year: Never true  Transportation Needs: No Transportation Needs (09/16/2023)   PRAPARE - Administrator, Civil Service (Medical): No    Lack of Transportation (Non-Medical): No   Physical Activity: Inactive (09/16/2023)   Exercise Vital Sign    Days of Exercise per Week: 0  days    Minutes of Exercise per Session: 0 min  Stress: No Stress Concern Present (09/16/2023)   [employer] of Occupational Health - Occupational Stress Questionnaire    Feeling of Stress: Not at all  Social Connections: Socially Isolated (09/16/2023)   Social Connection and Isolation Panel    Frequency of Communication with Friends and Family: More than three times a week    Frequency of Social Gatherings with Friends and Family: Never    Attends Religious Services: Never    Database administrator or Organizations: No    Attends Banker Meetings: Never    Marital Status: Widowed    Tobacco Counseling Counseling given: Not Answered    Clinical Intake:  Pre-visit preparation completed: Yes  Pain : No/denies pain Pain Score: 0-No pain     BMI - recorded: 47.9 Nutritional Status: BMI > 30  Obese Nutritional Risks: None Diabetes: No  Lab Results  Component Value Date   HGBA1C 6.1 (H) 08/28/2022   HGBA1C 5.8 (A) 03/27/2022   HGBA1C 5.5 07/25/2021     How often do you need to have someone help you when you read instructions, pamphlets, or other written materials from your doctor or pharmacy?: 1 - Never What is the last grade level you completed in school?: COLLEGE GRADUATE  Interpreter Needed?: No  Information entered by :: Lotoya Casella N. Marializ Ferrebee, LPN.   Activities of Daily Living     09/16/2023    1:22 PM 12/30/2022   10:51 AM  In your present state of health, do you have any difficulty performing the following activities:  Hearing? 0 0  Vision? 0 1  Difficulty concentrating or making decisions? 1 0  Walking or climbing stairs? 1 1  Dressing or bathing? 0 0  Doing errands, shopping? 0 0  Preparing Food and eating ? N   Using the Toilet? N   In the past six months, have you accidently leaked urine? Y   Do you have problems with loss of bowel  control? N   Managing your Medications? N   Managing your Finances? N   Housekeeping or managing your Housekeeping? N     Patient Care Team: Trudy Mliss Dragon, MD as PCP - General (Internal Medicine) Octavia Bruckner, MD as Consulting Physician (Ophthalmology) Plyler, Arland HERO, RD as Dietitian (Dietician) Gaston Hamilton, MD as Consulting Physician (Urology) Vincente Murrain, MD as Consulting Physician (Psychiatry)  I have updated your Care Teams any recent Medical Services you may have received from other providers in the past year.     Assessment:   This is a routine wellness examination for [name].  Hearing/Vision screen Hearing Screening - Comments:: Denies hearing difficulties.  Vision Screening - Comments:: Wears reading glasses - up to date with [facility] with [facility]    Goals Addressed             This Visit's Progress    09/16/23: To be able to walk and breathe on my own w/o assistance.         Depression Screen     09/16/2023    1:16 PM 12/30/2022   10:39 AM 10/07/2022   10:51 AM 09/10/2022    1:24 PM 03/27/2022   12:05 PM 03/27/2022   11:55 AM 10/10/2021   12:01 PM  PHQ 2/9 Scores  PHQ - 2 Score 0 5 0 6 5 3 5   PHQ- 9 Score 3 11 3 14 13  13     Fall  Risk     09/16/2023    1:15 PM 05/18/2023   10:59 AM 12/30/2022   10:51 AM 10/07/2022   10:49 AM 09/22/2022    2:56 PM  Fall Risk   Falls in the past year? 1 1 0 1 1  Number falls in past yr: 1 0 0 1 1  Injury with Fall? 0 1 0 1 1  Risk for fall due to : Impaired balance/gait;Impaired mobility Impaired balance/gait;Impaired mobility  Impaired balance/gait Impaired balance/gait  Follow up Falls evaluation completed;Education provided  Falls evaluation completed Falls evaluation completed Falls evaluation completed    MEDICARE RISK AT HOME:  Medicare Risk at Home Any stairs in or around the home?: No If so, are there any without handrails?: No Home free of loose throw rugs in walkways,  pet beds, electrical cords, etc?: Yes Adequate lighting in your home to reduce risk of falls?: Yes Life alert?: Yes Use of a cane, walker or w/c?: Yes Grab bars in the bathroom?: Yes Shower chair or bench in shower?: Yes Elevated toilet seat or a handicapped toilet?: Yes  TIMED UP AND GO:  Was the test performed?  No  Cognitive Function: Impaired: Patient has current diagnosis of cognitive impairment.    09/16/2023    1:15 PM  MMSE - Mini Mental State Exam  Not completed: Unable to complete        09/10/2022    1:20 PM  6CIT Screen  What Year? 0 points  What month? 0 points  What time? 0 points  Count back from 20 0 points  Months in reverse 0 points  Repeat phrase 0 points  Total Score 0 points    Immunizations Immunization History  Administered Date(s) Administered   Fluad Trivalent(High Dose 65+) 12/30/2022   Influenza Split 12/25/2010   Influenza Whole 01/15/2009, 11/26/2009   Influenza, Seasonal, Injecte, Preservative Fre 01/27/2012   Influenza,inj,Quad PF,6+ Mos 12/27/2012, 11/14/2013, 04/05/2015, 11/22/2015, 11/28/2016, 02/04/2018, 01/20/2019, 01/05/2020   PFIZER(Purple Top)SARS-COV-2 Vaccination 05/30/2019, 06/22/2019, 04/10/2020, 02/09/2022   PPD Test 02/04/2019   Pneumococcal Conjugate-13 07/27/2014   Pneumococcal Polysaccharide-23 12/27/2012   RSV,unspecified 02/09/2022   Td 01/15/2009   Tdap 03/15/2020   Zoster, Live 12/29/2012    Screening Tests Health Maintenance  Topic Date Due   Zoster Vaccines- Shingrix (1 of 2) 04[birth year]28/1966   INFLUENZA VACCINE  10/02/2023   Medicare Annual Wellness (AWV)  09/15/2024   DTaP/Tdap/Td (3 - Td or Tdap) 03/15/2030   Pneumococcal Vaccine: 50+ Years  Completed   DEXA SCAN  Completed   Hepatitis C Screening  Completed   Hepatitis B Vaccines  Aged Out   HPV VACCINES  Aged Out   Meningococcal B Vaccine  Aged Out   Colonoscopy  Discontinued   COVID-19 Vaccine  Discontinued    Health Maintenance  Health  Maintenance Due  Topic Date Due   Zoster Vaccines- Shingrix (1 of 2) 04[birth year]28/1964   Health Maintenance Items Addressed: Yes Patient aware of current care gaps.  Immunization record was verified by [facility] and updated in patient's chart. Patient is due Shingrix vaccines.  Additional Screening:  Vision Screening: Recommended annual ophthalmology exams for early detection of glaucoma and other disorders of the eye. Would you like a referral to an eye doctor? No    Dental Screening: Recommended annual dental exams for proper oral hygiene  Community Resource Referral / Chronic Care Management: CRR required this visit?  No   CCM required this visit?  No   Plan:  I have personally reviewed and noted the following in the patient's chart:   Medical and social history Use of alcohol, tobacco or illicit drugs  Current medications and supplements including opioid prescriptions. Patient is not currently taking opioid prescriptions. Functional ability and status Nutritional status Physical activity Advanced directives List of other physicians Hospitalizations, surgeries, and ER visits in previous 12 months Vitals Screenings to include cognitive, depression, and falls Referrals and appointments  In addition, I have reviewed and discussed with patient certain preventive protocols, quality metrics, and best practice recommendations. A written personalized care plan for preventive services as well as general preventive health recommendations were provided to patient.   Roz LOISE Fuller, LPN   2/83/7974   After Visit Summary: (MyChart) Due to this being a telephonic visit, the after visit summary with patients personalized plan was offered to patient via MyChart   Notes: Patient aware of current care gaps.  Immunization record was verified by [facility] and updated in patient's chart. Patient is due Shingrix vaccines.

## 2023-09-16 NOTE — Patient Instructions (Addendum)
 [name] , Thank you for taking time out of your busy schedule to complete your Annual Wellness Visit with me. I enjoyed our conversation and look forward to speaking with you again next year. I, as well as your care team,  appreciate your ongoing commitment to your health goals. Please review the following plan we discussed and let me know if I can assist you in the future. Your Game plan/ To Do List    Referrals: If you haven't heard from the office you've been referred to, please reach out to them at the phone provided.   Follow up Visits: Next Medicare AWV with our clinical staff: 09/21/2024 at 1:00 p.m. phone visit with Nurse Roz   Have you seen your provider in the last 6 months (3 months if uncontrolled diabetes)? Yes Next Office Visit with your provider: Office will call to schedule appointment with Dr. Trudy  Clinician Recommendations:  Aim for 30 minutes of exercise or brisk walking, 6-8 glasses of water, and 5 servings of fruits and vegetables each day.       This is a list of the screening recommended for you and due dates:  Health Maintenance  Topic Date Due   Zoster (Shingles) Vaccine (1 of 2) 04/28/1966   Flu Shot  10/02/2023   Medicare Annual Wellness Visit  09/15/2024   DTaP/Tdap/Td vaccine (3 - Td or Tdap) 03/15/2030   Pneumococcal Vaccine for age over 51  Completed   DEXA scan (bone density measurement)  Completed   Hepatitis C Screening  Completed   Hepatitis B Vaccine  Aged Out   HPV Vaccine  Aged Out   Meningitis B Vaccine  Aged Out   Colon Cancer Screening  Discontinued   COVID-19 Vaccine  Discontinued    Advanced directives: (In Chart) A copy of your advanced directives are scanned into your chart should your provider ever need it. Advance Care Planning is important because it:  [x]  Makes sure you receive the medical care that is consistent with your values, goals, and preferences  [x]  It provides guidance to your family and loved ones and reduces  their decisional burden about whether or not they are making the right decisions based on your wishes.  Follow the link provided in your after visit summary or read over the paperwork we have mailed to you to help you started getting your Advance Directives in place. If you need assistance in completing these, please reach out to us  so that we can help you!  See attachments for Preventive Care and Fall Prevention Tips.

## 2023-10-07 ENCOUNTER — Other Ambulatory Visit: Payer: Self-pay | Admitting: Internal Medicine

## 2023-10-07 DIAGNOSIS — R0609 Other forms of dyspnea: Secondary | ICD-10-CM

## 2023-10-07 MED ORDER — RIVAROXABAN 20 MG PO TABS: ORAL_TABLET | ORAL | 0 refills | Status: DC

## 2023-10-07 MED ORDER — FUROSEMIDE 40 MG PO TABS: 40.0000 mg | ORAL_TABLET | Freq: Every day | ORAL | 0 refills | Status: DC

## 2023-10-07 NOTE — Telephone Encounter (Signed)
 Copied from CRM [id number]. Topic: Clinical - Medication Refill >> Oct 07, 2023 10:33 AM Graeme ORN wrote: Medication:  furosemide  (LASIX ) 40 MG tablet rivaroxaban  (XARELTO ) 20 MG TABS tablet   Has the patient contacted their pharmacy? Yes, pharmacy requested. Stated originally requested 7/28. Not showing  (Agent: If no, request that the patient contact the pharmacy for the refill. If patient does not wish to contact the pharmacy document the reason why and proceed with request.) (Agent: If yes, when and what did the pharmacy advise?)  This is the patient's preferred pharmacy:  [facility] - [place], [place] - [address] [place] [address] Phone: [phone] Fax: [phone]   Is this the correct pharmacy for this prescription? Yes If no, delete pharmacy and type the correct one.   Has the prescription been filled recently? Yes  Is the patient out of the medication? N/A  Has the patient been seen for an appointment in the last year OR does the patient have an upcoming appointment? Yes  Can we respond through MyChart? No  Agent: Please be advised that Rx refills may take up to 3 business days. We ask that you follow-up with your pharmacy.

## 2023-10-14 NOTE — Telephone Encounter (Signed)
 Patient sent message via my chart to contact our office to schedule an appointment.

## 2023-10-20 ENCOUNTER — Other Ambulatory Visit: Payer: Self-pay

## 2023-10-20 DIAGNOSIS — R7303 Prediabetes: Secondary | ICD-10-CM

## 2023-10-20 DIAGNOSIS — G4733 Obstructive sleep apnea (adult) (pediatric): Secondary | ICD-10-CM

## 2023-10-20 DIAGNOSIS — Z6841 Body Mass Index (BMI) 40.0 and over, adult: Secondary | ICD-10-CM

## 2023-10-30 ENCOUNTER — Other Ambulatory Visit: Payer: Self-pay | Admitting: Internal Medicine

## 2023-10-30 DIAGNOSIS — Z6841 Body Mass Index (BMI) 40.0 and over, adult: Secondary | ICD-10-CM

## 2023-10-30 MED ORDER — WEGOVY 0.5 MG/0.5ML ~~LOC~~ SOAJ: 0.5000 mg | SUBCUTANEOUS | 5 refills | Status: DC

## 2023-11-03 ENCOUNTER — Ambulatory Visit: Payer: Self-pay | Admitting: Internal Medicine

## 2023-11-03 VITALS — BP 111/72 | HR 86 | Temp 98.6°F | Ht 68.0 in | Wt 301.5 lb

## 2023-11-03 DIAGNOSIS — E66813 Obesity, class 3: Secondary | ICD-10-CM

## 2023-11-03 DIAGNOSIS — K639 Disease of intestine, unspecified: Secondary | ICD-10-CM

## 2023-11-03 DIAGNOSIS — Z8639 Personal history of other endocrine, nutritional and metabolic disease: Secondary | ICD-10-CM

## 2023-11-03 DIAGNOSIS — N3000 Acute cystitis without hematuria: Secondary | ICD-10-CM

## 2023-11-03 DIAGNOSIS — N3946 Mixed incontinence: Secondary | ICD-10-CM

## 2023-11-03 DIAGNOSIS — R059 Cough, unspecified: Secondary | ICD-10-CM

## 2023-11-03 DIAGNOSIS — R300 Dysuria: Secondary | ICD-10-CM

## 2023-11-03 DIAGNOSIS — R7303 Prediabetes: Secondary | ICD-10-CM | POA: Diagnosis not present

## 2023-11-03 DIAGNOSIS — R55 Syncope and collapse: Secondary | ICD-10-CM

## 2023-11-03 DIAGNOSIS — Z8679 Personal history of other diseases of the circulatory system: Secondary | ICD-10-CM

## 2023-11-03 DIAGNOSIS — Z6841 Body Mass Index (BMI) 40.0 and over, adult: Secondary | ICD-10-CM

## 2023-11-03 DIAGNOSIS — M170 Bilateral primary osteoarthritis of knee: Secondary | ICD-10-CM

## 2023-11-03 DIAGNOSIS — E876 Hypokalemia: Secondary | ICD-10-CM

## 2023-11-03 DIAGNOSIS — R202 Paresthesia of skin: Secondary | ICD-10-CM

## 2023-11-03 DIAGNOSIS — Z9981 Dependence on supplemental oxygen: Secondary | ICD-10-CM

## 2023-11-03 DIAGNOSIS — Z79899 Other long term (current) drug therapy: Secondary | ICD-10-CM

## 2023-11-03 DIAGNOSIS — J9611 Chronic respiratory failure with hypoxia: Secondary | ICD-10-CM

## 2023-11-03 NOTE — Progress Notes (Signed)
 78 y.o. [name] is here for routine follow-up of multiple chronic conditions.  Since last visit with me patient has seen both cardiology and pulmonology for evaluation an management of chronic hypoxia, multifactorial, though largely attributed to pulmonary HTN.   In May 2025, moved to independent living in [place], called [facility].  She is enjoying it.  One meal/day served in dining room.  She is meeting new people.  Still feeling poorly motivated.  Today she is reporting worsening UI. Worse when she is in the bed, has more control during day.  Definitely has a stress component (cough).  Must change bed sheets.  Had been going to Dr. McDiarmid, urologist; no benefit with pills.  She wonders if there are any gyno-urologists or other urologists in [place] area/[place].  I believe I have a UTI Reports tingling with passage of urine. No systemic symptoms.  Fainted last night, hadn't had anything to eat or drink, was sitting on her chair in her room and doesn't remember passing out.  Was able to get up on her own.   I think I need some potassium - L hand numbness/tingling  Constipation; went days without BM, wondered about Wegovy  being the problem (she had read about gastroparesis). Eats fiber.  Didn't realize that we don't need to have a bm every day.  Never had discomfort.  Cough: two weeks, hurts, uncomfortable. Tries to produced sputum but not much.  Doesn't feel like a tickle,  happens mostly when lying down.  She's not sure if she's having heartburn.    On 2L 02, wants a new small portable concentrator  Knees don't hurt anymore upon loss of weight which is great!  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 06/05/2021   Multinodular goiter 06/21/2019   Centrilobular emphysema (HCC) 03/12/2019   Abdominal aortic atherosclerosis (HCC) 02/13/2019   No natural teeth 12/18/2016   Exertional dyspnea with hypoxia    Mixed incontinence 04/05/2015   Osteopenia 03/08/2014   Prediabetes 04/04/2013   Preventative health care 10/21/2010   Hyperlipidemia 03/07/2006   Class 3 severe obesity due to excess calories with serious comorbidity and body mass index (BMI) of 50.0 to 59.9 in adult 01/19/2006   Severe major depression (HCC) 01/19/2006   OSA (obstructive sleep apnea) 01/19/2006   History of hypertension 01/19/2006   OA (osteoarthritis) of knee 01/19/2006   History of hepatitis B 01/19/2006   Personal history of submassive PE 2015 requiring tpa; on lifelong anticoagulation 01/19/2006   Current Outpatient Medications:    albuterol  (VENTOLIN  HFA) 108 (90 Base) MCG/ACT inhaler, Inhale 2 puffs into the lungs every 6 (six) hours as needed for wheezing or shortness of breath., Disp: 8 g, Rfl: 6   buPROPion  (WELLBUTRIN  SR) 200 MG 12 hr tablet, Take 200 mg by mouth in the morning., Disp: , Rfl:    chlordiazePOXIDE (LIBRIUM) 5 MG capsule, Take 10 mg by mouth at bedtime., Disp: , Rfl:    desvenlafaxine  (PRISTIQ ) 50 MG 24 hr tablet, Take 50 mg by mouth every morning., Disp: , Rfl:    furosemide  (LASIX ) 40 MG tablet, Take 1 tablet (40 mg total) by mouth daily., Disp: 90 tablet, Rfl:  0   Multiple Vitamin (MULTIVITAMIN) tablet, Take 1 tablet by mouth daily., Disp: 30 tablet, Rfl: 3   Omega-3 Fatty Acids (OMEGA-3 FISH OIL PO), Take 1 capsule by mouth every evening.  (Patient not taking: Reported on 08/27/2023), Disp: , Rfl:    pravastatin  (PRAVACHOL ) 40 MG tablet, Take 1 tablet (40 mg total) by mouth daily., Disp: 90 tablet, Rfl: 3   rivaroxaban  (XARELTO ) 20 MG TABS tablet, TAKE 1 TABLET EVERY DAY WITH SUPPER, Disp: 90 tablet,  Rfl: 0   semaglutide -weight management (WEGOVY ) 0.5 MG/0.5ML SOAJ SQ injection, Inject 0.5 mg into the skin once a week., Disp: 2 mL, Rfl: 5   spironolactone  (ALDACTONE ) 25 MG tablet, Take 1 tablet (25 mg total) by mouth daily., Disp: 90 tablet, Rfl: 3   traZODone  (DESYREL ) 50 MG tablet, Take 100 mg by mouth at bedtime., Disp: , Rfl:   Functional Status: Independent in ADLs and IADLs. Drives. Lives in [facility] in [place].   Objective BP 111/72 (BP Location: Right Arm, Patient Position: Sitting)   Pulse 86   Temp 98.6 F (37 C) (Oral)   Ht 5' 8 (1.727 m)   Wt (!) 301 lb 8 oz (136.8 kg)   SpO2 97% Comment: 2 liters  BMI 45.84 kg/m   Exam: Ms. Chamblee looks wonderful - she is well appearing and moves much more comfortably.  Lungs are clear throughout.  Heart RRR. Good skin turgor.  Normal pulses and color left hand. Sensation intact.   Assessment and Plan:  Chronic hypoxic respiratory failure, on home oxygen  therapy (HCC) -     Ambulatory Referral for DME (small portable oxygen  concentrator)  Prediabetes Assessment & Plan: A1c 5.4, improved with weight loss. Will resolve problem, monitor periodically.  Orders: -     Hemoglobin A1c  Hypokalemia (hx of) -     Basic metabolic panel with GFR  History of hypertension -     Basic metabolic panel with GFR  Mixed incontinence Assessment & Plan: Symptoms are severe and negatively affecting QOL.  Both stress and urge components. Minimal improvement with medications.  Managed in past at [facility]. Wears pullups. Requests referral to urology or gyn in [place] now that she's moved.   Orders: -     Ambulatory referral to Gynecology  Primary osteoarthritis of both knees Assessment & Plan: Knee pain  has resolved with weight loss.  Continue to monitor for flares of pain.  Acute cystitis without hematuria Assessment & Plan: Dysuria without hematuria; no systemic sxs.  UA positive nitrates.   Class 3 severe  obesity with serious comorbidity and body mass index (BMI) of 45.0 to 49.9 in adult Assessment & Plan: Wt peaked 370s one year ago; she has lost 70# with combination of lifestyle changes and more recent addition of Wegovy , tolerated fairly well (about 3-4 episodes of nausea, tolerable) at initial dose, increasing now to 0.5 weekly.  She is now living in an independent living community where [employer] are served.  She is doing very well in her weigh loss journey! Knee pain has resolved. Calorie expenditure remains low; she is very sedentary.  Bowel trouble Assessment & Plan: One episode of no BM x 4 days - not uncomfortable, not difficult to pass when it eventually did. This was concerning to her as she understood that not having a daily BM isn't normal.  Encouraged to consume dietary fibers and drink water, which she is doing.  Reassurance given.   Paresthesia of left arm Assessment &  Plan: C/o LUE and hand tingling, sometimes upon awakening in morning.  No current or previous neck pain.  No weakness.  She was concerned that it could be a sign of low potassium. We discussed the likelihood of cervical nerve impingement, though this wasn't investigated further today due to insufficient time.    Syncope, unspecified syncope type Assessment & Plan: No explanation on history, she had been feeling well, though sometimes doesn't eat and drink on schedule and may have been orthostatic.  She doesn't know if she fell asleep.  She has had a vasovagal presyncopal event in the past (at a prior office visit).  She will monitor her symptoms and try to keep food and drink in her system consistently.    Cough, unspecified type Assessment & Plan: Non productive, occurring mostly when she lies down; lungs clear. She's not sure if she's having reflux symptoms, but does eat prior to lying down on occasion. She'll first work on limiting food before lying down and we'll monitor.

## 2023-11-03 NOTE — Patient Instructions (Addendum)
 Great to see you today, Ms. Boullion!  I'm so excited that you've moved to your new community and that you're enjoying the benefits of your hard-earned weight loss (yay, no knee pain!)  Today we talked about:  1) possibility of UTI - checking urine test, Ill let you know results 2) possibility of pressure ulcer on bottom - none seen 3) cough, particularly after lying down, could be reflux - try not to lay down at least one hour after eating/drinking!  4) tingling/numbless/discomfort in L hand - this is likely a pinched nerve in your neck - very common, and not due to potassium - we are checking your potassium anyway, for other reasons 5) need for new smaller oxygen  tank - I have placed an order, we'll see if insurance will cover it 6) severe incontinence - I have placed referral to [facility] gynecology in [place] - they care for woman with incontinence! 7) poor motivation to get up - I know you can do it!    Let's get together in 3-4 months this time.  Take care and stay well, and don't forget your flu shot this Fall!  Dr. Trudy

## 2023-11-04 ENCOUNTER — Encounter: Payer: Self-pay | Admitting: Internal Medicine

## 2023-11-04 DIAGNOSIS — K639 Disease of intestine, unspecified: Secondary | ICD-10-CM | POA: Insufficient documentation

## 2023-11-04 DIAGNOSIS — N390 Urinary tract infection, site not specified: Secondary | ICD-10-CM | POA: Insufficient documentation

## 2023-11-04 DIAGNOSIS — R202 Paresthesia of skin: Secondary | ICD-10-CM | POA: Insufficient documentation

## 2023-11-04 LAB — MICROSCOPIC EXAMINATION

## 2023-11-04 NOTE — Assessment & Plan Note (Signed)
 Knee pain  has resolved with weight loss.  Continue to monitor for flares of pain.

## 2023-11-04 NOTE — Assessment & Plan Note (Signed)
 C/o LUE and hand tingling, sometimes upon awakening in morning.  No current or previous neck pain.  No weakness.  She was concerned that it could be a sign of low potassium. We discussed the likelihood of cervical nerve impingement, though this wasn't investigated further today due to insufficient time.

## 2023-11-04 NOTE — Assessment & Plan Note (Signed)
 Symptoms are severe and negatively affecting QOL.  Both stress and urge components. Minimal improvement with medications.  Managed in past at [facility]. Wears pullups. Requests referral to urology or gyn in [place] now that she's moved.

## 2023-11-04 NOTE — Assessment & Plan Note (Signed)
 A1c 5.4, improved with weight loss. Will resolve problem, monitor periodically.

## 2023-11-04 NOTE — Assessment & Plan Note (Signed)
 One episode of no BM x 4 days - not uncomfortable, not difficult to pass when it eventually did. This was concerning to her as she understood that not having a daily BM isn't normal.  Encouraged to consume dietary fibers and drink water, which she is doing.  Reassurance given.

## 2023-11-04 NOTE — Assessment & Plan Note (Signed)
 Wt peaked 370s one year ago; she has lost 70# with combination of lifestyle changes and more recent addition of Wegovy , tolerated fairly well (about 3-4 episodes of nausea, tolerable) at initial dose, increasing now to 0.5 weekly.  She is now living in an independent living community where [employer] are served.  She is doing very well in her weigh loss journey! Knee pain has resolved. Calorie expenditure remains low; she is very sedentary.

## 2023-11-04 NOTE — Assessment & Plan Note (Signed)
 Dysuria without hematuria; no systemic sxs.  UA positive nitrates.

## 2023-11-05 LAB — URINALYSIS, ROUTINE W REFLEX MICROSCOPIC
Bilirubin, UA: NEGATIVE
Glucose, UA: NEGATIVE
Ketones, UA: NEGATIVE
Nitrite, UA: POSITIVE — AB
RBC, UA: NEGATIVE
Specific Gravity, UA: 1.026 (ref 1.005–1.030)
Urobilinogen, Ur: 1 mg/dL (ref 0.2–1.0)
pH, UA: 5.5 (ref 5.0–7.5)

## 2023-11-05 LAB — BASIC METABOLIC PANEL WITH GFR
BUN/Creatinine Ratio: 11 — ABNORMAL LOW (ref 12–28)
BUN: 14 mg/dL (ref 8–27)
CO2: 20 mmol/L (ref 20–29)
Calcium: 9.5 mg/dL (ref 8.7–10.3)
Chloride: 96 mmol/L (ref 96–106)
Creatinine, Ser: 1.26 mg/dL — ABNORMAL HIGH (ref 0.57–1.00)
Glucose: 78 mg/dL (ref 70–99)
Potassium: 4.3 mmol/L (ref 3.5–5.2)
Sodium: 136 mmol/L (ref 134–144)
eGFR: 44 mL/min/{1.73_m2} — ABNORMAL LOW

## 2023-11-05 LAB — MICROSCOPIC EXAMINATION
Epithelial Cells (non renal): >10 /HPF — AB (ref 0–10)
RBC, Urine: NONE SEEN /HPF (ref 0–2)
Renal Epithel, UA: NONE SEEN /LPF
WBC, UA: >30 /HPF — AB (ref 0–5)

## 2023-11-05 LAB — HEMOGLOBIN A1C
Est. average glucose Bld gHb Est-mCnc: 108 mg/dL
Hgb A1c MFr Bld: 5.4 % (ref 4.8–5.6)

## 2023-11-20 ENCOUNTER — Telehealth: Payer: Self-pay | Admitting: Cardiology

## 2023-11-24 ENCOUNTER — Ambulatory Visit: Admitting: Obstetrics & Gynecology

## 2023-11-24 ENCOUNTER — Encounter: Payer: Self-pay | Admitting: Obstetrics & Gynecology

## 2023-11-24 ENCOUNTER — Other Ambulatory Visit: Payer: Self-pay | Admitting: Medical Genetics

## 2023-11-24 VITALS — BP 117/73 | HR 81 | Ht 68.0 in | Wt 299.5 lb

## 2023-11-24 DIAGNOSIS — Z7689 Persons encountering health services in other specified circumstances: Secondary | ICD-10-CM | POA: Diagnosis not present

## 2023-11-24 DIAGNOSIS — N3946 Mixed incontinence: Secondary | ICD-10-CM

## 2023-11-24 DIAGNOSIS — R399 Unspecified symptoms and signs involving the genitourinary system: Secondary | ICD-10-CM

## 2023-11-24 LAB — POCT URINALYSIS DIPSTICK
Bilirubin, UA: NEGATIVE
Glucose, UA: NEGATIVE
Ketones, UA: 1.5
Nitrite, UA: POSITIVE
Protein, UA: NEGATIVE
Spec Grav, UA: 1.015 (ref 1.010–1.025)
Urobilinogen, UA: 0.2 U/dL
pH, UA: 5 (ref 5.0–8.0)

## 2023-11-24 MED ORDER — SULFAMETHOXAZOLE-TRIMETHOPRIM 800-160 MG PO TABS: 1.0000 | ORAL_TABLET | Freq: Two times a day (BID) | ORAL | 0 refills | Status: DC

## 2023-11-24 NOTE — Progress Notes (Addendum)
    GYNECOLOGY PROGRESS NOTE  Subjective:    Patient ID: [name], female    DOB: [birth year], 78 y.o.   MRN: [id number]  HPI  Patient is a 78 y.o. widowed P1 (deceased) here today as a new patient with the issue of urinary incontinence. She thinks that she might have a UTI based on labs she saw on My Chart. She denies dysuria. She saw [facility] Marykay at [facility] for a while, last about a year ago. She says that he treated her with a medication but that it didn't work. She got the impression from him that there is not much else he can do.  She has been wearing disposable diapers for a little over 10 years.  She has been abstinent for many years.  The following portions of the patient's history were reviewed and updated as appropriate: allergies, current medications, past family history, past medical history, past social history, past surgical history, and problem list.  Review of Systems Pertinent items are noted in HPI.   Objective:   Blood pressure 117/73, pulse 81, height 5' 8 (1.727 m), weight 299 lb 8 oz (135.9 kg). Body mass index is 45.54 kg/m. Well nourished, well hydrated Black female, no apparent distress She is ambulating slowly with her oxygen  tank and conversing normally. EG- she has a 1 cm boil in her right groin. She told me 3 times that she has hygiene issues. Although she reports that she scrubbed thoroughly prior to this visit.  Her vulva does not show the changes associated with chronic exposure to urine/diapers. I did a speculum exam and saw no abnormal discharge She also has a 1 cm probable open comendone on her inner right butt cheek Per her request, I took a picture of her vulva for her on her phone.   Assessment:   1. Establishing care with new doctor, encounter for   2. Mixed incontinence   3.      UTI- treat with bactrim  DS for 10 days. This should also cover the small boil on her right groin.  Plan:   1. Establishing care with new  doctor, encounter for (Primary)   2. Mixed incontinence - check urine culture - I have rec'd a urologist for her incontinence

## 2023-11-24 NOTE — Addendum Note (Signed)
 Addended by: ALAINA BIRMINGHAM R on: 11/24/2023 11:30 AM   Modules accepted: Orders

## 2023-11-27 LAB — URINE CULTURE

## 2023-11-29 DIAGNOSIS — R55 Syncope and collapse: Secondary | ICD-10-CM | POA: Insufficient documentation

## 2023-11-29 NOTE — Assessment & Plan Note (Signed)
 No explanation on history, she had been feeling well, though sometimes doesn't eat and drink on schedule and may have been orthostatic.  She doesn't know if she fell asleep.  She has had a vasovagal presyncopal event in the past (at a prior office visit).  She will monitor her symptoms and try to keep food and drink in her system consistently.

## 2023-11-29 NOTE — Assessment & Plan Note (Signed)
 Non productive, occurring mostly when she lies down; lungs clear. She's not sure if she's having reflux symptoms, but does eat prior to lying down on occasion. She'll first work on limiting food before lying down and we'll monitor.

## 2023-11-30 ENCOUNTER — Telehealth: Payer: Self-pay | Admitting: Cardiology

## 2023-11-30 NOTE — Telephone Encounter (Signed)
 Called to confirm/remind patient of their appointment at the [facility] on 12/01/23.   Appointment:   [x] Confirmed  [] Left mess   [] No answer/No voice mail  [] VM Full/unable to leave message  [] Phone not in service  Patient reminded to bring all medications and/or complete list.  Confirmed patient has transportation. Gave directions, instructed to utilize valet parking.

## 2023-12-01 ENCOUNTER — Other Ambulatory Visit (HOSPITAL_COMMUNITY): Payer: Self-pay

## 2023-12-01 ENCOUNTER — Other Ambulatory Visit: Payer: Self-pay

## 2023-12-01 ENCOUNTER — Telehealth: Payer: Self-pay | Admitting: Pharmacist

## 2023-12-01 ENCOUNTER — Ambulatory Visit: Attending: Cardiology | Admitting: Cardiology

## 2023-12-01 VITALS — BP 123/72 | HR 94 | Wt 298.0 lb

## 2023-12-01 DIAGNOSIS — Z6841 Body Mass Index (BMI) 40.0 and over, adult: Secondary | ICD-10-CM | POA: Diagnosis not present

## 2023-12-01 DIAGNOSIS — F329 Major depressive disorder, single episode, unspecified: Secondary | ICD-10-CM | POA: Diagnosis not present

## 2023-12-01 DIAGNOSIS — J439 Emphysema, unspecified: Secondary | ICD-10-CM | POA: Insufficient documentation

## 2023-12-01 DIAGNOSIS — I272 Pulmonary hypertension, unspecified: Secondary | ICD-10-CM | POA: Diagnosis present

## 2023-12-01 DIAGNOSIS — Z79899 Other long term (current) drug therapy: Secondary | ICD-10-CM | POA: Insufficient documentation

## 2023-12-01 DIAGNOSIS — Z86711 Personal history of pulmonary embolism: Secondary | ICD-10-CM | POA: Diagnosis not present

## 2023-12-01 DIAGNOSIS — G4733 Obstructive sleep apnea (adult) (pediatric): Secondary | ICD-10-CM

## 2023-12-01 DIAGNOSIS — Z7901 Long term (current) use of anticoagulants: Secondary | ICD-10-CM | POA: Insufficient documentation

## 2023-12-01 DIAGNOSIS — G4730 Sleep apnea, unspecified: Secondary | ICD-10-CM | POA: Insufficient documentation

## 2023-12-01 DIAGNOSIS — E669 Obesity, unspecified: Secondary | ICD-10-CM | POA: Diagnosis not present

## 2023-12-01 DIAGNOSIS — E66813 Obesity, class 3: Secondary | ICD-10-CM

## 2023-12-01 DIAGNOSIS — Z7985 Long-term (current) use of injectable non-insulin antidiabetic drugs: Secondary | ICD-10-CM | POA: Diagnosis not present

## 2023-12-01 DIAGNOSIS — I5032 Chronic diastolic (congestive) heart failure: Secondary | ICD-10-CM | POA: Diagnosis not present

## 2023-12-01 DIAGNOSIS — J9611 Chronic respiratory failure with hypoxia: Secondary | ICD-10-CM

## 2023-12-01 DIAGNOSIS — Z9981 Dependence on supplemental oxygen: Secondary | ICD-10-CM

## 2023-12-01 DIAGNOSIS — R0602 Shortness of breath: Secondary | ICD-10-CM | POA: Diagnosis not present

## 2023-12-01 MED ORDER — SILDENAFIL CITRATE 20 MG PO TABS: 20.0000 mg | ORAL_TABLET | Freq: Three times a day (TID) | ORAL | 1 refills | Status: DC

## 2023-12-01 NOTE — Patient Instructions (Addendum)
 Medication Changes:  Start Sildenafil 20 MG 3 times daily   Follow-Up in: 3 months with Ellouise Class, FNP.  Our Doctors' schedules are NOT open yet for 3 months. We will place you on our recall list. Once they are available, we will call you to schedule your follow up appointment.    Thank you for choosing [facility].    At the [facility], you and your health needs are our priority. We have a designated team specialized in the treatment of Heart Failure. This Care Team includes your primary Heart Failure Specialized Cardiologist (physician), Advanced Practice Providers (APPs- Physician Assistants and Nurse Practitioners), and Pharmacist who all work together to provide you with the care you need, when you need it.   You may see any of the following providers on your designated Care Team at your next follow up:  Dr. Toribio Fuel Dr. Ezra Shuck Dr. Ria Commander Dr. Morene Brownie Ellouise Class, FNP Jaun Bash, RPH-CPP  Please be sure to bring in all your medications bottles to every appointment.   Need to Contact Us :  If you have any questions or concerns before your next appointment please send us  a message through [place] or call our office at [phone].    TO LEAVE A MESSAGE FOR THE NURSE SELECT OPTION 2, PLEASE LEAVE A MESSAGE INCLUDING: YOUR NAME DATE OF BIRTH CALL BACK NUMBER REASON FOR CALL**this is important as we prioritize the call backs  YOU WILL RECEIVE A CALL BACK THE SAME DAY AS LONG AS YOU CALL BEFORE 4:00 PM

## 2023-12-01 NOTE — Telephone Encounter (Signed)
 Prior authorization for sildenafil approved. Per Dr. Zenaida, sildenafil 20 mg TID sent to patient's preferred pharmacy.

## 2023-12-02 ENCOUNTER — Telehealth: Payer: Self-pay | Admitting: Pharmacist

## 2023-12-02 NOTE — Telephone Encounter (Signed)
 Discussed sildenafil start instructions and expectations. Questions answered and patient will proceed with sildenafil 20 mg TID.

## 2023-12-04 NOTE — Progress Notes (Signed)
 ADVANCED [facility] FAIL[facility] FOLLOW UP CLINIC NOTE  Referring Physician: Trudy Mliss Dragon, MD  Primary Care: Trudy Mliss Dragon, MD Primary Cardiologist:  HPI: [name] is a 78 y.o. female who presents for follow up of pulmonary hypertension.      Patient with long history of suspected pulmonary hypertension.  She has a history of recurrent pulmonary embolism including EKOS in 2015 with PA pressures during the procedure of 70/30 (49) with a mean of after infusion. She also has been diagnosed with sleep apnea as well as emphysema. PFTs were consistent with mild restriction as well as a DLCO of 44.    She had a V/Q scan in 2024 given her history of multiple clots and ?APS but it was read as normal.    CT scan in 2024 with mild emphysema, saw pulmonary who overall felt it was unimpressive.        SUBJECTIVE:  Weight continues to improve on Wegovy , she still complains of shortness of breath with more than mild exertion.  One of her main complaints is urinary incontinence especially while she is on diuretics.  She is planning to see urology in the near future for this.  Otherwise has been doing well on her current medical therapy.  PMH, current medications, allergies, social history, and family history reviewed in epic.  PHYSICAL EXAM: Vitals:   12/01/23 1142  BP: 123/72  Pulse: 94  SpO2: 98%   GENERAL: [place]coli ill-appearing, wearing oxygen  PULM: Normal work of breathing CARDIAC:  JVP: Flat         Normal rate with regular rhythm. No murmurs, rubs or gallops.  Mild lower extremity edema. Warm and well perfused extremities. ABDOMEN: Soft, non-tender, non-distended. NEUROLOGIC: Patient is oriented x3 with no focal or lateralizing neurologic deficits.    DATA REVIEW  ECG: Normal sinus rhythm, normal QRS, potential RVH   ECHO: 03/01/1931: LVEF 50 to 55%, grade 1 diastolic dysfunction, IVS flattening systole and diastole, poor visualization of the RV    CATH: 06/2023: RA 8, PA 56/22 (40), PCWP 15, TD CO/CI 6.43/2.56, PVR 3.88, PAPi 3.12   PH Workup V/Q scan in 2024 normal CT chest in 2024 with potential early interstitial disease PFTs with severely reduced DLCO, mild restrictive disease, no obstructive disease     Etiology of PH: Workup ongoing, suspect primarily pre-capillary, potentially I and III overlap WHO Functional class: IIIb : Showed up significantly late to her appointment, will do at next visit Volume status: Euvolemic Pulmonary vasodilators: Awaiting therapy Advanced therapies: Not a candidate   Work up: Labs & Orders: ANA by IFA, BMP, LFTs, TSH, NT-proBNP, ECG, ABG, APS (CTEPH) Imaging: V/Q Scan, CXR, TTE, Liver U/S, +/- CMR Functional: , CPET, PFTs/spirometry Hemodynamics: RHC  ASSESSMENT & PLAN:  Pulmonary hypertension:  Moderate PH based on RHC, given relatively normal PFTs and normal V/Q scan would likely benefit from pulmonary vasodilators.  Filling pressures borderline, but after significant weight loss and unloading reasonable to start PDE 5 today.  - Discussed with pharmacy, start sildenafil 20 mg 3 times daily - Right heart catheterization reviewed - Autoimmune workup at next visit -Continue Xarelto  20 mg daily - Continue Lasix  40 mg daily, spironolactone  25 mg daily - Not an SGLT2 candidate with immobility and UTI risk   History of pulmonary embolism: Due to history of antiphospholipid syndrome, but labs were only mildly positive and not diagnostic in the setting of anticoagulation.  Question the history given that she has done well on Xarelto   as opposed to preferred warfarin. - Lifelong anticoagulation - Recent V/Q scan negative   Obesity: Likely contributing to restrictive disease and has significant deconditioning.  - Continue GLP-1, BMI 45   MDD: On therapy - Follow up with PCP   OSA: Continue CPAP therapy.   Follow up in 3 months  Morene Brownie, MD Advanced Heart  Failure Mechanical Circulatory Support 12/04/23

## 2023-12-09 ENCOUNTER — Other Ambulatory Visit

## 2023-12-16 ENCOUNTER — Other Ambulatory Visit: Payer: Self-pay

## 2023-12-16 ENCOUNTER — Other Ambulatory Visit
Admission: RE | Admit: 2023-12-16 | Discharge: 2023-12-16 | Disposition: A | Discharge status: Home / Self Care | Payer: Self-pay | Source: Ambulatory Visit | Attending: Medical Genetics | Admitting: Medical Genetics

## 2023-12-16 DIAGNOSIS — R0609 Other forms of dyspnea: Secondary | ICD-10-CM

## 2023-12-16 MED ORDER — RIVAROXABAN 20 MG PO TABS: ORAL_TABLET | ORAL | 3 refills | Status: AC

## 2023-12-16 MED ORDER — FUROSEMIDE 40 MG PO TABS: 40.0000 mg | ORAL_TABLET | Freq: Every day | ORAL | 3 refills | Status: AC

## 2023-12-18 ENCOUNTER — Telehealth: Payer: Self-pay | Admitting: *Deleted

## 2023-12-18 NOTE — Telephone Encounter (Signed)
 Copied from CRM [id number]. Topic: Referral - Question >> Dec 18, 2023 11:43 AM Miquel SAILOR wrote: Reason for CRM: Ambulatory Referral for DME (Order # [id number]) on 11/03/2023-PT requesting call [facility] on Oxygen  Tank request on 09/02 visit.  Also Requesting a portable toilet be ordered as well. Needs call back      Call back [phone]

## 2023-12-23 ENCOUNTER — Ambulatory Visit: Admitting: Pulmonary Disease

## 2023-12-23 ENCOUNTER — Encounter: Payer: Self-pay | Admitting: Pulmonary Disease

## 2023-12-23 VITALS — BP 110/74 | HR 114 | Temp 98.9°F | Ht 68.0 in | Wt 290.6 lb

## 2023-12-23 DIAGNOSIS — R0602 Shortness of breath: Secondary | ICD-10-CM

## 2023-12-23 DIAGNOSIS — J9611 Chronic respiratory failure with hypoxia: Secondary | ICD-10-CM

## 2023-12-23 NOTE — Progress Notes (Signed)
 [name]    [id number]    Oct 11, 1946  Primary Care Physician:Williams, Mliss Dragon, MD  Referring Physician: Trudy Mliss Dragon, MD [address] [place], [address] [place],  [place] [address]  Chief complaint:   Patient being evaluated for shortness of breath  HPI:  Patient with pulmonary hypertension, obesity hypoventilation, Obstructive sleep apnea on CPAP therapy with oxygen  supplementation at night  Has not been using her CPAP since about July, has been using oxygen  supplementation at night  She continues to work on weight loss Has been on Wegovy   She attributes significant weight gain with antidepressants, this has recently been changed and she has managed to lose about 10 pounds since the change in her medications History of emphysema,, class III severe obesity, major depression Diagnosed with obstructive sleep apnea, on CPAP therapy  Since being on Wegovy  weight is trending down, has lost about 15 pounds  She is on chronic anticoagulation and Spiriva  Respimat -Not noticing any significant change with her breathing with Spiriva  use  Previous smoker quit over 38 years ago, was smoking a pack a day  Outpatient Encounter Medications as of 12/22/2024  Medication Sig   albuterol  (VENTOLIN  HFA) 108 (90 Base) MCG/ACT inhaler Inhale 2 puffs into the lungs every 6 (six) hours as needed for wheezing or shortness of breath.   buPROPion  (WELLBUTRIN  SR) 200 MG 12 hr tablet Take 200 mg by mouth in the morning.   chlordiazePOXIDE (LIBRIUM) 5 MG capsule Take 10 mg by mouth at bedtime.   desvenlafaxine  (PRISTIQ ) 50 MG 24 hr tablet Take 50 mg by mouth every morning.   furosemide  (LASIX ) 40 MG tablet Take 1 tablet (40 mg total) by mouth daily.   Multiple Vitamin (MULTIVITAMIN) tablet Take 1 tablet by mouth daily.   Omega-3 Fatty Acids (OMEGA-3 FISH OIL PO) Take 1 capsule by mouth every evening.    pravastatin  (PRAVACHOL ) 40 MG tablet Take 1 tablet (40 mg total) by  mouth daily.   rivaroxaban  (XARELTO ) 20 MG TABS tablet TAKE 1 TABLET EVERY DAY WITH SUPPER   semaglutide -weight management (WEGOVY ) 0.5 MG/0.5ML SOAJ SQ injection Inject 0.5 mg into the skin once a week.   sildenafil (REVATIO) 20 MG tablet Take 1 tablet (20 mg total) by mouth 3 (three) times daily.   spironolactone  (ALDACTONE ) 25 MG tablet Take 1 tablet (25 mg total) by mouth daily.   traZODone  (DESYREL ) 50 MG tablet Take 100 mg by mouth at bedtime.   [DISCONTINUED] amLODipine  (NORVASC ) 5 MG tablet Take 1 tablet (5 mg total) by mouth daily.   [DISCONTINUED] sulfamethoxazole -trimethoprim  (BACTRIM  DS) 800-160 MG tablet Take 1 tablet by mouth 2 (two) times daily. (Patient not taking: Reported on 12/01/2023)   No facility-administered encounter medications on file as of 12/22/2024.    Allergies as of 12/22/2024 - Review Complete 12/23/2023  Allergen Reaction Noted   Lisinopril Cough 09/24/2006    Past Medical History:  Diagnosis Date   Antiphospholipid antibody syndrome 12/18/2022   Arthritis    osteoarthritis , kness.   Chronic hypoxemic respiratory failure (HCC) 11/18/2013   8/26 CTA >>> extensive b/l PE's with evidence of right heart strain c/w at least submassive PE.  8/26 Echo >>> EF 55%, grade 1 diastolic dysfx, mod/severe RV dilation with mod RV systolic dysfx, PAS 51 mmHg  8/27 LE Dopplers >>>negative 01/2014 Echo> EF 60%, RV upper limits of normal, cannot estimate RVSP    Chronic hypoxic respiratory failure, on home oxygen  therapy (HCC)  Chronic kidney disease    Renal insuffucinecy, cr-1.33 in 10/2005.   Class 3 severe obesity with serious comorbidity and body mass index (BMI) of 45.0 to 49.9 in adult [facility]) 01/19/2006            Depression    followed by Dr. Vincente   Diverticulosis of colon    Embolism and thrombosis of unspecified artery (HCC) 01/11/2020   Fibrocystic breast disease    History of hepatitis B    History of hypertension 01/19/2006   At one time required  multiple medications.  HTN resolved with weight loss around 2022-2023.         History of pulmonary embolism 01/2002   Hyperlipidemia    Hypertension    Morbid obesity with body mass index (BMI) of 50.0 to 59.9 in adult [facility]) 01/19/2006        Neck nodule    not erythematous, movable like cyst located at the base of posterior neck on the left side(not painful), will need follow up for    Obesity    Obstructive sleep apnea    cpap    Pulmonary nodule    Stable on repeat imaging. LLL nodule 5 mm   Vasovagal near-syncope 06/28/2020   Acute episode around 10:30 am when preparing to leave today's visit.  Had been sitting in chair for 2 hours; minimal sleep night prior; last meal was popcorn and sips of water around 1 am.  I feel dizzy she noted while sitting - recheck BP not initially measurable, pulses faint, became cool and  diaphoretic and minimally verbally responsive though did not completely lose consciousness.  Denied d    Past Surgical History:  Procedure Laterality Date   CHOLECYSTECTOMY N/A 08/09/2015   Procedure: LAPAROSCOPIC CHOLECYSTECTOMY WITH INTRAOPERATIVE CHOLANGIOGRAM;  Surgeon: Debby Shipper, MD;  Location: [facility];  Service: General;  Laterality: N/A;   FLEXIBLE SIGMOIDOSCOPY N/A 04/29/2018   Procedure: ENID MORIN;  Surgeon: Celestia Agent, MD;  Location: [facility];  Service: Endoscopy;  Laterality: N/A;   GYN surgery     s/p excison of vulvar cyst 10/18/04 by dr. olam Mace- [place]   RIGHT HEART CATH N/A 07/01/2023   Procedure: RIGHT HEART CATH;  Surgeon: Zenaida Morene PARAS, MD;  Location: [facility] INVASIVE CV LAB;  Service: Cardiovascular;  Laterality: N/A;    Family History  Problem Relation Age of Onset   Hypertension Other        family history   Kidney disease Maternal Uncle    Parkinson's disease Brother    Hypertension Mother     Social History   Socioeconomic History   Marital status: Widowed    Spouse name: Not on file   Number of children:  Not on file   Years of education: Not on file   Highest education level: Not on file  Occupational History   Occupation: Retired   Occupation: Disabled  Tobacco Use   Smoking status: Former    Current packs/day: 0.00    Average packs/day: 1 pack/day for 20.0 years (20.0 ttl pk-yrs)    Types: Cigarettes    Start date: 03/08/1964    Quit date: 03/08/1984    Years since quitting: 39.8    Passive exposure: Past   Smokeless tobacco: Never  Vaping Use   Vaping status: Never Used  Substance and Sexual Activity   Alcohol use: No    Alcohol/week: 0.0 standard drinks of alcohol   Drug use: No   Sexual activity: Not Currently  Birth control/protection: Post-menopausal  Other Topics Concern   Not on file  Social History Narrative   Grew up in government housing. Received college education, had career Education officer, community, Veterinary surgeon, others), and own home before depression dx. Now back in government housing.    Started smoking in her teens and quit in her 71s.  She never smoked more than 1 pack/day.  Likely, less than a 20-pack-year history      Current Social History 07/19/2020      Patient lives alone in a ground floor apartment which is 1 story. There are not steps up to the entrance the patient uses.       Patient's method of transportation is personal car.      The highest level of education was Bachelor's Degree.      The patient currently retired      Identified important Relationships are I talk with my sister in [place] every night.       Pets : I had to put down my 28.5 yo dog in July 2020. Made a goal to look into fostering a pet.       Interests / Fun: TV, play games and read articles on internet.        Current Stressors: I don't have any right now.       Religious / Personal Beliefs: Christian       L. Ducatte, BSN, RN-BC        Social Drivers of Health   Financial Resource Strain: Low Risk  (09/16/2023)   Overall Financial Resource Strain (CARDIA)    Difficulty of  Paying Living Expenses: Not very hard  Food Insecurity: No Food Insecurity (09/16/2023)   Hunger Vital Sign    Worried About Running Out of Food in the Last Year: Never true    Ran Out of Food in the Last Year: Never true  Transportation Needs: No Transportation Needs (09/16/2023)   PRAPARE - Administrator, Civil Service (Medical): No    Lack of Transportation (Non-Medical): No  Physical Activity: Inactive (09/16/2023)   Exercise Vital Sign    Days of Exercise per Week: 0 days    Minutes of Exercise per Session: 0 min  Stress: No Stress Concern Present (09/16/2023)   [employer] of Occupational Health - Occupational Stress Questionnaire    Feeling of Stress: Not at all  Social Connections: Socially Isolated (09/16/2023)   Social Connection and Isolation Panel    Frequency of Communication with Friends and Family: More than three times a week    Frequency of Social Gatherings with Friends and Family: Never    Attends Religious Services: Never    Database administrator or Organizations: No    Attends Banker Meetings: Never    Marital Status: Widowed  Intimate Partner Violence: Not At Risk (09/16/2023)   Humiliation, Afraid, Rape, and Kick questionnaire    Fear of Current or Ex-Partner: No    Emotionally Abused: No    Physically Abused: No    Sexually Abused: No    Review of Systems  Respiratory:  Positive for apnea and shortness of breath.   Psychiatric/Behavioral:  Positive for sleep disturbance.     Vitals:   12/23/23 1505  BP: 110/74  Pulse: (!) 114  Temp: 98.9 F (37.2 C)  SpO2: 90%    Physical Exam Constitutional:      Appearance: She is obese.  HENT:     Head: Normocephalic.     Mouth/Throat:  Mouth: Mucous membranes are moist.  Eyes:     General: No scleral icterus. Cardiovascular:     Rate and Rhythm: Normal rate and regular rhythm.     Heart sounds: No murmur heard.    No friction rub.  Pulmonary:     Effort: No  respiratory distress.     Breath sounds: No stridor. No wheezing or rhonchi.  Musculoskeletal:     Cervical back: No rigidity or tenderness.  Neurological:     Mental Status: She is alert.  Psychiatric:        Mood and Affect: Mood normal.    Data Reviewed: We do not have a compliance from a during this visit Has not been using her CPAP much   Recent CT scan of the chest concerning for pulmonary hypertension, basal interstitial changes, emphysema, bronchiectasis at the bases  Previous pulmonary function test December 2020-spirometry within normal limits but will reduced diffusing capacity  PFT 01/18/2023 shows no obstruction, no significant restriction, severe reduction in diffusing capacity  Previous echocardiogram reviewed-suboptimal but no mention of pulmonary hypertension -Flattening of the interventricular septum consistent with right ventricular pressure and volume overload  Assessment:   Multifactorial chronic hypoxic respiratory failure Obstructive sleep apnea, diastolic dysfunction,  class III obesity - Continue oxygen  supplementation  History of obstructive sleep apnea - Has not been using her CPAP - Encouraged to go back to using CPAP on a nightly basis  Chronic hypoxemic respiratory failure - Continue oxygen  supplementation  history of antiphospholipid syndrome -On anticoagulation chronically  Severe major depression -On medications  Chronic kidney disease, history of prediabetes Diastolic dysfunction on echocardiogram   Plan/Recommendations:  Continue oxygen  supplementation  Rescue inhaler as needed  Encouraged to get back to using CPAP on a nightly basis  Continue Wegovy   Continue weight loss efforts  Follow-up in about 3 months Patient requested to be followed up in the [place] office  If not using CPAP and not tolerating reinitiating CPAP May need a new sleep study  Encouraged to give us  a call with any significant concerns  I  spent 35 minutes reviewing records, interviewing/examining patient, and managing orders. Jennet Epley MD [facility] 12/23/2023, 3:25 PM  CC: Trudy Mliss Dragon, MD

## 2023-12-23 NOTE — Patient Instructions (Signed)
 Schedule for follow-up in 3 months  Schedule to follow-up provider in the [facility] office  Make sure you start using your CPAP again so that we can get a download from it whenever you have your next visit  Good luck with your weight loss efforts  Graded activities as tolerated

## 2023-12-25 LAB — GENECONNECT MOLECULAR SCREEN: Genetic Analysis Overall Interpretation: NEGATIVE

## 2024-01-06 ENCOUNTER — Other Ambulatory Visit (HOSPITAL_COMMUNITY): Payer: Self-pay

## 2024-01-06 MED ORDER — SILDENAFIL CITRATE 20 MG PO TABS: 20.0000 mg | ORAL_TABLET | Freq: Three times a day (TID) | ORAL | 1 refills | Status: DC

## 2024-01-07 NOTE — Telephone Encounter (Signed)
 Will forward  to C. Boone.

## 2024-01-07 NOTE — Telephone Encounter (Signed)
 Please call patient [name] regarding a request, made on 12/18/2023 regarding a light weight oxygen  tank. Patient would like to know the status of this request.  Please advise.

## 2024-01-14 ENCOUNTER — Other Ambulatory Visit: Payer: Self-pay | Admitting: Internal Medicine

## 2024-01-14 DIAGNOSIS — N3946 Mixed incontinence: Secondary | ICD-10-CM

## 2024-01-14 NOTE — Telephone Encounter (Signed)
 Please see the message below and advise if a new Referral can be placed per the request of a different location.   Copied from CRM [id number]. Topic: Referral - Request for Referral >> Jan 07, 2024  2:04 PM Fredrica W wrote: Did the patient discuss referral with their provider in the last year? Yes (If No - schedule appointment) (If Yes - send message)  Appointment offered? No  Type of order/referral and detailed reason for visit: [employer] called to request referral for urogynecology - has been seeing urologist but treatment not working.  Preference of office, provider, location: [place] if possible if not, [place]  If referral order, have you been seen by this specialty before? No (If Yes, this issue or another issue? When? Where?  Can we respond through MyChart? No

## 2024-01-21 ENCOUNTER — Telehealth: Payer: Self-pay | Admitting: Internal Medicine

## 2024-01-21 DIAGNOSIS — Z87891 Personal history of nicotine dependence: Secondary | ICD-10-CM | POA: Diagnosis not present

## 2024-01-21 DIAGNOSIS — Z9981 Dependence on supplemental oxygen: Secondary | ICD-10-CM | POA: Diagnosis not present

## 2024-01-21 DIAGNOSIS — N3946 Mixed incontinence: Secondary | ICD-10-CM | POA: Diagnosis not present

## 2024-01-21 DIAGNOSIS — J9611 Chronic respiratory failure with hypoxia: Secondary | ICD-10-CM | POA: Diagnosis not present

## 2024-01-21 NOTE — Progress Notes (Signed)
 Virtual Visit via Video Note  I connected with [name] on 01/25/24 at  8:45 AM EST by a video enabled telemedicine application and verified that I am speaking with the correct person using two identifiers. 15 minutes spent in this conversation.   Patient Location: Home Provider Location: Office/Clinic  I discussed the limitations, risks, security, and privacy concerns of performing an evaluation and management service by video and the availability of in person appointments. I also discussed with the patient that there may be a patient responsible charge related to this service. The patient expressed understanding and agreed to proceed.  Subjective: [name] continues to experience severe urinary incontinence which is limiting her ability to leave her home and to maintain a dry environment despite use of (expensive) absorptive products and bedding barriers.  It is worsening her depression.  She desires referral to urogynecologist, preferably in [place] where she is now living but acceptable in [place] if no local options (she drives; her car is in the shop, her independent living facility does not provide transportation outside of [place]).  She has recently seen her pulmonologist and her cardiologist.  Sildenafil  has been initiated for pulmonary hypertension.  She request follow-up on a previous order for portable oxygen .  ROS: Per HPI  Current Outpatient Medications:    albuterol  (VENTOLIN  HFA) 108 (90 Base) MCG/ACT inhaler, Inhale 2 puffs into the lungs every 6 (six) hours as needed for wheezing or shortness of breath., Disp: 8 g, Rfl: 6   buPROPion  (WELLBUTRIN  SR) 200 MG 12 hr tablet, Take 200 mg by mouth in the morning., Disp: , Rfl:    chlordiazePOXIDE (LIBRIUM) 5 MG capsule, Take 10 mg by mouth at bedtime., Disp: , Rfl:    desvenlafaxine  (PRISTIQ ) 50 MG 24 hr tablet, Take 50 mg by mouth every morning., Disp: , Rfl:    furosemide  (LASIX ) 40 MG tablet,  Take 1 tablet (40 mg total) by mouth daily., Disp: 90 tablet, Rfl: 3   Multiple Vitamin (MULTIVITAMIN) tablet, Take 1 tablet by mouth daily., Disp: 30 tablet, Rfl: 3   Omega-3 Fatty Acids (OMEGA-3 FISH OIL PO), Take 1 capsule by mouth every evening. , Disp: , Rfl:    pravastatin  (PRAVACHOL ) 40 MG tablet, Take 1 tablet (40 mg total) by mouth daily., Disp: 90 tablet, Rfl: 3   rivaroxaban  (XARELTO ) 20 MG TABS tablet, TAKE 1 TABLET EVERY DAY WITH SUPPER, Disp: 90 tablet, Rfl: 3   semaglutide -weight management (WEGOVY ) 0.5 MG/0.5ML SOAJ SQ injection, Inject 0.5 mg into the skin once a week., Disp: 2 mL, Rfl: 5   sildenafil  (REVATIO ) 20 MG tablet, Take 1 tablet (20 mg total) by mouth 3 (three) times daily., Disp: 90 tablet, Rfl: 1   spironolactone  (ALDACTONE ) 25 MG tablet, Take 1 tablet (25 mg total) by mouth daily., Disp: 90 tablet, Rfl: 3   traZODone  (DESYREL ) 50 MG tablet, Take 100 mg by mouth at bedtime., Disp: , Rfl:   Observations/Objective: There were no vitals filed for this visit. Physical Exam  Assessment and Plan: Assessment & Plan Mixed incontinence See entry from 11/2023.  Severe symptoms interfering with quality of life and contributing to worsening depression.  She recalls Dr. Consuela, her urologist (last visit over a year ago) sharing with her that there was little else that could be done for her symptoms.  She desires referral to urogynecologist. Orders:   Ambulatory referral to Urogynecology  Chronic hypoxic respiratory failure, on home oxygen  therapy (HCC) A previous order had been  placed for portable oxygen  and she has not had any communication-I will follow-up with our referral coordinator.    I'll see [name] in 6 m at the latest; she is also seen regularly by pulmonology and cardiology.     Follow Up Instructions: No follow-ups on file.     The patient was advised to call back or seek an in-person evaluation if the symptoms worsen or if the condition fails to  improve as anticipated.   Mliss Arlean Pouch, MD

## 2024-01-25 ENCOUNTER — Telehealth: Payer: Self-pay

## 2024-01-25 DIAGNOSIS — N3946 Mixed incontinence: Secondary | ICD-10-CM

## 2024-01-25 NOTE — Telephone Encounter (Signed)
 Ms. Fenster called with the help of a social worker to request a referral for urogynecology.  She says she saw a urologist at [facility] and she doesn't feel like they were very helpful.  She would like to see urogynecology instead.  She would like to go to whatever clinic is closest to [place].  Referral placed.

## 2024-01-25 NOTE — Assessment & Plan Note (Addendum)
 A previous order had been placed for portable oxygen  and she has not had any communication-I will follow-up with our referral coordinator.    I'll see [name] in 6 m at the latest; she is also seen regularly by pulmonology and cardiology.

## 2024-01-25 NOTE — Assessment & Plan Note (Addendum)
 See entry from 11/2023.  Severe symptoms interfering with quality of life and contributing to worsening depression.  She recalls Dr. Consuela, her urologist (last visit over a year ago) sharing with her that there was little else that could be done for her symptoms.  She desires referral to urogynecologist. Orders:   Ambulatory referral to Urogynecology

## 2024-02-03 ENCOUNTER — Telehealth

## 2024-02-03 NOTE — Progress Notes (Signed)
 Was wanting to discuss referral changes and earlier appt.  [name] -will contact office of referral for this.

## 2024-02-04 NOTE — Telephone Encounter (Signed)
 NFN

## 2024-02-08 ENCOUNTER — Ambulatory Visit: Admitting: Urology

## 2024-02-11 ENCOUNTER — Other Ambulatory Visit: Payer: Self-pay | Admitting: Internal Medicine

## 2024-02-11 DIAGNOSIS — J9611 Chronic respiratory failure with hypoxia: Secondary | ICD-10-CM

## 2024-02-15 ENCOUNTER — Ambulatory Visit: Admitting: Urology

## 2024-02-15 VITALS — BP 152/89 | HR 117 | Ht 68.0 in | Wt 285.0 lb

## 2024-02-15 DIAGNOSIS — N3946 Mixed incontinence: Secondary | ICD-10-CM

## 2024-02-15 NOTE — Progress Notes (Signed)
 02/15/2024 11:08 AM   Mychael Velia Shed 08-09-1945 [id number]  Referring provider: Starla Harland BROCKS, MD [address] [place],  [place] [address]  Chief Complaint  Patient presents with   Mixed incontinence    HPI: I followed the patient in [place].  She had mixed incontinence but primarily urge incontinence and high-volume bedwetting soaking 3 pads a day.  Well supported bladder neck and a negative stress test.  She had cystitis cystica mild on cystoscopy and was given trimethoprim .  Maximum bladder capacity 300 mL.  Bladder was overactive.  No stress incontinence with high valsalva pressures about 100 cm of water.  Was on a combination of oxybutynin  and Myrbetriq .  Infection free on trimethoprim .  She is on CPAP with oxygenation issues.  I felt Botox and InterStim based on comorbidities were ideal for her.  Has been offered PTNS.  Numerous times I felt it difficult to reach her treatment goal with circular conversation.  She has been frustrated in the past.  She was doing much better on oxybutynin  and Myrbetriq  combination and trimethoprim  with no flooding episodes.  She was hoping to get a PureWick device in the past for high-volume bedwetting.  She stopped all 3 medications noted.  Prescription for PureWick sent and given Gemtesa.  I reviewed percutaneous tibial nerve stimulation and Botox and she takes blood thinners.  She states she cannot stop blood thinners for any reason even a colonoscopy and therefore I did not bring up InterStim either.  I put her back on Macrodantin and gave her Vesicare and she could not afford the $50 for the ankle treatments.  The last time I saw her was May 2024.  Incontinence persisting.  Clinically not infected.  She is no longer taking Vesicare or once a day antibiotic.   PMH: Past Medical History:  Diagnosis Date   Antiphospholipid antibody syndrome 12/18/2022   Arthritis    osteoarthritis , kness.   Chronic hypoxemic respiratory failure  (HCC) 11/18/2013   8/26 CTA >>> extensive b/l PE's with evidence of right heart strain c/w at least submassive PE.  8/26 Echo >>> EF 55%, grade 1 diastolic dysfx, mod/severe RV dilation with mod RV systolic dysfx, PAS 51 mmHg  8/27 LE Dopplers >>>negative 01/2014 Echo> EF 60%, RV upper limits of normal, cannot estimate RVSP    Chronic hypoxic respiratory failure, on home oxygen  therapy (HCC)    Chronic kidney disease    Renal insuffucinecy, cr-1.33 in 10/2005.   Class 3 severe obesity with serious comorbidity and body mass index (BMI) of 45.0 to 49.9 in adult [facility]) 01/19/2006            Depression    followed by Dr. Vincente   Diverticulosis of colon    Embolism and thrombosis of unspecified artery (HCC) 01/11/2020   Fibrocystic breast disease    History of hepatitis B    History of hypertension 01/19/2006   At one time required multiple medications.  HTN resolved with weight loss around 2022-2023.         History of pulmonary embolism 01/2002   Hyperlipidemia    Hypertension    Morbid obesity with body mass index (BMI) of 50.0 to 59.9 in adult [facility]) 01/19/2006        Neck nodule    not erythematous, movable like cyst located at the base of posterior neck on the left side(not painful), will need follow up for    Obesity    Obstructive sleep apnea  cpap    Pulmonary nodule    Stable on repeat imaging. LLL nodule 5 mm   Vasovagal near-syncope 06/28/2020   Acute episode around 10:30 am when preparing to leave today's visit.  Had been sitting in chair for 2 hours; minimal sleep night prior; last meal was popcorn and sips of water around 1 am.  I feel dizzy she noted while sitting - recheck BP not initially measurable, pulses faint, became cool and  diaphoretic and minimally verbally responsive though did not completely lose consciousness.  Denied d    Surgical History: Past Surgical History:  Procedure Laterality Date   CHOLECYSTECTOMY N/A 08/09/2015   Procedure: LAPAROSCOPIC  CHOLECYSTECTOMY WITH INTRAOPERATIVE CHOLANGIOGRAM;  Surgeon: Debby Shipper, MD;  Location: [facility] OR;  Service: General;  Laterality: N/A;   FLEXIBLE SIGMOIDOSCOPY N/A 04/29/2018   Procedure: ENID MORIN;  Surgeon: Celestia Agent, MD;  Location: [facility] ENDOSCOPY;  Service: Endoscopy;  Laterality: N/A;   GYN surgery     s/p excison of vulvar cyst 10/18/04 by dr. olam Mace- [place]   RIGHT HEART CATH N/A 07/01/2023   Procedure: RIGHT HEART CATH;  Surgeon: Zenaida Morene PARAS, MD;  Location: [facility] INVASIVE CV LAB;  Service: Cardiovascular;  Laterality: N/A;    Home Medications:  Allergies as of 02/15/2024       Reactions   Lisinopril Cough        Medication List        Accurate as of February 15, 2024 11:08 AM. If you have any questions, ask your nurse or doctor.          albuterol  108 (90 Base) MCG/ACT inhaler Commonly known as: VENTOLIN  HFA Inhale 2 puffs into the lungs every 6 (six) hours as needed for wheezing or shortness of breath.   buPROPion  200 MG 12 hr tablet Commonly known as: WELLBUTRIN  SR Take 200 mg by mouth in the morning.   chlordiazePOXIDE 5 MG capsule Commonly known as: LIBRIUM Take 10 mg by mouth at bedtime.   desvenlafaxine  50 MG 24 hr tablet Commonly known as: PRISTIQ  Take 50 mg by mouth every morning.   furosemide  40 MG tablet Commonly known as: Lasix  Take 1 tablet (40 mg total) by mouth daily.   multivitamin tablet Take 1 tablet by mouth daily.   OMEGA-3 FISH OIL PO Take 1 capsule by mouth every evening.   pravastatin  40 MG tablet Commonly known as: PRAVACHOL  Take 1 tablet (40 mg total) by mouth daily.   rivaroxaban  20 MG Tabs tablet Commonly known as: Xarelto  TAKE 1 TABLET EVERY DAY WITH SUPPER   sildenafil  20 MG tablet Commonly known as: Revatio  Take 1 tablet (20 mg total) by mouth 3 (three) times daily.   spironolactone  25 MG tablet Commonly known as: ALDACTONE  Take 1 tablet (25 mg total) by mouth daily.   traZODone  50 MG  tablet Commonly known as: DESYREL  Take 100 mg by mouth at bedtime.   Wegovy  0.5 MG/0.5ML Soaj SQ injection Generic drug: semaglutide -weight management Inject 0.5 mg into the skin once a week.        Allergies: Allergies[1]  Family History: Family History  Problem Relation Age of Onset   Hypertension Other        family history   Kidney disease Maternal Uncle    Parkinson's disease Brother    Hypertension Mother     Social History:  reports that she quit smoking about 39 years ago. Her smoking use included cigarettes. She started smoking about 59 years ago. She has a  20 pack-year smoking history. She has been exposed to tobacco smoke. She has never used smokeless tobacco. She reports that she does not drink alcohol and does not use drugs.  ROS:                                        Physical Exam: There were no vitals taken for this visit.  Constitutional:  Alert and oriented, No acute distress. HEENT: [place] AT, moist mucus membranes.  Trachea midline, no masses.   Laboratory Data: Lab Results  Component Value Date   WBC 5.2 06/10/2023   HGB 12.9 07/01/2023   HGB 13.3 07/01/2023   HCT 38.0 07/01/2023   HCT 39.0 07/01/2023   MCV 83.0 06/10/2023   PLT 209 06/10/2023    Lab Results  Component Value Date   CREATININE 1.26 (H) 11/03/2023    No results found for: PSA  No results found for: TESTOSTERONE  Lab Results  Component Value Date   HGBA1C 5.4 11/03/2023    Urinalysis    Component Value Date/Time   COLORURINE YELLOW 12/03/2016 1245   APPEARANCEUR Cloudy (A) 11/03/2023 1207   LABSPEC 1.012 12/03/2016 1245   PHURINE 6.0 12/03/2016 1245   GLUCOSEU Negative 11/03/2023 1207   GLUCOSEU NEG mg/dL 90/73/7988 7785   HGBUR NEGATIVE 12/03/2016 1245   BILIRUBINUR neg 11/24/2023 1127   BILIRUBINUR Negative 11/03/2023 1207   KETONESUR NEGATIVE 12/03/2016 1245   PROTEINUR Negative 11/24/2023 1127   PROTEINUR 1+ (A) 11/03/2023 1207    PROTEINUR NEGATIVE 12/03/2016 1245   UROBILINOGEN 0.2 11/24/2023 1127   UROBILINOGEN 1.0 10/27/2013 1828   NITRITE pos 11/24/2023 1127   NITRITE Positive (A) 11/03/2023 1207   NITRITE POSITIVE (A) 12/03/2016 1245   LEUKOCYTESUR Moderate (2+) (A) 11/24/2023 1127   LEUKOCYTESUR 2+ (A) 11/03/2023 1207    Pertinent Imaging:   Assessment & Plan: Patient has mixed incontinence but clinically primarily an overactive bladder.  She has not had urodynamics since 2020.  Conversations have been very similar over time as are her treatment goals and expectations.  The patient has a very difficult problem affecting her quality life.  Unfortunately she cannot stop her blood thinners she says for any reason.  She says she cannot afford PTNS.  She cannot have Botox or InterStim due to blood thinner issue.  In my opinion she does not need a sling.  She is waiting for a urogynecology appointment next year.  She understands they may not be able to offer anything differently.  Again conversation tends to be a little bit circular and unfortunately do not have a good option for her.  Call if urine culture positive.   1. Mixed incontinence (Primary)  - Urinalysis, Complete   No follow-ups on file.  Glendia DELENA Elizabeth, MD  [facility] [address] [place], [place] [address] [phone]     [1]  Allergies Allergen Reactions   Lisinopril Cough

## 2024-02-16 LAB — MICROSCOPIC EXAMINATION
Epithelial Cells (non renal): >10 /HPF — AB (ref 0–10)
RBC, Urine: >30 /HPF — AB (ref 0–2)
WBC, UA: >30 /HPF — AB (ref 0–5)

## 2024-02-16 LAB — URINALYSIS, COMPLETE
Glucose, UA: NEGATIVE
Nitrite, UA: POSITIVE — AB
Specific Gravity, UA: 1.03 (ref 1.005–1.030)
Urobilinogen, Ur: 2 mg/dL — ABNORMAL HIGH (ref 0.2–1.0)
pH, UA: 5.5 (ref 5.0–7.5)

## 2024-02-19 LAB — CULTURE, URINE COMPREHENSIVE

## 2024-02-22 ENCOUNTER — Ambulatory Visit: Payer: Self-pay

## 2024-02-22 DIAGNOSIS — N3946 Mixed incontinence: Secondary | ICD-10-CM

## 2024-02-22 MED ORDER — NITROFURANTOIN MACROCRYSTAL 100 MG PO CAPS: 100.0000 mg | ORAL_CAPSULE | Freq: Two times a day (BID) | ORAL | 0 refills | Status: AC

## 2024-02-28 ENCOUNTER — Other Ambulatory Visit (HOSPITAL_COMMUNITY): Payer: Self-pay | Admitting: Internal Medicine

## 2024-03-08 ENCOUNTER — Other Ambulatory Visit: Payer: Self-pay

## 2024-03-08 DIAGNOSIS — Z6841 Body Mass Index (BMI) 40.0 and over, adult: Secondary | ICD-10-CM

## 2024-03-09 MED ORDER — WEGOVY 0.5 MG/0.5ML ~~LOC~~ SOAJ: 0.5000 mg | SUBCUTANEOUS | 5 refills | Status: AC

## 2024-03-14 ENCOUNTER — Ambulatory Visit: Admitting: Urology

## 2024-03-18 ENCOUNTER — Telehealth: Payer: Self-pay

## 2024-03-18 NOTE — Telephone Encounter (Signed)
 Prior Authorization for patient (Wegovy  0.5MG /0.5ML auto-injectors) came through on cover my meds was submitted with last office notes awaiting approval or denial.  XZB:[id number]

## 2024-03-18 NOTE — Telephone Encounter (Signed)
 [name] (Key: [id number]) Rx #: [id number] Wegovy  0.5MG /0.5ML auto-injectors Form [employer] Electronic PA Form Created 2 hours ago Sent to Plan 1 hour ago Plan Response 1 hour ago Submit Clinical Questions 1 hour ago Determination Unfavorable 1 hour ago eAppeal Submitted eAppeal Determination Your prior authorization request has been denied. Complete E-Appeal Your request for prior authorization was denied, but an Electronic Appeal is available for your patient. Complete the questions in the Appeal section at the bottom of this page to pursue the appeal. For assistance, contact our support team at [phone].  Message from plan: The drug above is APPROVED for/through 03/02/2025 for up to a 30-day supply by [employer]'s Non-Formulary Exceptions Coverage Policy. You asked for the drug above for a 84 day supply. Page 6 of your Prescription Drug Guide (PDG) says that some drugs are limited to a 30-day supply. Due to this drugs cost and/or tier, your plan only allows up to a 30-day supply of this drug per fill. The 30-day supply limit on certain drugs can also apply to mail-order. You can view your PDG and see a full list of in-network pharmacies by visiting us  online or call the number on the back of your card

## 2024-03-24 ENCOUNTER — Encounter: Admitting: Internal Medicine

## 2024-03-24 NOTE — Progress Notes (Unsigned)
 " [facility] Consultation      Date: 03/24/2024,   MRN# [id number] [name] 09-26-1945  SYNOPSIS Chronic Hypoxic Resp failure Severe OSA Noncompliant CPAP     CHIEF COMPLAINT:      HISTORY OF PRESENT ILLNESS      PAST MEDICAL HISTORY   Past Medical History:  Diagnosis Date   Antiphospholipid antibody syndrome 12/18/2022   Arthritis    osteoarthritis , kness.   Chronic hypoxemic respiratory failure (HCC) 11/18/2013   8/26 CTA >>> extensive b/l PE's with evidence of right heart strain c/w at least submassive PE.  8/26 Echo >>> EF 55%, grade 1 diastolic dysfx, mod/severe RV dilation with mod RV systolic dysfx, PAS 51 mmHg  8/27 LE Dopplers >>>negative 01/2014 Echo> EF 60%, RV upper limits of normal, cannot estimate RVSP    Chronic hypoxic respiratory failure, on home oxygen  therapy (HCC)    Chronic kidney disease    Renal insuffucinecy, cr-1.33 in 10/2005.   Class 3 severe obesity with serious comorbidity and body mass index (BMI) of 45.0 to 49.9 in adult [facility]) 01/19/2006            Depression    followed by Dr. Vincente   Diverticulosis of colon    Embolism and thrombosis of unspecified artery (HCC) 01/11/2020   Fibrocystic breast disease    History of hepatitis B    History of hypertension 01/19/2006   At one time required multiple medications.  HTN resolved with weight loss around 2022-2023.         History of pulmonary embolism 01/2002   Hyperlipidemia    Hypertension    Morbid obesity with body mass index (BMI) of 50.0 to 59.9 in adult [facility]) 01/19/2006        Neck nodule    not erythematous, movable like cyst located at the base of posterior neck on the left side(not painful), will need follow up for    Obesity    Obstructive sleep apnea    cpap    Pulmonary nodule    Stable on repeat imaging. LLL nodule 5 mm   Vasovagal near-syncope 06/28/2020   Acute episode around 10:30 am when preparing to leave today's visit.  Had been  sitting in chair for 2 hours; minimal sleep night prior; last meal was popcorn and sips of water around 1 am.  I feel dizzy she noted while sitting - recheck BP not initially measurable, pulses faint, became cool and  diaphoretic and minimally verbally responsive though did not completely lose consciousness.  Denied d     SURGICAL HISTORY   Past Surgical History:  Procedure Laterality Date   CHOLECYSTECTOMY N/A 08/09/2015   Procedure: LAPAROSCOPIC CHOLECYSTECTOMY WITH INTRAOPERATIVE CHOLANGIOGRAM;  Surgeon: Debby Shipper, MD;  Location: [facility] OR;  Service: General;  Laterality: N/A;   FLEXIBLE SIGMOIDOSCOPY N/A 04/29/2018   Procedure: ENID MORIN;  Surgeon: Celestia Agent, MD;  Location: [facility]OSCOPY;  Service: Endoscopy;  Laterality: N/A;   GYN surgery     s/p excison of vulvar cyst 10/18/04 by dr. olam Mace- [place]   RIGHT HEART CATH N/A 07/01/2023   Procedure: RIGHT HEART CATH;  Surgeon: Zenaida Morene PARAS, MD;  Location: [facility] INVASIVE CV LAB;  Service: Cardiovascular;  Laterality: N/A;     FAMILY HISTORY   Family History  Problem Relation Age of Onset   Hypertension Other        family history   Kidney disease Maternal Uncle    Parkinson's disease Brother  Hypertension Mother      SOCIAL HISTORY   Social History[1]   MEDICATIONS    Home Medication:  Current Outpatient Rx   Order #: [id number] Class: Normal   Order #: [id number] Class: Historical Med   Order #: [id number] Class: Historical Med   Order #: [id number] Class: Historical Med   Order #: [id number] Class: Normal   Order #: [id number] Class: Print   Order #: [id number] Class: Historical Med   Order #: [id number] Class: Normal   Order #: [id number] Class: Normal   Order #: [id number] Class: Normal   Order #: [id number] Class: Normal   Order #: [id number] Class: Normal   Order #: [id number] Class: Historical Med    Current Medication: Current Medications[2]    ALLERGIES   Lisinopril   There were no vitals taken  for this visit.   Review of Systems: Gen:  Denies  fever, sweats, chills weight loss  HEENT: Denies blurred vision, double vision, ear pain, eye pain, hearing loss, nose bleeds, sore throat Cardiac:  No dizziness, chest pain or heaviness, chest tightness,edema, No JVD Resp:   No cough, -sputum production, -shortness of breath,-wheezing, -hemoptysis,  Other:  All other systems negative   Physical Examination:   General Appearance: No distress  EYES PERRLA, EOM intact.   NECK Supple, No JVD Pulmonary: normal breath sounds, No wheezing.  CardiovascularNormal S1,S2.  No m/r/g.   Abdomen: Benign, Soft, non-tender. Neurology UE/LE 5/5 strength, no focal deficits Ext pulses intact, cap refill intact ALL OTHER ROS ARE NEGATIVE      IMAGING    No results found.    ASSESSMENT/PLAN                MEDICATION ADJUSTMENTS/LABS AND TESTS ORDERED:    CURRENT MEDICATIONS REVIEWED AT LENGTH WITH PATIENT TODAY   Patient  satisfied with Plan of action and management. All questions answered   Follow up    I spent a total of *** minutes dedicated to the care of this patient on the date of this encounter to include pre-visit review of records, face-to-face time with the patient discussing conditions above, post visit ordering of testing, clinical documentation with the electronic health record, making appropriate referrals as documented, and communicating necessary information to the patient's healthcare team.    The Patient requires high complexity decision making for assessment and support, frequent evaluation and titration of therapies, application of advanced monitoring technologies and extensive interpretation of multiple databases.  Patient satisfied with Plan of action and management. All questions answered    Nickolas Alm Cellar, M.D.  [facility]  Medical Director [facility] [place]                 [1]  Social  History Tobacco Use   Smoking status: Former    Current packs/day: 0.00    Average packs/day: 1 pack/day for 20.0 years (20.0 ttl pk-yrs)    Types: Cigarettes    Start date: 03/08/1964    Quit date: 03/08/1984    Years since quitting: 40.0    Passive exposure: Past   Smokeless tobacco: Never  Vaping Use   Vaping status: Never Used  Substance Use Topics   Alcohol use: No    Alcohol/week: 0.0 standard drinks of alcohol   Drug use: No  [2]  Current Outpatient Medications:    albuterol  (VENTOLIN  HFA) 108 (90 Base) MCG/ACT inhaler, Inhale 2 puffs into the lungs every 6 (six) hours as needed for wheezing or shortness of breath., Disp: 8 g, Rfl: 6   buPROPion  (WELLBUTRIN  SR)  200 MG 12 hr tablet, Take 200 mg by mouth in the morning., Disp: , Rfl:    chlordiazePOXIDE (LIBRIUM) 5 MG capsule, Take 10 mg by mouth at bedtime., Disp: , Rfl:    desvenlafaxine  (PRISTIQ ) 50 MG 24 hr tablet, Take 50 mg by mouth every morning., Disp: , Rfl:    furosemide  (LASIX ) 40 MG tablet, Take 1 tablet (40 mg total) by mouth daily., Disp: 90 tablet, Rfl: 3   Multiple Vitamin (MULTIVITAMIN) tablet, Take 1 tablet by mouth daily., Disp: 30 tablet, Rfl: 3   Omega-3 Fatty Acids (OMEGA-3 FISH OIL PO), Take 1 capsule by mouth every evening. , Disp: , Rfl:    pravastatin  (PRAVACHOL ) 40 MG tablet, Take 1 tablet (40 mg total) by mouth daily., Disp: 90 tablet, Rfl: 3   rivaroxaban  (XARELTO ) 20 MG TABS tablet, TAKE 1 TABLET EVERY DAY WITH SUPPER, Disp: 90 tablet, Rfl: 3   semaglutide -weight management (WEGOVY ) 0.5 MG/0.5ML SOAJ SQ injection, Inject 0.5 mg into the skin once a week., Disp: 2 mL, Rfl: 5   sildenafil  (REVATIO ) 20 MG tablet, TAKE 1 TABLET THREE TIMES DAILY, Disp: 180 tablet, Rfl: 5   spironolactone  (ALDACTONE ) 25 MG tablet, Take 1 tablet (25 mg total) by mouth daily., Disp: 90 tablet, Rfl: 3   traZODone  (DESYREL ) 50 MG tablet, Take 100 mg by mouth at bedtime., Disp: , Rfl:   "

## 2024-03-25 ENCOUNTER — Telehealth: Payer: Self-pay | Admitting: Pulmonary Disease

## 2024-03-25 NOTE — Telephone Encounter (Unsigned)
 Copied from CRM #[id number]. Topic: Appointments - Scheduling Inquiry for Clinic >> Mar 24, 2024  1:26 PM Corean SAUNDERS wrote: Reason for CRM: Patient was scheduled for a [facility] appointment today 1/22 but unfortunately could not make it in as her car wouldn't start. Patient is now rescheduled for 3/5 and is on the wait list but asked agent to check if there anyone Dr. Tamea or Dr. Kasa could start sooner.

## 2024-04-14 ENCOUNTER — Encounter: Admitting: Student in an Organized Health Care Education/Training Program

## 2024-05-05 ENCOUNTER — Encounter: Admitting: Pulmonary Disease

## 2024-09-21 ENCOUNTER — Ambulatory Visit: Payer: Self-pay
